# Patient Record
Sex: Male | Born: 1935
Health system: Southern US, Community
[De-identification: ages and names within clinical notes are randomized; demographics above are authoritative.]

## PROBLEM LIST (undated history)

## (undated) DIAGNOSIS — H9190 Unspecified hearing loss, unspecified ear: Secondary | ICD-10-CM

## (undated) DIAGNOSIS — J449 Chronic obstructive pulmonary disease, unspecified: Secondary | ICD-10-CM

## (undated) DIAGNOSIS — Z8546 Personal history of malignant neoplasm of prostate: Secondary | ICD-10-CM

## (undated) DIAGNOSIS — R0902 Hypoxemia: Secondary | ICD-10-CM

## (undated) DIAGNOSIS — K219 Gastro-esophageal reflux disease without esophagitis: Secondary | ICD-10-CM

## (undated) DIAGNOSIS — I639 Cerebral infarction, unspecified: Secondary | ICD-10-CM

## (undated) DIAGNOSIS — M1990 Unspecified osteoarthritis, unspecified site: Secondary | ICD-10-CM

## (undated) DIAGNOSIS — C61 Malignant neoplasm of prostate: Secondary | ICD-10-CM

## (undated) DIAGNOSIS — M359 Systemic involvement of connective tissue, unspecified: Secondary | ICD-10-CM

## (undated) DIAGNOSIS — E785 Hyperlipidemia, unspecified: Secondary | ICD-10-CM

## (undated) DIAGNOSIS — G4700 Insomnia, unspecified: Secondary | ICD-10-CM

## (undated) DIAGNOSIS — C679 Malignant neoplasm of bladder, unspecified: Secondary | ICD-10-CM

## (undated) DIAGNOSIS — C3491 Malignant neoplasm of unspecified part of right bronchus or lung: Secondary | ICD-10-CM

## (undated) HISTORY — DX: Insomnia, unspecified: G47.00

## (undated) HISTORY — DX: Gastro-esophageal reflux disease without esophagitis: K21.9

## (undated) HISTORY — DX: Unspecified osteoarthritis, unspecified site: M19.90

## (undated) HISTORY — DX: Chronic obstructive pulmonary disease, unspecified: J44.9

## (undated) HISTORY — DX: Personal history of malignant neoplasm of prostate: Z85.46

---

## 1963-02-22 HISTORY — PX: APPENDECTOMY: SHX54

## 1963-02-22 HISTORY — PX: HERNIA REPAIR: SHX51

## 2000-02-22 HISTORY — PX: INSERTION PROSTATE RADIATION SEED: SUR718

## 2004-02-22 DIAGNOSIS — C61 Malignant neoplasm of prostate: Secondary | ICD-10-CM

## 2004-02-22 HISTORY — DX: Malignant neoplasm of prostate: C61

## 2004-05-27 ENCOUNTER — Ambulatory Visit: Payer: Self-pay | Admitting: Internal Medicine

## 2004-07-01 ENCOUNTER — Ambulatory Visit: Payer: Self-pay | Admitting: General Surgery

## 2006-02-10 ENCOUNTER — Ambulatory Visit: Payer: Self-pay | Admitting: Family Medicine

## 2006-05-02 ENCOUNTER — Other Ambulatory Visit: Payer: Self-pay

## 2006-05-02 ENCOUNTER — Emergency Department: Payer: Self-pay | Admitting: Emergency Medicine

## 2007-10-02 ENCOUNTER — Other Ambulatory Visit: Payer: Self-pay

## 2007-10-02 ENCOUNTER — Ambulatory Visit: Payer: Self-pay | Admitting: Urology

## 2007-10-08 ENCOUNTER — Ambulatory Visit: Payer: Self-pay | Admitting: Urology

## 2008-02-19 ENCOUNTER — Ambulatory Visit: Payer: Self-pay | Admitting: Urology

## 2008-03-13 ENCOUNTER — Ambulatory Visit: Payer: Self-pay | Admitting: Rheumatology

## 2008-04-10 ENCOUNTER — Ambulatory Visit: Payer: Self-pay | Admitting: Specialist

## 2009-09-07 ENCOUNTER — Ambulatory Visit: Payer: Self-pay | Admitting: Specialist

## 2010-02-17 ENCOUNTER — Ambulatory Visit: Payer: Self-pay | Admitting: Specialist

## 2010-06-15 ENCOUNTER — Ambulatory Visit: Payer: Self-pay | Admitting: Rheumatology

## 2013-11-01 DIAGNOSIS — Z8546 Personal history of malignant neoplasm of prostate: Secondary | ICD-10-CM | POA: Insufficient documentation

## 2014-01-03 ENCOUNTER — Inpatient Hospital Stay: Payer: Self-pay | Admitting: Internal Medicine

## 2014-01-03 LAB — BASIC METABOLIC PANEL
ANION GAP: 3 — AB (ref 7–16)
BUN: 16 mg/dL (ref 7–18)
Calcium, Total: 8.2 mg/dL — ABNORMAL LOW (ref 8.5–10.1)
Chloride: 104 mmol/L (ref 98–107)
Co2: 29 mmol/L (ref 21–32)
Creatinine: 1.13 mg/dL (ref 0.60–1.30)
EGFR (African American): >60
GLUCOSE: 100 mg/dL — AB (ref 65–99)
OSMOLALITY: 273 (ref 275–301)
POTASSIUM: 3.6 mmol/L (ref 3.5–5.1)
SODIUM: 136 mmol/L (ref 136–145)

## 2014-01-03 LAB — CBC
HCT: 39.8 % — ABNORMAL LOW (ref 40.0–52.0)
HGB: 12.9 g/dL — ABNORMAL LOW (ref 13.0–18.0)
MCH: 30.9 pg (ref 26.0–34.0)
MCHC: 32.4 g/dL (ref 32.0–36.0)
MCV: 95 fL (ref 80–100)
PLATELETS: 219 10*3/uL (ref 150–440)
RBC: 4.17 10*6/uL — ABNORMAL LOW (ref 4.40–5.90)
RDW: 15.5 % — ABNORMAL HIGH (ref 11.5–14.5)
WBC: 4.4 10*3/uL (ref 3.8–10.6)

## 2014-01-03 LAB — TROPONIN I: Troponin-I: <0.02 ng/mL

## 2014-01-04 LAB — CBC WITH DIFFERENTIAL/PLATELET
BASOS ABS: 0 10*3/uL (ref 0.0–0.1)
Basophil %: 0 %
EOS PCT: 0 %
Eosinophil #: 0 10*3/uL (ref 0.0–0.7)
HCT: 38.7 % — ABNORMAL LOW (ref 40.0–52.0)
HGB: 12.5 g/dL — ABNORMAL LOW (ref 13.0–18.0)
LYMPHS ABS: 0.4 10*3/uL — AB (ref 1.0–3.6)
Lymphocyte %: 12.1 %
MCH: 30.9 pg (ref 26.0–34.0)
MCHC: 32.4 g/dL (ref 32.0–36.0)
MCV: 96 fL (ref 80–100)
Monocyte #: 0.1 x10 3/mm — ABNORMAL LOW (ref 0.2–1.0)
Monocyte %: 1.8 %
NEUTROS PCT: 86.1 %
Neutrophil #: 2.6 10*3/uL (ref 1.4–6.5)
PLATELETS: 210 10*3/uL (ref 150–440)
RBC: 4.05 10*6/uL — AB (ref 4.40–5.90)
RDW: 15.6 % — AB (ref 11.5–14.5)
WBC: 3 10*3/uL — ABNORMAL LOW (ref 3.8–10.6)

## 2014-01-04 LAB — BASIC METABOLIC PANEL
ANION GAP: 6 — AB (ref 7–16)
BUN: 17 mg/dL (ref 7–18)
CO2: 30 mmol/L (ref 21–32)
Calcium, Total: 8.4 mg/dL — ABNORMAL LOW (ref 8.5–10.1)
Chloride: 102 mmol/L (ref 98–107)
Creatinine: 1.14 mg/dL (ref 0.60–1.30)
EGFR (African American): >60
Glucose: 121 mg/dL — ABNORMAL HIGH (ref 65–99)
Osmolality: 278 (ref 275–301)
Potassium: 4.4 mmol/L (ref 3.5–5.1)
Sodium: 138 mmol/L (ref 136–145)

## 2014-01-05 LAB — CBC WITH DIFFERENTIAL/PLATELET
BASOS ABS: 0 10*3/uL (ref 0.0–0.1)
BASOS PCT: 0 %
EOS PCT: 0 %
Eosinophil #: 0 10*3/uL (ref 0.0–0.7)
HCT: 36.3 % — ABNORMAL LOW (ref 40.0–52.0)
HGB: 12 g/dL — AB (ref 13.0–18.0)
LYMPHS PCT: 6 %
Lymphocyte #: 0.4 10*3/uL — ABNORMAL LOW (ref 1.0–3.6)
MCH: 31.2 pg (ref 26.0–34.0)
MCHC: 33.1 g/dL (ref 32.0–36.0)
MCV: 95 fL (ref 80–100)
MONO ABS: 0.2 x10 3/mm (ref 0.2–1.0)
Monocyte %: 4.2 %
Neutrophil #: 5.3 10*3/uL (ref 1.4–6.5)
Neutrophil %: 89.8 %
Platelet: 226 10*3/uL (ref 150–440)
RBC: 3.84 10*6/uL — ABNORMAL LOW (ref 4.40–5.90)
RDW: 15.3 % — AB (ref 11.5–14.5)
WBC: 5.9 10*3/uL (ref 3.8–10.6)

## 2014-02-25 DIAGNOSIS — M0589 Other rheumatoid arthritis with rheumatoid factor of multiple sites: Secondary | ICD-10-CM | POA: Diagnosis not present

## 2014-02-25 DIAGNOSIS — Z79899 Other long term (current) drug therapy: Secondary | ICD-10-CM | POA: Diagnosis not present

## 2014-03-03 DIAGNOSIS — G4700 Insomnia, unspecified: Secondary | ICD-10-CM | POA: Diagnosis not present

## 2014-03-03 DIAGNOSIS — D473 Essential (hemorrhagic) thrombocythemia: Secondary | ICD-10-CM | POA: Diagnosis not present

## 2014-03-03 DIAGNOSIS — R911 Solitary pulmonary nodule: Secondary | ICD-10-CM | POA: Diagnosis not present

## 2014-03-03 DIAGNOSIS — J441 Chronic obstructive pulmonary disease with (acute) exacerbation: Secondary | ICD-10-CM | POA: Diagnosis not present

## 2014-03-08 DIAGNOSIS — J449 Chronic obstructive pulmonary disease, unspecified: Secondary | ICD-10-CM | POA: Diagnosis not present

## 2014-03-08 DIAGNOSIS — J439 Emphysema, unspecified: Secondary | ICD-10-CM | POA: Diagnosis not present

## 2014-03-24 ENCOUNTER — Ambulatory Visit: Payer: Self-pay | Admitting: Specialist

## 2014-03-24 DIAGNOSIS — R918 Other nonspecific abnormal finding of lung field: Secondary | ICD-10-CM | POA: Diagnosis not present

## 2014-03-24 DIAGNOSIS — I2510 Atherosclerotic heart disease of native coronary artery without angina pectoris: Secondary | ICD-10-CM | POA: Diagnosis not present

## 2014-04-08 DIAGNOSIS — J449 Chronic obstructive pulmonary disease, unspecified: Secondary | ICD-10-CM | POA: Diagnosis not present

## 2014-04-08 DIAGNOSIS — J439 Emphysema, unspecified: Secondary | ICD-10-CM | POA: Diagnosis not present

## 2014-04-29 DIAGNOSIS — M0579 Rheumatoid arthritis with rheumatoid factor of multiple sites without organ or systems involvement: Secondary | ICD-10-CM | POA: Diagnosis not present

## 2014-04-29 DIAGNOSIS — Z79899 Other long term (current) drug therapy: Secondary | ICD-10-CM | POA: Diagnosis not present

## 2014-05-06 DIAGNOSIS — R700 Elevated erythrocyte sedimentation rate: Secondary | ICD-10-CM | POA: Diagnosis not present

## 2014-05-06 DIAGNOSIS — R938 Abnormal findings on diagnostic imaging of other specified body structures: Secondary | ICD-10-CM | POA: Diagnosis not present

## 2014-05-06 DIAGNOSIS — M0579 Rheumatoid arthritis with rheumatoid factor of multiple sites without organ or systems involvement: Secondary | ICD-10-CM | POA: Diagnosis not present

## 2014-05-07 DIAGNOSIS — J449 Chronic obstructive pulmonary disease, unspecified: Secondary | ICD-10-CM | POA: Diagnosis not present

## 2014-05-07 DIAGNOSIS — J439 Emphysema, unspecified: Secondary | ICD-10-CM | POA: Diagnosis not present

## 2014-05-14 DIAGNOSIS — R0602 Shortness of breath: Secondary | ICD-10-CM | POA: Diagnosis not present

## 2014-05-14 DIAGNOSIS — R0902 Hypoxemia: Secondary | ICD-10-CM | POA: Diagnosis not present

## 2014-05-14 DIAGNOSIS — R918 Other nonspecific abnormal finding of lung field: Secondary | ICD-10-CM | POA: Diagnosis not present

## 2014-05-14 DIAGNOSIS — J449 Chronic obstructive pulmonary disease, unspecified: Secondary | ICD-10-CM | POA: Diagnosis not present

## 2014-05-20 DIAGNOSIS — D649 Anemia, unspecified: Secondary | ICD-10-CM | POA: Diagnosis not present

## 2014-05-20 DIAGNOSIS — J449 Chronic obstructive pulmonary disease, unspecified: Secondary | ICD-10-CM | POA: Diagnosis not present

## 2014-05-20 DIAGNOSIS — R911 Solitary pulmonary nodule: Secondary | ICD-10-CM | POA: Diagnosis not present

## 2014-05-20 DIAGNOSIS — K210 Gastro-esophageal reflux disease with esophagitis: Secondary | ICD-10-CM | POA: Diagnosis not present

## 2014-06-06 ENCOUNTER — Other Ambulatory Visit: Payer: Self-pay | Admitting: Specialist

## 2014-06-06 DIAGNOSIS — R911 Solitary pulmonary nodule: Secondary | ICD-10-CM

## 2014-06-07 DIAGNOSIS — J439 Emphysema, unspecified: Secondary | ICD-10-CM | POA: Diagnosis not present

## 2014-06-07 DIAGNOSIS — J449 Chronic obstructive pulmonary disease, unspecified: Secondary | ICD-10-CM | POA: Diagnosis not present

## 2014-06-14 NOTE — Discharge Summary (Signed)
PATIENT NAME:  Collin Gonzales, Collin Gonzales MR#:  [id number] DATE OF BIRTH:  March 02, 1935  ADMITTING DIAGNOSIS: Chronic obstructive pulmonary disease exacerbation.   DISCHARGE DIAGNOSES: 1.  Acute on chronic respiratory failure due to chronic obstructive pulmonary disease exacerbation, now patient will be on continuous oxygen therapy at 2 liters of oxygen through nasal cannula.  2.  Right middle and right upper lobe nodularity on CT scan of the chest. Three month followup was recommended.  3.  Chronic obstructive pulmonary disease exacerbation.  4.  Acute bronchitis.  5.  Leukopenia.  6. Ongoing tobacco abuse. 7.  History of stroke. 8.  History of prostate carcinoma. 9.  Rheumatoid arthritis.   DISCHARGE CONDITION: Stable.   DISCHARGE MEDICATIONS: The patient is to continue methotrexate 2.5 mg 9 tablets once weekly on Tuesdays, omeprazole 40 mg p.o. daily, multivitamins once daily, albuterol 2 puffs 4 times daily as needed, prednisone taper 60 mg p.o. once on 01/07/2014, then taper by 10 mg every 2 days until stopped, albuterol-ipratropium nebulizers 1 inhalation 6 times daily as needed.  Fluticasone-salmeterol 250/50 on puff twice daily, tiotropium 1 inhalation once daily, guaifenesin 600 mg p.o. twice daily, nicotine oral inhaler 1 inhalation every 1-2 hours as needed, nicotine transdermal patch 14 mg topically daily, Levaquin 750 mg p.o. daily for 4 more days. Home oxygen with portable tank at 2 liters of oxygen through nasal cannula continuously.  DIET:  Regular consistency.    ACTIVITY LIMITATIONS: As tolerated.   FOLLOWUP APPOINTMENT:  With Dr. Doreene Nest 2 days after discharge. The patient was also advised to stop smoking.  CONSULTANTS: Care management, social work.  RADIOLOGIC STUDIES: Chest x-ray PA and lateral 01/03/2014, showing COPD changes with bibasilar atelectasis versus scarring. No definite acute abnormalities were found. CT scan of the chest with IV contrast to rule out pulmonary  embolism, 01/05/2014, was negative for pulmonary embolus, severe emphysema with suspicious areas in the right upper lung. Findings could be related to scarring, but cannot exclude slowly enlarging neoplasm in these locations. Recommend close observation of these areas with 3 month followup CT scan of the chest. In addition, there are a few nodules or a bilobed nodule in the right middle lobe which is at least partially calcified and could represent calcified granuloma, mild wall thickening in the distal esophagus is nonspecific, esophagitis cannot be excluded. Stable wall thickening along the left side of the aortic arch which is unchanged.   HISTORY: The patient is a 79 year old African American male with history of smoking, who presents to the hospital with complaints of shortness of breath as well as cough. Please refer to Dr. Loistine Chance admission note on 01/03/2014. On arrival to the hospital, the patient with complaining of cough with clear phlegm and he was noted to be wheezing and had decreased exercise tolerance.  His vital signs on the day of arrival, temperature was 98.5, pulse was 71. Respirate rate was 20, blood pressure 141/82, saturation was 95% on 2 liters of oxygen through nasal cannula. Physical exam found him to be slightly tachypneic, diffuse loud wheezes as well as rhonchi were found to be throughout and poor air movement, but not overt respiratory distress. Otherwise, physical exam was unremarkable.   LABORATORY DATA:  Done on arrival to the hospital, BMP, glucose level of 100, otherwise unremarkable. CO2 level was 29. Troponin was less than 0.02. White blood cell count was normal at 4.4, hemoglobin was 12.9, platelet count was 219,000.   EKG showed normal sinus rhythm at 75 beats per minute, nonspecific  T wave abnormality, but no acute ST-T changes were noted. Chest x-ray was unremarkable except for mild bibasilar atelectasis.   HOSPITAL COURSE The patient was admitted to the hospital for  further evaluation. He was initiated on antibiotics, steroids, inhalation nebulizer therapy, as well as oxygen. With this, his condition improved; however, he still continued to have significant hypoxia with oxygen saturations dropping down to 85%-83% on room air on exertion. CT scan of the chest was performed with IV contrast to rule out pulmonary embolism which showed no pulmonary embolism; however, severe emphysema and some pulmonary nodularity. The patient was advised to continue antibiotic therapy as well as nebulizers and inhalers and he was felt to have acute on likely chronic respiratory failure and chronic hypoxia and so oxygen therapy was initiated for him at home. The patient was advised to continue 2 liters of oxygen per nasal cannula and follow up with his primary physician for further recommendations.  In regard to lung nodularity, a CT scan of the chest is recommended to be repeated in 3 months after discharge.   The patient was noted to be leukopenic while in the hospital with a white blood cell count dropping down briefly on 01/04/2014 to 3.0 and a hemoglobin level of 12.5, as well as platelet count of 210,000.  The patient's white blood cell count recovered the next day back to normal. The patient's absolute neutrophil count was normal despite him being leukopenic overall with absolute neutrophil count being 2.6. The lymphocyte count was found to be low at 0.4. It is unclear why the patient had this episode of leukopenia, but the patient may benefit from HIV testing as outpatient which was not done during this admission.  Today on 01/06/2014, the patient is felt to be stable to be discharged home. On the day of discharge, temperature is 98.6, pulse was 75, respirations were 18, blood pressure 131/80, saturation was 90% on 2 liters of oxygen through nasal cannula on exertion and 82%  with exertion on room air and 92% on room air at rest.   TIME SPENT: 40 minutes with the  patient.   ____________________________ Collin Grist, MD rv:LT D: 01/06/2014 20:18:00 ET T: 01/06/2014 20:53:04 ET JOB#: [id number]  cc: DR. Jeronimo Greaves, MD, <Dictator>  Elery Cadenhead MD ELECTRONICALLY SIGNED 01/20/2014 20:59

## 2014-06-14 NOTE — H&P (Signed)
PATIENT NAME:  Collin Gonzales, Collin Gonzales MR#:  [id number] DATE OF BIRTH:  02-25-1935  DATE OF ADMISSION:  01/03/2014  PRIMARY CARE PHYSICIAN: Dr. Manuella Ghazi with [facility].   PRIMARY PULMONOLOGIST: Dr. Raul Del.   REFERRING EMERGENCY ROOM PHYSICIAN: Dr. Thomasene Lot.   CHIEF COMPLAINT: Shortness of breath.   HISTORY OF PRESENT ILLNESS: This very pleasant 79 year old man with past medical history of COPD and rheumatoid arthritis presents with 1 week of progressive shortness of breath and malaise. He denies fevers or chills. No nausea, vomiting, or diarrhea. He has had a cough with clear sputum. He has noticed wheezing and decreased exercise tolerance. He cannot walk more than 5 feet at this point without becoming acutely short of breath. He does not use oxygen at home. He does continue to smoke cigarettes.   PAST MEDICAL HISTORY: 1.  Chronic obstructive pulmonary disease.  2.  History of CVA in 2000 with residual left foot spasm, otherwise no other defects.  3.  Prostate cancer, in remission.  4.  Rheumatoid arthritis.   SOCIAL HISTORY: The patient lives alone. He continues to smoke 1 pack of cigarettes per day. He has about a 50 pack-year smoking history. He does not drink alcohol or use any illicit substances. He does not use a cane or walker for ambulation. He performs all of his own activities of daily living.   FAMILY HISTORY: Negative for coronary artery disease or stroke. He has a sister with breast cancer.   ALLERGIES: No known allergies.   HOME MEDICATIONS: 1.  Omeprazole 40 mg daily.  2.  Multivitamin 1 tablet daily.  3.  Methotrexate 2.5 mg, 9 tablets once a week.  4.  Albuterol CFC free 90 mcg/inhalation aerosol 2 puffs inhaled 4 times a day as needed for shortness of breath.   REVIEW OF SYSTEMS: GENERAL: Negative for fevers, chills, or weight change. Positive for weakness and fatigue.  HEENT: No change in hearing or vision. No pain in eyes or ears. No pain in the throat or  difficulty swallowing.  PULMONARY: Positive for shortness of breath, wheezing, cough productive of sputum. No hemoptysis. No pleuritic type chest pain. Positive for history of COPD.   CARDIOVASCULAR: No chest pain, edema, palpitations, syncope.  GASTROINTESTINAL: No nausea, vomiting, diarrhea. No abdominal pain.  GENITOURINARY: No dysuria or frequency.  MUSCULOSKELETAL: No new pain in the neck, back, shoulders, knees, or hips. No swollen tender joints. No cramping or gout.  NEUROLOGIC: No confusion, dementia, headache, seizure. He does have a remote history of CVA.  PSYCHIATRIC: No uncontrolled depression or anxiety.   PHYSICAL EXAMINATION: VITAL SIGNS: Temperature 98.5, pulse 71, respirations 20, blood pressure 141/82, pulse oximetry 95% on 2 liters nasal cannula.  GENERAL: No acute distress, resting in the exam bed, slightly tachypneic.   HEENT: Pupils equal, round, and reactive to light. Extraocular motion intact. Conjunctivae clear. Oral mucous membranes are dry. Good dentition. Posterior oropharynx is clear with no edema or exudate. He has shotty anterior cervical lymphadenopathy bilaterally. Trachea is midline. Thyroid is nontender.  PULMONARY: Diffuse loud wheezes and rhonchi throughout. Poor air movement. No overt respiratory distress.  CARDIOVASCULAR: Heart sounds are very distant. Regular rate and rhythm. No murmurs, rubs, or gallops. No peripheral edema. Peripheral pulses are 1+.  ABDOMEN: Soft, nontender, nondistended. Bowel sounds are normal.  MUSCULOSKELETAL: No joint effusions. Range of motion is normal. Strength is 5/5 throughout.  NEUROLOGIC: Cranial nerves II through XII are intact. Strength and sensation intact, nonfocal.  PSYCHIATRIC: The patient is alert and  oriented x 4. Has good insight into his clinical condition. No signs of uncontrolled depression or anxiety.   LABORATORY DATA: Sodium 136, potassium 3.6, chloride 104, bicarbonate 29, BUN 16, creatinine 1.13, glucose 100.  Troponin less than 0.02. White blood cells 4.4, hemoglobin 12.9, platelets 219,000, MCV 95.   IMAGING: Chest x-ray shows COPD changes with bibasilar atelectasis versus scarring. No definite acute abnormality. Also bone mineralization.   ASSESSMENT AND PLAN: 1.  Chronic obstructive pulmonary disease exacerbation: We will admit and treat with standard regimen of Solu-Medrol 60 mg IV daily, albuterol and ipratropium nebulizers, doxycycline 100 mg b.i.d. He will need to be assessed for need for home oxygen. He may need maintenance inhalers as well as p.r.n. albuterol upon discharge. We will likely discharge on Advair and/or Spiriva.   2.  Acute respiratory failure with hypoxia: Requiring 2 liters nasal cannula to maintain oxygen saturations above 90. Continue supplemental oxygen. We will need to be assessed prior to discharge for home oxygen need.  3.  History of cerebrovascular accident: No signs of neurologic deficit at this point. The patient may need to be on aspirin daily for secondary risk prevention.  4.  Rheumatoid arthritis: We will hold methotrexate while inpatient.  5.  Deep vein thrombosis prophylaxis with heparin, gastrointestinal prophylaxis with pantoprazole.  6.  Disposition: I have discussed code status with this patient and he would like to be a full code.   TIME SPENT ON ADMISSION: 40 minutes.   ____________________________ Earleen Newport. Volanda Napoleon, MD cpw:at D: 01/03/2014 20:19:51 ET T: 01/03/2014 20:57:25 ET JOB#: [id number]  cc: Earleen Newport. Volanda Napoleon, MD, <Dictator> Aldean Jewett MD ELECTRONICALLY SIGNED 01/09/2014 13:19

## 2014-07-06 DIAGNOSIS — J439 Emphysema, unspecified: Secondary | ICD-10-CM | POA: Diagnosis not present

## 2014-07-06 DIAGNOSIS — J449 Chronic obstructive pulmonary disease, unspecified: Secondary | ICD-10-CM | POA: Diagnosis not present

## 2014-07-07 DIAGNOSIS — J449 Chronic obstructive pulmonary disease, unspecified: Secondary | ICD-10-CM | POA: Diagnosis not present

## 2014-07-07 DIAGNOSIS — J439 Emphysema, unspecified: Secondary | ICD-10-CM | POA: Diagnosis not present

## 2014-07-18 DIAGNOSIS — R0609 Other forms of dyspnea: Secondary | ICD-10-CM | POA: Diagnosis not present

## 2014-07-18 DIAGNOSIS — J432 Centrilobular emphysema: Secondary | ICD-10-CM | POA: Diagnosis not present

## 2014-07-18 DIAGNOSIS — R0902 Hypoxemia: Secondary | ICD-10-CM | POA: Diagnosis not present

## 2014-08-06 DIAGNOSIS — R700 Elevated erythrocyte sedimentation rate: Secondary | ICD-10-CM | POA: Diagnosis not present

## 2014-08-06 DIAGNOSIS — M0579 Rheumatoid arthritis with rheumatoid factor of multiple sites without organ or systems involvement: Secondary | ICD-10-CM | POA: Diagnosis not present

## 2014-08-07 DIAGNOSIS — J439 Emphysema, unspecified: Secondary | ICD-10-CM | POA: Diagnosis not present

## 2014-08-07 DIAGNOSIS — J449 Chronic obstructive pulmonary disease, unspecified: Secondary | ICD-10-CM | POA: Diagnosis not present

## 2014-08-20 ENCOUNTER — Ambulatory Visit (INDEPENDENT_AMBULATORY_CARE_PROVIDER_SITE_OTHER): Payer: Commercial Managed Care - HMO | Admitting: Family Medicine

## 2014-08-20 ENCOUNTER — Encounter: Payer: Self-pay | Admitting: Family Medicine

## 2014-08-20 VITALS — BP 128/68 | HR 89 | Temp 98.6°F | Ht 68.0 in | Wt 136.0 lb

## 2014-08-20 DIAGNOSIS — Z0000 Encounter for general adult medical examination without abnormal findings: Secondary | ICD-10-CM | POA: Insufficient documentation

## 2014-08-20 DIAGNOSIS — F5104 Psychophysiologic insomnia: Secondary | ICD-10-CM

## 2014-08-20 DIAGNOSIS — C3490 Malignant neoplasm of unspecified part of unspecified bronchus or lung: Secondary | ICD-10-CM | POA: Insufficient documentation

## 2014-08-20 DIAGNOSIS — G4700 Insomnia, unspecified: Secondary | ICD-10-CM | POA: Diagnosis not present

## 2014-08-20 DIAGNOSIS — J441 Chronic obstructive pulmonary disease with (acute) exacerbation: Secondary | ICD-10-CM | POA: Insufficient documentation

## 2014-08-20 DIAGNOSIS — E785 Hyperlipidemia, unspecified: Secondary | ICD-10-CM | POA: Insufficient documentation

## 2014-08-20 DIAGNOSIS — M069 Rheumatoid arthritis, unspecified: Secondary | ICD-10-CM | POA: Insufficient documentation

## 2014-08-20 DIAGNOSIS — J449 Chronic obstructive pulmonary disease, unspecified: Secondary | ICD-10-CM | POA: Insufficient documentation

## 2014-08-20 DIAGNOSIS — G479 Sleep disorder, unspecified: Secondary | ICD-10-CM | POA: Insufficient documentation

## 2014-08-20 MED ORDER — TRAZODONE HCL 50 MG PO TABS: 50.0000 mg | ORAL_TABLET | Freq: Every day | ORAL | Status: DC

## 2014-08-20 NOTE — Progress Notes (Deleted)
Name: Collin Gonzales   MRN: [id number]    DOB: 1935/06/11   Date:08/20/2014       Progress Note  Subjective  Chief Complaint  Chief Complaint  Patient presents with  . Annual Exam    checkup    HPI  Patient is here today for a Medicare Wellness Visit:  The patient has been in otherwise good general health and voices no acute concerns today.   No problem-specific assessment & plan notes found for this encounter.   Past Medical History  Diagnosis Date  . Arthritis   . GERD (gastroesophageal reflux disease)     History reviewed. No pertinent past surgical history.  History reviewed. No pertinent family history.  History   Social History  . Marital Status: Single    Spouse Name: N/A  . Number of Children: N/A  . Years of Education: N/A   Occupational History  . Not on file.   Social History Main Topics  . Smoking status: Former Research scientist (life sciences)  . Smokeless tobacco: Not on file  . Alcohol Use: No  . Drug Use: No  . Sexual Activity: Not on file   Other Topics Concern  . Not on file   Social History Narrative  . No narrative on file     Current outpatient prescriptions:  .  albuterol (PROAIR HFA) 108 (90 BASE) MCG/ACT inhaler, Inhale into the lungs., Disp: , Rfl:  .  Fluticasone-Salmeterol (ADVAIR DISKUS) 250-50 MCG/DOSE AEPB, Inhale into the lungs., Disp: , Rfl:  .  tiotropium (SPIRIVA HANDIHALER) 18 MCG inhalation capsule, Place into inhaler and inhale., Disp: , Rfl:  .  traZODone (DESYREL) 50 MG tablet, Take by mouth., Disp: , Rfl:  .  B COMPLEX-C PO, Take by mouth., Disp: , Rfl:  .  methotrexate 2.5 MG tablet, Take by mouth., Disp: , Rfl:  .  omeprazole (PRILOSEC) 40 MG capsule, Take by mouth., Disp: , Rfl:  .  sildenafil (VIAGRA) 50 MG tablet, Take by mouth., Disp: , Rfl:   Allergies  Allergen Reactions  . Hydroxychloroquine Sulfate     Other reaction(s): Dizziness    Fall Risk: Fall Risk  08/20/2014  Falls in the past year? No    Depression  screen PHQ 2/9 08/20/2014  Decreased Interest 0  Down, Depressed, Hopeless 0  PHQ - 2 Score 0   Functional Status Survey: Is the patient deaf or have difficulty hearing?: No Does the patient have difficulty seeing, even when wearing glasses/contacts?: No Does the patient have difficulty concentrating, remembering, or making decisions?: No Does the patient have difficulty walking or climbing stairs?: No Does the patient have difficulty dressing or bathing?: No Does the patient have difficulty doing errands alone such as visiting a doctor's office or shopping?: No   ROS  CONSTITUTIONAL: No significant weight changes, fever, chills, weakness or fatigue.  HEENT:  - Eyes: No visual changes.  - Ears: No auditory changes. No pain.  - Nose: No sneezing, congestion, runny nose. - Throat: No sore throat. No changes in swallowing. SKIN: No rash or itching.  CARDIOVASCULAR: No chest pain, chest pressure or chest discomfort. No palpitations or edema.  RESPIRATORY: No shortness of breath, cough or sputum.  GASTROINTESTINAL: No anorexia, nausea, vomiting. No changes in bowel habits. No abdominal pain or blood.  GENITOURINARY: No dysuria. No frequency. No discharge.  NEUROLOGICAL: No headache, dizziness, syncope, paralysis, ataxia, numbness or tingling in the extremities. No memory changes. No change in bowel or bladder control.  MUSCULOSKELETAL: No joint pain.  No muscle pain. HEMATOLOGIC: No anemia, bleeding or bruising.  LYMPHATICS: No enlarged lymph nodes.  PSYCHIATRIC: No change in mood. No change in sleep pattern.  ENDOCRINOLOGIC: No reports of sweating, cold or heat intolerance. No polyuria or polydipsia.   Objective  Filed Vitals:   08/20/14 1405  BP: 128/68  Pulse: 89  Temp: 98.6 F (37 C)  TempSrc: Oral  Height: '5\' 8"'$  (1.727 m)  Weight: 136 lb (61.689 kg)  SpO2: 90%   Body mass index is 20.68 kg/(m^2).  Physical Exam  Constitutional: Patient appears well-developed and  well-nourished. In no distress.  HEENT:  - Head: Normocephalic and atraumatic.  - Ears: Bilateral TMs gray, no erythema or effusion - Nose: Nasal mucosa moist - Mouth/Throat: Oropharynx is clear and moist. No tonsillar hypertrophy or erythema. No post nasal drainage.  - Eyes: Conjunctivae clear, EOM movements normal. PERRLA. No scleral icterus.  Neck: Normal range of motion. Neck supple. No JVD present. No thyromegaly present.  Cardiovascular: Normal rate, regular rhythm and normal heart sounds.  No murmur heard.  Pulmonary/Chest: Effort normal and breath sounds normal. No respiratory distress. Musculoskeletal: Normal range of motion bilateral UE and LE, no joint effusions. Peripheral vascular: Bilateral LE no edema. Neurological: CN II-XII grossly intact with no focal deficits. Alert and oriented to person, place, and time. Coordination, balance, strength, speech and gait are normal.  Skin: Skin is warm and dry. No rash noted. No erythema.  Psychiatric: Patient has a normal mood and affect. Behavior is normal in office today. Judgment and thought content normal in office today.   Assessment & Plan  Functional ability/safety issues: No Issues Hearing issues: Addressed  Activities of daily living: Discussed Home safety issues: No Issues  End Of Life Planning: Offered verbal information regarding advanced directives, healthcare power of attorney.  Preventative care, Health maintenance, Preventative health measures discussed.  Preventative screenings discussed today: lab work, colonoscopy, PSA.  Men age 56 to 14 years if ever smoked recommended to get a one time AAA ultrasound screening exam.  Low Dose CT Chest recommended if Age 19-80 years, 30 pack-year currently smoking OR have quit w/in 15years.   Lifestyle risk factor issued reviewed: Diet, exercise, weight management, advised patient smoking is not healthy, nutrition/diet.  Preventative health measures discussed (5-10 year  plan).  Reviewed and recommended vaccinations: - Pneumovax  - Prevnar  - Annual Influenza - Zostavax - Tdap   Depression screening: Done Fall risk screening: Done Discuss ADLs/IADLs: Done  Current medical providers: See HPI  Other health risk factors identified this visit: No other issues Cognitive impairment issues: None identified  All above discussed with patient. Appropriate education, counseling and referral will be made based upon the above.

## 2014-08-20 NOTE — Progress Notes (Signed)
Name: Collin Gonzales   MRN: [id number]    DOB: [birth year]   Date:08/20/2014       Progress Note  Subjective  Chief Complaint  Chief Complaint  Patient presents with  . Insomnia    HPI Pt. Is here for follow up of insomnia. Symptoms include difficulty falling asleep and waking up in the middle of the night. He is on Trazodone 50 mg at bedtime, which seems to be helping with symptoms of insomnia. No side effects noted. Requesting refill for trazodone   Past Medical History  Diagnosis Date  . GERD (gastroesophageal reflux disease)   . COPD (chronic obstructive pulmonary disease)   . Arthritis     rheumatoid arthritis  . Insomnia     Past Surgical History  Procedure Laterality Date  . Appendectomy  1965  . Hernia repair  1965  . Biopsy breast Right 2006    Family History  Problem Relation Age of Onset  . Cancer Mother   . Cancer Sister   . Cancer Brother     History   Social History  . Marital Status: Single    Spouse Name: N/A  . Number of Children: N/A  . Years of Education: N/A   Occupational History  . Not on file.   Social History Main Topics  . Smoking status: Former Research scientist (life sciences)  . Smokeless tobacco: Not on file  . Alcohol Use: No  . Drug Use: No  . Sexual Activity: Not on file   Other Topics Concern  . Not on file   Social History Narrative  . No narrative on file     Current outpatient prescriptions:  .  albuterol (PROAIR HFA) 108 (90 BASE) MCG/ACT inhaler, Inhale into the lungs., Disp: , Rfl:  .  Fluticasone-Salmeterol (ADVAIR DISKUS) 250-50 MCG/DOSE AEPB, Inhale into the lungs., Disp: , Rfl:  .  tiotropium (SPIRIVA HANDIHALER) 18 MCG inhalation capsule, Place into inhaler and inhale., Disp: , Rfl:  .  traZODone (DESYREL) 50 MG tablet, Take by mouth., Disp: , Rfl:  .  B COMPLEX-C PO, Take by mouth., Disp: , Rfl:  .  methotrexate 2.5 MG tablet, Take by mouth., Disp: , Rfl:  .  omeprazole (PRILOSEC) 40 MG capsule, Take by mouth., Disp: , Rfl:   .  sildenafil (VIAGRA) 50 MG tablet, Take by mouth., Disp: , Rfl:   Allergies  Allergen Reactions  . Hydroxychloroquine Sulfate     Other reaction(s): Dizziness     Review of Systems  Psychiatric/Behavioral: Negative for depression and memory loss. The patient has insomnia. The patient is not nervous/anxious.       Objective  Filed Vitals:   08/20/14 1405  BP: 128/68  Pulse: 89  Temp: 98.6 F (37 C)  TempSrc: Oral  Height: '5\' 8"'$  (1.727 m)  Weight: 136 lb (61.689 kg)  SpO2: 90%    Physical Exam  Constitutional: He is oriented to person, place, and time and well-developed, well-nourished, and in no distress.  Cardiovascular: Normal rate and regular rhythm.   Pulmonary/Chest: Effort normal. He has rales in the right lower field and the left lower field.  Neurological: He is alert and oriented to person, place, and time.  Skin: Skin is warm.  Psychiatric: Mood, memory, affect and judgment normal.  Nursing note and vitals reviewed.      Assessment & Plan 1. Chronic insomnia Stable and responsive to present therapy. - traZODone (DESYREL) 50 MG tablet; Take 1 tablet (50 mg total) by mouth at bedtime.  Dispense: 90 tablet; Refill: 1  There are no diagnoses linked to this encounter.  Dameer Speiser [facility] 08/20/2014 2:47 PM

## 2014-09-06 DIAGNOSIS — J439 Emphysema, unspecified: Secondary | ICD-10-CM | POA: Diagnosis not present

## 2014-09-06 DIAGNOSIS — J449 Chronic obstructive pulmonary disease, unspecified: Secondary | ICD-10-CM | POA: Diagnosis not present

## 2014-09-10 DIAGNOSIS — D400 Neoplasm of uncertain behavior of prostate: Secondary | ICD-10-CM | POA: Diagnosis not present

## 2014-09-10 DIAGNOSIS — N5201 Erectile dysfunction due to arterial insufficiency: Secondary | ICD-10-CM | POA: Diagnosis not present

## 2014-09-10 DIAGNOSIS — R350 Frequency of micturition: Secondary | ICD-10-CM | POA: Diagnosis not present

## 2014-10-07 DIAGNOSIS — J449 Chronic obstructive pulmonary disease, unspecified: Secondary | ICD-10-CM | POA: Diagnosis not present

## 2014-10-07 DIAGNOSIS — J439 Emphysema, unspecified: Secondary | ICD-10-CM | POA: Diagnosis not present

## 2014-10-30 DIAGNOSIS — R700 Elevated erythrocyte sedimentation rate: Secondary | ICD-10-CM | POA: Diagnosis not present

## 2014-10-30 DIAGNOSIS — M0579 Rheumatoid arthritis with rheumatoid factor of multiple sites without organ or systems involvement: Secondary | ICD-10-CM | POA: Diagnosis not present

## 2014-11-06 DIAGNOSIS — M0579 Rheumatoid arthritis with rheumatoid factor of multiple sites without organ or systems involvement: Secondary | ICD-10-CM | POA: Diagnosis not present

## 2014-11-06 DIAGNOSIS — R938 Abnormal findings on diagnostic imaging of other specified body structures: Secondary | ICD-10-CM | POA: Diagnosis not present

## 2014-11-06 DIAGNOSIS — R700 Elevated erythrocyte sedimentation rate: Secondary | ICD-10-CM | POA: Diagnosis not present

## 2014-11-06 DIAGNOSIS — C61 Malignant neoplasm of prostate: Secondary | ICD-10-CM | POA: Diagnosis not present

## 2014-11-06 DIAGNOSIS — Z79899 Other long term (current) drug therapy: Secondary | ICD-10-CM | POA: Diagnosis not present

## 2014-11-07 DIAGNOSIS — J449 Chronic obstructive pulmonary disease, unspecified: Secondary | ICD-10-CM | POA: Diagnosis not present

## 2014-11-07 DIAGNOSIS — J439 Emphysema, unspecified: Secondary | ICD-10-CM | POA: Diagnosis not present

## 2014-11-21 ENCOUNTER — Encounter: Payer: Self-pay | Admitting: Family Medicine

## 2014-11-21 ENCOUNTER — Ambulatory Visit (INDEPENDENT_AMBULATORY_CARE_PROVIDER_SITE_OTHER): Payer: Commercial Managed Care - HMO | Admitting: Family Medicine

## 2014-11-21 VITALS — BP 120/78 | HR 86 | Temp 97.9°F | Resp 19 | Ht 68.0 in | Wt 131.5 lb

## 2014-11-21 DIAGNOSIS — G4700 Insomnia, unspecified: Secondary | ICD-10-CM | POA: Diagnosis not present

## 2014-11-21 DIAGNOSIS — Z0000 Encounter for general adult medical examination without abnormal findings: Secondary | ICD-10-CM

## 2014-11-21 DIAGNOSIS — Z23 Encounter for immunization: Secondary | ICD-10-CM | POA: Diagnosis not present

## 2014-11-21 DIAGNOSIS — F5104 Psychophysiologic insomnia: Secondary | ICD-10-CM

## 2014-11-21 MED ORDER — TRAZODONE HCL 50 MG PO TABS: 50.0000 mg | ORAL_TABLET | Freq: Every day | ORAL | Status: DC

## 2014-11-21 NOTE — Progress Notes (Signed)
Name: Collin Gonzales   MRN: [id number]    DOB: 01-25-1936   Date:11/21/2014       Progress Note  Subjective  Chief Complaint  Chief Complaint  Patient presents with  . Annual Exam    CPE  . Hyperlipidemia  . Insomnia    HPI Pt. Is here for Physical exam. He voices no problems today. Chronic medical conditions stable and followed by specialists.  Past Medical History  Diagnosis Date  . GERD (gastroesophageal reflux disease)   . COPD (chronic obstructive pulmonary disease)   . Arthritis     rheumatoid arthritis  . Insomnia   . History of prostate cancer     Past Surgical History  Procedure Laterality Date  . Appendectomy  1965  . Hernia repair  1965  . Biopsy breast Right 2006    Family History  Problem Relation Age of Onset  . Cancer Mother   . Cancer Sister   . Cancer Brother   . Cancer Father     Lung Cancer    Social History   Social History  . Marital Status: Single    Spouse Name: N/A  . Number of Children: N/A  . Years of Education: N/A   Occupational History  . Not on file.   Social History Main Topics  . Smoking status: Former Smoker    Quit date: 02/21/2013  . Smokeless tobacco: Not on file  . Alcohol Use: No  . Drug Use: No  . Sexual Activity: Not on file   Other Topics Concern  . Not on file   Social History Narrative     Current outpatient prescriptions:  .  albuterol (PROAIR HFA) 108 (90 BASE) MCG/ACT inhaler, Inhale into the lungs., Disp: , Rfl:  .  B COMPLEX-C PO, Take by mouth., Disp: , Rfl:  .  Fluticasone-Salmeterol (ADVAIR DISKUS) 250-50 MCG/DOSE AEPB, Inhale into the lungs., Disp: , Rfl:  .  folic acid (FOLVITE) 1 MG tablet, Take by mouth., Disp: , Rfl:  .  methotrexate 2.5 MG tablet, Take by mouth., Disp: , Rfl:  .  omeprazole (PRILOSEC) 40 MG capsule, Take by mouth., Disp: , Rfl:  .  sildenafil (VIAGRA) 50 MG tablet, Take by mouth., Disp: , Rfl:  .  tiotropium (SPIRIVA HANDIHALER) 18 MCG inhalation capsule, Place  into inhaler and inhale., Disp: , Rfl:  .  traZODone (DESYREL) 50 MG tablet, Take 1 tablet (50 mg total) by mouth at bedtime., Disp: 90 tablet, Rfl: 1  Allergies  Allergen Reactions  . Hydroxychloroquine Sulfate Other (See Comments)    Other reaction(s): Dizziness   Review of Systems  Constitutional: Negative for fever, chills, weight loss and malaise/fatigue.  HENT: Positive for congestion and hearing loss (decreased hearing sometimes). Negative for ear pain and sore throat.   Eyes: Negative for blurred vision, double vision and pain.  Respiratory: Positive for cough, sputum production, shortness of breath and wheezing.   Cardiovascular: Negative for chest pain, palpitations and leg swelling.  Gastrointestinal: Negative for heartburn, nausea, vomiting, abdominal pain, diarrhea and blood in stool.  Genitourinary: Negative for dysuria, frequency and hematuria.  Musculoskeletal: Negative for myalgias and back pain.  Skin: Negative for itching and rash.  Neurological: Negative for dizziness and headaches.  Psychiatric/Behavioral: Negative for depression. The patient has insomnia. The patient is not nervous/anxious.    Objective  Filed Vitals:   11/21/14 0929  BP: 120/78  Pulse: 86  Temp: 97.9 F (36.6 C)  TempSrc: Oral  Resp: 19  Height: '5\' 8"'$  (1.727 m)  Weight: 131 lb 8 oz (59.648 kg)  SpO2: 93%    Physical Exam  Constitutional: He is oriented to person, place, and time and well-developed, well-nourished, and in no distress.  HENT:  Head: Normocephalic and atraumatic.  Eyes: Conjunctivae are normal. Pupils are equal, round, and reactive to light.  Neck: Normal range of motion.  Cardiovascular: Normal rate and regular rhythm.   Pulmonary/Chest: Effort normal and breath sounds normal.  Abdominal: Soft. Bowel sounds are normal.  Genitourinary:   Deferred for urology.  Musculoskeletal: Normal range of motion.  Neurological: He is alert and oriented to person, place, and  time.  Skin: Skin is warm and dry.  Psychiatric: Mood, memory, affect and judgment normal.  Nursing note and vitals reviewed.  Assessment & Plan  1. Need for immunization against influenza  - Flu vaccine HIGH DOSE PF (Fluzone High dose)  2. Annual physical exam  Pt.'s last colonoscopy was 10 years ago and he is not wish to be referred for the same at this time.obtain routine annual labs and follow-up  - CBC with Differential - Comprehensive Metabolic Panel (CMET) - Lipid Profile - TSH - Vitamin D (25 hydroxy)  3. Chronic insomnia  - traZODone (DESYREL) 50 MG tablet; Take 1 tablet (50 mg total) by mouth at bedtime.  Dispense: 90 tablet; Refill: 1     Syed Asad A. [facility] 11/21/2014 9:47 AM

## 2014-11-22 LAB — COMPREHENSIVE METABOLIC PANEL
A/G RATIO: 0.8 — AB (ref 1.1–2.5)
ALBUMIN: 3.6 g/dL (ref 3.5–4.8)
ALT: 7 IU/L (ref 0–44)
AST: 19 IU/L (ref 0–40)
Alkaline Phosphatase: 107 IU/L (ref 39–117)
BUN / CREAT RATIO: 15 (ref 10–22)
BUN: 19 mg/dL (ref 8–27)
Bilirubin Total: 0.3 mg/dL (ref 0.0–1.2)
CALCIUM: 9.4 mg/dL (ref 8.6–10.2)
CO2: 23 mmol/L (ref 18–29)
Chloride: 100 mmol/L (ref 97–108)
Creatinine, Ser: 1.25 mg/dL (ref 0.76–1.27)
GFR, EST AFRICAN AMERICAN: 64 mL/min/{1.73_m2} (ref >59)
GFR, EST NON AFRICAN AMERICAN: 56 mL/min/{1.73_m2} — AB (ref >59)
GLOBULIN, TOTAL: 4.7 g/dL — AB (ref 1.5–4.5)
Glucose: 87 mg/dL (ref 65–99)
POTASSIUM: 4.8 mmol/L (ref 3.5–5.2)
Sodium: 139 mmol/L (ref 134–144)
TOTAL PROTEIN: 8.3 g/dL (ref 6.0–8.5)

## 2014-11-22 LAB — LIPID PANEL
CHOL/HDL RATIO: 3.4 ratio (ref 0.0–5.0)
Cholesterol, Total: 155 mg/dL (ref 100–199)
HDL: 45 mg/dL (ref >39)
LDL Calculated: 98 mg/dL (ref 0–99)
Triglycerides: 62 mg/dL (ref 0–149)
VLDL Cholesterol Cal: 12 mg/dL (ref 5–40)

## 2014-11-22 LAB — CBC WITH DIFFERENTIAL/PLATELET
Basophils Absolute: 0 10*3/uL (ref 0.0–0.2)
Basos: 0 %
EOS (ABSOLUTE): 0.1 10*3/uL (ref 0.0–0.4)
Eos: 2 %
Hematocrit: 39.3 % (ref 37.5–51.0)
Hemoglobin: 12.3 g/dL — ABNORMAL LOW (ref 12.6–17.7)
IMMATURE GRANULOCYTES: 0 %
Immature Grans (Abs): 0 10*3/uL (ref 0.0–0.1)
Lymphocytes Absolute: 1.7 10*3/uL (ref 0.7–3.1)
Lymphs: 31 %
MCH: 29.6 pg (ref 26.6–33.0)
MCHC: 31.3 g/dL — ABNORMAL LOW (ref 31.5–35.7)
MCV: 95 fL (ref 79–97)
Monocytes Absolute: 0.5 10*3/uL (ref 0.1–0.9)
Monocytes: 9 %
NEUTROS PCT: 58 %
Neutrophils Absolute: 3.2 10*3/uL (ref 1.4–7.0)
PLATELETS: 387 10*3/uL — AB (ref 150–379)
RBC: 4.16 x10E6/uL (ref 4.14–5.80)
RDW: 16.1 % — AB (ref 12.3–15.4)
WBC: 5.6 10*3/uL (ref 3.4–10.8)

## 2014-11-22 LAB — VITAMIN D 25 HYDROXY (VIT D DEFICIENCY, FRACTURES): Vit D, 25-Hydroxy: 37 ng/mL (ref 30.0–100.0)

## 2014-11-22 LAB — TSH: TSH: 1.29 u[IU]/mL (ref 0.450–4.500)

## 2014-12-01 DIAGNOSIS — H2513 Age-related nuclear cataract, bilateral: Secondary | ICD-10-CM | POA: Diagnosis not present

## 2014-12-07 DIAGNOSIS — J439 Emphysema, unspecified: Secondary | ICD-10-CM | POA: Diagnosis not present

## 2014-12-07 DIAGNOSIS — J449 Chronic obstructive pulmonary disease, unspecified: Secondary | ICD-10-CM | POA: Diagnosis not present

## 2014-12-08 ENCOUNTER — Encounter: Payer: Self-pay | Admitting: Family Medicine

## 2014-12-08 ENCOUNTER — Ambulatory Visit (INDEPENDENT_AMBULATORY_CARE_PROVIDER_SITE_OTHER): Payer: Commercial Managed Care - HMO | Admitting: Family Medicine

## 2014-12-08 VITALS — BP 124/78 | HR 86 | Temp 98.4°F | Resp 19 | Ht 68.0 in | Wt 133.3 lb

## 2014-12-08 DIAGNOSIS — Z9189 Other specified personal risk factors, not elsewhere classified: Secondary | ICD-10-CM | POA: Diagnosis not present

## 2014-12-08 DIAGNOSIS — Z8673 Personal history of transient ischemic attack (TIA), and cerebral infarction without residual deficits: Secondary | ICD-10-CM

## 2014-12-08 DIAGNOSIS — R771 Abnormality of globulin: Secondary | ICD-10-CM | POA: Diagnosis not present

## 2014-12-08 MED ORDER — SIMVASTATIN 10 MG PO TABS: 10.0000 mg | ORAL_TABLET | Freq: Every day | ORAL | Status: DC

## 2014-12-08 NOTE — Progress Notes (Signed)
Name: Collin Gonzales   MRN: [id number]    DOB: 02/10/1936   Date:12/08/2014       Progress Note  Subjective  Chief Complaint  Chief Complaint  Patient presents with  . Follow-up    Discuss statin therapy  . Hyperlipidemia    HPI  Pt. Is here to discuss starting a statin for secondary prevention. Pt.'s FLP was recently obtained which showed a normal TC, TG, HDL, and LDL. His risk of CVD was estimated at 17% based on Framingham equation. Pt. Has no history of heart disease but had a stroke in 2000. Following the stroke, he was never started on statin therapy. His liver enzymes have been normal, globulin level mildly elevated. He denies any chest pain, dyspnea, abdominal pain, or myalgias.  Past Medical History  Diagnosis Date  . GERD (gastroesophageal reflux disease)   . COPD (chronic obstructive pulmonary disease) ([place])   . Arthritis     rheumatoid arthritis  . Insomnia   . History of prostate cancer     Past Surgical History  Procedure Laterality Date  . Appendectomy  1965  . Hernia repair  1965  . Biopsy breast Right 2006    Family History  Problem Relation Age of Onset  . Cancer Mother   . Cancer Sister   . Cancer Brother   . Cancer Father     Lung Cancer    Social History   Social History  . Marital Status: Single    Spouse Name: N/A  . Number of Children: N/A  . Years of Education: N/A   Occupational History  . Not on file.   Social History Main Topics  . Smoking status: Former Smoker    Quit date: 02/21/2013  . Smokeless tobacco: Not on file  . Alcohol Use: No  . Drug Use: No  . Sexual Activity: Not on file   Other Topics Concern  . Not on file   Social History Narrative    Current outpatient prescriptions:  .  albuterol (PROAIR HFA) 108 (90 BASE) MCG/ACT inhaler, Inhale into the lungs., Disp: , Rfl:  .  B COMPLEX-C PO, Take by mouth., Disp: , Rfl:  .  Fluticasone-Salmeterol (ADVAIR DISKUS) 250-50 MCG/DOSE AEPB, Inhale into the lungs.,  Disp: , Rfl:  .  folic acid (FOLVITE) 1 MG tablet, Take by mouth., Disp: , Rfl:  .  methotrexate 2.5 MG tablet, Take by mouth., Disp: , Rfl:  .  omeprazole (PRILOSEC) 40 MG capsule, Take by mouth., Disp: , Rfl:  .  sildenafil (VIAGRA) 50 MG tablet, Take by mouth., Disp: , Rfl:  .  tiotropium (SPIRIVA HANDIHALER) 18 MCG inhalation capsule, Place into inhaler and inhale., Disp: , Rfl:  .  traZODone (DESYREL) 50 MG tablet, Take 1 tablet (50 mg total) by mouth at bedtime., Disp: 90 tablet, Rfl: 1  Allergies  Allergen Reactions  . Hydroxychloroquine Sulfate Other (See Comments)    Other reaction(s): Dizziness   Review of Systems  Cardiovascular: Negative for chest pain and palpitations.  Gastrointestinal: Negative for nausea, vomiting and abdominal pain.  Musculoskeletal: Negative for myalgias, back pain and joint pain.    Objective  Filed Vitals:   12/08/14 0919  BP: 124/78  Pulse: 86  Temp: 98.4 F (36.9 C)  TempSrc: Oral  Resp: 19  Height: '5\' 8"'$  (1.727 m)  Weight: 133 lb 4.8 oz (60.464 kg)  SpO2: 91%    Physical Exam  Constitutional: He is well-developed, well-nourished, and in no distress.  Cardiovascular:  Normal rate and regular rhythm.   No murmur heard. Pulmonary/Chest: Effort normal and breath sounds normal. No respiratory distress. He has no wheezes.  Abdominal: Soft. Bowel sounds are normal. He exhibits no distension. There is no tenderness.  Nursing note and vitals reviewed.  Assessment & Plan  1. Cerebrovascular accident, old   2. Candidate for statin therapy due to risk of future cardiovascular event Started patient on simvastatin 10 mg at bedtime for secondary prevention. Lipid profile reviewed. Educated on potential side effects. Recheck lipids and liver enzymes in 3-4 months. - simvastatin (ZOCOR) 10 MG tablet; Take 1 tablet (10 mg total) by mouth at bedtime.  Dispense: 90 tablet; Refill: 3  3. Elevated serum globulin level - Comprehensive Metabolic  Panel (CMET)   Baker Moronta Asad A. [facility] 12/08/2014 9:47 AM

## 2014-12-09 LAB — COMPREHENSIVE METABOLIC PANEL
ALT: 9 IU/L (ref 0–44)
AST: 18 IU/L (ref 0–40)
Albumin/Globulin Ratio: 0.8 — ABNORMAL LOW (ref 1.1–2.5)
Albumin: 3.7 g/dL (ref 3.5–4.8)
Alkaline Phosphatase: 112 IU/L (ref 39–117)
BUN/Creatinine Ratio: 14 (ref 10–22)
BUN: 15 mg/dL (ref 8–27)
Bilirubin Total: 0.3 mg/dL (ref 0.0–1.2)
CALCIUM: 9.4 mg/dL (ref 8.6–10.2)
CO2: 25 mmol/L (ref 18–29)
CREATININE: 1.1 mg/dL (ref 0.76–1.27)
Chloride: 100 mmol/L (ref 97–106)
GFR calc Af Amer: 75 mL/min/{1.73_m2} (ref >59)
GFR, EST NON AFRICAN AMERICAN: 65 mL/min/{1.73_m2} (ref >59)
GLOBULIN, TOTAL: 4.9 g/dL — AB (ref 1.5–4.5)
Glucose: 94 mg/dL (ref 65–99)
Potassium: 4.9 mmol/L (ref 3.5–5.2)
SODIUM: 139 mmol/L (ref 136–144)
TOTAL PROTEIN: 8.6 g/dL — AB (ref 6.0–8.5)

## 2014-12-10 ENCOUNTER — Other Ambulatory Visit: Payer: Self-pay | Admitting: Family Medicine

## 2014-12-10 DIAGNOSIS — R771 Abnormality of globulin: Secondary | ICD-10-CM

## 2014-12-10 NOTE — Addendum Note (Signed)
Addended byManuella Ghazi, Averi Kilty A A on: 12/10/2014 10:04 AM   Modules accepted: Orders, SmartSet

## 2014-12-19 ENCOUNTER — Ambulatory Visit: Payer: Self-pay | Admitting: Internal Medicine

## 2014-12-23 ENCOUNTER — Ambulatory Visit: Payer: Self-pay | Admitting: Oncology

## 2014-12-30 ENCOUNTER — Inpatient Hospital Stay: Payer: Commercial Managed Care - HMO | Attending: Internal Medicine | Admitting: Oncology

## 2014-12-30 ENCOUNTER — Inpatient Hospital Stay: Payer: Commercial Managed Care - HMO

## 2014-12-30 VITALS — BP 150/83 | HR 69 | Temp 96.2°F | Resp 16 | Wt 134.0 lb

## 2014-12-30 DIAGNOSIS — Z801 Family history of malignant neoplasm of trachea, bronchus and lung: Secondary | ICD-10-CM | POA: Diagnosis not present

## 2014-12-30 DIAGNOSIS — R779 Abnormality of plasma protein, unspecified: Secondary | ICD-10-CM

## 2014-12-30 DIAGNOSIS — Z8546 Personal history of malignant neoplasm of prostate: Secondary | ICD-10-CM | POA: Diagnosis not present

## 2014-12-30 DIAGNOSIS — Z79899 Other long term (current) drug therapy: Secondary | ICD-10-CM | POA: Diagnosis not present

## 2014-12-30 DIAGNOSIS — E8809 Other disorders of plasma-protein metabolism, not elsewhere classified: Secondary | ICD-10-CM

## 2014-12-30 DIAGNOSIS — Z87891 Personal history of nicotine dependence: Secondary | ICD-10-CM | POA: Diagnosis not present

## 2014-12-30 DIAGNOSIS — K219 Gastro-esophageal reflux disease without esophagitis: Secondary | ICD-10-CM | POA: Insufficient documentation

## 2014-12-30 DIAGNOSIS — D890 Polyclonal hypergammaglobulinemia: Secondary | ICD-10-CM | POA: Diagnosis not present

## 2014-12-30 DIAGNOSIS — Z808 Family history of malignant neoplasm of other organs or systems: Secondary | ICD-10-CM | POA: Diagnosis not present

## 2014-12-30 DIAGNOSIS — J449 Chronic obstructive pulmonary disease, unspecified: Secondary | ICD-10-CM | POA: Insufficient documentation

## 2014-12-30 LAB — BASIC METABOLIC PANEL
ANION GAP: 7 (ref 5–15)
BUN: 22 mg/dL — ABNORMAL HIGH (ref 6–20)
CO2: 27 mmol/L (ref 22–32)
CREATININE: 1.08 mg/dL (ref 0.61–1.24)
Calcium: 9.5 mg/dL (ref 8.9–10.3)
Chloride: 104 mmol/L (ref 101–111)
GFR calc non Af Amer: >60 mL/min (ref >60)
Glucose, Bld: 92 mg/dL (ref 65–99)
Potassium: 4.1 mmol/L (ref 3.5–5.1)
SODIUM: 138 mmol/L (ref 135–145)

## 2014-12-30 LAB — CBC WITH DIFFERENTIAL/PLATELET
BASOS ABS: 0 10*3/uL (ref 0–0.1)
BASOS PCT: 1 %
EOS ABS: 0.1 10*3/uL (ref 0–0.7)
Eosinophils Relative: 2 %
HEMATOCRIT: 38.4 % — AB (ref 40.0–52.0)
HEMOGLOBIN: 12.4 g/dL — AB (ref 13.0–18.0)
Lymphocytes Relative: 26 %
Lymphs Abs: 1.5 10*3/uL (ref 1.0–3.6)
MCH: 29.9 pg (ref 26.0–34.0)
MCHC: 32.2 g/dL (ref 32.0–36.0)
MCV: 92.9 fL (ref 80.0–100.0)
Monocytes Absolute: 0.5 10*3/uL (ref 0.2–1.0)
Monocytes Relative: 9 %
NEUTROS ABS: 3.6 10*3/uL (ref 1.4–6.5)
NEUTROS PCT: 62 %
Platelets: 268 10*3/uL (ref 150–440)
RBC: 4.13 MIL/uL — AB (ref 4.40–5.90)
RDW: 18 % — ABNORMAL HIGH (ref 11.5–14.5)
WBC: 5.7 10*3/uL (ref 3.8–10.6)

## 2014-12-31 LAB — PROTEIN ELECTRO, RANDOM URINE
ALBUMIN ELP UR: 25.3 %
Alpha-1-Globulin, U: 1.3 %
Alpha-2-Globulin, U: 12.3 %
Beta Globulin, U: 20.1 %
GAMMA GLOBULIN, U: 41 %
TOTAL PROTEIN, URINE-UPE24: 21.4 mg/dL

## 2014-12-31 LAB — PROTEIN ELECTROPHORESIS, SERUM
A/G RATIO SPE: 0.6 — AB (ref 0.7–1.7)
Albumin ELP: 3.2 g/dL (ref 2.9–4.4)
Alpha-1-Globulin: 0.3 g/dL (ref 0.0–0.4)
Alpha-2-Globulin: 0.7 g/dL (ref 0.4–1.0)
Beta Globulin: 1.5 g/dL — ABNORMAL HIGH (ref 0.7–1.3)
GLOBULIN, TOTAL: 5.2 g/dL — AB (ref 2.2–3.9)
Gamma Globulin: 2.6 g/dL — ABNORMAL HIGH (ref 0.4–1.8)
Total Protein ELP: 8.4 g/dL (ref 6.0–8.5)

## 2014-12-31 LAB — IGG, IGA, IGM
IGA: 676 mg/dL — AB (ref 61–437)
IGM, SERUM: 386 mg/dL — AB (ref 15–143)
IgG (Immunoglobin G), Serum: 2401 mg/dL — ABNORMAL HIGH (ref 700–1600)

## 2015-01-02 ENCOUNTER — Telehealth: Payer: Self-pay | Admitting: Family Medicine

## 2015-01-02 NOTE — Telephone Encounter (Signed)
Have appointment with Dr Raul Del on this coming Tuesday Nov. 15, 2016 @ 8:15 and is needing a referral. The referral on file ran out in September.

## 2015-01-02 NOTE — Telephone Encounter (Signed)
Collin Gonzales obtained  Auth # [id number]  Start - 01/02/15 Expires - 07/01/15 ICD-10: J44.1

## 2015-01-06 DIAGNOSIS — R0602 Shortness of breath: Secondary | ICD-10-CM | POA: Diagnosis not present

## 2015-01-06 DIAGNOSIS — R918 Other nonspecific abnormal finding of lung field: Secondary | ICD-10-CM | POA: Diagnosis not present

## 2015-01-06 DIAGNOSIS — J439 Emphysema, unspecified: Secondary | ICD-10-CM | POA: Diagnosis not present

## 2015-01-06 DIAGNOSIS — R0902 Hypoxemia: Secondary | ICD-10-CM | POA: Diagnosis not present

## 2015-01-07 DIAGNOSIS — J439 Emphysema, unspecified: Secondary | ICD-10-CM | POA: Diagnosis not present

## 2015-01-07 DIAGNOSIS — J449 Chronic obstructive pulmonary disease, unspecified: Secondary | ICD-10-CM | POA: Diagnosis not present

## 2015-01-11 NOTE — Progress Notes (Signed)
[place]  [phone] [phone]  ID: [name]: 06-May-1935  MR#: [id number]  OEV#:[id number]  Patient Care Team: Roselee Nova, MD as PCP - General (Family Medicine)  CHIEF COMPLAINT:  Chief Complaint  Patient presents with  . New Evaluation    abnormal labs    INTERVAL HISTORY: Patient is a 79 year old male who was found to have an elevated globulin level on routine blood work. He currently feels well and is asymptomatic. He has no neurologic complaints. He denies any recent fevers or illnesses. He has a good appetite and denies weight loss. He denies any pain. He has no chest pain or shortness of breath. He denies any nausea, vomiting, constipation, or diarrhea. He has no urinary complaints. Patient feels at his baseline and offers no specific complaints today.  REVIEW OF SYSTEMS:   Review of Systems  Constitutional: Negative.  Negative for fever and malaise/fatigue.  Respiratory: Negative.   Cardiovascular: Negative.   Gastrointestinal: Negative.   Musculoskeletal: Negative.   Neurological: Negative.  Negative for weakness.    As per HPI. Otherwise, a complete review of systems is negatve.  PAST MEDICAL HISTORY: Past Medical History  Diagnosis Date  . GERD (gastroesophageal reflux disease)   . COPD (chronic obstructive pulmonary disease) ([place])   . Arthritis     rheumatoid arthritis  . Insomnia   . History of prostate cancer     PAST SURGICAL HISTORY: Past Surgical History  Procedure Laterality Date  . Appendectomy  1965  . Hernia repair  1965  . Biopsy breast Right 2006    FAMILY HISTORY Family History  Problem Relation Age of Onset  . Cancer Mother   . Cancer Sister   . Cancer Brother   . Cancer Father     Lung Cancer       ADVANCED DIRECTIVES:    HEALTH MAINTENANCE: Social History  Substance Use Topics  . Smoking status: Former Smoker    Quit date: 02/21/2013  . Smokeless tobacco: Not on  file  . Alcohol Use: No     Colonoscopy:  PAP:  Bone density:  Lipid panel:  Allergies  Allergen Reactions  . Hydroxychloroquine Sulfate Other (See Comments)    Other reaction(s): Dizziness    Current Outpatient Prescriptions  Medication Sig Dispense Refill  . albuterol (PROAIR HFA) 108 (90 BASE) MCG/ACT inhaler Inhale into the lungs.    . B COMPLEX-C PO Take by mouth.    . Fluticasone-Salmeterol (ADVAIR DISKUS) 250-50 MCG/DOSE AEPB Inhale into the lungs.    . folic acid (FOLVITE) 1 MG tablet Take by mouth.    . methotrexate 2.5 MG tablet Take by mouth.    Marland Kitchen omeprazole (PRILOSEC) 40 MG capsule Take by mouth.    . sildenafil (VIAGRA) 50 MG tablet Take by mouth.    . simvastatin (ZOCOR) 10 MG tablet Take 1 tablet (10 mg total) by mouth at bedtime. 90 tablet 3  . tiotropium (SPIRIVA HANDIHALER) 18 MCG inhalation capsule Place into inhaler and inhale.    . traZODone (DESYREL) 50 MG tablet Take 1 tablet (50 mg total) by mouth at bedtime. 90 tablet 1   No current facility-administered medications for this visit.    OBJECTIVE: Filed Vitals:   12/30/14 1154  BP: 150/83  Pulse: 69  Temp: 96.2 F (35.7 C)  Resp: 16     Body mass index is 20.39 kg/(m^2).    ECOG FS:0 - Asymptomatic  General: Well-developed, well-nourished, no acute distress.  Eyes: Pink conjunctiva, anicteric sclera. HEENT: Normocephalic, moist mucous membranes, clear oropharnyx. Lungs: Clear to auscultation bilaterally. Heart: Regular rate and rhythm. No rubs, murmurs, or gallops. Abdomen: Soft, nontender, nondistended. No organomegaly noted, normoactive bowel sounds. Musculoskeletal: No edema, cyanosis, or clubbing. Neuro: Alert, answering all questions appropriately. Cranial nerves grossly intact. Skin: No rashes or petechiae noted. Psych: Normal affect. Lymphatics: No cervical, calvicular, axillary or inguinal LAD.   LAB RESULTS:  Lab Results  Component Value Date   NA 138 12/30/2014   K 4.1  12/30/2014   CL 104 12/30/2014   CO2 27 12/30/2014   GLUCOSE 92 12/30/2014   BUN 22* 12/30/2014   CREATININE 1.08 12/30/2014   CALCIUM 9.5 12/30/2014   PROT 8.6* 12/08/2014   ALBUMIN 3.7 12/08/2014   AST 18 12/08/2014   ALT 9 12/08/2014   ALKPHOS 112 12/08/2014   BILITOT 0.3 12/08/2014   GFRNONAA >60 12/30/2014   GFRAA >60 12/30/2014    Lab Results  Component Value Date   WBC 5.7 12/30/2014   NEUTROABS 3.6 12/30/2014   HGB 12.4* 12/30/2014   HCT 38.4* 12/30/2014   MCV 92.9 12/30/2014   PLT 268 12/30/2014     STUDIES: No results found.  ASSESSMENT: Polyclonal gammopathy.  PLAN:    1. Polyclonal gammopathy. Patient's IgG, IgA, and IgM levels are all elevated. He does not have an M spike on his SPEP. The remainder of his laboratory work is either negative or within normal limits. Polyclonal gammopathy is typically a benign disorder and does not require further intervention. There is no risk for progression to multiple myeloma. Patient will return to clinic in 4 months with repeat laboratory work and further evaluation. If his laboratory work remains negative at that point, he can be discharged from clinic.  Patient expressed understanding and was in agreement with this plan. He also understands that He can call clinic at any time with any questions, concerns, or complaints.    Lloyd Huger, MD   01/11/2015 9:19 AM

## 2015-01-14 ENCOUNTER — Ambulatory Visit
Admission: RE | Admit: 2015-01-14 | Discharge: 2015-01-14 | Disposition: A | Discharge status: Home / Self Care | Payer: Commercial Managed Care - HMO | Source: Ambulatory Visit | Attending: Specialist | Admitting: Specialist

## 2015-01-14 DIAGNOSIS — Z09 Encounter for follow-up examination after completed treatment for conditions other than malignant neoplasm: Secondary | ICD-10-CM | POA: Diagnosis not present

## 2015-01-14 DIAGNOSIS — J439 Emphysema, unspecified: Secondary | ICD-10-CM | POA: Insufficient documentation

## 2015-01-14 DIAGNOSIS — R911 Solitary pulmonary nodule: Secondary | ICD-10-CM | POA: Diagnosis not present

## 2015-01-14 DIAGNOSIS — C61 Malignant neoplasm of prostate: Secondary | ICD-10-CM | POA: Diagnosis not present

## 2015-01-27 ENCOUNTER — Other Ambulatory Visit: Payer: Self-pay | Admitting: Specialist

## 2015-01-27 DIAGNOSIS — R911 Solitary pulmonary nodule: Secondary | ICD-10-CM

## 2015-02-03 ENCOUNTER — Encounter: Admission: RE | Admit: 2015-02-03 | Payer: Commercial Managed Care - HMO | Source: Ambulatory Visit

## 2015-02-05 ENCOUNTER — Telehealth: Payer: Self-pay

## 2015-02-05 ENCOUNTER — Inpatient Hospital Stay: Payer: Commercial Managed Care - HMO | Admitting: Cardiothoracic Surgery

## 2015-02-05 NOTE — Telephone Encounter (Signed)
Brandy from [facility] called and asked I placed a referral for pt to Dr. Nestor Lewandowsky for ICD-10: R91.1 and R71.1.  [employer] referral obtained [place] # [id number] Start - 02/05/2015 Expires - 08/04/2015

## 2015-02-06 DIAGNOSIS — J439 Emphysema, unspecified: Secondary | ICD-10-CM | POA: Diagnosis not present

## 2015-02-06 DIAGNOSIS — J449 Chronic obstructive pulmonary disease, unspecified: Secondary | ICD-10-CM | POA: Diagnosis not present

## 2015-02-12 ENCOUNTER — Encounter
Admission: RE | Admit: 2015-02-12 | Discharge: 2015-02-12 | Disposition: A | Discharge status: Home / Self Care | Payer: Commercial Managed Care - HMO | Source: Ambulatory Visit | Attending: Specialist | Admitting: Specialist

## 2015-02-12 DIAGNOSIS — R911 Solitary pulmonary nodule: Secondary | ICD-10-CM | POA: Diagnosis not present

## 2015-02-12 LAB — GLUCOSE, CAPILLARY: GLUCOSE-CAPILLARY: 75 mg/dL (ref 65–99)

## 2015-02-12 MED ORDER — FLUDEOXYGLUCOSE F - 18 (FDG) INJECTION
13.2600 | Freq: Once | INTRAVENOUS | Status: AC | PRN
Start: 2015-02-12 — End: 2015-02-12
  Administered 2015-02-12: 13.26 via INTRAVENOUS

## 2015-02-19 ENCOUNTER — Inpatient Hospital Stay: Payer: Commercial Managed Care - HMO | Admitting: Cardiothoracic Surgery

## 2015-02-19 ENCOUNTER — Inpatient Hospital Stay: Payer: Commercial Managed Care - HMO | Attending: Oncology | Admitting: Oncology

## 2015-02-19 ENCOUNTER — Ambulatory Visit: Payer: Commercial Managed Care - HMO | Admitting: Oncology

## 2015-02-19 ENCOUNTER — Encounter: Payer: Self-pay | Admitting: *Deleted

## 2015-02-19 VITALS — BP 147/87 | HR 80 | Temp 97.7°F | Resp 18 | Wt 136.7 lb

## 2015-02-19 DIAGNOSIS — M069 Rheumatoid arthritis, unspecified: Secondary | ICD-10-CM | POA: Diagnosis not present

## 2015-02-19 DIAGNOSIS — I2510 Atherosclerotic heart disease of native coronary artery without angina pectoris: Secondary | ICD-10-CM

## 2015-02-19 DIAGNOSIS — R918 Other nonspecific abnormal finding of lung field: Secondary | ICD-10-CM

## 2015-02-19 DIAGNOSIS — Z79899 Other long term (current) drug therapy: Secondary | ICD-10-CM | POA: Insufficient documentation

## 2015-02-19 DIAGNOSIS — Z801 Family history of malignant neoplasm of trachea, bronchus and lung: Secondary | ICD-10-CM | POA: Insufficient documentation

## 2015-02-19 DIAGNOSIS — Z8546 Personal history of malignant neoplasm of prostate: Secondary | ICD-10-CM | POA: Diagnosis not present

## 2015-02-19 DIAGNOSIS — Z87891 Personal history of nicotine dependence: Secondary | ICD-10-CM

## 2015-02-19 DIAGNOSIS — Z9981 Dependence on supplemental oxygen: Secondary | ICD-10-CM | POA: Insufficient documentation

## 2015-02-19 DIAGNOSIS — J449 Chronic obstructive pulmonary disease, unspecified: Secondary | ICD-10-CM | POA: Diagnosis not present

## 2015-02-19 DIAGNOSIS — R590 Localized enlarged lymph nodes: Secondary | ICD-10-CM

## 2015-02-19 DIAGNOSIS — D890 Polyclonal hypergammaglobulinemia: Secondary | ICD-10-CM | POA: Insufficient documentation

## 2015-02-19 DIAGNOSIS — K219 Gastro-esophageal reflux disease without esophagitis: Secondary | ICD-10-CM | POA: Insufficient documentation

## 2015-02-19 NOTE — Progress Notes (Signed)
Patient is followed by Dr. Raul Del for COPD.  He had a CT scan then a PET scan ordered by Dr. Raul Del and he would like patient to f/u here with results.

## 2015-02-20 ENCOUNTER — Telehealth: Payer: Self-pay | Admitting: *Deleted

## 2015-02-20 NOTE — Progress Notes (Signed)
Met with patient at medical oncology appointment. Introduced Programmer, multimedia and reviewed plan of care. Will follow and assist with coordination of ebus.

## 2015-02-20 NOTE — Telephone Encounter (Signed)
Attempted to notify patient of preop and ebus appointments. Left message and will contact patient on Monday with appointments.

## 2015-02-22 DIAGNOSIS — C3491 Malignant neoplasm of unspecified part of right bronchus or lung: Secondary | ICD-10-CM

## 2015-02-22 HISTORY — DX: Malignant neoplasm of unspecified part of right bronchus or lung: C34.91

## 2015-02-23 ENCOUNTER — Telehealth: Payer: Self-pay | Admitting: *Deleted

## 2015-02-23 NOTE — Progress Notes (Signed)
[place]  [phone] [phone]  ID: [name]: 11/27/1935  MR#: [id number]  EUM#:[id number]  Patient Care Team: Roselee Nova, MD as PCP - General (Family Medicine)  CHIEF COMPLAINT:  Chief Complaint  Patient presents with  . Lung Mass    INTERVAL HISTORY: Patient was initially evaluated in clinic on December 30, 2014 after a referral for MGUS. Recently patient had CT scan and PET scan that revealed a lesion concerning for malignancy. He currently feels well and is at his baseline. He has chronic shortness of breath and wears oxygen 24 hours per day. He has no neurologic complaints. He denies any recent fevers or illnesses. He has a good appetite and denies weight loss. He denies any pain. He has no chest pain. He denies any nausea, vomiting, constipation, or diarrhea. He has no urinary complaints. Patient offers no specific complaints today.  REVIEW OF SYSTEMS:   Review of Systems  Constitutional: Negative.  Negative for fever and malaise/fatigue.  Respiratory: Positive for shortness of breath. Negative for cough and hemoptysis.   Cardiovascular: Negative.  Negative for chest pain.  Gastrointestinal: Negative.   Musculoskeletal: Negative.   Neurological: Negative.  Negative for weakness.    As per HPI. Otherwise, a complete review of systems is negatve.  PAST MEDICAL HISTORY: Past Medical History  Diagnosis Date  . GERD (gastroesophageal reflux disease)   . COPD (chronic obstructive pulmonary disease) ([place])   . Arthritis     rheumatoid arthritis  . Insomnia   . History of prostate cancer     PAST SURGICAL HISTORY: Past Surgical History  Procedure Laterality Date  . Appendectomy  1965  . Hernia repair  1965  . Biopsy breast Right 2006    FAMILY HISTORY Family History  Problem Relation Age of Onset  . Cancer Mother   . Cancer Sister   . Cancer Brother   . Cancer Father     Lung Cancer       ADVANCED  DIRECTIVES:    HEALTH MAINTENANCE: Social History  Substance Use Topics  . Smoking status: Former Smoker    Quit date: 02/21/2013  . Smokeless tobacco: Not on file  . Alcohol Use: No     Colonoscopy:  PAP:  Bone density:  Lipid panel:  Allergies  Allergen Reactions  . Hydroxychloroquine Sulfate Other (See Comments)    Other reaction(s): Dizziness    Current Outpatient Prescriptions  Medication Sig Dispense Refill  . albuterol (PROAIR HFA) 108 (90 BASE) MCG/ACT inhaler Inhale into the lungs.    . B COMPLEX-C PO Take by mouth.    . Fluticasone-Salmeterol (ADVAIR DISKUS) 250-50 MCG/DOSE AEPB Inhale into the lungs.    . methotrexate 2.5 MG tablet Take by mouth.    Marland Kitchen omeprazole (PRILOSEC) 40 MG capsule Take by mouth.    . simvastatin (ZOCOR) 10 MG tablet Take 1 tablet (10 mg total) by mouth at bedtime. 90 tablet 3  . tiotropium (SPIRIVA HANDIHALER) 18 MCG inhalation capsule Place into inhaler and inhale.    . traZODone (DESYREL) 50 MG tablet Take 1 tablet (50 mg total) by mouth at bedtime. 90 tablet 1  . folic acid (FOLVITE) 1 MG tablet Take by mouth. Reported on 02/19/2015    . sildenafil (VIAGRA) 50 MG tablet Take by mouth. Reported on 02/19/2015     No current facility-administered medications for this visit.    OBJECTIVE: Filed Vitals:   02/19/15 1116  BP: 147/87  Pulse: 80  Temp: 97.7 F (36.5 C)  Resp: 18     Body mass index is 20.79 kg/(m^2).    ECOG FS:1 - Symptomatic but completely ambulatory  General: Well-developed, well-nourished, no acute distress. Eyes: Pink conjunctiva, anicteric sclera. Lungs: Clear to auscultation bilaterally. Heart: Regular rate and rhythm. No rubs, murmurs, or gallops. Abdomen: Soft, nontender, nondistended. No organomegaly noted, normoactive bowel sounds. Musculoskeletal: No edema, cyanosis, or clubbing. Neuro: Alert, answering all questions appropriately. Cranial nerves grossly intact. Skin: No rashes or petechiae noted. Psych:  Normal affect.   LAB RESULTS:  Lab Results  Component Value Date   NA 138 12/30/2014   K 4.1 12/30/2014   CL 104 12/30/2014   CO2 27 12/30/2014   GLUCOSE 92 12/30/2014   BUN 22* 12/30/2014   CREATININE 1.08 12/30/2014   CALCIUM 9.5 12/30/2014   PROT 8.6* 12/08/2014   ALBUMIN 3.7 12/08/2014   AST 18 12/08/2014   ALT 9 12/08/2014   ALKPHOS 112 12/08/2014   BILITOT 0.3 12/08/2014   GFRNONAA >60 12/30/2014   GFRAA >60 12/30/2014    Lab Results  Component Value Date   WBC 5.7 12/30/2014   NEUTROABS 3.6 12/30/2014   HGB 12.4* 12/30/2014   HCT 38.4* 12/30/2014   MCV 92.9 12/30/2014   PLT 268 12/30/2014     STUDIES: Nm Pet Image Initial (pi) Skull Base To Thigh  02/12/2015  CLINICAL DATA:  Initial treatment strategy for enlarging right upper lobe pulmonary nodule. EXAM: NUCLEAR MEDICINE PET SKULL BASE TO THIGH TECHNIQUE: 13.3 mCi F-18 FDG was injected intravenously. Full-ring PET imaging was performed from the skull base to thigh after the radiotracer. CT data was obtained and used for attenuation correction and anatomic localization. FASTING BLOOD GLUCOSE:  Value: 75 mg/dl COMPARISON:  01/14/2015 chest CT. FINDINGS: NECK No hypermetabolic lymph nodes in the neck. There is nonspecific mild asymmetric hypermetabolism in the right glottis (max SUV 4.3) without discrete mass on the CT images. Mucosal thickening is present throughout the inferior right maxillary sinus and posterior left sphenoid sinus. There is partial opacification of the bilateral ethmoidal air cells. CHEST There is mild hypermetabolic right paratracheal lymphadenopathy, for example a mildly enlarged 1.0 cm hypermetabolic right paratracheal node (series 3/image 93) with max SUV 4.7. There is a mildly enlarged hypermetabolic 1.2 cm subcarinal node (series 3/image 105) with max SUV 5.0. There is hypermetabolic bilateral hilar lymphadenopathy. For example a mildly enlarged 1.1 cm left hilar hypermetabolic node (series  3/image 114) demonstrates max SUV 5.2. A hypermetabolic mildly enlarged right hilar node measuring approximately 1.3 cm (series 3/image 108) demonstrates max SUV 5.3. There is a spiculated hypermetabolic 1.2 x 0.8 cm pulmonary nodule in the posterior right upper lobe (series 3/image 72) with max SUV 4.4. The previously described 1.2 x 0.6 cm anterior right upper lobe pulmonary nodule (series 3/image 80) is stable in size and demonstrates no significant metabolism. The previously described 1.2 x 0.4 cm right middle lobe pulmonary nodule (series 3/image 125) is stable in size and demonstrates no significant metabolism. There is a 0.6 cm posterior left upper lobe solid pulmonary nodule (series 3/image 96), stable in size and without significant metabolism. No acute consolidative airspace disease or new significant pulmonary nodules. No hypermetabolic axillary nodes. Left main, left anterior descending, left circumflex and right coronary atherosclerosis. Moderate centrilobular and paraseptal emphysema with diffuse bronchial wall thickening. ABDOMEN/PELVIS No abnormal hypermetabolic activity within the liver, pancreas, adrenal glands, or spleen. No hypermetabolic lymph nodes in the abdomen or pelvis. Granulomatous calcification in  the posterior right liver lobe. Brachytherapy seeds are seen throughout the normal size prostate, with no appreciable prostatic hypermetabolism. Atherosclerotic nonaneurysmal abdominal aorta. There are bilateral hydroceles in the scrotum. SKELETON No focal hypermetabolic activity to suggest skeletal metastasis. Mild hypermetabolism between the L4 and L5 spinous processes is favored to be degenerative in etiology, with no discrete osseous lesion on the CT images in this location. IMPRESSION: 1. Hypermetabolic spiculated 1.2 cm posterior right upper lobe pulmonary nodule, in keeping with primary bronchogenic carcinoma. 2. Hypermetabolic ipsilateral hilar, ipsilateral mediastinal and contralateral  hilar lymphadenopathy, representing metastatic disease until otherwise proven. 3. No hypermetabolic metastatic disease in the neck, abdomen or pelvis. 4. Nonspecific mild asymmetric hypermetabolism in the right glottis without discrete mass on the CT images, which may be physiologic, however a right glottic neoplasm cannot be entirely excluded. Consider correlation with laryngoscopy. 5. Additional findings including paranasal sinusitis, coronary atherosclerosis, moderate emphysema and bilateral hydroceles in the scrotum. Electronically Signed   By: Ilona Sorrel M.D.   On: 02/12/2015 12:50    ASSESSMENT: Lung mass, highly suspicious for malignancy. MGUS.   PLAN:    1. Lung mass: CT and PET scan results reviewed independently with concerning 1.2 cm lesion in patient's right upper lobe. Patient also has ipsilateral and contralateral PET positive lymphadenopathy, concerning for N3 disease. Patient will require biopsy to confirm the diagnosis and a referral has been made to pulmonology for consideration of a ENB. Patient will return to clinic one week after his biopsy to discuss the results and treatment planning. 2.  Polyclonal gammopathy. Patient's IgG, IgA, and IgM levels are all elevated. He does not have an M spike on his SPEP. The remainder of his laboratory work is either negative or within normal limits. Polyclonal gammopathy is typically a benign disorder and does not require further intervention.  Approximately 30 minutes was spent in discussion of which greater than 50% was consultation.  Patient expressed understanding and was in agreement with this plan. He also understands that He can call clinic at any time with any questions, concerns, or complaints.    Lloyd Huger, MD   02/23/2015 11:44 AM

## 2015-02-23 NOTE — Telephone Encounter (Signed)
Notified patient of pre-op appointment scheduled for 02/24/15 9:45am in [place], 2nd floor, to bring medications. Also notified of procedure scheduled for 02/26/15 [facility] arrival. Reminded to be NPO after midnight, continue to avoid anticoagulant medications, and have a driver and support person to be with patient at home after the procedure. Patient verbalizes understanding and reads back appointments.

## 2015-02-24 ENCOUNTER — Encounter
Admission: RE | Admit: 2015-02-24 | Discharge: 2015-02-24 | Disposition: A | Discharge status: Home / Self Care | Payer: Commercial Managed Care - HMO | Source: Ambulatory Visit | Attending: Internal Medicine | Admitting: Internal Medicine

## 2015-02-24 DIAGNOSIS — J449 Chronic obstructive pulmonary disease, unspecified: Secondary | ICD-10-CM | POA: Diagnosis not present

## 2015-02-24 DIAGNOSIS — D890 Polyclonal hypergammaglobulinemia: Secondary | ICD-10-CM | POA: Diagnosis not present

## 2015-02-24 DIAGNOSIS — R918 Other nonspecific abnormal finding of lung field: Secondary | ICD-10-CM | POA: Diagnosis present

## 2015-02-24 DIAGNOSIS — K219 Gastro-esophageal reflux disease without esophagitis: Secondary | ICD-10-CM | POA: Diagnosis not present

## 2015-02-24 DIAGNOSIS — M1990 Unspecified osteoarthritis, unspecified site: Secondary | ICD-10-CM | POA: Diagnosis not present

## 2015-02-24 DIAGNOSIS — I2510 Atherosclerotic heart disease of native coronary artery without angina pectoris: Secondary | ICD-10-CM | POA: Diagnosis not present

## 2015-02-24 DIAGNOSIS — R911 Solitary pulmonary nodule: Secondary | ICD-10-CM | POA: Diagnosis not present

## 2015-02-24 DIAGNOSIS — D472 Monoclonal gammopathy: Secondary | ICD-10-CM | POA: Diagnosis not present

## 2015-02-24 DIAGNOSIS — Z9981 Dependence on supplemental oxygen: Secondary | ICD-10-CM | POA: Diagnosis not present

## 2015-02-24 DIAGNOSIS — Z87891 Personal history of nicotine dependence: Secondary | ICD-10-CM | POA: Diagnosis not present

## 2015-02-24 DIAGNOSIS — Z809 Family history of malignant neoplasm, unspecified: Secondary | ICD-10-CM | POA: Diagnosis not present

## 2015-02-24 DIAGNOSIS — G4700 Insomnia, unspecified: Secondary | ICD-10-CM | POA: Diagnosis not present

## 2015-02-24 DIAGNOSIS — Z801 Family history of malignant neoplasm of trachea, bronchus and lung: Secondary | ICD-10-CM | POA: Diagnosis not present

## 2015-02-24 DIAGNOSIS — Z7951 Long term (current) use of inhaled steroids: Secondary | ICD-10-CM | POA: Diagnosis not present

## 2015-02-24 DIAGNOSIS — Z8546 Personal history of malignant neoplasm of prostate: Secondary | ICD-10-CM | POA: Diagnosis not present

## 2015-02-24 DIAGNOSIS — N433 Hydrocele, unspecified: Secondary | ICD-10-CM | POA: Diagnosis not present

## 2015-02-24 DIAGNOSIS — M069 Rheumatoid arthritis, unspecified: Secondary | ICD-10-CM | POA: Diagnosis not present

## 2015-02-24 DIAGNOSIS — R0602 Shortness of breath: Secondary | ICD-10-CM | POA: Diagnosis not present

## 2015-02-24 DIAGNOSIS — Z79899 Other long term (current) drug therapy: Secondary | ICD-10-CM | POA: Diagnosis not present

## 2015-02-24 NOTE — Patient Instructions (Signed)
  Your procedure is scheduled on:02/26/15 Report to Day Surgery. To find out your arrival time please call [phone] between 1PM - 3PM on 02/25/15.  Remember: Instructions that are not followed completely may result in serious medical risk, up to and including death, or upon the discretion of your surgeon and anesthesiologist your surgery may need to be rescheduled.    _x___ 1. Do not eat food or drink liquids after midnight. No gum chewing or hard candies.     __x__ 2. No Alcohol for 24 hours before or after surgery.   ____ 3. Bring all medications with you on the day of surgery if instructed.    __x__ 4. Notify your doctor if there is any change in your medical condition     (cold, fever, infections).     Do not wear jewelry, make-up, hairpins, clips or nail polish.  Do not wear lotions, powders, or perfumes. You may wear deodorant.  Do not shave 48 hours prior to surgery. Men may shave face and neck.  Do not bring valuables to the hospital.    [facility] is not responsible for any belongings or valuables.               Contacts, dentures or bridgework may not be worn into surgery.  Leave your suitcase in the car. After surgery it may be brought to your room.  For patients admitted to the hospital, discharge time is determined by your                treatment team.   Patients discharged the day of surgery will not be allowed to drive home.   Please read over the following fact sheets that you were given:   Surgical Site Infection Prevention   ____ Take these medicines the morning of surgery with A SIP OF WATER:    1. prilosec  2.   3.   4.  5.  6.  ____ Fleet Enema (as directed)   ____ Use CHG Soap as directed  __x__ Use inhalers on the day of surgery  ____ Stop metformin 2 days prior to surgery    ____ Take 1/2 of usual insulin dose the night before surgery and none on the morning of surgery.   ____ Stop Coumadin/Plavix/aspirin on   ____ Stop  Anti-inflammatories on today   ____ Stop supplements until after surgery.    ____ Bring C-Pap to the hospital.

## 2015-02-24 NOTE — Pre-Procedure Instructions (Signed)
Ekg 2009 noted history of septal infarct

## 2015-02-26 ENCOUNTER — Ambulatory Visit
Admission: RE | Admit: 2015-02-26 | Discharge: 2015-02-26 | Disposition: A | Discharge status: Home / Self Care | Payer: Commercial Managed Care - HMO | Source: Ambulatory Visit | Attending: Internal Medicine | Admitting: Internal Medicine

## 2015-02-26 ENCOUNTER — Encounter
Admission: RE | Disposition: A | Discharge status: Home / Self Care | Payer: Self-pay | Source: Ambulatory Visit | Attending: Internal Medicine

## 2015-02-26 ENCOUNTER — Ambulatory Visit: Payer: Commercial Managed Care - HMO | Admitting: Anesthesiology

## 2015-02-26 ENCOUNTER — Encounter: Payer: Self-pay | Admitting: *Deleted

## 2015-02-26 DIAGNOSIS — R911 Solitary pulmonary nodule: Secondary | ICD-10-CM | POA: Insufficient documentation

## 2015-02-26 DIAGNOSIS — Z87891 Personal history of nicotine dependence: Secondary | ICD-10-CM | POA: Insufficient documentation

## 2015-02-26 DIAGNOSIS — J449 Chronic obstructive pulmonary disease, unspecified: Secondary | ICD-10-CM | POA: Diagnosis not present

## 2015-02-26 DIAGNOSIS — Z801 Family history of malignant neoplasm of trachea, bronchus and lung: Secondary | ICD-10-CM | POA: Insufficient documentation

## 2015-02-26 DIAGNOSIS — M069 Rheumatoid arthritis, unspecified: Secondary | ICD-10-CM | POA: Insufficient documentation

## 2015-02-26 DIAGNOSIS — D472 Monoclonal gammopathy: Secondary | ICD-10-CM | POA: Insufficient documentation

## 2015-02-26 DIAGNOSIS — M1990 Unspecified osteoarthritis, unspecified site: Secondary | ICD-10-CM | POA: Insufficient documentation

## 2015-02-26 DIAGNOSIS — I2510 Atherosclerotic heart disease of native coronary artery without angina pectoris: Secondary | ICD-10-CM | POA: Insufficient documentation

## 2015-02-26 DIAGNOSIS — N433 Hydrocele, unspecified: Secondary | ICD-10-CM | POA: Insufficient documentation

## 2015-02-26 DIAGNOSIS — Z7951 Long term (current) use of inhaled steroids: Secondary | ICD-10-CM | POA: Insufficient documentation

## 2015-02-26 DIAGNOSIS — D890 Polyclonal hypergammaglobulinemia: Secondary | ICD-10-CM | POA: Insufficient documentation

## 2015-02-26 DIAGNOSIS — R918 Other nonspecific abnormal finding of lung field: Secondary | ICD-10-CM | POA: Diagnosis not present

## 2015-02-26 DIAGNOSIS — Z8546 Personal history of malignant neoplasm of prostate: Secondary | ICD-10-CM | POA: Insufficient documentation

## 2015-02-26 DIAGNOSIS — R0602 Shortness of breath: Secondary | ICD-10-CM | POA: Diagnosis not present

## 2015-02-26 DIAGNOSIS — R591 Generalized enlarged lymph nodes: Secondary | ICD-10-CM | POA: Diagnosis not present

## 2015-02-26 DIAGNOSIS — Z809 Family history of malignant neoplasm, unspecified: Secondary | ICD-10-CM | POA: Insufficient documentation

## 2015-02-26 DIAGNOSIS — K219 Gastro-esophageal reflux disease without esophagitis: Secondary | ICD-10-CM | POA: Insufficient documentation

## 2015-02-26 DIAGNOSIS — Z9981 Dependence on supplemental oxygen: Secondary | ICD-10-CM | POA: Insufficient documentation

## 2015-02-26 DIAGNOSIS — G4700 Insomnia, unspecified: Secondary | ICD-10-CM | POA: Diagnosis not present

## 2015-02-26 DIAGNOSIS — R599 Enlarged lymph nodes, unspecified: Secondary | ICD-10-CM | POA: Insufficient documentation

## 2015-02-26 DIAGNOSIS — Z79899 Other long term (current) drug therapy: Secondary | ICD-10-CM | POA: Insufficient documentation

## 2015-02-26 HISTORY — PX: ENDOBRONCHIAL ULTRASOUND: SHX5096

## 2015-02-26 SURGERY — ENDOBRONCHIAL ULTRASOUND (EBUS)
Anesthesia: General

## 2015-02-26 MED ORDER — ONDANSETRON HCL 4 MG/2ML IJ SOLN: 4.0000 mg | Freq: Once | INTRAMUSCULAR | Status: DC | PRN

## 2015-02-26 MED ORDER — IPRATROPIUM-ALBUTEROL 0.5-2.5 (3) MG/3ML IN SOLN
RESPIRATORY_TRACT | Status: AC
  Filled 2015-02-26: qty 3

## 2015-02-26 MED ORDER — FENTANYL CITRATE (PF) 100 MCG/2ML IJ SOLN: 25.0000 ug | INTRAMUSCULAR | Status: DC | PRN

## 2015-02-26 MED ORDER — PROPOFOL 10 MG/ML IV BOLUS
INTRAVENOUS | Status: DC | PRN
  Administered 2015-02-26: 30 mg via INTRAVENOUS
  Administered 2015-02-26: 120 mg via INTRAVENOUS

## 2015-02-26 MED ORDER — SUCCINYLCHOLINE CHLORIDE 20 MG/ML IJ SOLN
INTRAMUSCULAR | Status: DC | PRN
  Administered 2015-02-26: 70 mg via INTRAVENOUS

## 2015-02-26 MED ORDER — LACTATED RINGERS IV SOLN
INTRAVENOUS | Status: DC
  Administered 2015-02-26: 11:00:00 via INTRAVENOUS

## 2015-02-26 MED ORDER — MIDAZOLAM HCL 5 MG/5ML IJ SOLN
INTRAMUSCULAR | Status: DC | PRN
  Administered 2015-02-26: 1 mg via INTRAVENOUS

## 2015-02-26 MED ORDER — PHENYLEPHRINE HCL 10 MG/ML IJ SOLN
INTRAMUSCULAR | Status: DC | PRN
  Administered 2015-02-26 (×2): 100 ug via INTRAVENOUS

## 2015-02-26 MED ORDER — IPRATROPIUM-ALBUTEROL 0.5-2.5 (3) MG/3ML IN SOLN
3.0000 mL | RESPIRATORY_TRACT | Status: DC
  Administered 2015-02-26: 3 mL via RESPIRATORY_TRACT

## 2015-02-26 MED ORDER — LIDOCAINE HCL 2 % EX GEL: 1.0000 "application " | Freq: Once | CUTANEOUS | Status: DC

## 2015-02-26 MED ORDER — PROPOFOL 500 MG/50ML IV EMUL
INTRAVENOUS | Status: DC | PRN
  Administered 2015-02-26: 50 ug/kg/min via INTRAVENOUS

## 2015-02-26 MED ORDER — LIDOCAINE HCL (CARDIAC) 20 MG/ML IV SOLN
INTRAVENOUS | Status: DC | PRN
  Administered 2015-02-26: 60 mg via INTRAVENOUS

## 2015-02-26 MED ORDER — FENTANYL CITRATE (PF) 100 MCG/2ML IJ SOLN
INTRAMUSCULAR | Status: DC | PRN
  Administered 2015-02-26 (×2): 50 ug via INTRAVENOUS

## 2015-02-26 MED ORDER — ROCURONIUM BROMIDE 100 MG/10ML IV SOLN
INTRAVENOUS | Status: DC | PRN
  Administered 2015-02-26: 5 mg via INTRAVENOUS

## 2015-02-26 MED ORDER — ONDANSETRON HCL 4 MG/2ML IJ SOLN
INTRAMUSCULAR | Status: DC | PRN
  Administered 2015-02-26: 4 mg via INTRAVENOUS

## 2015-02-26 NOTE — Transfer of Care (Signed)
Immediate Anesthesia Transfer of Care Note  Patient: Collin Gonzales  Procedure(s) Performed: Procedure(s): ENDOBRONCHIAL ULTRASOUND (N/A)  Patient Location: PACU  Anesthesia Type:General  Level of Consciousness: awake and patient cooperative  Airway & Oxygen Therapy: Patient Spontanous Breathing and Patient connected to face mask oxygen  Post-op Assessment: Report given to RN  Post vital signs: Reviewed and stable  Last Vitals:  Filed Vitals:   02/26/15 1046 02/26/15 1310  BP: 118/81 122/75  Pulse: 75 91  Temp: 35.6 C 97.63F  Resp: 20 22    Complications: No apparent anesthesia complications

## 2015-02-26 NOTE — Anesthesia Preprocedure Evaluation (Signed)
Anesthesia Evaluation  Patient identified by MRN, date of birth, ID band Patient awake    Reviewed: Allergy & Precautions, H&P , NPO status , Patient's Chart, lab work & pertinent test results, reviewed documented beta blocker date and time   History of Anesthesia Complications Negative for: history of anesthetic complications  Airway Mallampati: I  TM Distance: >3 FB Neck ROM: full    Dental no notable dental hx. (+) Edentulous Upper, Edentulous Lower   Pulmonary shortness of breath and with exertion, neg sleep apnea, COPD,  COPD inhaler and oxygen dependent, Recent URI , Residual Cough, former smoker,    Pulmonary exam normal breath sounds clear to auscultation       Cardiovascular Exercise Tolerance: Good negative cardio ROS Normal cardiovascular exam Rhythm:regular Rate:Normal     Neuro/Psych neg Seizures CVA, Residual Symptoms negative psych ROS   GI/Hepatic Neg liver ROS, GERD  Medicated and Controlled,  Endo/Other  negative endocrine ROS  Renal/GU negative Renal ROS  negative genitourinary   Musculoskeletal   Abdominal   Peds  Hematology negative hematology ROS (+)   Anesthesia Other Findings Past Medical History:   GERD (gastroesophageal reflux disease)                       COPD (chronic obstructive pulmonary disease) (*              Arthritis                                                      Comment:rheumatoid arthritis   Insomnia                                                     History of prostate cancer                                   Reproductive/Obstetrics negative OB ROS                             Anesthesia Physical Anesthesia Plan  ASA: III  Anesthesia Plan: General   Post-op Pain Management:    Induction:   Airway Management Planned:   Additional Equipment:   Intra-op Plan:   Post-operative Plan:   Informed Consent: I have reviewed the patients  History and Physical, chart, labs and discussed the procedure including the risks, benefits and alternatives for the proposed anesthesia with the patient or authorized representative who has indicated his/her understanding and acceptance.   Dental Advisory Given  Plan Discussed with: Anesthesiologist, CRNA and Surgeon  Anesthesia Plan Comments:         Anesthesia Quick Evaluation

## 2015-02-26 NOTE — Anesthesia Procedure Notes (Signed)
Procedure Name: Intubation Date/Time: 02/26/2015 12:16 PM Performed by: Dionne Bucy Pre-anesthesia Checklist: Patient identified, Patient being monitored, Timeout performed, Emergency Drugs available and Suction available Patient Re-evaluated:Patient Re-evaluated prior to inductionOxygen Delivery Method: Circle system utilized Preoxygenation: Pre-oxygenation with 100% oxygen Intubation Type: IV induction Ventilation: Mask ventilation without difficulty Laryngoscope Size: Miller and 2 Grade View: Grade I Tube type: Oral Tube size: 8.5 mm Number of attempts: 1 Airway Equipment and Method: Stylet Placement Confirmation: ETT inserted through vocal cords under direct vision,  positive ETCO2 and breath sounds checked- equal and bilateral Secured at: 22 cm Tube secured with: Tape Dental Injury: Teeth and Oropharynx as per pre-operative assessment

## 2015-02-26 NOTE — Interval H&P Note (Signed)
History and Physical Interval Note:  02/26/2015 11:54 AM  Collin Gonzales  has presented today for surgery, with the diagnosis of PET POSITIVE RIGHT UPPER LOBE NODULE WITH ADENOPATHY  The various methods of treatment have been discussed with the patient and family. After consideration of risks, benefits and other options for treatment, the patient has consented to  Procedure(s): ENDOBRONCHIAL ULTRASOUND (N/A) as a surgical intervention .  The patient's history has been reviewed, patient examined, no change in status, stable for surgery.  I have reviewed the patient's chart and labs.  Questions were answered to the patient's satisfaction.     Flora Lipps

## 2015-02-26 NOTE — H&P (View-Only) (Signed)
[place]  [phone] [phone]  ID: [name]: 04/28/1935  MR#: [id number]  AOZ#:[id number]  Patient Care Team: Roselee Nova, MD as PCP - General (Family Medicine)  CHIEF COMPLAINT:  Chief Complaint  Patient presents with  . Lung Mass    INTERVAL HISTORY: Patient was initially evaluated in clinic on December 30, 2014 after a referral for MGUS. Recently patient had CT scan and PET scan that revealed a lesion concerning for malignancy. He currently feels well and is at his baseline. He has chronic shortness of breath and wears oxygen 24 hours per day. He has no neurologic complaints. He denies any recent fevers or illnesses. He has a good appetite and denies weight loss. He denies any pain. He has no chest pain. He denies any nausea, vomiting, constipation, or diarrhea. He has no urinary complaints. Patient offers no specific complaints today.  REVIEW OF SYSTEMS:   Review of Systems  Constitutional: Negative.  Negative for fever and malaise/fatigue.  Respiratory: Positive for shortness of breath. Negative for cough and hemoptysis.   Cardiovascular: Negative.  Negative for chest pain.  Gastrointestinal: Negative.   Musculoskeletal: Negative.   Neurological: Negative.  Negative for weakness.    As per HPI. Otherwise, a complete review of systems is negatve.  PAST MEDICAL HISTORY: Past Medical History  Diagnosis Date  . GERD (gastroesophageal reflux disease)   . COPD (chronic obstructive pulmonary disease) ([place])   . Arthritis     rheumatoid arthritis  . Insomnia   . History of prostate cancer     PAST SURGICAL HISTORY: Past Surgical History  Procedure Laterality Date  . Appendectomy  1965  . Hernia repair  1965  . Biopsy breast Right 2006    FAMILY HISTORY Family History  Problem Relation Age of Onset  . Cancer Mother   . Cancer Sister   . Cancer Brother   . Cancer Father     Lung Cancer       ADVANCED  DIRECTIVES:    HEALTH MAINTENANCE: Social History  Substance Use Topics  . Smoking status: Former Smoker    Quit date: 02/21/2013  . Smokeless tobacco: Not on file  . Alcohol Use: No     Colonoscopy:  PAP:  Bone density:  Lipid panel:  Allergies  Allergen Reactions  . Hydroxychloroquine Sulfate Other (See Comments)    Other reaction(s): Dizziness    Current Outpatient Prescriptions  Medication Sig Dispense Refill  . albuterol (PROAIR HFA) 108 (90 BASE) MCG/ACT inhaler Inhale into the lungs.    . B COMPLEX-C PO Take by mouth.    . Fluticasone-Salmeterol (ADVAIR DISKUS) 250-50 MCG/DOSE AEPB Inhale into the lungs.    . methotrexate 2.5 MG tablet Take by mouth.    Marland Kitchen omeprazole (PRILOSEC) 40 MG capsule Take by mouth.    . simvastatin (ZOCOR) 10 MG tablet Take 1 tablet (10 mg total) by mouth at bedtime. 90 tablet 3  . tiotropium (SPIRIVA HANDIHALER) 18 MCG inhalation capsule Place into inhaler and inhale.    . traZODone (DESYREL) 50 MG tablet Take 1 tablet (50 mg total) by mouth at bedtime. 90 tablet 1  . folic acid (FOLVITE) 1 MG tablet Take by mouth. Reported on 02/19/2015    . sildenafil (VIAGRA) 50 MG tablet Take by mouth. Reported on 02/19/2015     No current facility-administered medications for this visit.    OBJECTIVE: Filed Vitals:   02/19/15 1116  BP: 147/87  Pulse: 80  Temp: 97.7 F (36.5 C)  Resp: 18     Body mass index is 20.79 kg/(m^2).    ECOG FS:1 - Symptomatic but completely ambulatory  General: Well-developed, well-nourished, no acute distress. Eyes: Pink conjunctiva, anicteric sclera. Lungs: Clear to auscultation bilaterally. Heart: Regular rate and rhythm. No rubs, murmurs, or gallops. Abdomen: Soft, nontender, nondistended. No organomegaly noted, normoactive bowel sounds. Musculoskeletal: No edema, cyanosis, or clubbing. Neuro: Alert, answering all questions appropriately. Cranial nerves grossly intact. Skin: No rashes or petechiae noted. Psych:  Normal affect.   LAB RESULTS:  Lab Results  Component Value Date   NA 138 12/30/2014   K 4.1 12/30/2014   CL 104 12/30/2014   CO2 27 12/30/2014   GLUCOSE 92 12/30/2014   BUN 22* 12/30/2014   CREATININE 1.08 12/30/2014   CALCIUM 9.5 12/30/2014   PROT 8.6* 12/08/2014   ALBUMIN 3.7 12/08/2014   AST 18 12/08/2014   ALT 9 12/08/2014   ALKPHOS 112 12/08/2014   BILITOT 0.3 12/08/2014   GFRNONAA >60 12/30/2014   GFRAA >60 12/30/2014    Lab Results  Component Value Date   WBC 5.7 12/30/2014   NEUTROABS 3.6 12/30/2014   HGB 12.4* 12/30/2014   HCT 38.4* 12/30/2014   MCV 92.9 12/30/2014   PLT 268 12/30/2014     STUDIES: Nm Pet Image Initial (pi) Skull Base To Thigh  02/12/2015  CLINICAL DATA:  Initial treatment strategy for enlarging right upper lobe pulmonary nodule. EXAM: NUCLEAR MEDICINE PET SKULL BASE TO THIGH TECHNIQUE: 13.3 mCi F-18 FDG was injected intravenously. Full-ring PET imaging was performed from the skull base to thigh after the radiotracer. CT data was obtained and used for attenuation correction and anatomic localization. FASTING BLOOD GLUCOSE:  Value: 75 mg/dl COMPARISON:  01/14/2015 chest CT. FINDINGS: NECK No hypermetabolic lymph nodes in the neck. There is nonspecific mild asymmetric hypermetabolism in the right glottis (max SUV 4.3) without discrete mass on the CT images. Mucosal thickening is present throughout the inferior right maxillary sinus and posterior left sphenoid sinus. There is partial opacification of the bilateral ethmoidal air cells. CHEST There is mild hypermetabolic right paratracheal lymphadenopathy, for example a mildly enlarged 1.0 cm hypermetabolic right paratracheal node (series 3/image 93) with max SUV 4.7. There is a mildly enlarged hypermetabolic 1.2 cm subcarinal node (series 3/image 105) with max SUV 5.0. There is hypermetabolic bilateral hilar lymphadenopathy. For example a mildly enlarged 1.1 cm left hilar hypermetabolic node (series  3/image 114) demonstrates max SUV 5.2. A hypermetabolic mildly enlarged right hilar node measuring approximately 1.3 cm (series 3/image 108) demonstrates max SUV 5.3. There is a spiculated hypermetabolic 1.2 x 0.8 cm pulmonary nodule in the posterior right upper lobe (series 3/image 72) with max SUV 4.4. The previously described 1.2 x 0.6 cm anterior right upper lobe pulmonary nodule (series 3/image 80) is stable in size and demonstrates no significant metabolism. The previously described 1.2 x 0.4 cm right middle lobe pulmonary nodule (series 3/image 125) is stable in size and demonstrates no significant metabolism. There is a 0.6 cm posterior left upper lobe solid pulmonary nodule (series 3/image 96), stable in size and without significant metabolism. No acute consolidative airspace disease or new significant pulmonary nodules. No hypermetabolic axillary nodes. Left main, left anterior descending, left circumflex and right coronary atherosclerosis. Moderate centrilobular and paraseptal emphysema with diffuse bronchial wall thickening. ABDOMEN/PELVIS No abnormal hypermetabolic activity within the liver, pancreas, adrenal glands, or spleen. No hypermetabolic lymph nodes in the abdomen or pelvis. Granulomatous calcification in  the posterior right liver lobe. Brachytherapy seeds are seen throughout the normal size prostate, with no appreciable prostatic hypermetabolism. Atherosclerotic nonaneurysmal abdominal aorta. There are bilateral hydroceles in the scrotum. SKELETON No focal hypermetabolic activity to suggest skeletal metastasis. Mild hypermetabolism between the L4 and L5 spinous processes is favored to be degenerative in etiology, with no discrete osseous lesion on the CT images in this location. IMPRESSION: 1. Hypermetabolic spiculated 1.2 cm posterior right upper lobe pulmonary nodule, in keeping with primary bronchogenic carcinoma. 2. Hypermetabolic ipsilateral hilar, ipsilateral mediastinal and contralateral  hilar lymphadenopathy, representing metastatic disease until otherwise proven. 3. No hypermetabolic metastatic disease in the neck, abdomen or pelvis. 4. Nonspecific mild asymmetric hypermetabolism in the right glottis without discrete mass on the CT images, which may be physiologic, however a right glottic neoplasm cannot be entirely excluded. Consider correlation with laryngoscopy. 5. Additional findings including paranasal sinusitis, coronary atherosclerosis, moderate emphysema and bilateral hydroceles in the scrotum. Electronically Signed   By: Ilona Sorrel M.D.   On: 02/12/2015 12:50    ASSESSMENT: Lung mass, highly suspicious for malignancy. MGUS.   PLAN:    1. Lung mass: CT and PET scan results reviewed independently with concerning 1.2 cm lesion in patient's right upper lobe. Patient also has ipsilateral and contralateral PET positive lymphadenopathy, concerning for N3 disease. Patient will require biopsy to confirm the diagnosis and a referral has been made to pulmonology for consideration of a ENB. Patient will return to clinic one week after his biopsy to discuss the results and treatment planning. 2.  Polyclonal gammopathy. Patient's IgG, IgA, and IgM levels are all elevated. He does not have an M spike on his SPEP. The remainder of his laboratory work is either negative or within normal limits. Polyclonal gammopathy is typically a benign disorder and does not require further intervention.  Approximately 30 minutes was spent in discussion of which greater than 50% was consultation.  Patient expressed understanding and was in agreement with this plan. He also understands that He can call clinic at any time with any questions, concerns, or complaints.    Lloyd Huger, MD   02/23/2015 11:44 AM

## 2015-02-26 NOTE — Discharge Instructions (Signed)
Flexible Bronchoscopy, Care After Refer to this sheet in the next few weeks. These instructions provide you with information on caring for yourself after your procedure. Your health care provider may also give you more specific instructions. Your treatment has been planned according to current medical practices, but problems sometimes occur. Call your health care provider if you have any problems or questions after your procedure.  WHAT TO EXPECT AFTER THE PROCEDURE It is normal to have the following symptoms for 24-48 hours after the procedure:   Increased cough.  Low-grade fever.  Sore throat or hoarse voice.  Small streaks of blood in your thick spit (sputum) if tissue samples were taken (biopsy). HOME CARE INSTRUCTIONS   Do not eat or drink anything for 2 hours after your procedure. Your nose and throat were numbed by medicine. If you try to eat or drink before the medicine wears off, food or drink could go into your lungs or you could burn yourself. After the numbness is gone and your cough and gag reflexes have returned, you may eat soft food and drink liquids slowly.   The day after the procedure, you can go back to your normal diet.   You may resume normal activities.   Keep all follow-up visits as directed by your health care provider. It is important to keep all your appointments, especially if tissue samples were taken for testing (biopsy). SEEK IMMEDIATE MEDICAL CARE IF:   You have increasing shortness of breath.   You become light-headed or faint.   You have chest pain.   You have any new concerning symptoms.  You cough up more than a small amount of blood.  The amount of blood you cough up increases. MAKE SURE YOU:  Understand these instructions.  Will watch your condition.  Will get help right away if you are not doing well or get worse.   This information is not intended to replace advice given to you by your health care provider. Make sure you discuss  any questions you have with your health care provider.   Document Released: 08/27/2004 Document Revised: 02/28/2014 Document Reviewed: 10/12/2012 Elsevier Interactive Patient Education 2016 [place]   1) The drugs that you were given will stay in your system until tomorrow so for the next 24 hours you should not:  A) Drive an automobile B) Make any legal decisions C) Drink any alcoholic beverage   2) You may resume regular meals tomorrow.  Today it is better to start with liquids and gradually work up to solid foods.  You may eat anything you prefer, but it is better to start with liquids, then soup and crackers, and gradually work up to solid foods.   3) Please notify your doctor immediately if you have any unusual bleeding, trouble breathing, redness and pain at the surgery site, drainage, fever, or pain not relieved by medication. 4)   5) Your post-operative visit with Dr.                                     is: Date:                        Time:    Please call to schedule your post-operative visit.  6) Additional Instructions: 7)

## 2015-02-26 NOTE — Op Note (Signed)
PROCEDURE: ENDOBRONCHIAL ULTRASOUND   PROCEDURE DATE: 02/26/2015  TIME:  NAME:  ARMARI FUSSELL  DOB:[birth year]  MRN: [id number] LOC:  ARPO/None    HOSP DAY: '@LENGTHOFSTAYDAYS'$ @ CODE STATUS:        Indications/Preliminary Diagnosis:   Consent: (Place X beside choice/s below)  The benefits, risks and possible complications of the procedure were        explained to:  __x_ patient  __x_ patient's family  ___ other:___________  who verbalized understanding and gave:  ___ verbal  ___ written  _x__ verbal and written  ___ telephone  ___ other:________ consent.      Unable to obtain consent; procedure performed on emergent basis.     Other:       PRESEDATION ASSESSMENT: History and Physical has been performed. Patient meds and allergies have been reviewed. Presedation airway examination has been performed and documented. Baseline vital signs, sedation score, oxygenation status, and cardiac rhythm were reviewed. Patient was deemed to be in satisfactory condition to undergo the procedure.    PREMEDICATIONS: SEE ANESTHESIOLOGY RECORDS   Airway Prep (Place X beside choice below)   1% Transtracheal Lidocaine Anesthetization 7 cc   Patient prepped per Bronchoscopy Lab Policy       Insertion Route (Place X beside choice below)   Nasal   Oral  x Endotracheal Tube   Tracheostomy   INTRAPROCEDURE MEDICATIONS: SEE ANESTHESIOLOGY RECORDS   PROCEDURE DETAILS: Timeout performed and correct patient, name, & ID confirmed. Following prep per Pulmonary policy, appropriate sedation was administered.  Airway exam proceeded with findings, technical procedures, and specimen collection as noted below. At the end of exam the scope was withdrawn without incident. Impression and Plan as noted below.   I WAS ABLE TO VISUALIZE LARGE SUBCARINAL LN approx 2x3 CM and also  LEFT HILAR LN approx 2x3 CM I have obtained samples TRANSBRONCHIAL FNA  from both areas with 21 gauge wang needle    TECHNICAL  PROCEDURES: (Place X beside choice below)   Procedures  Description    None     Electrocautery     Cryotherapy     Balloon Dilatation     Bronchography     Stent Placement     Therapeutic Aspiration     Laser/Argon Plasma    Brachytherapy Catheter Placement    Foreign Body Removal     SPECIMENS (Sites): (Place X beside choice below)  Specimens Description   No Specimens Obtained     [place]    Lavage    Biopsies   x Fine Needle Aspirates 3 x subcarina, 3 x left hilar   Brushings    Sputum    ESTIMATED BLOOD LOSS: none COMPLICATIONS/RESOLUTION: none       IMPRESSION:POST-PROCEDURE DX: LAD-await path reports    RECOMMENDATION/PLAN: follow up pathology reports     Delara Shepheard Patricia Pesa, M.D.  [facility]  Medical Director [place] Director [place] Department

## 2015-02-27 LAB — CYTOLOGY - NON PAP

## 2015-02-27 NOTE — Anesthesia Postprocedure Evaluation (Signed)
Anesthesia Post Note  Patient: Collin Gonzales  Procedure(s) Performed: Procedure(s) (LRB): ENDOBRONCHIAL ULTRASOUND (N/A)  Patient location during evaluation: PACU Anesthesia Type: General Level of consciousness: awake and alert Pain management: pain level controlled Vital Signs Assessment: post-procedure vital signs reviewed and stable Respiratory status: spontaneous breathing, nonlabored ventilation, respiratory function stable and patient connected to nasal cannula oxygen Cardiovascular status: blood pressure returned to baseline and stable Postop Assessment: no signs of nausea or vomiting Anesthetic complications: no    Last Vitals:  Filed Vitals:   02/26/15 1406 02/26/15 1416  BP: 120/67   Pulse: 63 67  Temp: 36.2 C   Resp: 20 20    Last Pain:  Filed Vitals:   02/27/15 0839  PainSc: 0-No pain                 Martha Clan

## 2015-03-04 ENCOUNTER — Other Ambulatory Visit: Payer: Self-pay | Admitting: Oncology

## 2015-03-04 DIAGNOSIS — R918 Other nonspecific abnormal finding of lung field: Secondary | ICD-10-CM

## 2015-03-06 ENCOUNTER — Other Ambulatory Visit: Payer: Self-pay | Admitting: Oncology

## 2015-03-06 ENCOUNTER — Telehealth: Payer: Self-pay | Admitting: *Deleted

## 2015-03-06 DIAGNOSIS — R918 Other nonspecific abnormal finding of lung field: Secondary | ICD-10-CM

## 2015-03-06 NOTE — Telephone Encounter (Signed)
After discussion regarding risks of repeat biopsy attempt with pulmonary, interventional radiology, medical oncology, and thoracic surgery, recommendation has been made for 3 month follow up CT scan with consideration of repeat EBUS biopsy at that time. Notified patient of this and patient is in agreement with this plan. He is expecting appointments for CT scan in 3 months and follow up afterward. He knows to call for any change in health or if he becomes symptomatic.

## 2015-03-09 DIAGNOSIS — J449 Chronic obstructive pulmonary disease, unspecified: Secondary | ICD-10-CM | POA: Diagnosis not present

## 2015-03-10 ENCOUNTER — Ambulatory Visit (INDEPENDENT_AMBULATORY_CARE_PROVIDER_SITE_OTHER): Payer: Commercial Managed Care - HMO | Admitting: Family Medicine

## 2015-03-10 ENCOUNTER — Encounter: Payer: Self-pay | Admitting: Family Medicine

## 2015-03-10 VITALS — BP 138/62 | HR 75 | Temp 98.0°F | Resp 18 | Ht 68.0 in | Wt 134.2 lb

## 2015-03-10 DIAGNOSIS — Z9189 Other specified personal risk factors, not elsewhere classified: Secondary | ICD-10-CM | POA: Diagnosis not present

## 2015-03-10 NOTE — Progress Notes (Signed)
Name: Collin Gonzales   MRN: [id number]    DOB: 31-Mar-1935   Date:03/10/2015       Progress Note  Subjective  Chief Complaint  Chief Complaint  Patient presents with  . pt here for 3 month follow up for fasting lab work    HPI  Pt. Is here for evaluation of Lipid Profile. Lipids obtained in  October 2016 were as follows TC 155, TG 62, HDL 45, LDL 98 10-year risk of CVD was estimated to be 17 %. He was started on low dose Simvastatin ('10mg'$ ) for secondary prevention but could not take the Simvastatin due to abnormal dreams. This went away after he stopped taking Simvastatin. Here to repeat Lipids today  Past Medical History  Diagnosis Date  . GERD (gastroesophageal reflux disease)   . COPD (chronic obstructive pulmonary disease) ([place])   . Arthritis     rheumatoid arthritis  . Insomnia   . History of prostate cancer     Past Surgical History  Procedure Laterality Date  . Appendectomy  1965  . Hernia repair  1965  . Endobronchial ultrasound N/A 02/26/2015    Procedure: ENDOBRONCHIAL ULTRASOUND;  Surgeon: Flora Lipps, MD;  Location: [facility] ORS;  Service: Cardiopulmonary;  Laterality: N/A;    Family History  Problem Relation Age of Onset  . Cancer Mother   . Cancer Sister   . Cancer Brother   . Cancer Father     Lung Cancer    Social History   Social History  . Marital Status: Single    Spouse Name: N/A  . Number of Children: N/A  . Years of Education: N/A   Occupational History  . Not on file.   Social History Main Topics  . Smoking status: Former Smoker    Quit date: 02/21/2013  . Smokeless tobacco: Not on file  . Alcohol Use: No  . Drug Use: No  . Sexual Activity: Not on file   Other Topics Concern  . Not on file   Social History Narrative     Current outpatient prescriptions:  .  albuterol (PROAIR HFA) 108 (90 BASE) MCG/ACT inhaler, Inhale 2 puffs into the lungs every 4 (four) hours as needed for wheezing. , Disp: , Rfl:  .  B COMPLEX-C PO, Take by  mouth., Disp: , Rfl:  .  Fluticasone-Salmeterol (ADVAIR DISKUS) 250-50 MCG/DOSE AEPB, Inhale 1 puff into the lungs every morning. , Disp: , Rfl:  .  folic acid (FOLVITE) 1 MG tablet, Take by mouth. Reported on 02/19/2015, Disp: , Rfl:  .  methotrexate 2.5 MG tablet, Take 21.5 mg by mouth once a week. , Disp: , Rfl:  .  omeprazole (PRILOSEC) 40 MG capsule, Take by mouth., Disp: , Rfl:  .  sildenafil (VIAGRA) 50 MG tablet, Take by mouth. Reported on 02/19/2015, Disp: , Rfl:  .  tiotropium (SPIRIVA HANDIHALER) 18 MCG inhalation capsule, Place into inhaler and inhale 2 (two) times daily. , Disp: , Rfl:  .  traZODone (DESYREL) 50 MG tablet, Take 1 tablet (50 mg total) by mouth at bedtime., Disp: 90 tablet, Rfl: 1  Allergies  Allergen Reactions  . Plaquenil [Hydroxychloroquine]   . Simvastatin Other (See Comments)    Bad dreams     Review of Systems  Constitutional: Negative for fever and chills.  Respiratory: Negative for cough and shortness of breath.   Cardiovascular: Negative for chest pain and palpitations.  Gastrointestinal: Negative for abdominal pain.  Musculoskeletal: Negative for myalgias and back pain.  Objective  Filed Vitals:   03/10/15 0944  BP: 138/62  Pulse: 75  Temp: 98 F (36.7 C)  Resp: 18  Height: '5\' 8"'$  (1.727 m)  Weight: 134 lb 4 oz (60.895 kg)  SpO2: 92%    Physical Exam  Constitutional: He is oriented to person, place, and time and well-developed, well-nourished, and in no distress.  Cardiovascular: Normal rate and regular rhythm.   No murmur heard. Pulmonary/Chest: Effort normal. No respiratory distress. He has decreased breath sounds. He has no wheezes. He has no rhonchi.  Abdominal: Soft. Bowel sounds are normal. He exhibits no distension. There is no tenderness.  Neurological: He is alert and oriented to person, place, and time.  Nursing note and vitals reviewed.    Assessment & Plan  1. Candidate for statin therapy due to risk of future  cardiovascular event  - Lipid Profile   Damariz Paganelli [facility] 03/10/2015 10:00 AM

## 2015-03-11 LAB — LIPID PANEL
CHOL/HDL RATIO: 2.9 ratio (ref 0.0–5.0)
CHOLESTEROL TOTAL: 154 mg/dL (ref 100–199)
HDL: 53 mg/dL (ref >39)
LDL Calculated: 90 mg/dL (ref 0–99)
TRIGLYCERIDES: 57 mg/dL (ref 0–149)
VLDL Cholesterol Cal: 11 mg/dL (ref 5–40)

## 2015-03-13 ENCOUNTER — Other Ambulatory Visit: Payer: Self-pay

## 2015-03-13 MED ORDER — ROSUVASTATIN CALCIUM 5 MG PO TABS: 5.0000 mg | ORAL_TABLET | Freq: Every day | ORAL | Status: DC

## 2015-03-13 NOTE — Telephone Encounter (Signed)
A prescription for Crestor 5 mg has been sent to [facility] per Dr. Manuella Ghazi, patient has been notified

## 2015-04-09 DIAGNOSIS — J449 Chronic obstructive pulmonary disease, unspecified: Secondary | ICD-10-CM | POA: Diagnosis not present

## 2015-04-22 ENCOUNTER — Other Ambulatory Visit: Payer: Commercial Managed Care - HMO

## 2015-04-29 ENCOUNTER — Telehealth: Payer: Self-pay

## 2015-04-29 ENCOUNTER — Ambulatory Visit: Payer: Commercial Managed Care - HMO | Admitting: Oncology

## 2015-04-29 ENCOUNTER — Other Ambulatory Visit: Payer: Commercial Managed Care - HMO

## 2015-04-29 NOTE — Telephone Encounter (Signed)
Dr. Cynda Familia office called asking me to obtain a [facility] Referral for this patient. He was supposed to have labs done today. Auth Obtained Auth # [id number] Start - 04/29/15 Expires- 10/26/2015 ICD-10: P29.51

## 2015-05-06 DIAGNOSIS — C61 Malignant neoplasm of prostate: Secondary | ICD-10-CM | POA: Diagnosis not present

## 2015-05-06 DIAGNOSIS — R938 Abnormal findings on diagnostic imaging of other specified body structures: Secondary | ICD-10-CM | POA: Diagnosis not present

## 2015-05-06 DIAGNOSIS — R700 Elevated erythrocyte sedimentation rate: Secondary | ICD-10-CM | POA: Diagnosis not present

## 2015-05-06 DIAGNOSIS — Z79899 Other long term (current) drug therapy: Secondary | ICD-10-CM | POA: Diagnosis not present

## 2015-05-06 DIAGNOSIS — M0579 Rheumatoid arthritis with rheumatoid factor of multiple sites without organ or systems involvement: Secondary | ICD-10-CM | POA: Diagnosis not present

## 2015-05-07 DIAGNOSIS — J449 Chronic obstructive pulmonary disease, unspecified: Secondary | ICD-10-CM | POA: Diagnosis not present

## 2015-05-12 ENCOUNTER — Ambulatory Visit (INDEPENDENT_AMBULATORY_CARE_PROVIDER_SITE_OTHER): Payer: Commercial Managed Care - HMO | Admitting: Family Medicine

## 2015-05-12 ENCOUNTER — Encounter: Payer: Self-pay | Admitting: Family Medicine

## 2015-05-12 VITALS — BP 140/68 | HR 84 | Temp 98.7°F | Resp 17 | Ht 68.0 in | Wt 140.0 lb

## 2015-05-12 DIAGNOSIS — J209 Acute bronchitis, unspecified: Secondary | ICD-10-CM

## 2015-05-12 MED ORDER — AZITHROMYCIN 250 MG PO TABS: ORAL_TABLET | ORAL | Status: DC

## 2015-05-12 NOTE — Progress Notes (Signed)
Name: Collin Gonzales   MRN: [id number]    DOB: 1935/06/06   Date:05/12/2015       Progress Note  Subjective  Chief Complaint  Chief Complaint  Patient presents with  . Cough    congestion x4 days    Cough This is a new problem. The cough is productive of sputum and productive of purulent sputum. Associated symptoms include a sore throat. Pertinent negatives include no chest pain, chills, fever, headaches or shortness of breath. His past medical history is significant for COPD.   Onset: 5 days ago, initially clear mucus, changed to yellowish, shortness of breath at baseline.   Past Medical History  Diagnosis Date  . GERD (gastroesophageal reflux disease)   . COPD (chronic obstructive pulmonary disease) ([place])   . Arthritis     rheumatoid arthritis  . Insomnia   . History of prostate cancer     Past Surgical History  Procedure Laterality Date  . Appendectomy  1965  . Hernia repair  1965  . Endobronchial ultrasound N/A 02/26/2015    Procedure: ENDOBRONCHIAL ULTRASOUND;  Surgeon: Flora Lipps, MD;  Location: [facility] ORS;  Service: Cardiopulmonary;  Laterality: N/A;    Family History  Problem Relation Age of Onset  . Cancer Mother   . Cancer Sister   . Cancer Brother   . Cancer Father     Lung Cancer    Social History   Social History  . Marital Status: Single    Spouse Name: N/A  . Number of Children: N/A  . Years of Education: N/A   Occupational History  . Not on file.   Social History Main Topics  . Smoking status: Former Smoker    Quit date: 02/21/2013  . Smokeless tobacco: Not on file  . Alcohol Use: No  . Drug Use: No  . Sexual Activity: Not on file   Other Topics Concern  . Not on file   Social History Narrative     Current outpatient prescriptions:  .  albuterol (PROAIR HFA) 108 (90 BASE) MCG/ACT inhaler, Inhale 2 puffs into the lungs every 4 (four) hours as needed for wheezing. , Disp: , Rfl:  .  B COMPLEX-C PO, Take by mouth., Disp: , Rfl:  .   Fluticasone-Salmeterol (ADVAIR DISKUS) 250-50 MCG/DOSE AEPB, Inhale 1 puff into the lungs every morning. , Disp: , Rfl:  .  folic acid (FOLVITE) 1 MG tablet, Take by mouth. Reported on 02/19/2015, Disp: , Rfl:  .  methotrexate 2.5 MG tablet, Take 21.5 mg by mouth once a week. , Disp: , Rfl:  .  omeprazole (PRILOSEC) 40 MG capsule, Take by mouth., Disp: , Rfl:  .  rosuvastatin (CRESTOR) 5 MG tablet, Take 1 tablet (5 mg total) by mouth at bedtime., Disp: 90 tablet, Rfl: 0 .  sildenafil (VIAGRA) 50 MG tablet, Take by mouth. Reported on 02/19/2015, Disp: , Rfl:  .  tiotropium (SPIRIVA HANDIHALER) 18 MCG inhalation capsule, Place into inhaler and inhale 2 (two) times daily. , Disp: , Rfl:  .  traZODone (DESYREL) 50 MG tablet, Take 1 tablet (50 mg total) by mouth at bedtime., Disp: 90 tablet, Rfl: 1  Allergies  Allergen Reactions  . Plaquenil [Hydroxychloroquine]   . Simvastatin Other (See Comments)    Bad dreams     Review of Systems  Constitutional: Negative for fever and chills.  HENT: Positive for sore throat.   Respiratory: Positive for cough and sputum production. Negative for shortness of breath.   Cardiovascular: Negative  for chest pain.  Neurological: Negative for headaches.    Objective  Filed Vitals:   05/12/15 1413  BP: 140/68  Pulse: 84  Temp: 98.7 F (37.1 C)  TempSrc: Oral  Resp: 17  Height: '5\' 8"'$  (1.727 m)  Weight: 140 lb (63.504 kg)  SpO2: 94%    Physical Exam  Constitutional: He is well-developed, well-nourished, and in no distress.  HENT:  Right Ear: Tympanic membrane and ear canal normal.  Left Ear: Tympanic membrane and ear canal normal.  Mouth/Throat: Posterior oropharyngeal erythema present.  Mucus drainage visible at oropharynx.   Cardiovascular: Normal rate and regular rhythm.   Pulmonary/Chest: He has decreased breath sounds. He has no wheezes. He has no rhonchi.  Breath sounds decreased, especially expiratory cycle, no crackles, no wheezes.    Nursing note and vitals reviewed.     Assessment & Plan  1. Acute bronchitis, unspecified organism Likely has acute bronchitis, with some postnasal drainage. We will start on Z-Pak to treat. Follow-up if no clinical improvement. - azithromycin (ZITHROMAX Z-PAK) 250 MG tablet; 2 tabs po x day 1, then 1 tab po q day x 4 days  Dispense: 6 each; Refill: 0   Hugo Lybrand [facility] 05/12/2015 2:56 PM

## 2015-05-21 ENCOUNTER — Ambulatory Visit: Payer: Commercial Managed Care - HMO | Admitting: Family Medicine

## 2015-06-05 ENCOUNTER — Ambulatory Visit
Admission: RE | Admit: 2015-06-05 | Discharge: 2015-06-05 | Disposition: A | Discharge status: Home / Self Care | Payer: Commercial Managed Care - HMO | Source: Ambulatory Visit | Attending: Oncology | Admitting: Oncology

## 2015-06-05 ENCOUNTER — Inpatient Hospital Stay: Payer: Commercial Managed Care - HMO | Attending: Oncology

## 2015-06-05 DIAGNOSIS — R0609 Other forms of dyspnea: Secondary | ICD-10-CM | POA: Insufficient documentation

## 2015-06-05 DIAGNOSIS — R918 Other nonspecific abnormal finding of lung field: Secondary | ICD-10-CM | POA: Diagnosis not present

## 2015-06-05 DIAGNOSIS — J439 Emphysema, unspecified: Secondary | ICD-10-CM | POA: Insufficient documentation

## 2015-06-05 DIAGNOSIS — E8809 Other disorders of plasma-protein metabolism, not elsewhere classified: Secondary | ICD-10-CM

## 2015-06-05 DIAGNOSIS — K219 Gastro-esophageal reflux disease without esophagitis: Secondary | ICD-10-CM | POA: Insufficient documentation

## 2015-06-05 DIAGNOSIS — M069 Rheumatoid arthritis, unspecified: Secondary | ICD-10-CM | POA: Insufficient documentation

## 2015-06-05 DIAGNOSIS — R911 Solitary pulmonary nodule: Secondary | ICD-10-CM | POA: Diagnosis not present

## 2015-06-05 DIAGNOSIS — Z87891 Personal history of nicotine dependence: Secondary | ICD-10-CM | POA: Insufficient documentation

## 2015-06-05 DIAGNOSIS — J449 Chronic obstructive pulmonary disease, unspecified: Secondary | ICD-10-CM | POA: Insufficient documentation

## 2015-06-05 DIAGNOSIS — Z8546 Personal history of malignant neoplasm of prostate: Secondary | ICD-10-CM | POA: Insufficient documentation

## 2015-06-05 DIAGNOSIS — I2510 Atherosclerotic heart disease of native coronary artery without angina pectoris: Secondary | ICD-10-CM | POA: Diagnosis not present

## 2015-06-05 DIAGNOSIS — I7090 Unspecified atherosclerosis: Secondary | ICD-10-CM | POA: Insufficient documentation

## 2015-06-05 DIAGNOSIS — D890 Polyclonal hypergammaglobulinemia: Secondary | ICD-10-CM | POA: Insufficient documentation

## 2015-06-05 DIAGNOSIS — Z79899 Other long term (current) drug therapy: Secondary | ICD-10-CM | POA: Diagnosis not present

## 2015-06-05 DIAGNOSIS — R779 Abnormality of plasma protein, unspecified: Secondary | ICD-10-CM

## 2015-06-05 DIAGNOSIS — Z9981 Dependence on supplemental oxygen: Secondary | ICD-10-CM | POA: Diagnosis not present

## 2015-06-05 DIAGNOSIS — R590 Localized enlarged lymph nodes: Secondary | ICD-10-CM | POA: Insufficient documentation

## 2015-06-05 LAB — CBC
HCT: 37.5 % — ABNORMAL LOW (ref 40.0–52.0)
Hemoglobin: 12.5 g/dL — ABNORMAL LOW (ref 13.0–18.0)
MCH: 30.8 pg (ref 26.0–34.0)
MCHC: 33.4 g/dL (ref 32.0–36.0)
MCV: 92.3 fL (ref 80.0–100.0)
PLATELETS: 275 10*3/uL (ref 150–440)
RBC: 4.06 MIL/uL — ABNORMAL LOW (ref 4.40–5.90)
RDW: 16.4 % — AB (ref 11.5–14.5)
WBC: 5.2 10*3/uL (ref 3.8–10.6)

## 2015-06-05 LAB — BASIC METABOLIC PANEL
Anion gap: 3 — ABNORMAL LOW (ref 5–15)
BUN: 24 mg/dL — AB (ref 6–20)
CALCIUM: 8.9 mg/dL (ref 8.9–10.3)
CO2: 27 mmol/L (ref 22–32)
Chloride: 107 mmol/L (ref 101–111)
Creatinine, Ser: 1.28 mg/dL — ABNORMAL HIGH (ref 0.61–1.24)
GFR calc Af Amer: >60 mL/min (ref >60)
GFR, EST NON AFRICAN AMERICAN: 52 mL/min — AB (ref >60)
GLUCOSE: 83 mg/dL (ref 65–99)
Potassium: 4.3 mmol/L (ref 3.5–5.1)
SODIUM: 137 mmol/L (ref 135–145)

## 2015-06-05 MED ORDER — IOPAMIDOL (ISOVUE-300) INJECTION 61%
75.0000 mL | Freq: Once | INTRAVENOUS | Status: AC | PRN
  Administered 2015-06-05: 75 mL via INTRAVENOUS

## 2015-06-07 DIAGNOSIS — J449 Chronic obstructive pulmonary disease, unspecified: Secondary | ICD-10-CM | POA: Diagnosis not present

## 2015-06-08 ENCOUNTER — Inpatient Hospital Stay: Payer: Commercial Managed Care - HMO | Admitting: Oncology

## 2015-06-08 LAB — IMMUNOFIXATION ELECTROPHORESIS
IGG (IMMUNOGLOBIN G), SERUM: 2537 mg/dL — AB (ref 700–1600)
IgA: 693 mg/dL — ABNORMAL HIGH (ref 61–437)
IgM, Serum: 373 mg/dL — ABNORMAL HIGH (ref 15–143)
Total Protein ELP: 7.8 g/dL (ref 6.0–8.5)

## 2015-06-08 LAB — KAPPA/LAMBDA LIGHT CHAINS
KAPPA FREE LGHT CHN: 114.53 mg/L — AB (ref 3.30–19.40)
Kappa, lambda light chain ratio: 1.95 — ABNORMAL HIGH (ref 0.26–1.65)
LAMDA FREE LIGHT CHAINS: 58.83 mg/L — AB (ref 5.71–26.30)

## 2015-06-09 LAB — PROTEIN ELECTROPHORESIS, SERUM
A/G Ratio: 0.8 (ref 0.7–1.7)
ALPHA-1-GLOBULIN: 0.2 g/dL (ref 0.0–0.4)
ALPHA-2-GLOBULIN: 0.6 g/dL (ref 0.4–1.0)
Albumin ELP: 3.4 g/dL (ref 2.9–4.4)
BETA GLOBULIN: 1.4 g/dL — AB (ref 0.7–1.3)
GAMMA GLOBULIN: 2.2 g/dL — AB (ref 0.4–1.8)
Globulin, Total: 4.3 g/dL — ABNORMAL HIGH (ref 2.2–3.9)
Total Protein ELP: 7.7 g/dL (ref 6.0–8.5)

## 2015-06-10 ENCOUNTER — Inpatient Hospital Stay (HOSPITAL_BASED_OUTPATIENT_CLINIC_OR_DEPARTMENT_OTHER): Payer: Commercial Managed Care - HMO | Admitting: Oncology

## 2015-06-10 VITALS — BP 128/76 | HR 73 | Temp 95.9°F | Resp 18 | Wt 135.6 lb

## 2015-06-10 DIAGNOSIS — R590 Localized enlarged lymph nodes: Secondary | ICD-10-CM

## 2015-06-10 DIAGNOSIS — Z79899 Other long term (current) drug therapy: Secondary | ICD-10-CM

## 2015-06-10 DIAGNOSIS — J449 Chronic obstructive pulmonary disease, unspecified: Secondary | ICD-10-CM

## 2015-06-10 DIAGNOSIS — Z9981 Dependence on supplemental oxygen: Secondary | ICD-10-CM

## 2015-06-10 DIAGNOSIS — I2510 Atherosclerotic heart disease of native coronary artery without angina pectoris: Secondary | ICD-10-CM | POA: Diagnosis not present

## 2015-06-10 DIAGNOSIS — R918 Other nonspecific abnormal finding of lung field: Secondary | ICD-10-CM

## 2015-06-10 DIAGNOSIS — D890 Polyclonal hypergammaglobulinemia: Secondary | ICD-10-CM

## 2015-06-10 DIAGNOSIS — Z8546 Personal history of malignant neoplasm of prostate: Secondary | ICD-10-CM

## 2015-06-10 DIAGNOSIS — K219 Gastro-esophageal reflux disease without esophagitis: Secondary | ICD-10-CM | POA: Diagnosis not present

## 2015-06-10 DIAGNOSIS — Z87891 Personal history of nicotine dependence: Secondary | ICD-10-CM

## 2015-06-10 DIAGNOSIS — R0609 Other forms of dyspnea: Secondary | ICD-10-CM

## 2015-06-10 DIAGNOSIS — M069 Rheumatoid arthritis, unspecified: Secondary | ICD-10-CM

## 2015-06-10 NOTE — Progress Notes (Signed)
[place]  [phone] Fax:(336) [phone]  ID: [name]: 23-Jan-1936  MR#: [id number]  EXH#:[id number]  Patient Care Team: Roselee Nova, MD as PCP - General (Family Medicine)  CHIEF COMPLAINT:  Chief Complaint  Patient presents with  . Results    INTERVAL HISTORY: Patient Returns to clinic today for further evaluation and discussion of his imaging results. He continues to have chronic shortness of breath and dyspnea exertion, but otherwise feels well. He has no neurologic complaints. He denies any recent fevers or illnesses. He has a good appetite and denies weight loss. He denies any pain. He has no chest pain. He denies any nausea, vomiting, constipation, or diarrhea. He has no urinary complaints. Patient offers no further specific complaints today.  REVIEW OF SYSTEMS:   Review of Systems  Constitutional: Negative.  Negative for fever and malaise/fatigue.  Respiratory: Positive for shortness of breath. Negative for cough and hemoptysis.   Cardiovascular: Negative.  Negative for chest pain.  Gastrointestinal: Negative.   Musculoskeletal: Negative.   Neurological: Negative.  Negative for weakness.    As per HPI. Otherwise, a complete review of systems is negatve.  PAST MEDICAL HISTORY: Past Medical History  Diagnosis Date  . GERD (gastroesophageal reflux disease)   . COPD (chronic obstructive pulmonary disease) ([place])   . Arthritis     rheumatoid arthritis  . Insomnia   . History of prostate cancer     PAST SURGICAL HISTORY: Past Surgical History  Procedure Laterality Date  . Appendectomy  1965  . Hernia repair  1965  . Endobronchial ultrasound N/A 02/26/2015    Procedure: ENDOBRONCHIAL ULTRASOUND;  Surgeon: Flora Lipps, MD;  Location: [facility] ORS;  Service: Cardiopulmonary;  Laterality: N/A;    FAMILY HISTORY Family History  Problem Relation Age of Onset  . Cancer Mother   . Cancer Sister   . Cancer Brother   . Cancer  Father     Lung Cancer       ADVANCED DIRECTIVES:    HEALTH MAINTENANCE: Social History  Substance Use Topics  . Smoking status: Former Smoker    Quit date: 02/21/2013  . Smokeless tobacco: Not on file  . Alcohol Use: No     Colonoscopy:  PAP:  Bone density:  Lipid panel:  Allergies  Allergen Reactions  . Plaquenil [Hydroxychloroquine]   . Simvastatin Other (See Comments)    Bad dreams    Current Outpatient Prescriptions  Medication Sig Dispense Refill  . albuterol (PROAIR HFA) 108 (90 BASE) MCG/ACT inhaler Inhale 2 puffs into the lungs every 4 (four) hours as needed for wheezing.     Marland Kitchen azithromycin (ZITHROMAX Z-PAK) 250 MG tablet 2 tabs po x day 1, then 1 tab po q day x 4 days 6 each 0  . B COMPLEX-C PO Take by mouth.    . Fluticasone-Salmeterol (ADVAIR DISKUS) 250-50 MCG/DOSE AEPB Inhale 1 puff into the lungs every morning.     . folic acid (FOLVITE) 1 MG tablet Take by mouth. Reported on 02/19/2015    . methotrexate 2.5 MG tablet Take 21.5 mg by mouth once a week.     Marland Kitchen omeprazole (PRILOSEC) 40 MG capsule Take by mouth.    . rosuvastatin (CRESTOR) 5 MG tablet Take 1 tablet (5 mg total) by mouth at bedtime. 90 tablet 0  . sildenafil (VIAGRA) 50 MG tablet Take by mouth. Reported on 02/19/2015    . tiotropium (SPIRIVA HANDIHALER) 18 MCG inhalation capsule Place into inhaler and  inhale 2 (two) times daily.     . traZODone (DESYREL) 50 MG tablet Take 1 tablet (50 mg total) by mouth at bedtime. 90 tablet 1   No current facility-administered medications for this visit.    OBJECTIVE: Filed Vitals:   06/10/15 0909  BP: 128/76  Pulse: 73  Temp: 95.9 F (35.5 C)  Resp: 18     Body mass index is 20.62 kg/(m^2).    ECOG FS:1 - Symptomatic but completely ambulatory  General: Well-developed, well-nourished, no acute distress. Eyes: Pink conjunctiva, anicteric sclera. Lungs: Clear to auscultation bilaterally. Heart: Regular rate and rhythm. No rubs, murmurs, or  gallops. Abdomen: Soft, nontender, nondistended. No organomegaly noted, normoactive bowel sounds. Musculoskeletal: No edema, cyanosis, or clubbing. Neuro: Alert, answering all questions appropriately. Cranial nerves grossly intact. Skin: No rashes or petechiae noted. Psych: Normal affect.   LAB RESULTS:  Lab Results  Component Value Date   NA 137 06/05/2015   K 4.3 06/05/2015   CL 107 06/05/2015   CO2 27 06/05/2015   GLUCOSE 83 06/05/2015   BUN 24* 06/05/2015   CREATININE 1.28* 06/05/2015   CALCIUM 8.9 06/05/2015   PROT 8.6* 12/08/2014   ALBUMIN 3.7 12/08/2014   AST 18 12/08/2014   ALT 9 12/08/2014   ALKPHOS 112 12/08/2014   BILITOT 0.3 12/08/2014   GFRNONAA 52* 06/05/2015   GFRAA >60 06/05/2015    Lab Results  Component Value Date   WBC 5.2 06/05/2015   NEUTROABS 3.6 12/30/2014   HGB 12.5* 06/05/2015   HCT 37.5* 06/05/2015   MCV 92.3 06/05/2015   PLT 275 06/05/2015     STUDIES: Ct Chest W Contrast  06/05/2015  CLINICAL DATA:  Former smoker with history of COPD and intermittent shortness of breath. Follow up right upper lobe pulmonary nodule. History of prostate cancer. EXAM: CT CHEST WITH CONTRAST TECHNIQUE: Multidetector CT imaging of the chest was performed during intravenous contrast administration. CONTRAST:  49m ISOVUE-300 IOPAMIDOL (ISOVUE-300) INJECTION 61% COMPARISON:  PET CT 02/12/2015. Chest CTs 01/14/2015 and 03/24/2014. FINDINGS: Mediastinum/Nodes: Small mediastinal and hilar lymph nodes are unchanged in size. Some of these were hypermetabolic on prior PET-CT. There is a small calcified right hilar node. No new or enlarging lymph nodes are identified. The thyroid gland, trachea and esophagus demonstrate no significant findings. The heart size is normal. There is no pericardial effusion. Coronary artery atherosclerosis and irregular mural thrombus within the thoracic aorta are grossly stable. Lungs/Pleura: There is no pleural effusion. There has been further  enlargement of the spiculated nodule posteriorly in the right upper lobe, measuring 16 x 8 mm on image 34. This appears mass-like and enlarging on the reformatted images as well, and was hypermetabolic on PET-CT. The other irregular linear right upper lobe density anteriorly is unchanged (image 57). Likewise, the small nodular density in the right middle lobe is stable and appears partially calcified (image 112). No new or other enlarging pulmonary nodules are identified. Severe emphysematous changes are present bilaterally. Upper abdomen: The visualized upper abdomen appears stable without suspicious findings. Musculoskeletal/Chest wall: There is no chest wall mass or suspicious osseous finding. IMPRESSION: 1. Further enlargement of spiculated nodule posteriorly in the right upper lobe, hypermetabolic on PET-CT and remaining concerning for bronchogenic carcinoma. 2. No other significant changes or suspicious pulmonary nodules. Areas of probable nodular scarring anteriorly in the right upper lobe and in the right middle lobe are stable. 3. Severe emphysema. 4. Moderate atherosclerosis. Electronically Signed   By: WCaryl ComesD.  On: 06/05/2015 10:11    ASSESSMENT: Lung mass, highly suspicious for malignancy. MGUS.   PLAN:    1. Lung mass: CT results reviewed independently and reported as above with right upper lobe lesion increasing from 1.2 cm to 1.6 cm. This is still highly concerning for malignancy. Previously, patient had ipsilateral and contralateral PET positive lymphadenopathy, concerning for N3 disease. Lymph node biopsy in January was negative for malignancy. Will further discuss patient's case at conference on Thursday, June 11, 2015 to determine whether to proceed with another biopsy or continue to monitor with CT scan in 3 months. We will call patient with follow-up plans. 2.  Polyclonal gammopathy. Patient's IgG, IgA, and IgM levels are all elevated. He does not have an M spike on his  SPEP. The remainder of his laboratory work is either negative or within normal limits. Polyclonal gammopathy is typically a benign disorder and does not require further intervention.  Patient expressed understanding and was in agreement with this plan. He also understands that He can call clinic at any time with any questions, concerns, or complaints.    Lloyd Huger, MD   06/10/2015 9:50 AM

## 2015-06-10 NOTE — Progress Notes (Signed)
Here to discuss test results and does have occasional dyspnea on exertion.

## 2015-06-11 DIAGNOSIS — R911 Solitary pulmonary nodule: Secondary | ICD-10-CM | POA: Diagnosis not present

## 2015-06-11 DIAGNOSIS — R0609 Other forms of dyspnea: Secondary | ICD-10-CM | POA: Diagnosis not present

## 2015-06-11 DIAGNOSIS — J439 Emphysema, unspecified: Secondary | ICD-10-CM | POA: Diagnosis not present

## 2015-06-12 ENCOUNTER — Telehealth: Payer: Self-pay | Admitting: *Deleted

## 2015-06-12 ENCOUNTER — Other Ambulatory Visit: Payer: Self-pay | Admitting: *Deleted

## 2015-06-12 DIAGNOSIS — R918 Other nonspecific abnormal finding of lung field: Secondary | ICD-10-CM

## 2015-06-12 NOTE — Telephone Encounter (Signed)
Spoke with patient to give recommendation from conference to get PFT's and see Dr. Genevive Bi next week. Message sent to schedulers and patient knows to expect a call with those appointments.

## 2015-06-16 ENCOUNTER — Ambulatory Visit: Payer: Commercial Managed Care - HMO | Attending: Oncology

## 2015-06-16 DIAGNOSIS — R918 Other nonspecific abnormal finding of lung field: Secondary | ICD-10-CM | POA: Insufficient documentation

## 2015-06-16 MED ORDER — ALBUTEROL SULFATE (2.5 MG/3ML) 0.083% IN NEBU
2.5000 mg | INHALATION_SOLUTION | Freq: Once | RESPIRATORY_TRACT | Status: AC
  Administered 2015-06-16: 2.5 mg via RESPIRATORY_TRACT
  Filled 2015-06-16: qty 3

## 2015-06-17 ENCOUNTER — Ambulatory Visit: Payer: Commercial Managed Care - HMO | Admitting: Oncology

## 2015-06-18 ENCOUNTER — Inpatient Hospital Stay (HOSPITAL_BASED_OUTPATIENT_CLINIC_OR_DEPARTMENT_OTHER): Payer: Commercial Managed Care - HMO | Admitting: Cardiothoracic Surgery

## 2015-06-18 ENCOUNTER — Other Ambulatory Visit: Payer: Self-pay | Admitting: Family Medicine

## 2015-06-18 VITALS — BP 145/85 | HR 79 | Temp 97.0°F | Ht 68.0 in | Wt 136.1 lb

## 2015-06-18 DIAGNOSIS — D890 Polyclonal hypergammaglobulinemia: Secondary | ICD-10-CM | POA: Diagnosis not present

## 2015-06-18 DIAGNOSIS — R590 Localized enlarged lymph nodes: Secondary | ICD-10-CM | POA: Diagnosis not present

## 2015-06-18 DIAGNOSIS — R918 Other nonspecific abnormal finding of lung field: Secondary | ICD-10-CM | POA: Diagnosis not present

## 2015-06-18 DIAGNOSIS — M069 Rheumatoid arthritis, unspecified: Secondary | ICD-10-CM | POA: Diagnosis not present

## 2015-06-18 DIAGNOSIS — K219 Gastro-esophageal reflux disease without esophagitis: Secondary | ICD-10-CM | POA: Diagnosis not present

## 2015-06-18 DIAGNOSIS — I2510 Atherosclerotic heart disease of native coronary artery without angina pectoris: Secondary | ICD-10-CM | POA: Diagnosis not present

## 2015-06-18 DIAGNOSIS — Z9981 Dependence on supplemental oxygen: Secondary | ICD-10-CM | POA: Diagnosis not present

## 2015-06-18 DIAGNOSIS — R0609 Other forms of dyspnea: Secondary | ICD-10-CM | POA: Diagnosis not present

## 2015-06-18 DIAGNOSIS — J449 Chronic obstructive pulmonary disease, unspecified: Secondary | ICD-10-CM | POA: Diagnosis not present

## 2015-06-18 NOTE — Progress Notes (Signed)
Patient ID: Collin Gonzales, male   DOB: 1935-07-25, 80 y.o.   MRN: [id number]  Chief Complaint  Patient presents with  . Lung Mass    Referred By Dr. [name] Reason for Referral right upper lobe mass  HPI Location, Quality, Duration, Severity, Timing, Context, Modifying Factors, Associated Signs and Symptoms.  Collin Gonzales is a 80 y.o. male.  He's been followed for a right upper lobe mass. Back in late 2015 he had a CT scan which revealed a 7-8 mm right upper lobe nodule. This has been progressing in size and currently measures about 12 mm. He had a PET scan performed back in December which revealed uptake in the nodule but also extensive mediastinal uptake. He underwent a bronchoscopy and biopsy by Dr. Louie Bun which did not reveal any evidence of metastatic disease in the mediastinum. The patient has been on oxygen therapy which he states he uses throughout the day. He does not use it at night as it comes off in bed. Throughout the day uses oxygen whenever he is walking in the house or whenever he goes outside. He does think it makes his breathing somewhat easier. Today his oxygen saturations were 93-94% on room air. He did walk into the clinic today but was short of breath and did request a handicap sticker. He did not walk up the steps as again he felt that that would be too much exercise for him. He has not had any hemoptysis or weight loss. He does have shortness of breath and wheezing. His most recent CT scan did show continued enlargement of the right upper lobe mass and I was asked to see the patient for consideration of surgical resection.   Past Medical History  Diagnosis Date  . GERD (gastroesophageal reflux disease)   . COPD (chronic obstructive pulmonary disease) ([place])   . Arthritis     rheumatoid arthritis  . Insomnia   . History of prostate cancer     Past Surgical History  Procedure Laterality Date  . Appendectomy  1965  . Hernia repair  1965  .  Endobronchial ultrasound N/A 02/26/2015    Procedure: ENDOBRONCHIAL ULTRASOUND;  Surgeon: Flora Lipps, MD;  Location: [facility] ORS;  Service: Cardiopulmonary;  Laterality: N/A;    Family History  Problem Relation Age of Onset  . Cancer Mother   . Cancer Sister   . Cancer Brother   . Cancer Father     Lung Cancer    Social History Social History  Substance Use Topics  . Smoking status: Former Smoker    Quit date: 02/21/2013  . Smokeless tobacco: Not on file  . Alcohol Use: No    Allergies  Allergen Reactions  . Plaquenil [Hydroxychloroquine]   . Simvastatin Other (See Comments)    Bad dreams    Current Outpatient Prescriptions  Medication Sig Dispense Refill  . albuterol (PROAIR HFA) 108 (90 BASE) MCG/ACT inhaler Inhale 2 puffs into the lungs every 4 (four) hours as needed for wheezing.     . B COMPLEX-C PO Take by mouth.    . Fluticasone-Salmeterol (ADVAIR DISKUS) 250-50 MCG/DOSE AEPB Inhale 1 puff into the lungs every morning.     . folic acid (FOLVITE) 1 MG tablet Take by mouth. Reported on 02/19/2015    . methotrexate 2.5 MG tablet Take 21.5 mg by mouth once a week.     Marland Kitchen omeprazole (PRILOSEC) 40 MG capsule Take by mouth.    . rosuvastatin (CRESTOR) 5 MG tablet Take 1  tablet (5 mg total) by mouth at bedtime. 90 tablet 0  . sildenafil (VIAGRA) 50 MG tablet Take by mouth. Reported on 02/19/2015    . tiotropium (SPIRIVA HANDIHALER) 18 MCG inhalation capsule Place into inhaler and inhale 2 (two) times daily.     . traZODone (DESYREL) 50 MG tablet Take 1 tablet (50 mg total) by mouth at bedtime. 90 tablet 1   No current facility-administered medications for this visit.      Review of Systems A complete review of systems was asked and was negative except for the following positive findingsLoss of appetite, shortness of breath, wheezing, frequent urination, excessive thirst.  Blood pressure 145/85, pulse 79, temperature 97 F (36.1 C), temperature source Tympanic, height 5'  8" (1.727 m), weight 136 lb 2.1 oz (61.75 kg).  Physical Exam CONSTITUTIONAL:  Pleasant, well-developed, well-nourished, and in no acute distress. EYES: Pupils equal and reactive to light, Sclera non-icteric EARS, NOSE, MOUTH AND THROAT:  The oropharynx was clear.  Dentition is good repair.  Oral mucosa pink and moist. LYMPH NODES:  Lymph nodes in the neck and axillae were normal RESPIRATORY:  Lungs were clear.  Normal respiratory effort without pathologic use of accessory muscles of respiration CARDIOVASCULAR: Heart was regular without murmurs.  There were no carotid bruits. GI: The abdomen was soft, nontender, and nondistended. There were no palpable masses. There was no hepatosplenomegaly. There were normal bowel sounds in all quadrants. GU:  Rectal deferred.   MUSCULOSKELETAL:  Normal muscle strength and tone.  No clubbing or cyanosis.   SKIN:  There were no pathologic skin lesions.  There were no nodules on palpation. NEUROLOGIC:  Sensation is normal.  Cranial nerves are grossly intact. PSYCH:  Oriented to person, place and time.  Mood and affect are normal.  Data Reviewed CT and PET  I have personally reviewed the patient's imaging, laboratory findings and medical records.    Assessment    I have independently reviewed the CT scan PET scan. There is an enlarging peripheral right upper lobe mass most consistent with a malignancy. His FEV1 and DLCO are both in the 50% range.    Plan    I believe he most likely has an upper lobe malignancy. I discussed with him the options. I would like to obtain a PET scan to rule out any other disease and compare to his most recent PET scan performed last year. If there are no other new findings and the patient will need to decide if he wishes to pursue surgical resection. I discussed with him the indications and risks of surgery including risks of bleeding, infection, air leak, ventilator dependence and death. We discussed the role of thoracoscopy  and/or thoracotomy. He will have a PET scan made and present back next week for further discussion.       Nestor Lewandowsky, MD 06/18/2015, 3:50 PM

## 2015-06-23 ENCOUNTER — Encounter
Admission: RE | Admit: 2015-06-23 | Discharge: 2015-06-23 | Disposition: A | Discharge status: Home / Self Care | Payer: Commercial Managed Care - HMO | Source: Ambulatory Visit | Attending: Cardiothoracic Surgery | Admitting: Cardiothoracic Surgery

## 2015-06-23 DIAGNOSIS — R911 Solitary pulmonary nodule: Secondary | ICD-10-CM | POA: Diagnosis not present

## 2015-06-23 DIAGNOSIS — R918 Other nonspecific abnormal finding of lung field: Secondary | ICD-10-CM | POA: Insufficient documentation

## 2015-06-23 LAB — GLUCOSE, CAPILLARY: GLUCOSE-CAPILLARY: 77 mg/dL (ref 65–99)

## 2015-06-23 MED ORDER — FLUDEOXYGLUCOSE F - 18 (FDG) INJECTION
12.0100 | Freq: Once | INTRAVENOUS | Status: AC | PRN
Start: 2015-06-23 — End: 2015-06-23
  Administered 2015-06-23: 12.01 via INTRAVENOUS

## 2015-06-25 ENCOUNTER — Inpatient Hospital Stay: Payer: Commercial Managed Care - HMO | Attending: Cardiothoracic Surgery | Admitting: Cardiothoracic Surgery

## 2015-06-25 ENCOUNTER — Ambulatory Visit: Payer: Commercial Managed Care - HMO | Attending: Cardiothoracic Surgery

## 2015-06-25 VITALS — BP 142/88 | HR 89 | Temp 97.0°F | Ht 68.0 in | Wt 133.6 lb

## 2015-06-25 DIAGNOSIS — R918 Other nonspecific abnormal finding of lung field: Secondary | ICD-10-CM

## 2015-06-25 LAB — BLOOD GAS, ARTERIAL
ACID-BASE EXCESS: 0.5 mmol/L (ref 0.0–3.0)
BICARBONATE: 25.4 meq/L (ref 21.0–28.0)
FIO2: 28
O2 Saturation: 91 %
PATIENT TEMPERATURE: 37
PH ART: 7.4 (ref 7.350–7.450)
PO2 ART: 61 mmHg — AB (ref 83.0–108.0)
pCO2 arterial: 41 mmHg (ref 32.0–48.0)

## 2015-06-25 NOTE — Progress Notes (Signed)
Patient here today for results no concerns or complaints today.

## 2015-06-25 NOTE — Progress Notes (Signed)
Collin Gonzales Inpatient Post-Op Note  Patient ID: Collin Gonzales, male   DOB: December 13, 1935, 80 y.o.   MRN: [id number]  HISTORY: This patient returns today in follow-up. He continues to remain on his oxygen and states that he does use it with very rare exceptions. He had some pulmonary function studies done as well as a PET scan done. I have independently reviewed those and discuss those results with he and his daughter today.   Filed Vitals:   06/25/15 1140  BP: 142/88  Pulse: 89  Temp: 97 F (36.1 C)     EXAM: Resp: Lungs are clear bilaterally.  No respiratory distress, normal effort. Heart:  Regular without murmurs Abd:  Abdomen is soft, non distended and non tender. No masses are palpable.  There is no rebound and no guarding.  Neurological: Alert and oriented to person, place, and time. Coordination normal.  Skin: Skin is warm and dry. No rash noted. No diaphoretic. No erythema. No pallor.  Psychiatric: Normal mood and affect. Normal behavior. Judgment and thought content normal.    ASSESSMENT: I have independently reviewed the PET scan. There is intense uptake in the right upper lobe nodule most consistent with a malignancy. The previously noted mediastinal nodes are smaller and less PET avid. I believe that he most likely has a right upper lobe cancer without evidence of distant disease.   PLAN:   I had a long discussion with him today regarding the options. His FEV1 and DLCO are both 50% of predicted. I reviewed with him the options of thoracoscopy or thoracotomy with wedge resection. I explained to him the indications and risks of surgery. Given his age and comorbid conditions he has declined any surgical intervention. I also discussed with him the role of needle biopsy followed by radiation therapy and he would like to pursue that. We'll go ahead and set him up for a consultation with Dr. Debara Pickett also obtain an arterial blood gas today for completion of his pulmonary  function tests.    Collin Lewandowsky, MD

## 2015-06-26 ENCOUNTER — Telehealth: Payer: Self-pay | Admitting: *Deleted

## 2015-06-26 NOTE — Telephone Encounter (Signed)
Met with patient at thoracic surgery appt yesterday, discussed potential biopsy with both pulmonary and interventional radiology. To see radiation oncologist to discuss possibility of doing radiation without prior biopsy due to risk of biopsy.

## 2015-06-26 NOTE — Telephone Encounter (Signed)
Ok thanks.  Can you make sure he has f/u with me after all that is complete?

## 2015-07-03 ENCOUNTER — Ambulatory Visit
Admission: RE | Admit: 2015-07-03 | Discharge: 2015-07-03 | Disposition: A | Discharge status: Home / Self Care | Payer: Commercial Managed Care - HMO | Source: Ambulatory Visit | Attending: Radiation Oncology | Admitting: Radiation Oncology

## 2015-07-03 ENCOUNTER — Encounter: Payer: Self-pay | Admitting: Radiation Oncology

## 2015-07-03 VITALS — BP 130/82 | HR 81 | Temp 95.2°F | Resp 20 | Wt 135.4 lb

## 2015-07-03 DIAGNOSIS — C3411 Malignant neoplasm of upper lobe, right bronchus or lung: Secondary | ICD-10-CM | POA: Diagnosis not present

## 2015-07-03 DIAGNOSIS — R918 Other nonspecific abnormal finding of lung field: Secondary | ICD-10-CM

## 2015-07-03 DIAGNOSIS — R0602 Shortness of breath: Secondary | ICD-10-CM | POA: Insufficient documentation

## 2015-07-03 DIAGNOSIS — Z8546 Personal history of malignant neoplasm of prostate: Secondary | ICD-10-CM | POA: Insufficient documentation

## 2015-07-03 DIAGNOSIS — G4700 Insomnia, unspecified: Secondary | ICD-10-CM | POA: Insufficient documentation

## 2015-07-03 DIAGNOSIS — Z87891 Personal history of nicotine dependence: Secondary | ICD-10-CM | POA: Insufficient documentation

## 2015-07-03 DIAGNOSIS — J449 Chronic obstructive pulmonary disease, unspecified: Secondary | ICD-10-CM | POA: Insufficient documentation

## 2015-07-03 DIAGNOSIS — Z510 Encounter for antineoplastic radiation therapy: Secondary | ICD-10-CM | POA: Insufficient documentation

## 2015-07-03 DIAGNOSIS — K219 Gastro-esophageal reflux disease without esophagitis: Secondary | ICD-10-CM | POA: Insufficient documentation

## 2015-07-03 DIAGNOSIS — M129 Arthropathy, unspecified: Secondary | ICD-10-CM | POA: Insufficient documentation

## 2015-07-03 DIAGNOSIS — Z809 Family history of malignant neoplasm, unspecified: Secondary | ICD-10-CM | POA: Insufficient documentation

## 2015-07-03 NOTE — Consult Note (Signed)
Except an outstanding is perfect of Radiation Oncology NEW PATIENT EVALUATION  Name: Collin Gonzales  MRN: [id number]  Date:   07/03/2015     DOB: [birth year]   This 80 y.o. male patient presents to the clinic for initial evaluation of stage I probable non-small cell lung cancer of right upper lobe.  REFERRING PHYSICIAN: Roselee Nova, MD  CHIEF COMPLAINT:  Chief Complaint  Patient presents with  . Lung Cancer    Pt is here for initial consultation of a lung mass    DIAGNOSIS: The encounter diagnosis was Lung mass.   PREVIOUS INVESTIGATIONS:  PET CT and serial CT scans are reviewed Clinical notes reviewed  HPI: Patient is a 80 year old male originally presented back in 2015 with a 71m mass in the right upper lobe has been followed with progressing increased in size. He had a PET CT scan back in December 2016 showing hypermetabolic 1.2 cm spiculated nodule in the right upper lobe. Also is notable hypermetabolic ipsilateral mediastinal contralateral nodes. Underwent bronchoscopy with biopsy those knows which were negative. Most recently had a PET CT scan performed again showing interval enlargement of the spiculated right upper lobe with decrease in the hypermetabolic activity of the mediastinal nodes. Patient is oxygen dependent. His FEV1 and DLCO are both 50% of predicted. He's been declined for surgical resection. We have presented at our T his case at our weekly tumor conference and unanimous opinion was this is a non-small cell lung cancer progressing and that even needle biopsy would put the patient at significant risk of pneumothorax and significant medical consequences. I been asked to evaluate the patient for possible SB RT. He specifically denies cough hemoptysis or chest tightness.  PLANNED TREATMENT REGIMEN: SB RT to right upper lobe  PAST MEDICAL HISTORY:  has a past medical history of GERD (gastroesophageal reflux disease); COPD (chronic obstructive pulmonary disease)  ([place]; Arthritis; Insomnia; and History of prostate cancer.    PAST SURGICAL HISTORY:  Past Surgical History  Procedure Laterality Date  . Appendectomy  1965  . Hernia repair  1965  . Endobronchial ultrasound N/A 02/26/2015    Procedure: ENDOBRONCHIAL ULTRASOUND;  Surgeon: KFlora Lipps MD;  Location: [facility] ORS;  Service: Cardiopulmonary;  Laterality: N/A;    FAMILY HISTORY: family history includes Cancer in his brother, father, mother, and sister.  SOCIAL HISTORY:  reports that he quit smoking about 2 years ago. He does not have any smokeless tobacco history on file. He reports that he does not drink alcohol or use illicit drugs.  ALLERGIES: Plaquenil and Simvastatin  MEDICATIONS:  Current Outpatient Prescriptions  Medication Sig Dispense Refill  . albuterol (PROAIR HFA) 108 (90 BASE) MCG/ACT inhaler Inhale 2 puffs into the lungs every 4 (four) hours as needed for wheezing.     . B COMPLEX-C PO Take by mouth.    . Fluticasone-Salmeterol (ADVAIR DISKUS) 250-50 MCG/DOSE AEPB Inhale 1 puff into the lungs every morning.     . folic acid (FOLVITE) 1 MG tablet Take by mouth. Reported on 02/19/2015    . methotrexate 2.5 MG tablet Take 21.5 mg by mouth once a week.     .Marland Kitchenomeprazole (PRILOSEC) 40 MG capsule Take by mouth.    . rosuvastatin (CRESTOR) 5 MG tablet TAKE 1 TABLET (5 MG TOTAL) BY MOUTH AT BEDTIME. 90 tablet 0  . sildenafil (VIAGRA) 50 MG tablet Take by mouth. Reported on 02/19/2015    . tiotropium (SPIRIVA HANDIHALER) 18 MCG inhalation capsule Place into inhaler and  inhale 2 (two) times daily.     . traZODone (DESYREL) 50 MG tablet Take 1 tablet (50 mg total) by mouth at bedtime. 90 tablet 1   No current facility-administered medications for this encounter.    ECOG PERFORMANCE STATUS:  0 - Asymptomatic  REVIEW OF SYSTEMS: Except for the significant shortness of breath and dyspnea on exertion and oxygen dependency Patient denies any weight loss, fatigue, weakness, fever, chills or  night sweats. Patient denies any loss of vision, blurred vision. Patient denies any ringing  of the ears or hearing loss. No irregular heartbeat. Patient denies heart murmur or history of fainting. Patient denies any chest pain or pain radiating to her upper extremities. Patient denies any shortness of breath, difficulty breathing at night, cough or hemoptysis. Patient denies any swelling in the lower legs. Patient denies any nausea vomiting, vomiting of blood, or coffee ground material in the vomitus. Patient denies any stomach pain. Patient states has had normal bowel movements no significant constipation or diarrhea. Patient denies any dysuria, hematuria or significant nocturia. Patient denies any problems walking, swelling in the joints or loss of balance. Patient denies any skin changes, loss of hair or loss of weight. Patient denies any excessive worrying or anxiety or significant depression. Patient denies any problems with insomnia. Patient denies excessive thirst, polyuria, polydipsia. Patient denies any swollen glands, patient denies easy bruising or easy bleeding. Patient denies any recent infections, allergies or URI. Patient "s visual fields have not changed significantly in recent time.   PHYSICAL EXAM: BP 130/82 mmHg  Pulse 81  Temp(Src) 95.2 F (35.1 C)  Resp 20  Wt 135 lb 5.8 oz (61.4 kg) Frail well-developed male in NAD wheelchair-bound on nasal oxygen. No cervical or supraclavicular adenopathy is appreciated. Well-developed well-nourished patient in NAD. HEENT reveals PERLA, EOMI, discs not visualized.  Oral cavity is clear. No oral mucosal lesions are identified. Neck is clear without evidence of cervical or supraclavicular adenopathy. Lungs are clear to A&P. Cardiac examination is essentially unremarkable with regular rate and rhythm without murmur rub or thrill. Abdomen is benign with no organomegaly or masses noted. Motor sensory and DTR levels are equal and symmetric in the upper  and lower extremities. Cranial nerves II through XII are grossly intact. Proprioception is intact. No peripheral adenopathy or edema is identified. No motor or sensory levels are noted. Crude visual fields are within normal range.      RADIOLOGY RESULTS: PET CT and serial CT scans are reviewed compatible with the above-stated findings   IMPRESSION: Progressive right upper lobe non-small cell lung cancer by PET and CT criteria  PLAN: At this time I would recommend SB RT treatment 5000 cGy in 5 fractions to his right upper lobe. Risks and benefits of treatment including possible development of cough fatigue slight skin reaction possible slight radiation esophagitis all were discussed in detail with the patient. He seems to comprehend my treatment plan well. I've personally set up and ordered CT simulation for next week. Again based on his pulmonary function tests the overall evidence of increasing size and hypermetabolic activity on PET CT scan the anonymous opinion of her tumor board to proceed with treatment at this time I based my recommendations for SB RT.  I would like to take this opportunity for allowing me to participate in the care of your patient.Armstead Peaks., MD

## 2015-07-03 NOTE — Progress Notes (Signed)
Verbal and written education given to patient.  Pt verbalized understanding.  All questions answered to his satisfaction.  Approximately 10 minutes spent on education.

## 2015-07-07 DIAGNOSIS — J449 Chronic obstructive pulmonary disease, unspecified: Secondary | ICD-10-CM | POA: Diagnosis not present

## 2015-07-08 ENCOUNTER — Encounter: Payer: Self-pay | Admitting: Family Medicine

## 2015-07-08 ENCOUNTER — Ambulatory Visit (INDEPENDENT_AMBULATORY_CARE_PROVIDER_SITE_OTHER): Payer: Commercial Managed Care - HMO | Admitting: Family Medicine

## 2015-07-08 VITALS — BP 120/70 | HR 85 | Temp 98.0°F | Resp 16 | Ht 68.0 in | Wt 132.1 lb

## 2015-07-08 DIAGNOSIS — E785 Hyperlipidemia, unspecified: Secondary | ICD-10-CM

## 2015-07-08 NOTE — Progress Notes (Signed)
Name: Collin Gonzales   MRN: [id number]    DOB: 1936-02-13   Date:07/08/2015       Progress Note  Subjective  Chief Complaint  Chief Complaint  Patient presents with  . 4 month follow up  . Hyperlipidemia    Hyperlipidemia This is a chronic problem. The problem is controlled. Recent lipid tests were reviewed and are normal. Pertinent negatives include no chest pain, myalgias or shortness of breath. Current antihyperlipidemic treatment includes statins.    Past Medical History  Diagnosis Date  . GERD (gastroesophageal reflux disease)   . COPD (chronic obstructive pulmonary disease) ([place])   . Arthritis     rheumatoid arthritis  . Insomnia   . History of prostate cancer     Past Surgical History  Procedure Laterality Date  . Appendectomy  1965  . Hernia repair  1965  . Endobronchial ultrasound N/A 02/26/2015    Procedure: ENDOBRONCHIAL ULTRASOUND;  Surgeon: Flora Lipps, MD;  Location: [facility] ORS;  Service: Cardiopulmonary;  Laterality: N/A;    Family History  Problem Relation Age of Onset  . Cancer Mother   . Cancer Sister   . Cancer Brother   . Cancer Father     Lung Cancer    Social History   Social History  . Marital Status: Single    Spouse Name: N/A  . Number of Children: N/A  . Years of Education: N/A   Occupational History  . Not on file.   Social History Main Topics  . Smoking status: Former Smoker    Quit date: 02/21/2013  . Smokeless tobacco: Not on file  . Alcohol Use: No  . Drug Use: No  . Sexual Activity: Not on file   Other Topics Concern  . Not on file   Social History Narrative     Current outpatient prescriptions:  .  albuterol (PROAIR HFA) 108 (90 BASE) MCG/ACT inhaler, Inhale 2 puffs into the lungs every 4 (four) hours as needed for wheezing. , Disp: , Rfl:  .  B COMPLEX-C PO, Take by mouth., Disp: , Rfl:  .  Fluticasone-Salmeterol (ADVAIR DISKUS) 250-50 MCG/DOSE AEPB, Inhale 1 puff into the lungs every morning. , Disp: , Rfl:  .   folic acid (FOLVITE) 1 MG tablet, Take by mouth. Reported on 02/19/2015, Disp: , Rfl:  .  methotrexate 2.5 MG tablet, Take 21.5 mg by mouth once a week. , Disp: , Rfl:  .  omeprazole (PRILOSEC) 40 MG capsule, Take by mouth., Disp: , Rfl:  .  rosuvastatin (CRESTOR) 5 MG tablet, TAKE 1 TABLET (5 MG TOTAL) BY MOUTH AT BEDTIME., Disp: 90 tablet, Rfl: 0 .  sildenafil (VIAGRA) 50 MG tablet, Take by mouth. Reported on 02/19/2015, Disp: , Rfl:  .  tiotropium (SPIRIVA HANDIHALER) 18 MCG inhalation capsule, Place into inhaler and inhale 2 (two) times daily. , Disp: , Rfl:  .  traZODone (DESYREL) 50 MG tablet, Take 1 tablet (50 mg total) by mouth at bedtime., Disp: 90 tablet, Rfl: 1  Allergies  Allergen Reactions  . Plaquenil [Hydroxychloroquine]   . Simvastatin Other (See Comments)    Bad dreams     Review of Systems  Respiratory: Negative for shortness of breath.   Cardiovascular: Negative for chest pain.  Musculoskeletal: Negative for myalgias.     Objective  Filed Vitals:   07/08/15 0820  BP: 120/70  Pulse: 85  Temp: 98 F (36.7 C)  TempSrc: Oral  Resp: 16  Height: '5\' 8"'$  (1.727 m)  Weight:  132 lb 1.6 oz (59.92 kg)  SpO2: 94%    Physical Exam  Constitutional: He is oriented to person, place, and time and well-developed, well-nourished, and in no distress.  Cardiovascular: Normal rate and regular rhythm.   Pulmonary/Chest: Effort normal and breath sounds normal.  Neurological: He is alert and oriented to person, place, and time.  Nursing note and vitals reviewed.      Assessment & Plan  1. HLD (hyperlipidemia) FLP at goal, repeat today. - Lipid Profile   Mattelyn Imhoff Asad A. [facility] 07/08/2015 8:34 AM

## 2015-07-09 ENCOUNTER — Ambulatory Visit
Admission: RE | Admit: 2015-07-09 | Discharge: 2015-07-09 | Disposition: A | Discharge status: Home / Self Care | Payer: Commercial Managed Care - HMO | Source: Ambulatory Visit | Attending: Radiation Oncology | Admitting: Radiation Oncology

## 2015-07-09 DIAGNOSIS — Z510 Encounter for antineoplastic radiation therapy: Secondary | ICD-10-CM | POA: Diagnosis not present

## 2015-07-09 DIAGNOSIS — Z809 Family history of malignant neoplasm, unspecified: Secondary | ICD-10-CM | POA: Diagnosis not present

## 2015-07-09 DIAGNOSIS — Z87891 Personal history of nicotine dependence: Secondary | ICD-10-CM | POA: Diagnosis not present

## 2015-07-09 DIAGNOSIS — G4700 Insomnia, unspecified: Secondary | ICD-10-CM | POA: Diagnosis not present

## 2015-07-09 DIAGNOSIS — J449 Chronic obstructive pulmonary disease, unspecified: Secondary | ICD-10-CM | POA: Diagnosis not present

## 2015-07-09 DIAGNOSIS — C3411 Malignant neoplasm of upper lobe, right bronchus or lung: Secondary | ICD-10-CM | POA: Diagnosis not present

## 2015-07-09 DIAGNOSIS — K219 Gastro-esophageal reflux disease without esophagitis: Secondary | ICD-10-CM | POA: Diagnosis not present

## 2015-07-09 DIAGNOSIS — R0602 Shortness of breath: Secondary | ICD-10-CM | POA: Diagnosis not present

## 2015-07-09 DIAGNOSIS — M129 Arthropathy, unspecified: Secondary | ICD-10-CM | POA: Diagnosis not present

## 2015-07-09 DIAGNOSIS — Z8546 Personal history of malignant neoplasm of prostate: Secondary | ICD-10-CM | POA: Diagnosis not present

## 2015-07-09 LAB — LIPID PANEL
Chol/HDL Ratio: 3.5 ratio units (ref 0.0–5.0)
Cholesterol, Total: 152 mg/dL (ref 100–199)
HDL: 43 mg/dL (ref >39)
LDL Calculated: 95 mg/dL (ref 0–99)
TRIGLYCERIDES: 70 mg/dL (ref 0–149)
VLDL CHOLESTEROL CAL: 14 mg/dL (ref 5–40)

## 2015-07-14 DIAGNOSIS — R0602 Shortness of breath: Secondary | ICD-10-CM | POA: Diagnosis not present

## 2015-07-14 DIAGNOSIS — Z510 Encounter for antineoplastic radiation therapy: Secondary | ICD-10-CM | POA: Diagnosis not present

## 2015-07-14 DIAGNOSIS — M129 Arthropathy, unspecified: Secondary | ICD-10-CM | POA: Diagnosis not present

## 2015-07-14 DIAGNOSIS — K219 Gastro-esophageal reflux disease without esophagitis: Secondary | ICD-10-CM | POA: Diagnosis not present

## 2015-07-14 DIAGNOSIS — Z87891 Personal history of nicotine dependence: Secondary | ICD-10-CM | POA: Diagnosis not present

## 2015-07-14 DIAGNOSIS — R918 Other nonspecific abnormal finding of lung field: Secondary | ICD-10-CM | POA: Diagnosis not present

## 2015-07-14 DIAGNOSIS — Z8546 Personal history of malignant neoplasm of prostate: Secondary | ICD-10-CM | POA: Diagnosis not present

## 2015-07-14 DIAGNOSIS — G4700 Insomnia, unspecified: Secondary | ICD-10-CM | POA: Diagnosis not present

## 2015-07-14 DIAGNOSIS — J449 Chronic obstructive pulmonary disease, unspecified: Secondary | ICD-10-CM | POA: Diagnosis not present

## 2015-07-14 DIAGNOSIS — C3411 Malignant neoplasm of upper lobe, right bronchus or lung: Secondary | ICD-10-CM | POA: Diagnosis not present

## 2015-07-15 DIAGNOSIS — Z87891 Personal history of nicotine dependence: Secondary | ICD-10-CM | POA: Diagnosis not present

## 2015-07-15 DIAGNOSIS — C3411 Malignant neoplasm of upper lobe, right bronchus or lung: Secondary | ICD-10-CM | POA: Diagnosis not present

## 2015-07-15 DIAGNOSIS — Z8546 Personal history of malignant neoplasm of prostate: Secondary | ICD-10-CM | POA: Diagnosis not present

## 2015-07-15 DIAGNOSIS — K219 Gastro-esophageal reflux disease without esophagitis: Secondary | ICD-10-CM | POA: Diagnosis not present

## 2015-07-15 DIAGNOSIS — R0602 Shortness of breath: Secondary | ICD-10-CM | POA: Diagnosis not present

## 2015-07-15 DIAGNOSIS — J449 Chronic obstructive pulmonary disease, unspecified: Secondary | ICD-10-CM | POA: Diagnosis not present

## 2015-07-15 DIAGNOSIS — R918 Other nonspecific abnormal finding of lung field: Secondary | ICD-10-CM | POA: Diagnosis not present

## 2015-07-15 DIAGNOSIS — M129 Arthropathy, unspecified: Secondary | ICD-10-CM | POA: Diagnosis not present

## 2015-07-15 DIAGNOSIS — G4700 Insomnia, unspecified: Secondary | ICD-10-CM | POA: Diagnosis not present

## 2015-07-15 DIAGNOSIS — Z510 Encounter for antineoplastic radiation therapy: Secondary | ICD-10-CM | POA: Diagnosis not present

## 2015-07-16 DIAGNOSIS — Z8546 Personal history of malignant neoplasm of prostate: Secondary | ICD-10-CM | POA: Diagnosis not present

## 2015-07-16 DIAGNOSIS — K219 Gastro-esophageal reflux disease without esophagitis: Secondary | ICD-10-CM | POA: Diagnosis not present

## 2015-07-16 DIAGNOSIS — J449 Chronic obstructive pulmonary disease, unspecified: Secondary | ICD-10-CM | POA: Diagnosis not present

## 2015-07-16 DIAGNOSIS — Z87891 Personal history of nicotine dependence: Secondary | ICD-10-CM | POA: Diagnosis not present

## 2015-07-16 DIAGNOSIS — C3411 Malignant neoplasm of upper lobe, right bronchus or lung: Secondary | ICD-10-CM | POA: Diagnosis not present

## 2015-07-16 DIAGNOSIS — R0602 Shortness of breath: Secondary | ICD-10-CM | POA: Diagnosis not present

## 2015-07-16 DIAGNOSIS — M129 Arthropathy, unspecified: Secondary | ICD-10-CM | POA: Diagnosis not present

## 2015-07-16 DIAGNOSIS — Z510 Encounter for antineoplastic radiation therapy: Secondary | ICD-10-CM | POA: Diagnosis not present

## 2015-07-16 DIAGNOSIS — G4700 Insomnia, unspecified: Secondary | ICD-10-CM | POA: Diagnosis not present

## 2015-07-16 DIAGNOSIS — R918 Other nonspecific abnormal finding of lung field: Secondary | ICD-10-CM | POA: Diagnosis not present

## 2015-07-21 ENCOUNTER — Ambulatory Visit
Admission: RE | Admit: 2015-07-21 | Discharge: 2015-07-21 | Disposition: A | Discharge status: Home / Self Care | Payer: Commercial Managed Care - HMO | Source: Ambulatory Visit | Attending: Radiation Oncology | Admitting: Radiation Oncology

## 2015-07-21 ENCOUNTER — Ambulatory Visit
Admission: RE | Admit: 2015-07-21 | Payer: Commercial Managed Care - HMO | Source: Ambulatory Visit | Admitting: Radiation Oncology

## 2015-07-22 ENCOUNTER — Ambulatory Visit
Admission: RE | Admit: 2015-07-22 | Discharge: 2015-07-22 | Disposition: A | Discharge status: Home / Self Care | Payer: Commercial Managed Care - HMO | Source: Ambulatory Visit | Attending: Radiation Oncology | Admitting: Radiation Oncology

## 2015-07-22 DIAGNOSIS — G4700 Insomnia, unspecified: Secondary | ICD-10-CM | POA: Diagnosis not present

## 2015-07-22 DIAGNOSIS — Z8546 Personal history of malignant neoplasm of prostate: Secondary | ICD-10-CM | POA: Diagnosis not present

## 2015-07-22 DIAGNOSIS — K219 Gastro-esophageal reflux disease without esophagitis: Secondary | ICD-10-CM | POA: Diagnosis not present

## 2015-07-22 DIAGNOSIS — M129 Arthropathy, unspecified: Secondary | ICD-10-CM | POA: Diagnosis not present

## 2015-07-22 DIAGNOSIS — Z510 Encounter for antineoplastic radiation therapy: Secondary | ICD-10-CM | POA: Diagnosis not present

## 2015-07-22 DIAGNOSIS — C3411 Malignant neoplasm of upper lobe, right bronchus or lung: Secondary | ICD-10-CM | POA: Diagnosis not present

## 2015-07-22 DIAGNOSIS — R0602 Shortness of breath: Secondary | ICD-10-CM | POA: Diagnosis not present

## 2015-07-22 DIAGNOSIS — J449 Chronic obstructive pulmonary disease, unspecified: Secondary | ICD-10-CM | POA: Diagnosis not present

## 2015-07-22 DIAGNOSIS — Z87891 Personal history of nicotine dependence: Secondary | ICD-10-CM | POA: Diagnosis not present

## 2015-07-24 DIAGNOSIS — R0602 Shortness of breath: Secondary | ICD-10-CM | POA: Diagnosis not present

## 2015-07-24 DIAGNOSIS — Z510 Encounter for antineoplastic radiation therapy: Secondary | ICD-10-CM | POA: Diagnosis not present

## 2015-07-24 DIAGNOSIS — J449 Chronic obstructive pulmonary disease, unspecified: Secondary | ICD-10-CM | POA: Diagnosis not present

## 2015-07-24 DIAGNOSIS — Z8546 Personal history of malignant neoplasm of prostate: Secondary | ICD-10-CM | POA: Diagnosis not present

## 2015-07-24 DIAGNOSIS — Z87891 Personal history of nicotine dependence: Secondary | ICD-10-CM | POA: Diagnosis not present

## 2015-07-24 DIAGNOSIS — K219 Gastro-esophageal reflux disease without esophagitis: Secondary | ICD-10-CM | POA: Diagnosis not present

## 2015-07-24 DIAGNOSIS — M129 Arthropathy, unspecified: Secondary | ICD-10-CM | POA: Diagnosis not present

## 2015-07-24 DIAGNOSIS — C3411 Malignant neoplasm of upper lobe, right bronchus or lung: Secondary | ICD-10-CM | POA: Diagnosis not present

## 2015-07-24 DIAGNOSIS — G4700 Insomnia, unspecified: Secondary | ICD-10-CM | POA: Diagnosis not present

## 2015-07-28 ENCOUNTER — Ambulatory Visit
Admission: RE | Admit: 2015-07-28 | Discharge: 2015-07-28 | Disposition: A | Discharge status: Home / Self Care | Payer: Commercial Managed Care - HMO | Source: Ambulatory Visit | Attending: Radiation Oncology | Admitting: Radiation Oncology

## 2015-07-28 DIAGNOSIS — Z510 Encounter for antineoplastic radiation therapy: Secondary | ICD-10-CM | POA: Diagnosis not present

## 2015-07-28 DIAGNOSIS — M129 Arthropathy, unspecified: Secondary | ICD-10-CM | POA: Diagnosis not present

## 2015-07-28 DIAGNOSIS — R0602 Shortness of breath: Secondary | ICD-10-CM | POA: Diagnosis not present

## 2015-07-28 DIAGNOSIS — J449 Chronic obstructive pulmonary disease, unspecified: Secondary | ICD-10-CM | POA: Diagnosis not present

## 2015-07-28 DIAGNOSIS — G4700 Insomnia, unspecified: Secondary | ICD-10-CM | POA: Diagnosis not present

## 2015-07-28 DIAGNOSIS — C3411 Malignant neoplasm of upper lobe, right bronchus or lung: Secondary | ICD-10-CM | POA: Diagnosis not present

## 2015-07-28 DIAGNOSIS — Z8546 Personal history of malignant neoplasm of prostate: Secondary | ICD-10-CM | POA: Diagnosis not present

## 2015-07-28 DIAGNOSIS — K219 Gastro-esophageal reflux disease without esophagitis: Secondary | ICD-10-CM | POA: Diagnosis not present

## 2015-07-28 DIAGNOSIS — Z87891 Personal history of nicotine dependence: Secondary | ICD-10-CM | POA: Diagnosis not present

## 2015-07-30 ENCOUNTER — Ambulatory Visit
Admission: RE | Admit: 2015-07-30 | Discharge: 2015-07-30 | Disposition: A | Discharge status: Home / Self Care | Payer: Commercial Managed Care - HMO | Source: Ambulatory Visit | Attending: Radiation Oncology | Admitting: Radiation Oncology

## 2015-07-30 DIAGNOSIS — Z8546 Personal history of malignant neoplasm of prostate: Secondary | ICD-10-CM | POA: Diagnosis not present

## 2015-07-30 DIAGNOSIS — J449 Chronic obstructive pulmonary disease, unspecified: Secondary | ICD-10-CM | POA: Diagnosis not present

## 2015-07-30 DIAGNOSIS — Z510 Encounter for antineoplastic radiation therapy: Secondary | ICD-10-CM | POA: Diagnosis not present

## 2015-07-30 DIAGNOSIS — M129 Arthropathy, unspecified: Secondary | ICD-10-CM | POA: Diagnosis not present

## 2015-07-30 DIAGNOSIS — G4700 Insomnia, unspecified: Secondary | ICD-10-CM | POA: Diagnosis not present

## 2015-07-30 DIAGNOSIS — R0602 Shortness of breath: Secondary | ICD-10-CM | POA: Diagnosis not present

## 2015-07-30 DIAGNOSIS — K219 Gastro-esophageal reflux disease without esophagitis: Secondary | ICD-10-CM | POA: Diagnosis not present

## 2015-07-30 DIAGNOSIS — Z87891 Personal history of nicotine dependence: Secondary | ICD-10-CM | POA: Diagnosis not present

## 2015-07-30 DIAGNOSIS — C3411 Malignant neoplasm of upper lobe, right bronchus or lung: Secondary | ICD-10-CM | POA: Diagnosis not present

## 2015-07-31 ENCOUNTER — Other Ambulatory Visit: Payer: Self-pay | Admitting: Family Medicine

## 2015-08-04 ENCOUNTER — Other Ambulatory Visit: Payer: Self-pay | Admitting: Family Medicine

## 2015-08-04 ENCOUNTER — Ambulatory Visit
Admission: RE | Admit: 2015-08-04 | Discharge: 2015-08-04 | Disposition: A | Discharge status: Home / Self Care | Payer: Commercial Managed Care - HMO | Source: Ambulatory Visit | Attending: Radiation Oncology | Admitting: Radiation Oncology

## 2015-08-04 DIAGNOSIS — K219 Gastro-esophageal reflux disease without esophagitis: Secondary | ICD-10-CM | POA: Diagnosis not present

## 2015-08-04 DIAGNOSIS — R0602 Shortness of breath: Secondary | ICD-10-CM | POA: Diagnosis not present

## 2015-08-04 DIAGNOSIS — C3411 Malignant neoplasm of upper lobe, right bronchus or lung: Secondary | ICD-10-CM | POA: Diagnosis not present

## 2015-08-04 DIAGNOSIS — Z8546 Personal history of malignant neoplasm of prostate: Secondary | ICD-10-CM | POA: Diagnosis not present

## 2015-08-04 DIAGNOSIS — M129 Arthropathy, unspecified: Secondary | ICD-10-CM | POA: Diagnosis not present

## 2015-08-04 DIAGNOSIS — J449 Chronic obstructive pulmonary disease, unspecified: Secondary | ICD-10-CM | POA: Diagnosis not present

## 2015-08-04 DIAGNOSIS — Z87891 Personal history of nicotine dependence: Secondary | ICD-10-CM | POA: Diagnosis not present

## 2015-08-04 DIAGNOSIS — G4700 Insomnia, unspecified: Secondary | ICD-10-CM | POA: Diagnosis not present

## 2015-08-04 DIAGNOSIS — Z510 Encounter for antineoplastic radiation therapy: Secondary | ICD-10-CM | POA: Diagnosis not present

## 2015-08-04 DIAGNOSIS — F5104 Psychophysiologic insomnia: Secondary | ICD-10-CM

## 2015-08-04 NOTE — Telephone Encounter (Signed)
PT SAID THAT HE IS STILL WAITING FOR HIS REFILLS ON HIS TRAZDONE. SAYS THAT THE PHARM HAS SENT OVER SOMETHING PER THE PATIENT SAYING THAT A PRIOR AUTH MAY BE NEEDED. HE IS OUT

## 2015-08-04 NOTE — Telephone Encounter (Signed)
Routed to Dr. Manuella Ghazi for medication refill approval

## 2015-08-04 NOTE — Telephone Encounter (Signed)
Patient was last evaluated for insomnia in September 2016 per our office notes. He will need an new appointment for medication refills. If pharmacy is requesting prior authorization, please complete the prior authorization paperwork and forward it to me to review and sign.

## 2015-08-04 NOTE — Telephone Encounter (Signed)
PT SAID THAT HE WAS HERE IN MAY AND THOUGHT THAT THE DR REFILLED THE MEDICATION THEN BUT PHARM SAYS THEY DID NOT RECEIVE ANYTHING. SAYS THAT HE NEEDS THIS FOR A 90 DAY SUPPLY.

## 2015-08-05 ENCOUNTER — Ambulatory Visit (INDEPENDENT_AMBULATORY_CARE_PROVIDER_SITE_OTHER): Payer: Commercial Managed Care - HMO | Admitting: Family Medicine

## 2015-08-05 ENCOUNTER — Encounter: Payer: Self-pay | Admitting: Family Medicine

## 2015-08-05 VITALS — BP 117/81 | HR 80 | Temp 97.7°F | Resp 16 | Ht 68.0 in | Wt 134.7 lb

## 2015-08-05 DIAGNOSIS — F5104 Psychophysiologic insomnia: Secondary | ICD-10-CM

## 2015-08-05 DIAGNOSIS — K219 Gastro-esophageal reflux disease without esophagitis: Secondary | ICD-10-CM | POA: Diagnosis not present

## 2015-08-05 DIAGNOSIS — G4700 Insomnia, unspecified: Secondary | ICD-10-CM | POA: Diagnosis not present

## 2015-08-05 MED ORDER — OMEPRAZOLE 40 MG PO CPDR: 40.0000 mg | DELAYED_RELEASE_CAPSULE | Freq: Every day | ORAL | Status: DC

## 2015-08-05 MED ORDER — TRAZODONE HCL 50 MG PO TABS: 50.0000 mg | ORAL_TABLET | Freq: Every day | ORAL | Status: DC

## 2015-08-05 NOTE — Progress Notes (Signed)
Name: Collin Gonzales   MRN: [id number]    DOB: [birth year]   Date:08/05/2015       Progress Note  Subjective  Chief Complaint  Chief Complaint  Patient presents with  . Medication Refill    trazodone 50 mg / omeprazole     Insomnia Primary symptoms: no sleep disturbance, no difficulty falling asleep.  The problem occurs nightly. Typical bedtime:  11-12 P.M..  How long after going to bed to you fall asleep: 15-30 minutes.    Gastroesophageal Reflux He reports no abdominal pain, no belching, no dysphagia or no heartburn. This is a chronic problem. The problem has been unchanged. He has tried a PPI for the symptoms. Past procedures do not include an EGD.     Past Medical History  Diagnosis Date  . GERD (gastroesophageal reflux disease)   . COPD (chronic obstructive pulmonary disease) ([place])   . Arthritis     rheumatoid arthritis  . Insomnia   . History of prostate cancer     Past Surgical History  Procedure Laterality Date  . Appendectomy  1965  . Hernia repair  1965  . Endobronchial ultrasound N/A 02/26/2015    Procedure: ENDOBRONCHIAL ULTRASOUND;  Surgeon: Flora Lipps, MD;  Location: [facility] ORS;  Service: Cardiopulmonary;  Laterality: N/A;    Family History  Problem Relation Age of Onset  . Cancer Mother   . Cancer Sister   . Cancer Brother   . Cancer Father     Lung Cancer    Social History   Social History  . Marital Status: Single    Spouse Name: N/A  . Number of Children: N/A  . Years of Education: N/A   Occupational History  . Not on file.   Social History Main Topics  . Smoking status: Former Smoker    Quit date: 02/21/2013  . Smokeless tobacco: Not on file  . Alcohol Use: No  . Drug Use: No  . Sexual Activity: Not on file   Other Topics Concern  . Not on file   Social History Narrative     Current outpatient prescriptions:  .  albuterol (PROAIR HFA) 108 (90 BASE) MCG/ACT inhaler, Inhale 2 puffs into the lungs every 4 (four) hours as needed  for wheezing. , Disp: , Rfl:  .  albuterol (PROAIR HFA) 108 (90 Base) MCG/ACT inhaler, Inhale into the lungs., Disp: , Rfl:  .  B COMPLEX-C PO, Take by mouth., Disp: , Rfl:  .  Fluticasone-Salmeterol (ADVAIR DISKUS) 250-50 MCG/DOSE AEPB, Inhale 1 puff into the lungs every morning. , Disp: , Rfl:  .  folic acid (FOLVITE) 1 MG tablet, Take by mouth. Reported on 02/19/2015, Disp: , Rfl:  .  methotrexate 2.5 MG tablet, Take 21.5 mg by mouth once a week. , Disp: , Rfl:  .  omeprazole (PRILOSEC) 40 MG capsule, Take by mouth., Disp: , Rfl:  .  rosuvastatin (CRESTOR) 5 MG tablet, TAKE 1 TABLET (5 MG TOTAL) BY MOUTH AT BEDTIME., Disp: 90 tablet, Rfl: 0 .  sildenafil (VIAGRA) 50 MG tablet, Take by mouth. Reported on 02/19/2015, Disp: , Rfl:  .  tiotropium (SPIRIVA HANDIHALER) 18 MCG inhalation capsule, Place into inhaler and inhale 2 (two) times daily. , Disp: , Rfl:  .  traZODone (DESYREL) 50 MG tablet, Take 1 tablet (50 mg total) by mouth at bedtime., Disp: 90 tablet, Rfl: 1  Allergies  Allergen Reactions  . Plaquenil [Hydroxychloroquine]   . Simvastatin Other (See Comments)    Bad dreams  Review of Systems  Gastrointestinal: Negative for heartburn, dysphagia and abdominal pain.  Psychiatric/Behavioral: Negative for sleep disturbance. The patient has insomnia.      Objective  Filed Vitals:   08/05/15 1347  BP: 117/81  Pulse: 80  Temp: 97.7 F (36.5 C)  TempSrc: Oral  Resp: 16  Height: '5\' 8"'$  (1.727 m)  Weight: 134 lb 11.2 oz (61.1 kg)  SpO2: 91%    Physical Exam  Constitutional: He is oriented to person, place, and time and well-developed, well-nourished, and in no distress.  HENT:  Head: Normocephalic and atraumatic.  Cardiovascular: Normal rate, regular rhythm and normal heart sounds.   No murmur heard. Pulmonary/Chest: Effort normal and breath sounds normal. He has no wheezes.  Abdominal: Soft. Bowel sounds are normal. There is no tenderness.  Neurological: He is alert  and oriented to person, place, and time.  Psychiatric: Mood, memory, affect and judgment normal.  Nursing note and vitals reviewed.      Assessment & Plan  1. Chronic insomnia Stable on trazodone taken at bedtime. - traZODone (DESYREL) 50 MG tablet; Take 1 tablet (50 mg total) by mouth at bedtime.  Dispense: 90 tablet; Refill: 1  2. Gastroesophageal reflux disease, esophagitis presence not specified  - omeprazole (PRILOSEC) 40 MG capsule; Take 1 capsule (40 mg total) by mouth daily.  Dispense: 90 capsule; Refill: 1   Collin Gonzales Asad A. [facility] 08/05/2015 2:54 PM

## 2015-08-05 NOTE — Telephone Encounter (Addendum)
Spoke with patient and he has an appointment schedule for 08/05/2015 @ 2:40pm for medication refill

## 2015-08-06 DIAGNOSIS — M0579 Rheumatoid arthritis with rheumatoid factor of multiple sites without organ or systems involvement: Secondary | ICD-10-CM | POA: Diagnosis not present

## 2015-08-06 DIAGNOSIS — Z79899 Other long term (current) drug therapy: Secondary | ICD-10-CM | POA: Diagnosis not present

## 2015-08-07 DIAGNOSIS — J449 Chronic obstructive pulmonary disease, unspecified: Secondary | ICD-10-CM | POA: Diagnosis not present

## 2015-08-18 ENCOUNTER — Emergency Department: Payer: Commercial Managed Care - HMO

## 2015-08-18 ENCOUNTER — Inpatient Hospital Stay
Admission: EM | Admit: 2015-08-18 | Discharge: 2015-08-20 | DRG: 392 | Disposition: A | Discharge status: Home / Self Care | Payer: Commercial Managed Care - HMO | Attending: Internal Medicine | Admitting: Internal Medicine

## 2015-08-18 DIAGNOSIS — K529 Noninfective gastroenteritis and colitis, unspecified: Secondary | ICD-10-CM | POA: Diagnosis not present

## 2015-08-18 DIAGNOSIS — Z85118 Personal history of other malignant neoplasm of bronchus and lung: Secondary | ICD-10-CM | POA: Diagnosis not present

## 2015-08-18 DIAGNOSIS — Z8546 Personal history of malignant neoplasm of prostate: Secondary | ICD-10-CM

## 2015-08-18 DIAGNOSIS — K210 Gastro-esophageal reflux disease with esophagitis: Secondary | ICD-10-CM | POA: Diagnosis not present

## 2015-08-18 DIAGNOSIS — G4700 Insomnia, unspecified: Secondary | ICD-10-CM | POA: Diagnosis present

## 2015-08-18 DIAGNOSIS — Z9049 Acquired absence of other specified parts of digestive tract: Secondary | ICD-10-CM

## 2015-08-18 DIAGNOSIS — Z79899 Other long term (current) drug therapy: Secondary | ICD-10-CM

## 2015-08-18 DIAGNOSIS — R1032 Left lower quadrant pain: Secondary | ICD-10-CM | POA: Diagnosis not present

## 2015-08-18 DIAGNOSIS — J449 Chronic obstructive pulmonary disease, unspecified: Secondary | ICD-10-CM | POA: Diagnosis not present

## 2015-08-18 DIAGNOSIS — D72829 Elevated white blood cell count, unspecified: Secondary | ICD-10-CM | POA: Diagnosis not present

## 2015-08-18 DIAGNOSIS — Z87891 Personal history of nicotine dependence: Secondary | ICD-10-CM | POA: Diagnosis not present

## 2015-08-18 DIAGNOSIS — C3490 Malignant neoplasm of unspecified part of unspecified bronchus or lung: Secondary | ICD-10-CM | POA: Diagnosis present

## 2015-08-18 DIAGNOSIS — Z801 Family history of malignant neoplasm of trachea, bronchus and lung: Secondary | ICD-10-CM

## 2015-08-18 DIAGNOSIS — Z888 Allergy status to other drugs, medicaments and biological substances status: Secondary | ICD-10-CM | POA: Diagnosis not present

## 2015-08-18 DIAGNOSIS — Z9889 Other specified postprocedural states: Secondary | ICD-10-CM | POA: Diagnosis not present

## 2015-08-18 DIAGNOSIS — Z66 Do not resuscitate: Secondary | ICD-10-CM | POA: Diagnosis present

## 2015-08-18 DIAGNOSIS — M069 Rheumatoid arthritis, unspecified: Secondary | ICD-10-CM | POA: Diagnosis present

## 2015-08-18 DIAGNOSIS — Z7982 Long term (current) use of aspirin: Secondary | ICD-10-CM | POA: Diagnosis not present

## 2015-08-18 DIAGNOSIS — K219 Gastro-esophageal reflux disease without esophagitis: Secondary | ICD-10-CM | POA: Diagnosis not present

## 2015-08-18 HISTORY — DX: Systemic involvement of connective tissue, unspecified: M35.9

## 2015-08-18 LAB — TYPE AND SCREEN
ABO/RH(D): O POS
ANTIBODY SCREEN: NEGATIVE

## 2015-08-18 LAB — PROTIME-INR
INR: 1.27
Prothrombin Time: 16 seconds — ABNORMAL HIGH (ref 11.4–15.0)

## 2015-08-18 LAB — LIPASE, BLOOD: Lipase: 20 U/L (ref 11–51)

## 2015-08-18 LAB — COMPREHENSIVE METABOLIC PANEL
ALK PHOS: 97 U/L (ref 38–126)
ALT: 11 U/L — AB (ref 17–63)
ANION GAP: 7 (ref 5–15)
AST: 22 U/L (ref 15–41)
Albumin: 3.4 g/dL — ABNORMAL LOW (ref 3.5–5.0)
BILIRUBIN TOTAL: 0.6 mg/dL (ref 0.3–1.2)
BUN: 24 mg/dL — ABNORMAL HIGH (ref 6–20)
CALCIUM: 9.4 mg/dL (ref 8.9–10.3)
CO2: 26 mmol/L (ref 22–32)
Chloride: 104 mmol/L (ref 101–111)
Creatinine, Ser: 1.24 mg/dL (ref 0.61–1.24)
GFR calc non Af Amer: 54 mL/min — ABNORMAL LOW (ref >60)
Glucose, Bld: 107 mg/dL — ABNORMAL HIGH (ref 65–99)
Potassium: 4.6 mmol/L (ref 3.5–5.1)
Sodium: 137 mmol/L (ref 135–145)
TOTAL PROTEIN: 8.8 g/dL — AB (ref 6.5–8.1)

## 2015-08-18 LAB — CBC
HEMATOCRIT: 39.2 % — AB (ref 40.0–52.0)
HEMOGLOBIN: 12.9 g/dL — AB (ref 13.0–18.0)
MCH: 30.6 pg (ref 26.0–34.0)
MCHC: 32.9 g/dL (ref 32.0–36.0)
MCV: 93 fL (ref 80.0–100.0)
Platelets: 216 10*3/uL (ref 150–440)
RBC: 4.21 MIL/uL — AB (ref 4.40–5.90)
RDW: 16.6 % — ABNORMAL HIGH (ref 11.5–14.5)
WBC: 16 10*3/uL — ABNORMAL HIGH (ref 3.8–10.6)

## 2015-08-18 LAB — APTT: aPTT: 33 seconds (ref 24–36)

## 2015-08-18 MED ORDER — TIOTROPIUM BROMIDE MONOHYDRATE 18 MCG IN CAPS
18.0000 ug | ORAL_CAPSULE | Freq: Every day | RESPIRATORY_TRACT | Status: DC
  Administered 2015-08-18 – 2015-08-20 (×3): 18 ug via RESPIRATORY_TRACT
  Filled 2015-08-18: qty 5

## 2015-08-18 MED ORDER — ONDANSETRON HCL 4 MG/2ML IJ SOLN
4.0000 mg | Freq: Once | INTRAMUSCULAR | Status: AC
  Administered 2015-08-18: 4 mg via INTRAVENOUS
  Filled 2015-08-18: qty 2

## 2015-08-18 MED ORDER — B COMPLEX-C PO TABS
1.0000 | ORAL_TABLET | Freq: Every day | ORAL | Status: DC
  Administered 2015-08-18 – 2015-08-19 (×2): 1 via ORAL
  Filled 2015-08-18 (×3): qty 1

## 2015-08-18 MED ORDER — SODIUM CHLORIDE 0.9 % IV SOLN
1000.0000 mL | Freq: Once | INTRAVENOUS | Status: AC
  Administered 2015-08-18: 1000 mL via INTRAVENOUS

## 2015-08-18 MED ORDER — ACETAMINOPHEN 325 MG PO TABS: 650.0000 mg | ORAL_TABLET | Freq: Four times a day (QID) | ORAL | Status: DC | PRN

## 2015-08-18 MED ORDER — TRAZODONE HCL 50 MG PO TABS
50.0000 mg | ORAL_TABLET | Freq: Every day | ORAL | Status: DC
  Administered 2015-08-18 – 2015-08-19 (×2): 50 mg via ORAL
  Filled 2015-08-18 (×2): qty 1

## 2015-08-18 MED ORDER — PHENOL 1.4 % MT LIQD
1.0000 | OROMUCOSAL | Status: DC | PRN
  Filled 2015-08-18: qty 177

## 2015-08-18 MED ORDER — ALBUTEROL SULFATE (2.5 MG/3ML) 0.083% IN NEBU: 2.5000 mg | INHALATION_SOLUTION | RESPIRATORY_TRACT | Status: DC | PRN

## 2015-08-18 MED ORDER — PANTOPRAZOLE SODIUM 40 MG PO TBEC
40.0000 mg | DELAYED_RELEASE_TABLET | Freq: Every day | ORAL | Status: DC
  Administered 2015-08-18 – 2015-08-20 (×3): 40 mg via ORAL
  Filled 2015-08-18 (×3): qty 1

## 2015-08-18 MED ORDER — CIPROFLOXACIN IN D5W 400 MG/200ML IV SOLN
400.0000 mg | Freq: Once | INTRAVENOUS | Status: AC
  Administered 2015-08-18: 400 mg via INTRAVENOUS
  Filled 2015-08-18: qty 200

## 2015-08-18 MED ORDER — ACETAMINOPHEN 650 MG RE SUPP: 650.0000 mg | Freq: Four times a day (QID) | RECTAL | Status: DC | PRN

## 2015-08-18 MED ORDER — CIPROFLOXACIN IN D5W 400 MG/200ML IV SOLN
400.0000 mg | Freq: Two times a day (BID) | INTRAVENOUS | Status: DC
  Administered 2015-08-18 – 2015-08-19 (×3): 400 mg via INTRAVENOUS
  Filled 2015-08-18 (×5): qty 200

## 2015-08-18 MED ORDER — MORPHINE SULFATE (PF) 4 MG/ML IV SOLN
4.0000 mg | Freq: Once | INTRAVENOUS | Status: AC
  Administered 2015-08-18: 4 mg via INTRAVENOUS
  Filled 2015-08-18: qty 1

## 2015-08-18 MED ORDER — DIATRIZOATE MEGLUMINE & SODIUM 66-10 % PO SOLN
15.0000 mL | Freq: Once | ORAL | Status: AC
  Administered 2015-08-18: 15 mL via ORAL

## 2015-08-18 MED ORDER — OXYCODONE HCL 5 MG PO TABS
5.0000 mg | ORAL_TABLET | ORAL | Status: DC | PRN
  Administered 2015-08-18 – 2015-08-20 (×3): 5 mg via ORAL
  Filled 2015-08-18 (×2): qty 1

## 2015-08-18 MED ORDER — MOMETASONE FURO-FORMOTEROL FUM 200-5 MCG/ACT IN AERO
2.0000 | INHALATION_SPRAY | Freq: Two times a day (BID) | RESPIRATORY_TRACT | Status: DC
  Administered 2015-08-18 – 2015-08-20 (×5): 2 via RESPIRATORY_TRACT
  Filled 2015-08-18: qty 8.8

## 2015-08-18 MED ORDER — METRONIDAZOLE IN NACL 5-0.79 MG/ML-% IV SOLN
500.0000 mg | Freq: Three times a day (TID) | INTRAVENOUS | Status: DC
  Administered 2015-08-18 – 2015-08-20 (×5): 500 mg via INTRAVENOUS
  Filled 2015-08-18 (×7): qty 100

## 2015-08-18 MED ORDER — IOPAMIDOL (ISOVUE-300) INJECTION 61%
100.0000 mL | Freq: Once | INTRAVENOUS | Status: AC | PRN
  Administered 2015-08-18: 100 mL via INTRAVENOUS

## 2015-08-18 MED ORDER — OXYCODONE HCL 5 MG PO TABS
ORAL_TABLET | ORAL | Status: AC
  Administered 2015-08-18: 5 mg via ORAL
  Filled 2015-08-18: qty 1

## 2015-08-18 MED ORDER — MORPHINE SULFATE (PF) 2 MG/ML IV SOLN: 1.0000 mg | INTRAVENOUS | Status: DC | PRN

## 2015-08-18 MED ORDER — METRONIDAZOLE IN NACL 5-0.79 MG/ML-% IV SOLN
500.0000 mg | Freq: Once | INTRAVENOUS | Status: AC
  Administered 2015-08-18: 500 mg via INTRAVENOUS
  Filled 2015-08-18: qty 100

## 2015-08-18 NOTE — ED Provider Notes (Signed)
[facility] Emergency Department Provider Note  ____________________________________________    I have reviewed the triage vital signs and the nursing notes.   HISTORY  Chief Complaint Abdominal Pain    HPI Collin Gonzales is a 80 y.o. male who presents with lower abdominal pain x 1 day. He reports the pain started yesterday, is moderate to severe and worse L>R. No fevers or nausea or vomiting. He notes a bloody bowel movement yesterday evening and today. No history of similar pain or gi bleeding. No recent nsaid use. Received radiation for lung nodule recently     Past Medical History  Diagnosis Date  . GERD (gastroesophageal reflux disease)   . COPD (chronic obstructive pulmonary disease) ([place])   . Arthritis     rheumatoid arthritis  . Insomnia   . History of prostate cancer   . Collagen vascular disease ([place])   . Cancer ([place])   . Lung cancer [facility])     Patient Active Problem List   Diagnosis Date Noted  . Colitis 08/18/2015  . GERD (gastroesophageal reflux disease) 08/05/2015  . Adenopathy   . Candidate for statin therapy due to risk of future cardiovascular event 12/08/2014  . Elevated serum globulin level 12/08/2014  . Annual physical exam 11/21/2014  . CAFL (chronic airflow limitation) ([place]) 08/20/2014  . HLD (hyperlipidemia) 08/20/2014  . Acid reflux 08/20/2014  . Cerebrovascular accident, old 08/20/2014  . Personal history of malignant neoplasm of prostate 08/20/2014  . Chronic insomnia 08/20/2014  . Lung mass 08/20/2014  . Arthritis or polyarthritis, rheumatoid ([place]) 08/20/2014    Past Surgical History  Procedure Laterality Date  . Appendectomy  1965  . Hernia repair  1965  . Endobronchial ultrasound N/A 02/26/2015    Procedure: ENDOBRONCHIAL ULTRASOUND;  Surgeon: Flora Lipps, MD;  Location: [facility] ORS;  Service: Cardiopulmonary;  Laterality: N/A;    Current Outpatient Rx  Name  Route  Sig  Dispense  Refill  . albuterol  (PROAIR HFA) 108 (90 BASE) MCG/ACT inhaler   Inhalation   Inhale 2 puffs into the lungs every 4 (four) hours as needed for wheezing.          Marland Kitchen aspirin EC 81 MG tablet   Oral   Take 81 mg by mouth daily.         . B COMPLEX-C PO   Oral   Take 1 tablet by mouth daily.          . Fluticasone-Salmeterol (ADVAIR DISKUS) 250-50 MCG/DOSE AEPB   Inhalation   Inhale 1 puff into the lungs every morning.          . methotrexate 2.5 MG tablet   Oral   Take 21.5 mg by mouth once a week.          Marland Kitchen omeprazole (PRILOSEC) 40 MG capsule   Oral   Take 1 capsule (40 mg total) by mouth daily.   90 capsule   1   . tiotropium (SPIRIVA HANDIHALER) 18 MCG inhalation capsule   Inhalation   Place into inhaler and inhale 2 (two) times daily.          . traZODone (DESYREL) 50 MG tablet   Oral   Take 1 tablet (50 mg total) by mouth at bedtime.   90 tablet   1     Allergies Plaquenil and Simvastatin  Family History  Problem Relation Age of Onset  . Cancer Mother   . Cancer Sister   . Cancer Brother   .  Cancer Father     Lung Cancer    Social History Social History  Substance Use Topics  . Smoking status: Former Smoker    Quit date: 02/21/2013  . Smokeless tobacco: None  . Alcohol Use: No    Review of Systems  Constitutional: Negative for fever. Eyes: Negative for redness ENT: Negative for sore throat Cardiovascular: Negative for chest pain Respiratory: Negative for shortness of breath. Gastrointestinal:As above Genitourinary: Negative for dysuria. Musculoskeletal: Negative for back pain. Skin: Negative for rash. Neurological: Negative for focal weakness Psychiatric: no anxiety    ____________________________________________   PHYSICAL EXAM:  VITAL SIGNS: ED Triage Vitals  Enc Vitals Group     BP 08/18/15 0915 125/68 mmHg     Pulse Rate 08/18/15 0915 76     Resp 08/18/15 0915 18     Temp 08/18/15 0915 97.7 F (36.5 C)     Temp Source 08/18/15 0915  Oral     SpO2 08/18/15 0915 93 %     Weight 08/18/15 0915 135 lb (61.236 kg)     Height 08/18/15 0915 '5\' 8"'$  (1.727 m)     Head Cir --      Peak Flow --      Pain Score --      Pain Loc --      Pain Edu? --      Excl. in [place]? --     Constitutional: Alert and oriented. Well appearing and in no distress.  Eyes: Conjunctivae are normal. No erythema or injection ENT   Head: Normocephalic and atraumatic.   Mouth/Throat: Mucous membranes are moist. Cardiovascular: Normal rate, regular rhythm. Normal and symmetric distal pulses are present in the upper extremities. No murmurs or rubs  Respiratory: Normal respiratory effort without tachypnea nor retractions. Breath sounds are clear and equal bilaterally.  Gastrointestinal: Tender to palpation left lower quadrant. No distention. There is no CVA tenderness. Genitourinary: deferred Musculoskeletal: Nontender with normal range of motion in all extremities. No lower extremity tenderness nor edema. Neurologic:  Normal speech and language. No gross focal neurologic deficits are appreciated. Skin:  Skin is warm, dry and intact. No rash noted. Psychiatric: Mood and affect are normal. Patient exhibits appropriate insight and judgment.  ____________________________________________    LABS (pertinent positives/negatives)  Labs Reviewed  PROTIME-INR - Abnormal; Notable for the following:    Prothrombin Time 16.0 (*)    All other components within normal limits  CBC - Abnormal; Notable for the following:    WBC 16.0 (*)    RBC 4.21 (*)    Hemoglobin 12.9 (*)    HCT 39.2 (*)    RDW 16.6 (*)    All other components within normal limits  COMPREHENSIVE METABOLIC PANEL - Abnormal; Notable for the following:    Glucose, Bld 107 (*)    BUN 24 (*)    Total Protein 8.8 (*)    Albumin 3.4 (*)    ALT 11 (*)    GFR calc non Af Amer 54 (*)    All other components within normal limits  C DIFFICILE QUICK SCREEN W PCR REFLEX  GASTROINTESTINAL  PANEL BY PCR, STOOL (REPLACES STOOL CULTURE)  APTT  LIPASE, BLOOD  TYPE AND SCREEN    ____________________________________________   EKG  None  ____________________________________________    RADIOLOGY  CT abd/pelvis c/w colitis  ____________________________________________   PROCEDURES  Procedure(s) performed: none  Critical Care performed: none  ____________________________________________   INITIAL IMPRESSION / ASSESSMENT AND PLAN / ED COURSE  Pertinent  labs & imaging results that were available during my care of the patient were reviewed by me and considered in my medical decision making (see chart for details).  Patient presents with left lower quadrant tenderness to palpation rectal bleeding, we will give IV fluids, morphine, Zofran and CT head and pelvis and reevaluate.  CT abd/pelvis demonstrates colitis. We will admit to the hospitalist service for further management  ____________________________________________   FINAL CLINICAL IMPRESSION(S) / ED DIAGNOSES  Final diagnoses:  Colitis          Lavonia Drafts, MD 08/18/15 1317

## 2015-08-18 NOTE — ED Notes (Signed)
Pt c/o lower abd pain since yesterday.. Denies vomiting or diarrhea.Collin Gonzales

## 2015-08-18 NOTE — Progress Notes (Signed)
Pt came to floor at 1430. VSS. Pt was Oriented to room and safety plan. Pt was placed on enteric precautions and education was done.

## 2015-08-18 NOTE — ED Notes (Signed)
Pt back from CT

## 2015-08-18 NOTE — H&P (Signed)
[place] [facility] [place] NAME: Collin Gonzales    MR#:  [id number]  DATE OF BIRTH:  Feb 09, 1936  DATE OF ADMISSION:  08/18/2015  PRIMARY CARE PHYSICIAN: Collin Rake, MD   REQUESTING/REFERRING PHYSICIAN: Cephas Gonzales  CHIEF COMPLAINT:   Chief Complaint  Patient presents with  . Abdominal Pain    HISTORY OF PRESENT ILLNESS:  Collin Gonzales  is a 80 y.o. male presents with lower abdominal pain and some diarrhea with some blood. He quantifies the blood as about a teaspoonful and he saw that a few times. He's been having a few bowel movements a day but yesterday had a large episode of bowel movement. He describes the pain as in the lower abdomen constant 4 out of 10 intensity. Nothing made it better or worse yet. He states he had a colonoscopy 11 years ago that was negative. He did have antibiotics about 2 months ago before his radiation treatment for his lung cancer.  PAST MEDICAL HISTORY:   Past Medical History  Diagnosis Date  . GERD (gastroesophageal reflux disease)   . COPD (chronic obstructive pulmonary disease) ([place])   . Arthritis     rheumatoid arthritis  . Insomnia   . History of prostate cancer   . Collagen vascular disease ([place])   . Cancer ([place])   . Lung cancer ([place])     PAST SURGICAL HISTORY:   Past Surgical History  Procedure Laterality Date  . Appendectomy  1965  . Hernia repair  1965  . Endobronchial ultrasound N/A 02/26/2015    Procedure: ENDOBRONCHIAL ULTRASOUND;  Surgeon: Collin Lipps, MD;  Location: [facility] ORS;  Service: Cardiopulmonary;  Laterality: N/A;    SOCIAL HISTORY:   Social History  Substance Use Topics  . Smoking status: Former Smoker    Quit date: 02/21/2013  . Smokeless tobacco: Not on file  . Alcohol Use: No    FAMILY HISTORY:   Family History  Problem Relation Age of Onset  . Cancer Mother   . Cancer Sister   . Cancer Brother   . Cancer Father     Lung Cancer    DRUG  ALLERGIES:   Allergies  Allergen Reactions  . Plaquenil [Hydroxychloroquine]   . Simvastatin Other (See Comments)    Bad dreams    REVIEW OF SYSTEMS:  CONSTITUTIONAL: No fever, fatigue or weakness.  EYES: No blurred or double vision. Wears glasses EARS, NOSE, AND THROAT: No tinnitus or ear pain. No sore throat RESPIRATORY: No cough. Positive for shortness of breath. No wheezing or hemoptysis.  CARDIOVASCULAR: No chest pain, orthopnea, edema.  GASTROINTESTINAL: No nausea, vomiting. Positive for diarrhea and lower abdominal pain. Positive for blood in bowel movements teaspoonful. GENITOURINARY: No dysuria, hematuria.  ENDOCRINE: No polyuria, nocturia,  HEMATOLOGY: No anemia, easy bruising or bleeding SKIN: No rash or lesion. MUSCULOSKELETAL: No joint pain or arthritis.   NEUROLOGIC: No tingling, numbness, weakness.  PSYCHIATRY: No anxiety or depression.   MEDICATIONS AT HOME:   Prior to Admission medications   Medication Sig Start Date End Date Taking? Authorizing Provider  albuterol (PROAIR HFA) 108 (90 BASE) MCG/ACT inhaler Inhale 2 puffs into the lungs every 4 (four) hours as needed for wheezing.  05/20/14  Yes Historical Provider, MD  aspirin EC 81 MG tablet Take 81 mg by mouth daily.   Yes Historical Provider, MD  B COMPLEX-C PO Take 1 tablet by mouth daily.    Yes Historical Provider, MD  Fluticasone-Salmeterol (ADVAIR DISKUS) 250-50 MCG/DOSE  AEPB Inhale 1 puff into the lungs every morning.  05/20/14  Yes Historical Provider, MD  methotrexate 2.5 MG tablet Take 21.5 mg by mouth once a week.    Yes Historical Provider, MD  omeprazole (PRILOSEC) 40 MG capsule Take 1 capsule (40 mg total) by mouth daily. 08/05/15  Yes Collin Nova, MD  tiotropium (SPIRIVA HANDIHALER) 18 MCG inhalation capsule Place into inhaler and inhale 2 (two) times daily.  05/20/14  Yes Historical Provider, MD  traZODone (DESYREL) 50 MG tablet Take 1 tablet (50 mg total) by mouth at bedtime. 08/05/15  Yes Collin Nova, MD      VITAL SIGNS:  Blood pressure 143/78, pulse 69, temperature 97.7 F (36.5 C), temperature source Oral, resp. rate 35, height '5\' 8"'$  (1.727 m), weight 61.236 kg (135 lb), SpO2 94 %.  PHYSICAL EXAMINATION:  GENERAL:  80 y.o.-year-old patient lying in the bed with no acute distress.  EYES: Pupils equal, round, reactive to light and accommodation. No scleral icterus. Extraocular muscles intact.  HEENT: Head atraumatic, normocephalic. Oropharynx and nasopharynx clear.  NECK:  Supple, no jugular venous distention. No thyroid enlargement, no tenderness.  LUNGS: Normal breath sounds bilaterally, no wheezing, rales,rhonchi or crepitation. No use of accessory muscles of respiration.  CARDIOVASCULAR: S1, S2 normal. No murmurs, rubs, or gallops.  ABDOMEN: Soft, tenderness in lower abdomen left greater than right, nondistended. Bowel sounds present. No organomegaly or mass.  EXTREMITIES: No pedal edema, cyanosis, or clubbing.  NEUROLOGIC: Cranial nerves II through XII are intact. Muscle strength 5/5 in all extremities. Sensation intact. Gait not checked.  PSYCHIATRIC: The patient is alert and oriented x 3.  SKIN: No rash, lesion, or ulcer.   LABORATORY PANEL:   CBC  Recent Labs Lab 08/18/15 0955  WBC 16.0*  HGB 12.9*  HCT 39.2*  PLT 216   ------------------------------------------------------------------------------------------------------------------  Chemistries   Recent Labs Lab 08/18/15 0955  NA 137  K 4.6  CL 104  CO2 26  GLUCOSE 107*  BUN 24*  CREATININE 1.24  CALCIUM 9.4  AST 22  ALT 11*  ALKPHOS 97  BILITOT 0.6   ------------------------------------------------------------------------------------------------------------------    RADIOLOGY:  Ct Abdomen Pelvis W Contrast  08/18/2015  CLINICAL DATA:  Lower abdominal pain since yesterday. Left lower quadrant pain. EXAM: CT ABDOMEN AND PELVIS WITH CONTRAST TECHNIQUE: Multidetector CT imaging of the  abdomen and pelvis was performed using the standard protocol following bolus administration of intravenous contrast. CONTRAST:  135m ISOVUE-300 IOPAMIDOL (ISOVUE-300) INJECTION 61% COMPARISON:  The PET CT 06/23/2015 FINDINGS: Lower chest: Lung bases are clear. No effusions. Heart is normal size. Hepatobiliary: No focal hepatic abnormality. Gallbladder unremarkable. Pancreas: Mild prominence of the pancreatic duct. No focal pancreatic abnormality. Spleen: No focal abnormality.  Normal size. Adrenals/Urinary Tract: No adrenal abnormality. No focal renal abnormality. No stones or hydronephrosis. Urinary bladder is unremarkable. Stomach/Bowel: There is colonic wall thickening involving the distal transverse colon, splenic flexure, descending colon and sigmoid colon compatible with colitis. No evidence of bowel obstruction. Stomach and small bowel grossly unremarkable. Vascular/Lymphatic: Scattered areas of atherosclerotic calcifications in the aorta and iliac vessels. No aneurysm. No adenopathy. Reproductive: Radiation seeds noted in the region of the prostate. Other: Small amount of free fluid in the pelvis and adjacent to the liver. No free air. Musculoskeletal: Degenerative changes in the lower lumbar spine. No acute bony abnormality. IMPRESSION: Colonic wall thickening from the distal transverse colon into the sigmoid colon compatible with colitis. Associated small amount of free fluid  in the pelvis and adjacent to the liver. Aortic atherosclerosis. Radiation seeds in the prostate. Electronically Signed   By: Rolm Baptise M.D.   On: 08/18/2015 11:08    EKG:   Not ordered by ER physician  IMPRESSION AND PLAN:   1. Colitis seen on CT scan, leukocytosis. Empiric Cipro and Flagyl ordered. I ordered stool studies and C. difficile testing. Continue to follow while here. As needed nausea and pain medication. The small amount of blood with the bowel movements is likely related to the colitis rather than a GI  bleed. Hold aspirin for now 2. GERD on PPI. If C. difficile turns out to be positive consider switching the PPI over-the-counter H2 blocker. 3. COPD. Respiratory status stable. Continue inhalers. 4. History of lung cancer 5. History of prostate cancer 6. Rheumatoid arthritis hold methotrexate while on antibiotics.  All the records are reviewed and case discussed with ED provider. Management plans discussed with the patient, family and they are in agreement.  CODE STATUS: DO NOT RESUSCITATE  TOTAL TIME TAKING CARE OF THIS PATIENT: 50 minutes.    Loletha Grayer M.D on 08/18/2015 at 1:16 PM  Between 7am to 6pm - Pager - [phone]  After 6pm call admission pager [phone]  [facility]  [phone]  CC: Primary care physician; Collin Rake, MD

## 2015-08-19 LAB — CBC
HCT: 32.2 % — ABNORMAL LOW (ref 40.0–52.0)
Hemoglobin: 10.9 g/dL — ABNORMAL LOW (ref 13.0–18.0)
MCH: 31.6 pg (ref 26.0–34.0)
MCHC: 33.9 g/dL (ref 32.0–36.0)
MCV: 93.2 fL (ref 80.0–100.0)
PLATELETS: 170 10*3/uL (ref 150–440)
RBC: 3.45 MIL/uL — ABNORMAL LOW (ref 4.40–5.90)
RDW: 16.5 % — ABNORMAL HIGH (ref 11.5–14.5)
WBC: 10.7 10*3/uL — ABNORMAL HIGH (ref 3.8–10.6)

## 2015-08-19 LAB — BASIC METABOLIC PANEL
Anion gap: 5 (ref 5–15)
BUN: 18 mg/dL (ref 6–20)
CO2: 23 mmol/L (ref 22–32)
Calcium: 8.1 mg/dL — ABNORMAL LOW (ref 8.9–10.3)
Chloride: 107 mmol/L (ref 101–111)
Creatinine, Ser: 1.22 mg/dL (ref 0.61–1.24)
GFR calc Af Amer: >60 mL/min (ref >60)
GFR calc non Af Amer: 55 mL/min — ABNORMAL LOW (ref >60)
Glucose, Bld: 94 mg/dL (ref 65–99)
Potassium: 3.7 mmol/L (ref 3.5–5.1)
Sodium: 135 mmol/L (ref 135–145)

## 2015-08-19 LAB — C DIFFICILE QUICK SCREEN W PCR REFLEX
C DIFFICILE (CDIFF) TOXIN: NEGATIVE
C DIFFICLE (CDIFF) ANTIGEN: NEGATIVE
C Diff interpretation: NEGATIVE

## 2015-08-19 MED ORDER — SODIUM CHLORIDE 0.9 % IV SOLN
INTRAVENOUS | Status: DC
  Administered 2015-08-19: 17:00:00 via INTRAVENOUS

## 2015-08-19 NOTE — Progress Notes (Signed)
Patient ID: GLYNN YEPES, male   DOB: November 18, 1935, 80 y.o.   MRN: [id number] [place] [facility] [place] NAME: Collin Gonzales    MR#:  [id number]  DATE OF BIRTH:  05-26-1935  Came in with increasing bloody stools 2 yesterday and 1 today. Found to have acute colitis. Patient denies any complaints today. He had 1 bloody melenic bowel movement today.  REVIEW OF S YSTEMS:   Review of Systems  Constitutional: Negative for fever, chills and weight loss.  HENT: Negative for ear discharge, ear pain and nosebleeds.   Eyes: Negative for blurred vision, pain and discharge.  Respiratory: Negative for sputum production, shortness of breath, wheezing and stridor.   Cardiovascular: Negative for chest pain, palpitations, orthopnea and PND.  Gastrointestinal: Positive for blood in stool. Negative for nausea, vomiting and diarrhea.  Genitourinary: Negative for urgency and frequency.  Musculoskeletal: Negative for back pain and joint pain.  Neurological: Positive for weakness. Negative for sensory change, speech change and focal weakness.  Psychiatric/Behavioral: Negative for depression and hallucinations. The patient is not nervous/anxious.   All other systems reviewed and are negative.  Tolerating Diet:FLD Tolerating PT: not needed  DRUG ALLERGIES:   Allergies  Allergen Reactions  . Plaquenil [Hydroxychloroquine]   . Simvastatin Other (See Comments)    Bad dreams    VITALS:  Blood pressure 93/51, pulse 92, temperature 98.7 F (37.1 C), temperature source Oral, resp. rate 16, height '5\' 8"'$  (1.727 m), weight 60.782 kg (134 lb), SpO2 96 %.  PHYSICAL EXAMINATION:   Physical Exam  GENERAL:  80 y.o.-year-old patient lying in the bed with no acute distress.  EYES: Pupils equal, round, reactive to light and accommodation. No scleral icterus. Extraocular muscles intact.  HEENT: Head atraumatic, normocephalic. Oropharynx and nasopharynx clear.  NECK:   Supple, no jugular venous distention. No thyroid enlargement, no tenderness.  LUNGS: Normal breath sounds bilaterally, no wheezing, rales, rhonchi. No use of accessory muscles of respiration.  CARDIOVASCULAR: S1, S2 normal. No murmurs, rubs, or gallops.  ABDOMEN: Soft, nontender, nondistended. Bowel sounds present. No organomegaly or mass.  EXTREMITIES: No cyanosis, clubbing or edema b/l.    NEUROLOGIC: Cranial nerves II through XII are intact. No focal Motor or sensory deficits b/l.   PSYCHIATRIC:  patient is alert and oriented x 3.  SKIN: No obvious rash, lesion, or ulcer.   LABORATORY PANEL:  CBC  Recent Labs Lab 08/19/15 0508  WBC 10.7*  HGB 10.9*  HCT 32.2*  PLT 170    Chemistries   Recent Labs Lab 08/18/15 0955 08/19/15 0508  NA 137 135  K 4.6 3.7  CL 104 107  CO2 26 23  GLUCOSE 107* 94  BUN 24* 18  CREATININE 1.24 1.22  CALCIUM 9.4 8.1*  AST 22  --   ALT 11*  --   ALKPHOS 97  --   BILITOT 0.6  --    Cardiac Enzymes No results for input(s): TROPONINI in the last 168 hours. RADIOLOGY:  Ct Abdomen Pelvis W Contrast  08/18/2015  CLINICAL DATA:  Lower abdominal pain since yesterday. Left lower quadrant pain. EXAM: CT ABDOMEN AND PELVIS WITH CONTRAST TECHNIQUE: Multidetector CT imaging of the abdomen and pelvis was performed using the standard protocol following bolus administration of intravenous contrast. CONTRAST:  161m ISOVUE-300 IOPAMIDOL (ISOVUE-300) INJECTION 61% COMPARISON:  The PET CT 06/23/2015 FINDINGS: Lower chest: Lung bases are clear. No effusions. Heart is normal size. Hepatobiliary: No focal hepatic abnormality. Gallbladder unremarkable.  Pancreas: Mild prominence of the pancreatic duct. No focal pancreatic abnormality. Spleen: No focal abnormality.  Normal size. Adrenals/Urinary Tract: No adrenal abnormality. No focal renal abnormality. No stones or hydronephrosis. Urinary bladder is unremarkable. Stomach/Bowel: There is colonic wall thickening involving  the distal transverse colon, splenic flexure, descending colon and sigmoid colon compatible with colitis. No evidence of bowel obstruction. Stomach and small bowel grossly unremarkable. Vascular/Lymphatic: Scattered areas of atherosclerotic calcifications in the aorta and iliac vessels. No aneurysm. No adenopathy. Reproductive: Radiation seeds noted in the region of the prostate. Other: Small amount of free fluid in the pelvis and adjacent to the liver. No free air. Musculoskeletal: Degenerative changes in the lower lumbar spine. No acute bony abnormality. IMPRESSION: Colonic wall thickening from the distal transverse colon into the sigmoid colon compatible with colitis. Associated small amount of free fluid in the pelvis and adjacent to the liver. Aortic atherosclerosis. Radiation seeds in the prostate. Electronically Signed   By: Rolm Baptise M.D.   On: 08/18/2015 11:08   ASSESSMENT AND PLAN:  Collin Gonzales is a 80 y.o. male presents with lower abdominal pain and some diarrhea with some blood. He quantifies the blood as about a teaspoonful and he saw that a few times. He's been having a few bowel movements a day but yesterday had a large episode of bowel movement. He describes the pain as in the lower abdomen constant 4 out of 10 intensity  1. Acute severe  Colitis seen on CT scan, leukocytosis. -IV Cipro and Flagyl ordered.  - C. Difficile negative -  As needed nausea and pain medication. The small amount of blood with the bowel movements is likely related to the colitis rather than a GI bleed. Hold aspirin for now -GI consult if needed  2. GERD on PPI. If C. difficile turns out to be positive consider switching the PPI over-the-counter H2 blocker.  3. COPD. Respiratory status stable. Continue inhalers.  4. History of lung cancer  5. History of prostate cancer  6. Rheumatoid arthritis hold methotrexate while on antibiotics.   Case discussed with Care Management/Social Worker. Management  plans discussed with the patient, family and they are in agreement.  CODE STATUS:full  DVT Prophylaxis: SCD  TOTAL TIME TAKING CARE OF THIS PATIENT: 30 minutes.  >50% time spent on counselling and coordination of care  POSSIBLE D/C IN 1-2 DAYS, DEPENDING ON CLINICAL CONDITION.  Note: This dictation was prepared with Dragon dictation along with smaller phrase technology. Any transcriptional errors that result from this process are unintentional.  Collin Gonzales M.D on 08/19/2015 at 11:54 AM  Between 7am to 6pm - Pager - [phone]  After 6pm go to [email or web address] - password [email or web address]  [facility]  Office  [phone]  CC: Primary care physician; Keith Rake, MD

## 2015-08-19 NOTE — Progress Notes (Signed)
Transferred pt care to Marciano Sequin, RN.

## 2015-08-20 LAB — CBC
HEMATOCRIT: 32.8 % — AB (ref 40.0–52.0)
Hemoglobin: 10.9 g/dL — ABNORMAL LOW (ref 13.0–18.0)
MCH: 31.4 pg (ref 26.0–34.0)
MCHC: 33.2 g/dL (ref 32.0–36.0)
MCV: 94.4 fL (ref 80.0–100.0)
PLATELETS: 164 10*3/uL (ref 150–440)
RBC: 3.47 MIL/uL — ABNORMAL LOW (ref 4.40–5.90)
RDW: 16.5 % — AB (ref 11.5–14.5)
WBC: 8.4 10*3/uL (ref 3.8–10.6)

## 2015-08-20 MED ORDER — METRONIDAZOLE 500 MG PO TABS: 500.0000 mg | ORAL_TABLET | Freq: Three times a day (TID) | ORAL | Status: DC

## 2015-08-20 MED ORDER — CIPROFLOXACIN HCL 500 MG PO TABS
500.0000 mg | ORAL_TABLET | Freq: Two times a day (BID) | ORAL | Status: DC
  Administered 2015-08-20: 500 mg via ORAL
  Filled 2015-08-20: qty 1

## 2015-08-20 MED ORDER — OXYCODONE HCL 5 MG PO TABS: 5.0000 mg | ORAL_TABLET | ORAL | Status: DC | PRN

## 2015-08-20 MED ORDER — CIPROFLOXACIN HCL 500 MG PO TABS: 500.0000 mg | ORAL_TABLET | Freq: Two times a day (BID) | ORAL | Status: DC

## 2015-08-20 MED ORDER — METRONIDAZOLE 500 MG PO TABS
500.0000 mg | ORAL_TABLET | Freq: Three times a day (TID) | ORAL | Status: DC
  Administered 2015-08-20: 500 mg via ORAL
  Filled 2015-08-20: qty 1

## 2015-08-20 NOTE — Progress Notes (Signed)
Pt to be discharged per Md order. IV removed. Instructions reviewed with pt and family. All questions answered. Script given to pt. Pt will be taken out in wheelchair

## 2015-08-20 NOTE — Care Management Important Message (Signed)
Important Message  Patient Details  Name: Collin Gonzales MRN: [id number] Date of Birth: 1935-10-21   Medicare Important Message Given:  N/A - LOS <3 / Initial given by admissions    Beverly Sessions, RN 08/20/2015, 10:05 AM

## 2015-08-20 NOTE — Discharge Summary (Signed)
[facility] [place] NAME: Collin Gonzales    MR#:  [id number]  DATE OF BIRTH:  Aug 19, 1935  DATE OF ADMISSION:  08/18/2015 ADMITTING PHYSICIAN: Loletha Grayer, MD  DATE OF DISCHARGE: 08/20/15  PRIMARY CARE PHYSICIAN: Keith Rake, MD    ADMISSION DIAGNOSIS:  Colitis [K52.9]  DISCHARGE DIAGNOSIS:  Acute Colitis  SECONDARY DIAGNOSIS:   Past Medical History  Diagnosis Date  . GERD (gastroesophageal reflux disease)   . COPD (chronic obstructive pulmonary disease) ([place])   . Arthritis     rheumatoid arthritis  . Insomnia   . History of prostate cancer   . Collagen vascular disease ([place])   . Cancer ([place])   . Lung cancer [facility])     HOSPITAL COURSE:  Collin Gonzales is a 80 y.o. male presents with lower abdominal pain and some diarrhea with some blood. He quantifies the blood as about a teaspoonful and he saw that a few times. He's been having a few bowel movements a day but yesterday had a large episode of bowel movement. He describes the pain as in the lower abdomen constant 4 out of 10 intensity  1. Acute  Colitis seen on CT scan, leukocytosis. -IV Cipro and Flagyl.change to po abxs  - C. Difficile negative - As needed nausea and pain medication. The small amount of blood with the bowel movements is likely related to the colitis rather than a GI bleed. Hold aspirin for now -no more bloody stools -hgb stable at 10.9 -leucocytosis resolved  2. GERD on PPI.   3. COPD. Respiratory status stable. Continue inhalers.  4. History of lung cancer  5. History of prostate cancer  6. Rheumatoid arthritis hold methotrexate while on antibiotics.  Overall feels better. D/c home Left msg for sister Unable to reach dter [name] CONSULTS OBTAINED:     DRUG ALLERGIES:   Allergies  Allergen Reactions  . Plaquenil [Hydroxychloroquine]   . Simvastatin Other (See Comments)    Bad dreams    DISCHARGE MEDICATIONS:    Current Discharge Medication List    START taking these medications   Details  ciprofloxacin (CIPRO) 500 MG tablet Take 1 tablet (500 mg total) by mouth 2 (two) times daily. Qty: 16 tablet, Refills: 0    metroNIDAZOLE (FLAGYL) 500 MG tablet Take 1 tablet (500 mg total) by mouth 3 (three) times daily. Qty: 24 tablet, Refills: 0    oxyCODONE (OXY IR/ROXICODONE) 5 MG immediate release tablet Take 1 tablet (5 mg total) by mouth every 4 (four) hours as needed for moderate pain. Qty: 30 tablet, Refills: 0      CONTINUE these medications which have NOT CHANGED   Details  albuterol (PROAIR HFA) 108 (90 BASE) MCG/ACT inhaler Inhale 2 puffs into the lungs every 4 (four) hours as needed for wheezing.     aspirin EC 81 MG tablet Take 81 mg by mouth daily.    B COMPLEX-C PO Take 1 tablet by mouth daily.     Fluticasone-Salmeterol (ADVAIR DISKUS) 250-50 MCG/DOSE AEPB Inhale 1 puff into the lungs every morning.     methotrexate 2.5 MG tablet Take 21.5 mg by mouth once a week.     omeprazole (PRILOSEC) 40 MG capsule Take 1 capsule (40 mg total) by mouth daily. Qty: 90 capsule, Refills: 1   Associated Diagnoses: Gastroesophageal reflux disease, esophagitis presence not specified    tiotropium (SPIRIVA HANDIHALER) 18 MCG inhalation capsule Place into inhaler and inhale 2 (two) times daily.  traZODone (DESYREL) 50 MG tablet Take 1 tablet (50 mg total) by mouth at bedtime. Qty: 90 tablet, Refills: 1   Associated Diagnoses: Chronic insomnia        If you experience worsening of your admission symptoms, develop shortness of breath, life threatening emergency, suicidal or homicidal thoughts you must seek medical attention immediately by calling 911 or calling your MD immediately  if symptoms less severe.  You Must read complete instructions/literature along with all the possible adverse reactions/side effects for all the Medicines you take and that have been prescribed to you. Take any new  Medicines after you have completely understood and accept all the possible adverse reactions/side effects.   Please note  You were cared for by a hospitalist during your hospital stay. If you have any questions about your discharge medications or the care you received while you were in the hospital after you are discharged, you can call the unit and asked to speak with the hospitalist on call if the hospitalist that took care of you is not available. Once you are discharged, your primary care physician will handle any further medical issues. Please note that NO REFILLS for any discharge medications will be authorized once you are discharged, as it is imperative that you return to your primary care physician (or establish a relationship with a primary care physician if you do not have one) for your aftercare needs so that they can reassess your need for medications and monitor your lab values. Today   SUBJECTIVE   Feels better. No more bloody stools. No abd pain  VITAL SIGNS:  Blood pressure 124/64, pulse 82, temperature 98.3 F (36.8 C), temperature source Oral, resp. rate 18, height '5\' 8"'$  (1.727 m), weight 60.782 kg (134 lb), SpO2 92 %.  I/O:   Intake/Output Summary (Last 24 hours) at 08/20/15 0907 Last data filed at 08/20/15 0700  Gross per 24 hour  Intake 2101.25 ml  Output   1125 ml  Net 976.25 ml    PHYSICAL EXAMINATION:  GENERAL:  80 y.o.-year-old patient lying in the bed with no acute distress.  EYES: Pupils equal, round, reactive to light and accommodation. No scleral icterus. Extraocular muscles intact.  HEENT: Head atraumatic, normocephalic. Oropharynx and nasopharynx clear.  NECK:  Supple, no jugular venous distention. No thyroid enlargement, no tenderness.  LUNGS: Normal breath sounds bilaterally, no wheezing, rales,rhonchi or crepitation. No use of accessory muscles of respiration.  CARDIOVASCULAR: S1, S2 normal. No murmurs, rubs, or gallops.  ABDOMEN: Soft, non-tender,  non-distended. Bowel sounds present. No organomegaly or mass.  EXTREMITIES: No pedal edema, cyanosis, or clubbing.  NEUROLOGIC: Cranial nerves II through XII are intact. Muscle strength 5/5 in all extremities. Sensation intact. Gait not checked.  PSYCHIATRIC: The patient is alert and oriented x 3.  SKIN: No obvious rash, lesion, or ulcer.   DATA REVIEW:   CBC   Recent Labs Lab 08/20/15 0554  WBC 8.4  HGB 10.9*  HCT 32.8*  PLT 164    Chemistries   Recent Labs Lab 08/18/15 0955 08/19/15 0508  NA 137 135  K 4.6 3.7  CL 104 107  CO2 26 23  GLUCOSE 107* 94  BUN 24* 18  CREATININE 1.24 1.22  CALCIUM 9.4 8.1*  AST 22  --   ALT 11*  --   ALKPHOS 97  --   BILITOT 0.6  --     Microbiology Results   Recent Results (from the past 240 hour(s))  C difficile quick scan w  PCR reflex     Status: None   Collection Time: 08/19/15  6:15 AM  Result Value Ref Range Status   C Diff antigen NEGATIVE NEGATIVE Final   C Diff toxin NEGATIVE NEGATIVE Final   C Diff interpretation Negative for C. difficile  Final    RADIOLOGY:  Ct Abdomen Pelvis W Contrast  08/18/2015  CLINICAL DATA:  Lower abdominal pain since yesterday. Left lower quadrant pain. EXAM: CT ABDOMEN AND PELVIS WITH CONTRAST TECHNIQUE: Multidetector CT imaging of the abdomen and pelvis was performed using the standard protocol following bolus administration of intravenous contrast. CONTRAST:  141m ISOVUE-300 IOPAMIDOL (ISOVUE-300) INJECTION 61% COMPARISON:  The PET CT 06/23/2015 FINDINGS: Lower chest: Lung bases are clear. No effusions. Heart is normal size. Hepatobiliary: No focal hepatic abnormality. Gallbladder unremarkable. Pancreas: Mild prominence of the pancreatic duct. No focal pancreatic abnormality. Spleen: No focal abnormality.  Normal size. Adrenals/Urinary Tract: No adrenal abnormality. No focal renal abnormality. No stones or hydronephrosis. Urinary bladder is unremarkable. Stomach/Bowel: There is colonic wall  thickening involving the distal transverse colon, splenic flexure, descending colon and sigmoid colon compatible with colitis. No evidence of bowel obstruction. Stomach and small bowel grossly unremarkable. Vascular/Lymphatic: Scattered areas of atherosclerotic calcifications in the aorta and iliac vessels. No aneurysm. No adenopathy. Reproductive: Radiation seeds noted in the region of the prostate. Other: Small amount of free fluid in the pelvis and adjacent to the liver. No free air. Musculoskeletal: Degenerative changes in the lower lumbar spine. No acute bony abnormality. IMPRESSION: Colonic wall thickening from the distal transverse colon into the sigmoid colon compatible with colitis. Associated small amount of free fluid in the pelvis and adjacent to the liver. Aortic atherosclerosis. Radiation seeds in the prostate. Electronically Signed   By: KRolm BaptiseM.D.   On: 08/18/2015 11:08     Management plans discussed with the patient, family and they are in agreement.  CODE STATUS:     Code Status Orders        Start     Ordered   08/18/15 1312  Do not attempt resuscitation (DNR)   Continuous    Question Answer Comment  In the event of cardiac or respiratory ARREST Do not call a "code blue"   In the event of cardiac or respiratory ARREST Do not perform Intubation, CPR, defibrillation or ACLS   In the event of cardiac or respiratory ARREST Use medication by any route, position, wound care, and other measures to relive pain and suffering. May use oxygen, suction and manual treatment of airway obstruction as needed for comfort.   Comments nurse may pronounce      08/18/15 1311    Code Status History    Date Active Date Inactive Code Status Order ID Comments User Context   This patient has a current code status but no historical code status.      TOTAL TIME TAKING CARE OF THIS PATIENT: 40  minutes.    Daquane Aguilar M.D on 08/20/2015 at 9:07 AM  Between 7am to 6pm - Pager -  [phone] After 6pm go to [email or web address] - password [email or web address] [facility]  Office  [phone] CC: Primary care physician; SKeith Rake MD

## 2015-08-20 NOTE — Discharge Instructions (Signed)
Liquid diet for 2 days and then slowly introduce soft diet Take yoghurt and drink gatorade

## 2015-08-20 NOTE — Care Management Important Message (Signed)
Important Message  Patient Details  Name: JAMARQUES PINEDO MRN: [id number] Date of Birth: 1935/06/15   Medicare Important Message Given:  Yes    Juliann Pulse A Sheneika Walstad 08/20/2015, 10:27 AM

## 2015-08-21 LAB — MISC LABCORP TEST (SEND OUT): Labcorp test code: 183480

## 2015-09-01 ENCOUNTER — Ambulatory Visit (INDEPENDENT_AMBULATORY_CARE_PROVIDER_SITE_OTHER): Payer: Commercial Managed Care - HMO | Admitting: Family Medicine

## 2015-09-01 ENCOUNTER — Encounter: Payer: Self-pay | Admitting: Family Medicine

## 2015-09-01 VITALS — BP 122/70 | HR 87 | Temp 97.9°F | Resp 18 | Ht 68.0 in | Wt 131.2 lb

## 2015-09-01 DIAGNOSIS — A09 Infectious gastroenteritis and colitis, unspecified: Secondary | ICD-10-CM

## 2015-09-01 DIAGNOSIS — R630 Anorexia: Secondary | ICD-10-CM

## 2015-09-01 DIAGNOSIS — K529 Noninfective gastroenteritis and colitis, unspecified: Secondary | ICD-10-CM

## 2015-09-01 DIAGNOSIS — R197 Diarrhea, unspecified: Secondary | ICD-10-CM

## 2015-09-01 MED ORDER — MIRTAZAPINE 15 MG PO TABS: 15.0000 mg | ORAL_TABLET | Freq: Every day | ORAL | Status: DC

## 2015-09-01 NOTE — Progress Notes (Signed)
Name: Collin Gonzales   MRN: [id number]    DOB: June 08, 1935   Date:09/01/2015       Progress Note  Subjective  Chief Complaint  Chief Complaint  Patient presents with  . Hospitalization Follow-up    HPI  Hospital Follow Up:  Pt. Is being seen for hospital follow up. Admitted on June 28, with 1 episode of bloody diarrhea, CT Scan revealed colitis in the distal transverse to sigmoid colon. He was on antibiotics (Cipro and Flagyl), CBC showed slightly decreased Hematocrit. C. Diff is negative. Pt. Is back to baseline, no more bloody stools or diarrhea, dyspnea at baseline (has COPD). Last colonoscopy over 10 years ago.  Patient also requesting something to help increase his appetite, feels lik his appetite is not what it used to be, as history of chronic illnesses including COPD, rheumatoid arthritis. BMI is 19.95, no recent weight loss.  Past Medical History  Diagnosis Date  . GERD (gastroesophageal reflux disease)   . COPD (chronic obstructive pulmonary disease) ([place])   . Arthritis     rheumatoid arthritis  . Insomnia   . History of prostate cancer   . Collagen vascular disease ([place])   . Cancer ([place])   . Lung cancer [facility])     Past Surgical History  Procedure Laterality Date  . Appendectomy  1965  . Hernia repair  1965  . Endobronchial ultrasound N/A 02/26/2015    Procedure: ENDOBRONCHIAL ULTRASOUND;  Surgeon: Flora Lipps, MD;  Location: [facility] ORS;  Service: Cardiopulmonary;  Laterality: N/A;    Family History  Problem Relation Age of Onset  . Cancer Mother   . Cancer Sister   . Cancer Brother   . Cancer Father     Lung Cancer    Social History   Social History  . Marital Status: Single    Spouse Name: N/A  . Number of Children: N/A  . Years of Education: N/A   Occupational History  . Not on file.   Social History Main Topics  . Smoking status: Former Smoker    Quit date: 02/21/2013  . Smokeless tobacco: Not on file  . Alcohol Use: No  . Drug Use: No  .  Sexual Activity: Not on file   Other Topics Concern  . Not on file   Social History Narrative     Current outpatient prescriptions:  .  albuterol (PROAIR HFA) 108 (90 BASE) MCG/ACT inhaler, Inhale 2 puffs into the lungs every 4 (four) hours as needed for wheezing. , Disp: , Rfl:  .  aspirin EC 81 MG tablet, Take 81 mg by mouth daily., Disp: , Rfl:  .  B COMPLEX-C PO, Take 1 tablet by mouth daily. , Disp: , Rfl:  .  Fluticasone-Salmeterol (ADVAIR DISKUS) 250-50 MCG/DOSE AEPB, Inhale 1 puff into the lungs every morning. , Disp: , Rfl:  .  methotrexate 2.5 MG tablet, Take 21.5 mg by mouth once a week. , Disp: , Rfl:  .  omeprazole (PRILOSEC) 40 MG capsule, Take 1 capsule (40 mg total) by mouth daily., Disp: 90 capsule, Rfl: 1 .  oxyCODONE (OXY IR/ROXICODONE) 5 MG immediate release tablet, Take 1 tablet (5 mg total) by mouth every 4 (four) hours as needed for moderate pain., Disp: 30 tablet, Rfl: 0 .  tiotropium (SPIRIVA HANDIHALER) 18 MCG inhalation capsule, Place into inhaler and inhale 2 (two) times daily. , Disp: , Rfl:  .  traZODone (DESYREL) 50 MG tablet, Take 1 tablet (50 mg total) by mouth at bedtime.,  Disp: 90 tablet, Rfl: 1  Allergies  Allergen Reactions  . Plaquenil [Hydroxychloroquine]   . Simvastatin Other (See Comments)    Bad dreams     Review of Systems  Constitutional: Negative for fever, chills and malaise/fatigue.  Respiratory: Negative for shortness of breath.   Cardiovascular: Negative for chest pain.  Gastrointestinal: Negative for abdominal pain, diarrhea, constipation and blood in stool.  Genitourinary: Negative for dysuria and hematuria.    Objective  Filed Vitals:   09/01/15 0835  BP: 122/70  Pulse: 87  Temp: 97.9 F (36.6 C)  TempSrc: Oral  Resp: 18  Height: '5\' 8"'$  (1.727 m)  Weight: 131 lb 3.2 oz (59.512 kg)  SpO2: 98%    Physical Exam  Constitutional: He is well-developed, well-nourished, and in no distress.  Cardiovascular: Normal rate,  regular rhythm and normal heart sounds.   No murmur heard. Pulmonary/Chest: Effort normal and breath sounds normal. He has no wheezes.  Abdominal: Soft. Bowel sounds are normal. There is no tenderness.  Genitourinary: Rectum normal. Guaiac negative stool.  Psychiatric: Mood, memory, affect and judgment normal.  Nursing note and vitals reviewed.    Assessment & Plan  1. Acute colitis Now resolved, completed antibiotic therapy. Referral to gastroenterology for possible colonoscopy - Ambulatory referral to Gastroenterology  2. Bloody diarrhea Negative for blood in office. - POC Hemoccult Bld/Stl (1-Cd Office Dx)  3. Decrease in appetite We'll start on Remeron as an appetite stimulant, educated on potential drug interaction with trazodone which may increase the risk of serotonin syndrome. Follow-up in 3 months - mirtazapine (REMERON) 15 MG tablet; Take 1 tablet (15 mg total) by mouth at bedtime.  Dispense: 90 tablet; Refill: 0   Linh Hedberg [facility] 09/01/2015 9:02 AM

## 2015-09-02 LAB — POC HEMOCCULT BLD/STL (OFFICE/1-CARD/DIAGNOSTIC)
FECAL OCCULT BLD: NEGATIVE
OCCULT BLOOD DATE: NEGATIVE

## 2015-09-04 ENCOUNTER — Telehealth: Payer: Self-pay | Admitting: Family Medicine

## 2015-09-04 DIAGNOSIS — Z8546 Personal history of malignant neoplasm of prostate: Secondary | ICD-10-CM

## 2015-09-04 NOTE — Telephone Encounter (Signed)
Pt has an appt @ [place] on 09/07/2015 for prostate issues. He is going to be seeing Dr Mare Ferrari

## 2015-09-06 DIAGNOSIS — J449 Chronic obstructive pulmonary disease, unspecified: Secondary | ICD-10-CM | POA: Diagnosis not present

## 2015-09-07 DIAGNOSIS — R350 Frequency of micturition: Secondary | ICD-10-CM | POA: Diagnosis not present

## 2015-09-07 DIAGNOSIS — D400 Neoplasm of uncertain behavior of prostate: Secondary | ICD-10-CM | POA: Diagnosis not present

## 2015-09-07 DIAGNOSIS — N5201 Erectile dysfunction due to arterial insufficiency: Secondary | ICD-10-CM | POA: Diagnosis not present

## 2015-09-07 DIAGNOSIS — C61 Malignant neoplasm of prostate: Secondary | ICD-10-CM | POA: Diagnosis not present

## 2015-09-07 NOTE — Telephone Encounter (Signed)
Thanks

## 2015-09-07 NOTE — Telephone Encounter (Signed)
Pt will need a referral

## 2015-09-07 NOTE — Telephone Encounter (Signed)
Patient seen by urology (Dr. Eliberto Ivory) this morning and referral was not needed.

## 2015-09-17 ENCOUNTER — Ambulatory Visit
Admission: RE | Admit: 2015-09-17 | Discharge: 2015-09-17 | Disposition: A | Discharge status: Home / Self Care | Payer: Commercial Managed Care - HMO | Source: Ambulatory Visit | Attending: Radiation Oncology | Admitting: Radiation Oncology

## 2015-09-17 ENCOUNTER — Other Ambulatory Visit: Payer: Self-pay | Admitting: *Deleted

## 2015-09-17 ENCOUNTER — Encounter: Payer: Self-pay | Admitting: Radiation Oncology

## 2015-09-17 VITALS — BP 94/52 | HR 91 | Temp 95.9°F | Wt 126.7 lb

## 2015-09-17 DIAGNOSIS — R918 Other nonspecific abnormal finding of lung field: Secondary | ICD-10-CM

## 2015-09-17 NOTE — Progress Notes (Signed)
Radiation Oncology Follow up Note  Name: Collin Gonzales   Date:   09/17/2015 MRN:  [id number] DOB: Dec 25, 1935    This 80 y.o. male presents to the clinic today for one-month follow-up for SB RT to right upper lobe for non-small cell lung cancer.  REFERRING PROVIDER: Roselee Nova, MD  HPI: Patient is a 80 year old male now one month out having completed radiation therapy to his right upper lobe using SB RT.Marland Kitchen He is seen today one month out and is doing well. He has slight nonproductive cough no dysphagia. He states he has seen no decline in his pulmonary capacity still using nasal oxygen. He's also being followed for his prostate cancer by urology.  COMPLICATIONS OF TREATMENT: none  FOLLOW UP COMPLIANCE: keeps appointments   PHYSICAL EXAM:  BP (!) 94/52 (BP Location: Left Arm, Patient Position: Sitting, Cuff Size: Normal)   Pulse 91   Temp (!) 95.9 F (35.5 C)   Wt 126 lb 10.5 oz (57.5 kg)   BMI 19.26 kg/m  Well-developed male in nasal oxygen in NAD. Well-developed well-nourished patient in NAD. HEENT reveals PERLA, EOMI, discs not visualized.  Oral cavity is clear. No oral mucosal lesions are identified. Neck is clear without evidence of cervical or supraclavicular adenopathy. Lungs are clear to A&P. Cardiac examination is essentially unremarkable with regular rate and rhythm without murmur rub or thrill. Abdomen is benign with no organomegaly or masses noted. Motor sensory and DTR levels are equal and symmetric in the upper and lower extremities. Cranial nerves II through XII are grossly intact. Proprioception is intact. No peripheral adenopathy or edema is identified. No motor or sensory levels are noted. Crude visual fields are within normal range.  RADIOLOGY RESULTS: CT scan in 3 months with contrast ordered  PLAN: Present time he is recovering well with no significant side effects or complaints from his SB RT treatment. I've asked to see him back in 3 months for follow-up  and ordered a CT scan with contrast prior to his visit. Patient continues to do well. I've asked to see him back again at 3 month interval. Patient knows to call sooner with any concerns.  I would like to take this opportunity to thank you for allowing me to participate in the care of your patient.Armstead Peaks., MD

## 2015-10-06 DIAGNOSIS — N5201 Erectile dysfunction due to arterial insufficiency: Secondary | ICD-10-CM | POA: Diagnosis not present

## 2015-10-06 DIAGNOSIS — N359 Urethral stricture, unspecified: Secondary | ICD-10-CM | POA: Diagnosis not present

## 2015-10-06 DIAGNOSIS — D400 Neoplasm of uncertain behavior of prostate: Secondary | ICD-10-CM | POA: Diagnosis not present

## 2015-10-07 DIAGNOSIS — R338 Other retention of urine: Secondary | ICD-10-CM | POA: Diagnosis not present

## 2015-10-07 DIAGNOSIS — D400 Neoplasm of uncertain behavior of prostate: Secondary | ICD-10-CM | POA: Diagnosis not present

## 2015-10-07 DIAGNOSIS — N359 Urethral stricture, unspecified: Secondary | ICD-10-CM | POA: Diagnosis not present

## 2015-10-07 DIAGNOSIS — J449 Chronic obstructive pulmonary disease, unspecified: Secondary | ICD-10-CM | POA: Diagnosis not present

## 2015-10-07 DIAGNOSIS — C61 Malignant neoplasm of prostate: Secondary | ICD-10-CM | POA: Diagnosis not present

## 2015-10-14 DIAGNOSIS — R338 Other retention of urine: Secondary | ICD-10-CM | POA: Diagnosis not present

## 2015-10-28 ENCOUNTER — Telehealth: Payer: Self-pay | Admitting: Family Medicine

## 2015-10-28 NOTE — Telephone Encounter (Signed)
[employer] Authorizaton has been completed with an approval # of [id number] starting 10/29/15 for 6 visits.

## 2015-11-02 DIAGNOSIS — H2513 Age-related nuclear cataract, bilateral: Secondary | ICD-10-CM | POA: Diagnosis not present

## 2015-11-02 DIAGNOSIS — H25013 Cortical age-related cataract, bilateral: Secondary | ICD-10-CM | POA: Diagnosis not present

## 2015-11-05 DIAGNOSIS — M0579 Rheumatoid arthritis with rheumatoid factor of multiple sites without organ or systems involvement: Secondary | ICD-10-CM | POA: Diagnosis not present

## 2015-11-05 DIAGNOSIS — R938 Abnormal findings on diagnostic imaging of other specified body structures: Secondary | ICD-10-CM | POA: Diagnosis not present

## 2015-11-05 DIAGNOSIS — R700 Elevated erythrocyte sedimentation rate: Secondary | ICD-10-CM | POA: Diagnosis not present

## 2015-11-07 DIAGNOSIS — J449 Chronic obstructive pulmonary disease, unspecified: Secondary | ICD-10-CM | POA: Diagnosis not present

## 2015-11-16 ENCOUNTER — Telehealth: Payer: Self-pay | Admitting: Family Medicine

## 2015-11-16 DIAGNOSIS — R351 Nocturia: Secondary | ICD-10-CM | POA: Diagnosis not present

## 2015-11-16 DIAGNOSIS — R3916 Straining to void: Secondary | ICD-10-CM | POA: Diagnosis not present

## 2015-11-16 DIAGNOSIS — R3914 Feeling of incomplete bladder emptying: Secondary | ICD-10-CM | POA: Diagnosis not present

## 2015-11-16 DIAGNOSIS — N359 Urethral stricture, unspecified: Secondary | ICD-10-CM | POA: Diagnosis not present

## 2015-11-16 DIAGNOSIS — M3390 Dermatopolymyositis, unspecified, organ involvement unspecified: Secondary | ICD-10-CM | POA: Diagnosis not present

## 2015-11-16 DIAGNOSIS — R300 Dysuria: Secondary | ICD-10-CM | POA: Diagnosis not present

## 2015-11-16 DIAGNOSIS — D400 Neoplasm of uncertain behavior of prostate: Secondary | ICD-10-CM | POA: Diagnosis not present

## 2015-11-16 DIAGNOSIS — R3915 Urgency of urination: Secondary | ICD-10-CM | POA: Diagnosis not present

## 2015-11-16 DIAGNOSIS — C61 Malignant neoplasm of prostate: Secondary | ICD-10-CM | POA: Diagnosis not present

## 2015-11-16 NOTE — Telephone Encounter (Signed)
Patient has [employer]. Had appointment with Mare Ferrari [facility]). Was seen there today and when he was leaving was told that he need an referral. Have been seen there multiple times. Was there for his prostate.

## 2015-11-24 DIAGNOSIS — H905 Unspecified sensorineural hearing loss: Secondary | ICD-10-CM | POA: Diagnosis not present

## 2015-12-02 ENCOUNTER — Ambulatory Visit (INDEPENDENT_AMBULATORY_CARE_PROVIDER_SITE_OTHER): Payer: Commercial Managed Care - HMO | Admitting: Family Medicine

## 2015-12-02 ENCOUNTER — Encounter: Payer: Self-pay | Admitting: Family Medicine

## 2015-12-02 VITALS — BP 100/77 | HR 78 | Temp 97.7°F | Resp 16 | Ht 68.0 in | Wt 125.2 lb

## 2015-12-02 DIAGNOSIS — J449 Chronic obstructive pulmonary disease, unspecified: Secondary | ICD-10-CM

## 2015-12-02 DIAGNOSIS — R630 Anorexia: Secondary | ICD-10-CM | POA: Diagnosis not present

## 2015-12-02 MED ORDER — MIRTAZAPINE 15 MG PO TABS: 15.0000 mg | ORAL_TABLET | Freq: Every day | ORAL | 0 refills | Status: DC

## 2015-12-02 NOTE — Progress Notes (Signed)
Name: Collin Gonzales   MRN: [id number]    DOB: Jul 27, 1935   Date:12/02/2015       Progress Note  Subjective  Chief Complaint  Chief Complaint  Patient presents with  . Follow-up    3 mo    HPI  Pt. Presents for 3 month check up. He feels well, appetite has been improving since he was started on Remeron 3 months ago. Feels like he is eating more than before, has not lost any weight. Would like to continue on Remeron  Past Medical History:  Diagnosis Date  . Arthritis    rheumatoid arthritis  . Cancer ([place])   . Collagen vascular disease ([place])   . COPD (chronic obstructive pulmonary disease) ([place])   . GERD (gastroesophageal reflux disease)   . History of prostate cancer   . Insomnia   . Lung cancer [facility])     Past Surgical History:  Procedure Laterality Date  . APPENDECTOMY  1965  . ENDOBRONCHIAL ULTRASOUND N/A 02/26/2015   Procedure: ENDOBRONCHIAL ULTRASOUND;  Surgeon: Collin Lipps, MD;  Location: [facility] ORS;  Service: Cardiopulmonary;  Laterality: N/A;  . HERNIA REPAIR  1965    Family History  Problem Relation Age of Onset  . Cancer Mother   . Cancer Father     Lung Cancer  . Cancer Sister   . Cancer Brother     Social History   Social History  . Marital status: Single    Spouse name: N/A  . Number of children: N/A  . Years of education: N/A   Occupational History  . Not on file.   Social History Main Topics  . Smoking status: Former Smoker    Quit date: 02/21/2013  . Smokeless tobacco: Never Used  . Alcohol use No  . Drug use: No  . Sexual activity: Not on file   Other Topics Concern  . Not on file   Social History Narrative  . No narrative on file     Current Outpatient Prescriptions:  .  albuterol (PROAIR HFA) 108 (90 BASE) MCG/ACT inhaler, Inhale 2 puffs into the lungs every 4 (four) hours as needed for wheezing. , Disp: , Rfl:  .  aspirin EC 81 MG tablet, Take 81 mg by mouth daily., Disp: , Rfl:  .  B COMPLEX-C PO, Take 1 tablet by mouth  daily. , Disp: , Rfl:  .  Fluticasone-Salmeterol (ADVAIR DISKUS) 250-50 MCG/DOSE AEPB, Inhale 1 puff into the lungs every morning. , Disp: , Rfl:  .  methotrexate 2.5 MG tablet, Take 21.5 mg by mouth once a week. , Disp: , Rfl:  .  mirtazapine (REMERON) 15 MG tablet, Take 1 tablet (15 mg total) by mouth at bedtime., Disp: 90 tablet, Rfl: 0 .  omeprazole (PRILOSEC) 40 MG capsule, Take 1 capsule (40 mg total) by mouth daily., Disp: 90 capsule, Rfl: 1 .  oxyCODONE (OXY IR/ROXICODONE) 5 MG immediate release tablet, Take 1 tablet (5 mg total) by mouth every 4 (four) hours as needed for moderate pain., Disp: 30 tablet, Rfl: 0 .  simvastatin (ZOCOR) 10 MG tablet, Take by mouth., Disp: , Rfl:  .  tamsulosin (FLOMAX) 0.4 MG CAPS capsule, , Disp: , Rfl:  .  tiotropium (SPIRIVA HANDIHALER) 18 MCG inhalation capsule, Place into inhaler and inhale 2 (two) times daily. , Disp: , Rfl:  .  traZODone (DESYREL) 50 MG tablet, Take 1 tablet (50 mg total) by mouth at bedtime., Disp: 90 tablet, Rfl: 1  Allergies  Allergen Reactions  .  Plaquenil [Hydroxychloroquine]   . Simvastatin Other (See Comments)    Bad dreams     Review of Systems  Constitutional: Negative for chills, fever, malaise/fatigue and weight loss.  Respiratory: Positive for cough, sputum production and shortness of breath.   Cardiovascular: Negative for chest pain.      Objective  Vitals:   12/02/15 0848  BP: 100/77  Pulse: 78  Resp: 16  Temp: 97.7 F (36.5 C)  TempSrc: Oral  SpO2: 93%  Weight: 125 lb 3.2 oz (56.8 kg)  Height: '5\' 8"'$  (1.727 m)    Physical Exam  Constitutional: He is oriented to person, place, and time and well-developed, well-nourished, and in no distress.  HENT:  Head: Normocephalic and atraumatic.  Cardiovascular: Normal rate, regular rhythm, S1 normal, S2 normal and normal heart sounds.   No murmur heard. Pulmonary/Chest: Effort normal and breath sounds normal. No respiratory distress. He has no wheezes.  He has no rhonchi.  Abdominal: Soft. Bowel sounds are normal. There is no tenderness.  Musculoskeletal:       Right ankle: He exhibits no swelling.       Left ankle: He exhibits no swelling.  Neurological: He is alert and oriented to person, place, and time.  Psychiatric: Mood, memory, affect and judgment normal.  Nursing note and vitals reviewed.     Assessment & Plan  1. Decrease in appetite Improving on Remeron 15 mg daily, refills provided - mirtazapine (REMERON) 15 MG tablet; Take 1 tablet (15 mg total) by mouth at bedtime.  Dispense: 90 tablet; Refill: 0  2. Chronic obstructive pulmonary disease, unspecified COPD type ([place]) Stable, follows with pulmonologist, using portable oxygen.   Collin Gonzales [facility] 12/02/2015 8:51 AM

## 2015-12-04 ENCOUNTER — Telehealth: Payer: Self-pay | Admitting: Family Medicine

## 2015-12-04 NOTE — Telephone Encounter (Signed)
Pt has an an appt on Monday 12/07/2015 to see Dr Michaelene Song @ 8:05 AM @ [facility] and pt needs a HTN referral placed. Please advise.

## 2015-12-07 DIAGNOSIS — J449 Chronic obstructive pulmonary disease, unspecified: Secondary | ICD-10-CM | POA: Diagnosis not present

## 2015-12-07 DIAGNOSIS — H25011 Cortical age-related cataract, right eye: Secondary | ICD-10-CM | POA: Diagnosis not present

## 2015-12-31 ENCOUNTER — Ambulatory Visit: Admission: RE | Admit: 2015-12-31 | Payer: Commercial Managed Care - HMO | Source: Ambulatory Visit

## 2016-01-01 DIAGNOSIS — H25011 Cortical age-related cataract, right eye: Secondary | ICD-10-CM | POA: Diagnosis not present

## 2016-01-04 ENCOUNTER — Encounter: Payer: Self-pay | Admitting: *Deleted

## 2016-01-04 ENCOUNTER — Ambulatory Visit: Payer: Commercial Managed Care - HMO | Admitting: Family Medicine

## 2016-01-04 ENCOUNTER — Encounter: Payer: Self-pay | Admitting: Family Medicine

## 2016-01-04 ENCOUNTER — Ambulatory Visit
Admission: RE | Admit: 2016-01-04 | Discharge: 2016-01-04 | Disposition: A | Discharge status: Home / Self Care | Payer: Commercial Managed Care - HMO | Source: Ambulatory Visit | Attending: Radiation Oncology | Admitting: Radiation Oncology

## 2016-01-04 DIAGNOSIS — I2510 Atherosclerotic heart disease of native coronary artery without angina pectoris: Secondary | ICD-10-CM | POA: Insufficient documentation

## 2016-01-04 DIAGNOSIS — C3411 Malignant neoplasm of upper lobe, right bronchus or lung: Secondary | ICD-10-CM | POA: Diagnosis not present

## 2016-01-04 DIAGNOSIS — R918 Other nonspecific abnormal finding of lung field: Secondary | ICD-10-CM | POA: Diagnosis not present

## 2016-01-04 DIAGNOSIS — I700 Atherosclerosis of aorta: Secondary | ICD-10-CM | POA: Diagnosis not present

## 2016-01-04 DIAGNOSIS — J439 Emphysema, unspecified: Secondary | ICD-10-CM | POA: Diagnosis not present

## 2016-01-04 MED ORDER — IOPAMIDOL (ISOVUE-300) INJECTION 61%
75.0000 mL | Freq: Once | INTRAVENOUS | Status: AC | PRN
  Administered 2016-01-04: 75 mL via INTRAVENOUS

## 2016-01-04 NOTE — Progress Notes (Signed)
This encounter was created in error - please disregard.Name: Collin Gonzales   MRN: [id number]    DOB: Apr 02, 1935   Date:01/04/2016       Progress Note  Subjective  Chief Complaint  Chief Complaint  Patient presents with  . Follow-up    1 month    HPI    Past Medical History:  Diagnosis Date  . Arthritis    rheumatoid arthritis  . Cancer ([place])   . Collagen vascular disease ([place])   . COPD (chronic obstructive pulmonary disease) ([place])   . GERD (gastroesophageal reflux disease)   . History of prostate cancer   . HOH (hard of hearing)   . Insomnia   . Lung cancer ([place])   . Oxygen deficit    2L HS AND PRN  . Stroke ([place])    2000  . Wheezing     Past Surgical History:  Procedure Laterality Date  . APPENDECTOMY  1965  . ENDOBRONCHIAL ULTRASOUND N/A 02/26/2015   Procedure: ENDOBRONCHIAL ULTRASOUND;  Surgeon: Flora Lipps, MD;  Location: [facility] ORS;  Service: Cardiopulmonary;  Laterality: N/A;  . HERNIA REPAIR  1965    Family History  Problem Relation Age of Onset  . Cancer Mother   . Cancer Father     Lung Cancer  . Cancer Sister   . Cancer Brother     Social History   Social History  . Marital status: Single    Spouse name: N/A  . Number of children: N/A  . Years of education: N/A   Occupational History  . Not on file.   Social History Main Topics  . Smoking status: Former Smoker    Quit date: 02/21/2013  . Smokeless tobacco: Never Used  . Alcohol use No  . Drug use: No  . Sexual activity: Not on file   Other Topics Concern  . Not on file   Social History Narrative  . No narrative on file     Current Outpatient Prescriptions:  .  albuterol (PROAIR HFA) 108 (90 BASE) MCG/ACT inhaler, Inhale 2 puffs into the lungs every 4 (four) hours as needed for wheezing. , Disp: , Rfl:  .  aspirin EC 81 MG tablet, Take 81 mg by mouth daily., Disp: , Rfl:  .  B Complex-C (B-COMPLEX WITH VITAMIN C) tablet, Take 1 tablet by mouth daily., Disp: , Rfl:  .   Fluticasone-Salmeterol (ADVAIR DISKUS) 250-50 MCG/DOSE AEPB, Inhale 1 puff into the lungs every morning. , Disp: , Rfl:  .  methotrexate 2.5 MG tablet, Take 20 mg by mouth every Tuesday. With Supper, Disp: , Rfl:  .  mirtazapine (REMERON) 15 MG tablet, Take 1 tablet (15 mg total) by mouth at bedtime., Disp: 90 tablet, Rfl: 0 .  omeprazole (PRILOSEC) 40 MG capsule, Take 1 capsule (40 mg total) by mouth daily., Disp: 90 capsule, Rfl: 1 .  oxyCODONE (OXY IR/ROXICODONE) 5 MG immediate release tablet, Take 1 tablet (5 mg total) by mouth every 4 (four) hours as needed for moderate pain., Disp: 30 tablet, Rfl: 0 .  tiotropium (SPIRIVA HANDIHALER) 18 MCG inhalation capsule, Place into inhaler and inhale 2 (two) times daily. , Disp: , Rfl:  .  traZODone (DESYREL) 50 MG tablet, Take 1 tablet (50 mg total) by mouth at bedtime., Disp: 90 tablet, Rfl: 1  Allergies  Allergen Reactions  . Plaquenil [Hydroxychloroquine]   . Simvastatin Other (See Comments)    Bad dreams     ROS    Objective  Vitals:  01/04/16 0954  BP: 138/70  Pulse: 71  Resp: 18  Temp: 97.5 F (36.4 C)  TempSrc: Oral  SpO2: 95%  Weight: 125 lb (56.7 kg)  Height: '5\' 8"'$  (1.727 m)    Physical Exam     No results found for this or any previous visit (from the past 2160 hour(s)).   Assessment & Plan  There are no diagnoses linked to this encounter.  [name] Asad A. [facility] 01/04/2016 10:33 AM

## 2016-01-05 ENCOUNTER — Ambulatory Visit
Admission: RE | Admit: 2016-01-05 | Discharge: 2016-01-05 | Disposition: A | Discharge status: Home / Self Care | Payer: Commercial Managed Care - HMO | Source: Ambulatory Visit | Attending: Ophthalmology | Admitting: Ophthalmology

## 2016-01-05 ENCOUNTER — Encounter
Admission: RE | Disposition: A | Discharge status: Home / Self Care | Payer: Self-pay | Source: Ambulatory Visit | Attending: Ophthalmology

## 2016-01-05 ENCOUNTER — Ambulatory Visit: Payer: Commercial Managed Care - HMO | Admitting: Anesthesiology

## 2016-01-05 DIAGNOSIS — Z87891 Personal history of nicotine dependence: Secondary | ICD-10-CM | POA: Insufficient documentation

## 2016-01-05 DIAGNOSIS — K219 Gastro-esophageal reflux disease without esophagitis: Secondary | ICD-10-CM | POA: Insufficient documentation

## 2016-01-05 DIAGNOSIS — H2511 Age-related nuclear cataract, right eye: Secondary | ICD-10-CM | POA: Insufficient documentation

## 2016-01-05 DIAGNOSIS — I10 Essential (primary) hypertension: Secondary | ICD-10-CM | POA: Diagnosis not present

## 2016-01-05 DIAGNOSIS — H25011 Cortical age-related cataract, right eye: Secondary | ICD-10-CM | POA: Diagnosis not present

## 2016-01-05 DIAGNOSIS — J45909 Unspecified asthma, uncomplicated: Secondary | ICD-10-CM | POA: Diagnosis not present

## 2016-01-05 DIAGNOSIS — J449 Chronic obstructive pulmonary disease, unspecified: Secondary | ICD-10-CM | POA: Diagnosis not present

## 2016-01-05 HISTORY — DX: Cerebral infarction, unspecified: I63.9

## 2016-01-05 HISTORY — PX: CATARACT EXTRACTION W/PHACO: SHX586

## 2016-01-05 HISTORY — DX: Unspecified hearing loss, unspecified ear: H91.90

## 2016-01-05 HISTORY — DX: Hypoxemia: R09.02

## 2016-01-05 SURGERY — PHACOEMULSIFICATION, CATARACT, WITH IOL INSERTION
Anesthesia: Monitor Anesthesia Care | Site: Eye | Laterality: Right | Wound class: Clean

## 2016-01-05 MED ORDER — FENTANYL CITRATE (PF) 100 MCG/2ML IJ SOLN
INTRAMUSCULAR | Status: DC | PRN
  Administered 2016-01-05: 50 ug via INTRAVENOUS

## 2016-01-05 MED ORDER — POVIDONE-IODINE 5 % OP SOLN
OPHTHALMIC | Status: DC | PRN
  Administered 2016-01-05: 1 via OPHTHALMIC

## 2016-01-05 MED ORDER — POVIDONE-IODINE 5 % OP SOLN
OPHTHALMIC | Status: AC
  Filled 2016-01-05: qty 30

## 2016-01-05 MED ORDER — MOXIFLOXACIN HCL 0.5 % OP SOLN
OPHTHALMIC | Status: DC | PRN
  Administered 2016-01-05: 1 [drp] via OPHTHALMIC

## 2016-01-05 MED ORDER — MOXIFLOXACIN HCL 0.5 % OP SOLN
1.0000 [drp] | OPHTHALMIC | Status: DC
Start: 2016-01-05 — End: 2016-01-05
  Administered 2016-01-05 (×3): 1 [drp] via OPHTHALMIC

## 2016-01-05 MED ORDER — EPINEPHRINE PF 1 MG/ML IJ SOLN
INTRAOCULAR | Status: DC | PRN
  Administered 2016-01-05: 1 mL via OPHTHALMIC

## 2016-01-05 MED ORDER — ARMC OPHTHALMIC DILATING DROPS
1.0000 "application " | OPHTHALMIC | Status: AC
  Administered 2016-01-05 (×3): 1 via OPHTHALMIC

## 2016-01-05 MED ORDER — MOXIFLOXACIN HCL 0.5 % OP SOLN
OPHTHALMIC | Status: AC
  Administered 2016-01-05: 1 [drp] via OPHTHALMIC
  Filled 2016-01-05: qty 3

## 2016-01-05 MED ORDER — NA CHONDROIT SULF-NA HYALURON 40-17 MG/ML IO SOLN
INTRAOCULAR | Status: AC
  Filled 2016-01-05: qty 1

## 2016-01-05 MED ORDER — EPINEPHRINE PF 1 MG/ML IJ SOLN
INTRAMUSCULAR | Status: AC
  Filled 2016-01-05: qty 2

## 2016-01-05 MED ORDER — NA CHONDROIT SULF-NA HYALURON 40-17 MG/ML IO SOLN
INTRAOCULAR | Status: AC
Start: 2016-01-05 — End: 2016-01-05
  Filled 2016-01-05: qty 1

## 2016-01-05 MED ORDER — NA CHONDROIT SULF-NA HYALURON 40-17 MG/ML IO SOLN
INTRAOCULAR | Status: DC | PRN
  Administered 2016-01-05: 1 mL via INTRAOCULAR

## 2016-01-05 MED ORDER — ARMC OPHTHALMIC DILATING DROPS
OPHTHALMIC | Status: AC
  Administered 2016-01-05: 1 via OPHTHALMIC
  Filled 2016-01-05: qty 0.4

## 2016-01-05 MED ORDER — CARBACHOL 0.01 % IO SOLN
INTRAOCULAR | Status: DC | PRN
  Administered 2016-01-05: 0.5 mL via INTRAOCULAR

## 2016-01-05 MED ORDER — MIDAZOLAM HCL 2 MG/2ML IJ SOLN
INTRAMUSCULAR | Status: DC | PRN
  Administered 2016-01-05: 1 mg via INTRAVENOUS

## 2016-01-05 MED ORDER — LIDOCAINE HCL (PF) 4 % IJ SOLN
INTRAMUSCULAR | Status: DC | PRN
  Administered 2016-01-05: 4 mL via OPHTHALMIC

## 2016-01-05 MED ORDER — LIDOCAINE HCL (PF) 4 % IJ SOLN
INTRAMUSCULAR | Status: AC
  Filled 2016-01-05: qty 5

## 2016-01-05 MED ORDER — SODIUM CHLORIDE 0.9 % IV SOLN
INTRAVENOUS | Status: DC
  Administered 2016-01-05: 07:00:00 via INTRAVENOUS

## 2016-01-05 SURGICAL SUPPLY — 21 items
CANNULA ANT/CHMB 27GA (MISCELLANEOUS) ×3 IMPLANT
CUP MEDICINE 2OZ PLAST GRAD ST (MISCELLANEOUS) ×3 IMPLANT
GLOVE BIO SURGEON STRL SZ8 (GLOVE) ×3 IMPLANT
GLOVE BIOGEL M 6.5 STRL (GLOVE) ×3 IMPLANT
GLOVE SURG LX 8.0 MICRO (GLOVE) ×2
GLOVE SURG LX STRL 8.0 MICRO (GLOVE) ×1 IMPLANT
GOWN STRL REUS W/ TWL LRG LVL3 (GOWN DISPOSABLE) ×2 IMPLANT
GOWN STRL REUS W/TWL LRG LVL3 (GOWN DISPOSABLE) ×4
LENS IOL TECNIS ITEC 20.0 (Intraocular Lens) ×3 IMPLANT
PACK CATARACT (MISCELLANEOUS) ×3 IMPLANT
PACK CATARACT BRASINGTON LX (MISCELLANEOUS) ×3 IMPLANT
PACK EYE AFTER SURG (MISCELLANEOUS) ×3 IMPLANT
SOL BSS BAG (MISCELLANEOUS) ×3
SOL PREP PVP 2OZ (MISCELLANEOUS) ×3
SOLUTION BSS BAG (MISCELLANEOUS) ×1 IMPLANT
SOLUTION PREP PVP 2OZ (MISCELLANEOUS) ×1 IMPLANT
SYR 3ML LL SCALE MARK (SYRINGE) ×3 IMPLANT
SYR 5ML LL (SYRINGE) ×3 IMPLANT
SYR TB 1ML 27GX1/2 LL (SYRINGE) ×3 IMPLANT
WATER STERILE IRR 250ML POUR (IV SOLUTION) ×3 IMPLANT
WIPE NON LINTING 3.25X3.25 (MISCELLANEOUS) ×3 IMPLANT

## 2016-01-05 NOTE — H&P (Signed)
All labs reviewed. Abnormal studies sent to patients PCP when indicated.  Previous H&P reviewed, patient examined, there are NO CHANGES.  [name]11/14/20177:52 AM

## 2016-01-05 NOTE — Discharge Instructions (Signed)
Eye Surgery Discharge Instructions  Expect mild scratchy sensation or mild soreness. DO NOT RUB YOUR EYE!  The day of surgery:  Minimal physical activity, but bed rest is not required  No reading, computer work, or close hand work  No bending, lifting, or straining.  May watch TV  For 24 hours:  No driving, legal decisions, or alcoholic beverages  Safety precautions  Eat anything you prefer: It is better to start with liquids, then soup then solid foods.  _____ Eye patch should be worn until postoperative exam tomorrow.  ____ Solar shield eyeglasses should be worn for comfort in the sunlight/patch while sleeping  Resume all regular medications including aspirin or Coumadin if these were discontinued prior to surgery. You may shower, bathe, shave, or wash your hair. Tylenol may be taken for mild discomfort.  Call your doctor if you experience significant pain, nausea, or vomiting, fever > 101 or other signs of infection. [phone] or [phone] Specific instructions:  Follow-up Information    PORFILIO,WILLIAM LOUIS, MD Follow up on 01/06/2016.   Specialty:  Ophthalmology Why:  1:55 Contact information: [address] [place] [address] [phone]

## 2016-01-05 NOTE — Transfer of Care (Signed)
Immediate Anesthesia Transfer of Care Note  Patient: Collin Gonzales  Procedure(s) Performed: Procedure(s) with comments: CATARACT EXTRACTION PHACO AND INTRAOCULAR LENS PLACEMENT (IOC) (Right) - [place] 1.14 AP% 18.1 CDE 13.44 FLUID PACK LOT # [id number]  Patient Location: PACU and Short Stay  Anesthesia Type:MAC  Level of Consciousness: patient cooperative and lethargic  Airway & Oxygen Therapy: Patient Spontanous Breathing  Post-op Assessment: Report given to RN and Post -op Vital signs reviewed and stable  Post vital signs: Reviewed and stable  Last Vitals:  Vitals:   01/05/16 0645 01/05/16 0829  BP: (!) 169/97 (!) 156/72  Pulse: 77 65  Resp: 16 18  Temp: 36.4 C 36.4 C    Last Pain:  Vitals:   01/05/16 0829  TempSrc: Tympanic         Complications: No apparent anesthesia complications

## 2016-01-05 NOTE — Op Note (Signed)
PREOPERATIVE DIAGNOSIS:  Nuclear sclerotic cataract of the right eye.   POSTOPERATIVE DIAGNOSIS:  NUCLEAR SCLEROTIC CATARACT RIGHT EYE   OPERATIVE PROCEDURE: Procedure(s): CATARACT EXTRACTION PHACO AND INTRAOCULAR LENS PLACEMENT (IOC)   SURGEON:  [name], MD.   ANESTHESIA:  Anesthesiologist: Molli Barrows, MD CRNA: Jonna Clark, CRNA  1.      Managed anesthesia care. 2.      0.30m of Shugarcaine was instilled in the eye following the paracentesis.   COMPLICATIONS:  None.   TECHNIQUE:   Stop and chop   DESCRIPTION OF PROCEDURE:  The patient was examined and consented in the preoperative holding area where the aforementioned topical anesthesia was applied to the right eye and then brought back to the Operating Room where the right eye was prepped and draped in the usual sterile ophthalmic fashion and a lid speculum was placed. A paracentesis was created with the side port blade and the anterior chamber was filled with viscoelastic. A near clear corneal incision was performed with the steel keratome. A continuous curvilinear capsulorrhexis was performed with a cystotome followed by the capsulorrhexis forceps. Hydrodissection and hydrodelineation were carried out with BSS on a blunt cannula. The lens was removed in a stop and chop  technique and the remaining cortical material was removed with the irrigation-aspiration handpiece. The capsular bag was inflated with viscoelastic and the Technis [id number]  lens was placed in the capsular bag without complication. The remaining viscoelastic was removed from the eye with the irrigation-aspiration handpiece. The wounds were hydrated. The anterior chamber was flushed with Miostat and the eye was inflated to physiologic pressure. 0.168mof Vigamox was placed in the anterior chamber. The wounds were found to be water tight. The eye was dressed with Vigamox. The patient was given protective glasses to wear throughout the day and a shield with which to  sleep tonight. The patient was also given drops with which to begin a drop regimen today and will follow-up with me in one day.  Implant Name Type Inv. Item Serial No. Manufacturer Lot No. LRB No. Used  LENS IOL DIOP 20.0 - [id number] Intraocular Lens LENS IOL DIOP 20.0 [id number]   Right 1   Procedure(s) with comments: CATARACT EXTRACTION PHACO AND INTRAOCULAR LENS PLACEMENT (IOC) (Right) - [place].14 AP% 18.1 CDE 13.44 FLUID PACK LOT # [id number]  Electronically signed: POViborg1/14/2017 8:27 AM

## 2016-01-05 NOTE — Anesthesia Postprocedure Evaluation (Signed)
Anesthesia Post Note  Patient: ARAVIND CHRISMER  Procedure(s) Performed: Procedure(s) (LRB): CATARACT EXTRACTION PHACO AND INTRAOCULAR LENS PLACEMENT (IOC) (Right)  Patient location during evaluation: Short Stay Anesthesia Type: MAC Level of consciousness: awake and oriented Pain management: pain level controlled Vital Signs Assessment: post-procedure vital signs reviewed and stable Respiratory status: spontaneous breathing Cardiovascular status: stable Postop Assessment: no headache Anesthetic complications: no    Last Vitals:  Vitals:   01/05/16 0645 01/05/16 0829  BP: (!) 169/97 (!) 156/72  Pulse: 77 64  Resp: 16 16  Temp: 36.4 C 36.6 C    Last Pain:  Vitals:   01/05/16 0829  TempSrc: Burnett Corrente

## 2016-01-05 NOTE — Anesthesia Preprocedure Evaluation (Signed)
Anesthesia Evaluation  Patient identified by MRN, date of birth, ID band Patient awake    Reviewed: Allergy & Precautions, H&P , NPO status , Patient's Chart, lab work & pertinent test results, reviewed documented beta blocker date and time   Airway Mallampati: II  TM Distance: >3 FB Neck ROM: full    Dental no notable dental hx. (+) Teeth Intact, Poor Dentition   Pulmonary neg pulmonary ROS, COPD, former smoker,    Pulmonary exam normal breath sounds clear to auscultation       Cardiovascular Exercise Tolerance: Good hypertension, negative cardio ROS   Rhythm:regular Rate:Normal     Neuro/Psych CVA negative neurological ROS  negative psych ROS   GI/Hepatic negative GI ROS, Neg liver ROS, GERD  ,  Endo/Other  negative endocrine ROSdiabetes  Renal/GU      Musculoskeletal   Abdominal   Peds  Hematology negative hematology ROS (+)   Anesthesia Other Findings   Reproductive/Obstetrics negative OB ROS                             Anesthesia Physical Anesthesia Plan  ASA: IV  Anesthesia Plan: MAC   Post-op Pain Management:    Induction:   Airway Management Planned:   Additional Equipment:   Intra-op Plan:   Post-operative Plan:   Informed Consent: I have reviewed the patients History and Physical, chart, labs and discussed the procedure including the risks, benefits and alternatives for the proposed anesthesia with the patient or authorized representative who has indicated his/her understanding and acceptance.     Plan Discussed with: CRNA  Anesthesia Plan Comments:         Anesthesia Quick Evaluation

## 2016-01-06 LAB — POCT I-STAT CREATININE: Creatinine, Ser: 1 mg/dL (ref 0.61–1.24)

## 2016-01-07 ENCOUNTER — Encounter: Payer: Self-pay | Admitting: Radiation Oncology

## 2016-01-07 ENCOUNTER — Ambulatory Visit
Admission: RE | Admit: 2016-01-07 | Discharge: 2016-01-07 | Disposition: A | Discharge status: Home / Self Care | Payer: Commercial Managed Care - HMO | Source: Ambulatory Visit | Attending: Radiation Oncology | Admitting: Radiation Oncology

## 2016-01-07 ENCOUNTER — Other Ambulatory Visit: Payer: Self-pay | Admitting: *Deleted

## 2016-01-07 VITALS — BP 122/73 | HR 83 | Temp 96.8°F | Wt 127.2 lb

## 2016-01-07 DIAGNOSIS — Z87891 Personal history of nicotine dependence: Secondary | ICD-10-CM | POA: Insufficient documentation

## 2016-01-07 DIAGNOSIS — Z923 Personal history of irradiation: Secondary | ICD-10-CM | POA: Diagnosis not present

## 2016-01-07 DIAGNOSIS — C3411 Malignant neoplasm of upper lobe, right bronchus or lung: Secondary | ICD-10-CM | POA: Diagnosis not present

## 2016-01-07 DIAGNOSIS — J449 Chronic obstructive pulmonary disease, unspecified: Secondary | ICD-10-CM | POA: Diagnosis not present

## 2016-01-07 DIAGNOSIS — R918 Other nonspecific abnormal finding of lung field: Secondary | ICD-10-CM

## 2016-01-07 NOTE — Progress Notes (Signed)
Radiation Oncology Follow up Note  Name: Collin Gonzales   Date:   01/07/2016 MRN:  [id number] DOB: [birth year]    This 80 y.o. male presents to the clinic today for four-month follow-up status post SB RT to right upper lobe for non-small cell lung cancer.  REFERRING PROVIDER: Roselee Nova, MD  HPI: Patient is a 80 year old male now out 4 months having completed SB RT to his right upper lobe for non-small cell lung cancer. Seen today in routine follow-up he is doing well. He specifically denies any dysphagia cough hemoptysis or chest tightness. He recently has had cataract surgery is recuperating well from that.Marland Kitchen He recently had a CT scan showing slight decrease in the speculated nodule in the apical segment of the right upper lobe consistent with treatment response.  COMPLICATIONS OF TREATMENT: none  FOLLOW UP COMPLIANCE: keeps appointments   PHYSICAL EXAM:  BP 122/73   Pulse 83   Temp (!) 96.8 F (36 C)   Wt 127 lb 3.3 oz (57.7 kg)   BMI 19.34 kg/m  Well-developed well-nourished patient in NAD. HEENT reveals PERLA, EOMI, discs not visualized.  Oral cavity is clear. No oral mucosal lesions are identified. Neck is clear without evidence of cervical or supraclavicular adenopathy. Lungs are clear to A&P. Cardiac examination is essentially unremarkable with regular rate and rhythm without murmur rub or thrill. Abdomen is benign with no organomegaly or masses noted. Motor sensory and DTR levels are equal and symmetric in the upper and lower extremities. Cranial nerves II through XII are grossly intact. Proprioception is intact. No peripheral adenopathy or edema is identified. No motor or sensory levels are noted. Crude visual fields are within normal range.  RADIOLOGY RESULTS: Most recent CT scan from this week reviewed and compatible above-stated findings showing excellent response to radiation.  PLAN: At this time I've asked to see him back in 6 months for follow-up will obtain a CT  scan at that time prior to visit. Otherwise he continues to do quite well. I'm please was overall progress. Patient knows to call sooner with any concerns.  I would like to take this opportunity to thank you for allowing me to participate in the care of your patient.Armstead Peaks., MD

## 2016-01-08 ENCOUNTER — Encounter: Payer: Commercial Managed Care - HMO | Admitting: Family Medicine

## 2016-01-11 ENCOUNTER — Ambulatory Visit (INDEPENDENT_AMBULATORY_CARE_PROVIDER_SITE_OTHER): Payer: Medicare HMO | Admitting: Family Medicine

## 2016-01-11 VITALS — BP 122/68 | HR 84 | Temp 97.2°F | Ht 67.0 in | Wt 127.1 lb

## 2016-01-11 DIAGNOSIS — Z0000 Encounter for general adult medical examination without abnormal findings: Secondary | ICD-10-CM | POA: Diagnosis not present

## 2016-01-11 DIAGNOSIS — Z23 Encounter for immunization: Secondary | ICD-10-CM | POA: Diagnosis not present

## 2016-01-11 NOTE — Progress Notes (Signed)
Subjective:   Collin Gonzales is a 80 y.o. male who presents for Medicare Annual/Subsequent preventive examination.  Review of Systems:  N/A  Cardiac Risk Factors include: advanced age (>1mn, >>16women);dyslipidemia;male gender     Objective:    Vitals: BP 122/68 (BP Location: Right Arm)   Pulse 84   Temp 97.2 F (36.2 C) (Oral)   Ht '5\' 7"'$  (1.702 m)   Wt 127 lb 2 oz (57.7 kg)   BMI 19.91 kg/m   Body mass index is 19.91 kg/m.  Tobacco History  Smoking Status  . Former Smoker  . Quit date: 02/21/2013  Smokeless Tobacco  . Never Used     Counseling given: Not Answered   Past Medical History:  Diagnosis Date  . Arthritis    rheumatoid arthritis  . Cancer ([place]   . Collagen vascular disease ([place]   . COPD (chronic obstructive pulmonary disease) ([place]   . GERD (gastroesophageal reflux disease)   . History of prostate cancer   . HOH (hard of hearing)   . Insomnia   . Lung cancer ([place]   . Oxygen deficit    2L HS AND PRN  . Stroke ([place]    2000  . Wheezing    Past Surgical History:  Procedure Laterality Date  . APPENDECTOMY  1965  . CATARACT EXTRACTION W/PHACO Right 01/05/2016   Procedure: CATARACT EXTRACTION PHACO AND INTRAOCULAR LENS PLACEMENT (IOC);  Surgeon: WBirder Robson MD;  Location: [facility] ORS;  Service: Ophthalmology;  Laterality: Right;  [place]1.[place] AP% 18.1 CDE 13.44 FLUID PACK LOT # [id number]  . ENDOBRONCHIAL ULTRASOUND N/A 02/26/2015   Procedure: ENDOBRONCHIAL ULTRASOUND;  Surgeon: KFlora Lipps MD;  Location: [facility] ORS;  Service: Cardiopulmonary;  Laterality: N/A;  . HERNIA REPAIR  1965   Family History  Problem Relation Age of Onset  . Cancer Mother   . Cancer Father     Lung Cancer  . Cancer Sister   . Cancer Brother    History  Sexual Activity  . Sexual activity: Not on file    Outpatient Encounter Prescriptions as of 01/11/2016  Medication Sig  . albuterol (PROAIR HFA) 108 (90 BASE) MCG/ACT inhaler Inhale 2 puffs into the lungs  every 4 (four) hours as needed for wheezing.   .Marland Kitchenaspirin EC 81 MG tablet Take 81 mg by mouth daily.  . B Complex-C (B-COMPLEX WITH VITAMIN C) tablet Take 1 tablet by mouth daily.  . Fluticasone-Salmeterol (ADVAIR DISKUS) 250-50 MCG/DOSE AEPB Inhale 1 puff into the lungs every morning.   . methotrexate 2.5 MG tablet Take 20 mg by mouth every Tuesday. With Supper  . mirtazapine (REMERON) 15 MG tablet Take 1 tablet (15 mg total) by mouth at bedtime. (Patient not taking: Reported on 01/05/2016)  . omeprazole (PRILOSEC) 40 MG capsule Take 1 capsule (40 mg total) by mouth daily.  .Marland KitchenoxyCODONE (OXY IR/ROXICODONE) 5 MG immediate release tablet Take 1 tablet (5 mg total) by mouth every 4 (four) hours as needed for moderate pain.  .Marland Kitchentiotropium (SPIRIVA HANDIHALER) 18 MCG inhalation capsule Place into inhaler and inhale 2 (two) times daily.   . traZODone (DESYREL) 50 MG tablet Take 1 tablet (50 mg total) by mouth at bedtime.   No facility-administered encounter medications on file as of 01/11/2016.     Activities of Daily Living In your present state of health, do you have any difficulty performing the following activities: 01/11/2016 12/02/2015  Hearing? YTempie Donning Vision? N Y  Difficulty concentrating or  making decisions? N N  Walking or climbing stairs? Y Y  Dressing or bathing? N N  Doing errands, shopping? N N  Preparing Food and eating ? N -  Using the Toilet? N -  In the past six months, have you accidently leaked urine? Y -  Do you have problems with loss of bowel control? N -  Managing your Medications? N -  Managing your Finances? N -  Housekeeping or managing your Housekeeping? N -  Some recent data might be hidden    Patient Care Team: Roselee Nova, MD as PCP - General (Family Medicine) [name], MD as Referring Physician (Ophthalmology) Julieanne Manson Leeanne Mannan., MD as Consulting Physician (Rheumatology) [name], MD as Referring Physician (Specialist)   Assessment:      Exercise Activities and Dietary recommendations Current Exercise Habits: The patient does not participate in regular exercise at present, Exercise limited by: respiratory conditions(s)  Goals    . Increase water intake          Starting 01/11/16, I will continue to drink 6 glasses of water a day.      Fall Risk Fall Risk  01/11/2016 12/02/2015 09/17/2015 09/01/2015 08/05/2015  Falls in the past year? Yes No No No No  Number falls in past yr: 1 - - - -  Injury with Fall? No - No No -   Depression Screen PHQ 2/9 Scores 01/11/2016 12/02/2015 09/17/2015 09/01/2015  PHQ - 2 Score 0 0 0 0    Cognitive Function     6CIT Screen 01/11/2016  What Year? 0 points  What month? 0 points  What time? 0 points  Count back from 20 0 points  Months in reverse 0 points  Repeat phrase 4 points  Total Score 4    Immunization History  Administered Date(s) Administered  . Influenza, High Dose Seasonal PF 11/21/2014, 01/11/2016  . Pneumococcal Polysaccharide-23 12/06/2012   Screening Tests Health Maintenance  Topic Date Due  . PNA vac Low Risk Adult (1 of 2 - PCV13) 01/10/2017 (Originally 12/28/2002)  . ZOSTAVAX  01/10/2026 (Originally 12/27/1997)  . TETANUS/TDAP  01/10/2026 (Originally 12/27/1956)  . INFLUENZA VACCINE  Completed      Plan:  I have personally reviewed and addressed the Medicare Annual Wellness questionnaire and have noted the following in the patient's chart:  A. Medical and social history B. Use of alcohol, tobacco or illicit drugs  C. Current medications and supplements D. Functional ability and status E.  Nutritional status F.  Physical activity G. Advance directives H. List of other physicians I.  Hospitalizations, surgeries, and ER visits in previous 12 months J.  [place] such as hearing and vision if needed, cognitive and depression L. Referrals and appointments - none  In addition, I have reviewed and discussed with patient certain preventive  protocols, quality metrics, and best practice recommendations. A written personalized care plan for preventive services as well as general preventive health recommendations were provided to patient.  See attached scanned questionnaire for additional information.   Signed,  Fabio Neighbors, LPN Nurse Health Advisor I, as supervising physician, have reviewed the nurse health advisor's Medicare Wellness Visit note for this patient and concur with the findings and recommendations listed above.  Signed Syed Asad A. Manuella Ghazi MD Attending Physician.

## 2016-01-11 NOTE — Patient Instructions (Signed)
Collin Gonzales , Thank you for taking time to come for your Medicare Wellness Visit. I appreciate your ongoing commitment to your health goals. Please review the following plan we discussed and let me know if I can assist you in the future.   These are the goals we discussed: Goals    . Increase water intake          Starting 01/11/16, I will continue to drink 6 glasses of water a day.       This is a list of the screening recommended for you and due dates:  Health Maintenance  Topic Date Due  . Pneumonia vaccines (1 of 2 - PCV13) 01/10/2017*  . Shingles Vaccine  01/10/2026*  . Tetanus Vaccine  01/10/2026*  . Flu Shot  Completed  *Topic was postponed. The date shown is not the original due date.   Preventive Care for Adults  A healthy lifestyle and preventive care can promote health and wellness. Preventive health guidelines for adults include the following key practices.  . A routine yearly physical is a good way to check with your health care provider about your health and preventive screening. It is a chance to share any concerns and updates on your health and to receive a thorough exam.  . Visit your dentist for a routine exam and preventive care every 6 months. Brush your teeth twice a day and floss once a day. Good oral hygiene prevents tooth decay and gum disease.  . The frequency of eye exams is based on your age, health, family medical history, use  of contact lenses, and other factors. Follow your health care provider's ecommendations for frequency of eye exams.  . Eat a healthy diet. Foods like vegetables, fruits, whole grains, low-fat dairy products, and lean protein foods contain the nutrients you need without too many calories. Decrease your intake of foods high in solid fats, added sugars, and salt. Eat the right amount of calories for you. Get information about a proper diet from your health care provider, if necessary.  . Regular physical exercise is one of the most  important things you can do for your health. Most adults should get at least 150 minutes of moderate-intensity exercise (any activity that increases your heart rate and causes you to sweat) each week. In addition, most adults need muscle-strengthening exercises on 2 or more days a week.  Silver Sneakers may be a benefit available to you. To determine eligibility, you may visit the website: www.silversneakers.com or contact program at [phone]) [phone] Mon-Fri between 8AM-8PM.   . Maintain a healthy weight. The body mass index (BMI) is a screening tool to identify possible weight problems. It provides an estimate of body fat based on height and weight. Your health care provider can find your BMI and can help you achieve or maintain a healthy weight.   For adults 20 years and older: ? A BMI below 18.5 is considered underweight. ? A BMI of 18.5 to 24.9 is normal. ? A BMI of 25 to 29.9 is considered overweight. ? A BMI of 30 and above is considered obese.   . Maintain normal blood lipids and cholesterol levels by exercising and minimizing your intake of saturated fat. Eat a balanced diet with plenty of fruit and vegetables. Blood tests for lipids and cholesterol should begin at age 89 and be repeated every 5 years. If your lipid or cholesterol levels are high, you are over 50, or you are at high risk for heart disease, you may  need your cholesterol levels checked more frequently. Ongoing high lipid and cholesterol levels should be treated with medicines if diet and exercise are not working.  . If you smoke, find out from your health care provider how to quit. If you do not use tobacco, please do not start.  . If you choose to drink alcohol, please do not consume more than 2 drinks per day. One drink is considered to be 12 ounces (355 mL) of beer, 5 ounces (148 mL) of wine, or 1.5 ounces (44 mL) of liquor.  . If you are 65-43 years old, ask your health care provider if you should take aspirin to prevent  strokes.  . Use sunscreen. Apply sunscreen liberally and repeatedly throughout the day. You should seek shade when your shadow is shorter than you. Protect yourself by wearing long sleeves, pants, a wide-brimmed hat, and sunglasses year round, whenever you are outdoors.  . Once a month, do a whole body skin exam, using a mirror to look at the skin on your back. Tell your health care provider of new moles, moles that have irregular borders, moles that are larger than a pencil eraser, or moles that have changed in shape or color.

## 2016-01-30 ENCOUNTER — Other Ambulatory Visit: Payer: Self-pay | Admitting: Family Medicine

## 2016-01-30 DIAGNOSIS — F5104 Psychophysiologic insomnia: Secondary | ICD-10-CM

## 2016-02-02 NOTE — Telephone Encounter (Signed)
Pt states his is completely out of trazadone. Please send to pharmacy.

## 2016-02-02 NOTE — Telephone Encounter (Signed)
Pt called again about refill.

## 2016-02-06 DIAGNOSIS — J449 Chronic obstructive pulmonary disease, unspecified: Secondary | ICD-10-CM | POA: Diagnosis not present

## 2016-02-11 DIAGNOSIS — Z961 Presence of intraocular lens: Secondary | ICD-10-CM | POA: Diagnosis not present

## 2016-02-29 ENCOUNTER — Ambulatory Visit (INDEPENDENT_AMBULATORY_CARE_PROVIDER_SITE_OTHER): Payer: Commercial Managed Care - HMO | Admitting: Family Medicine

## 2016-02-29 ENCOUNTER — Encounter: Payer: Self-pay | Admitting: Family Medicine

## 2016-02-29 DIAGNOSIS — F5104 Psychophysiologic insomnia: Secondary | ICD-10-CM | POA: Diagnosis not present

## 2016-02-29 DIAGNOSIS — R05 Cough: Secondary | ICD-10-CM | POA: Diagnosis not present

## 2016-02-29 DIAGNOSIS — R058 Other specified cough: Secondary | ICD-10-CM

## 2016-02-29 MED ORDER — GUAIFENESIN-CODEINE 100-10 MG/5ML PO SYRP: 10.0000 mL | ORAL_SOLUTION | Freq: Four times a day (QID) | ORAL | 0 refills | Status: DC | PRN

## 2016-02-29 MED ORDER — TRAZODONE HCL 50 MG PO TABS: 50.0000 mg | ORAL_TABLET | Freq: Every day | ORAL | 1 refills | Status: DC

## 2016-02-29 MED ORDER — PREDNISONE 10 MG (21) PO TBPK: 10.0000 mg | ORAL_TABLET | Freq: Every day | ORAL | 0 refills | Status: DC

## 2016-02-29 NOTE — Progress Notes (Signed)
Name: Collin Gonzales   MRN: [id number]    DOB: November 26, 1935   Date:02/29/2016       Progress Note  Subjective  Chief Complaint  Chief Complaint  Patient presents with  . URI    onset 4 days no fever or aches, just cough, congestion,sob and chest tighness    Cough  This is a new problem. The current episode started in the past 7 days. The problem has been unchanged. The cough is productive of sputum. Associated symptoms include chills and a fever. Pertinent negatives include no nasal congestion, postnasal drip, shortness of breath or wheezing. Associated symptoms comments: Had fevers and chills 3 days ago, better since yesterday. He has tried a beta-agonist inhaler (Alka-Seltzer plus cold and cough) for the symptoms. His past medical history is significant for COPD.    Past Medical History:  Diagnosis Date  . Arthritis    rheumatoid arthritis  . Cancer ([place])   . Collagen vascular disease ([place])   . COPD (chronic obstructive pulmonary disease) ([place])   . GERD (gastroesophageal reflux disease)   . History of prostate cancer   . HOH (hard of hearing)   . Insomnia   . Lung cancer ([place])   . Oxygen deficit    2L HS AND PRN  . Stroke ([place])    2000  . Wheezing     Past Surgical History:  Procedure Laterality Date  . APPENDECTOMY  1965  . CATARACT EXTRACTION W/PHACO Right 01/05/2016   Procedure: CATARACT EXTRACTION PHACO AND INTRAOCULAR LENS PLACEMENT (IOC);  Surgeon: [name], MD;  Location: [facility] ORS;  Service: Ophthalmology;  Laterality: Right;  [place] 1.14 AP% 18.1 CDE 13.44 FLUID PACK LOT # [id number] H  . ENDOBRONCHIAL ULTRASOUND N/A 02/26/2015   Procedure: ENDOBRONCHIAL ULTRASOUND;  Surgeon: Flora Lipps, MD;  Location: [facility] ORS;  Service: Cardiopulmonary;  Laterality: N/A;  . HERNIA REPAIR  1965    Family History  Problem Relation Age of Onset  . Cancer Mother   . Cancer Father     Lung Cancer  . Cancer Sister   . Cancer Brother     Social History   Social History   . Marital status: Single    Spouse name: N/A  . Number of children: N/A  . Years of education: N/A   Occupational History  . Not on file.   Social History Main Topics  . Smoking status: Former Smoker    Quit date: 02/21/2013  . Smokeless tobacco: Never Used  . Alcohol use No  . Drug use: No  . Sexual activity: Not on file   Other Topics Concern  . Not on file   Social History Narrative  . No narrative on file     Current Outpatient Prescriptions:  .  albuterol (PROAIR HFA) 108 (90 BASE) MCG/ACT inhaler, Inhale 2 puffs into the lungs every 4 (four) hours as needed for wheezing. , Disp: , Rfl:  .  aspirin EC 81 MG tablet, Take 81 mg by mouth daily., Disp: , Rfl:  .  B Complex-C (B-COMPLEX WITH VITAMIN C) tablet, Take 1 tablet by mouth daily., Disp: , Rfl:  .  Fluticasone-Salmeterol (ADVAIR DISKUS) 250-50 MCG/DOSE AEPB, Inhale 1 puff into the lungs every morning. , Disp: , Rfl:  .  methotrexate 2.5 MG tablet, Take 20 mg by mouth every Tuesday. With Supper, Disp: , Rfl:  .  mirtazapine (REMERON) 15 MG tablet, Take 1 tablet (15 mg total) by mouth at bedtime. (Patient not taking: Reported on 01/05/2016),  Disp: 90 tablet, Rfl: 0 .  omeprazole (PRILOSEC) 40 MG capsule, Take 1 capsule (40 mg total) by mouth daily., Disp: 90 capsule, Rfl: 1 .  tiotropium (SPIRIVA HANDIHALER) 18 MCG inhalation capsule, Place into inhaler and inhale 2 (two) times daily. , Disp: , Rfl:  .  traZODone (DESYREL) 50 MG tablet, TAKE 1 TABLET (50 MG TOTAL) BY MOUTH AT BEDTIME., Disp: 90 tablet, Rfl: 1  Allergies  Allergen Reactions  . Plaquenil [Hydroxychloroquine]   . Simvastatin Other (See Comments)    Bad dreams     Review of Systems  Constitutional: Positive for chills and fever.  HENT: Negative for postnasal drip.   Respiratory: Positive for cough. Negative for shortness of breath and wheezing.     Objective  Vitals:   02/29/16 1518  BP: 104/60  Pulse: 86  Resp: 18  Temp: 98.3 F (36.8 C)   TempSrc: Oral  SpO2: 99%  Weight: 129 lb 4.8 oz (58.7 kg)    Physical Exam  Constitutional: He is well-developed, well-nourished, and in no distress.  HENT:  Right Ear: Tympanic membrane and ear canal normal.  Left Ear: Ear canal normal.  Nose: Right sinus exhibits no maxillary sinus tenderness and no frontal sinus tenderness. Left sinus exhibits no maxillary sinus tenderness and no frontal sinus tenderness.  Mouth/Throat: Oropharynx is clear and moist. No oropharyngeal exudate, posterior oropharyngeal edema or posterior oropharyngeal erythema.  Cardiovascular: Normal rate, regular rhythm, S1 normal, S2 normal and normal heart sounds.   No murmur heard. Pulmonary/Chest: Effort normal. No accessory muscle usage. No respiratory distress. He has decreased breath sounds. He has no wheezes. He has no rhonchi. He has no rales.  Nursing note and vitals reviewed.     Assessment & Plan  1. Productive cough Likely bronchitis versus COPD exacerbation. Obtain chest x-ray, consider starting on antimicrobial therapy. - predniSONE (STERAPRED UNI-PAK 21 TAB) 10 MG (21) TBPK tablet; Take 1 tablet (10 mg total) by mouth daily. 60 50 40 '30 20 10 '$ then STOP  Dispense: 21 tablet; Refill: 0 - guaiFENesin-codeine (ROBITUSSIN AC) 100-10 MG/5ML syrup; Take 10 mLs by mouth 4 (four) times daily as needed for cough.  Dispense: 250 mL; Refill: 0 - DG Chest 2 View; Future  2. Chronic insomnia  - traZODone (DESYREL) 50 MG tablet; Take 1 tablet (50 mg total) by mouth at bedtime.  Dispense: 90 tablet; Refill: 1   Breahna Boylen [facility] 02/29/2016 3:27 PM

## 2016-03-08 DIAGNOSIS — J449 Chronic obstructive pulmonary disease, unspecified: Secondary | ICD-10-CM | POA: Diagnosis not present

## 2016-04-06 ENCOUNTER — Encounter: Payer: Self-pay | Admitting: Family Medicine

## 2016-04-06 ENCOUNTER — Ambulatory Visit (INDEPENDENT_AMBULATORY_CARE_PROVIDER_SITE_OTHER): Payer: Medicare HMO | Admitting: Family Medicine

## 2016-04-06 VITALS — BP 112/75 | HR 100 | Temp 97.9°F | Resp 18 | Ht 67.0 in | Wt 116.4 lb

## 2016-04-06 DIAGNOSIS — R058 Other specified cough: Secondary | ICD-10-CM

## 2016-04-06 DIAGNOSIS — R05 Cough: Secondary | ICD-10-CM | POA: Diagnosis not present

## 2016-04-06 NOTE — Progress Notes (Signed)
Name: ANTWON ROCHIN   MRN: [id number]    DOB: 06/19/1935   Date:04/06/2016       Progress Note  Subjective  Chief Complaint  Chief Complaint  Patient presents with  . Nasal Congestion    x3 days    HPI  Pt. Presents for 3 days hx of chest congestion, gradual onset with symptoms starting 5 days ago, he has COPD but has not noticed an increase in cough above baseline, no fever chills, or sore throat. However, he has noticed producing more mucus than usual. He has not taken any medication for treatment of congestion.   Past Medical History:  Diagnosis Date  . Arthritis    rheumatoid arthritis  . Cancer ([place])   . Collagen vascular disease ([place])   . COPD (chronic obstructive pulmonary disease) ([place])   . GERD (gastroesophageal reflux disease)   . History of prostate cancer   . HOH (hard of hearing)   . Insomnia   . Lung cancer ([place])   . Oxygen deficit    2L HS AND PRN  . Stroke ([place])    2000  . Wheezing     Past Surgical History:  Procedure Laterality Date  . APPENDECTOMY  1965  . CATARACT EXTRACTION W/PHACO Right 01/05/2016   Procedure: CATARACT EXTRACTION PHACO AND INTRAOCULAR LENS PLACEMENT (IOC);  Surgeon: [name], MD;  Location: [facility];  Service: Ophthalmology;  Laterality: Right;  [place] 1.14 AP% 18.1 CDE 13.44 FLUID PACK LOT # [id number] H  . ENDOBRONCHIAL ULTRASOUND N/A 02/26/2015   Procedure: ENDOBRONCHIAL ULTRASOUND;  Surgeon: Flora Lipps, MD;  Location: [facility];  Service: Cardiopulmonary;  Laterality: N/A;  . HERNIA REPAIR  1965    Family History  Problem Relation Age of Onset  . Cancer Mother   . Cancer Father     Lung Cancer  . Cancer Sister   . Cancer Brother     Social History   Social History  . Marital status: Single    Spouse name: N/A  . Number of children: N/A  . Years of education: N/A   Occupational History  . Not on file.   Social History Main Topics  . Smoking status: Former Smoker    Quit date: 02/21/2013  . Smokeless  tobacco: Never Used  . Alcohol use No  . Drug use: No  . Sexual activity: Not on file   Other Topics Concern  . Not on file   Social History Narrative  . No narrative on file     Current Outpatient Prescriptions:  .  albuterol (PROAIR HFA) 108 (90 BASE) MCG/ACT inhaler, Inhale 2 puffs into the lungs every 4 (four) hours as needed for wheezing. , Disp: , Rfl:  .  aspirin EC 81 MG tablet, Take 81 mg by mouth daily., Disp: , Rfl:  .  B Complex-C (B-COMPLEX WITH VITAMIN C) tablet, Take 1 tablet by mouth daily., Disp: , Rfl:  .  Fluticasone-Salmeterol (ADVAIR DISKUS) 250-50 MCG/DOSE AEPB, Inhale 1 puff into the lungs every morning. , Disp: , Rfl:  .  guaiFENesin-codeine (ROBITUSSIN AC) 100-10 MG/5ML syrup, Take 10 mLs by mouth 4 (four) times daily as needed for cough., Disp: 250 mL, Rfl: 0 .  methotrexate 2.5 MG tablet, Take 20 mg by mouth every Tuesday. With Supper, Disp: , Rfl:  .  mirtazapine (REMERON) 15 MG tablet, Take 1 tablet (15 mg total) by mouth at bedtime., Disp: 90 tablet, Rfl: 0 .  omeprazole (PRILOSEC) 40 MG capsule, Take 1 capsule (40  mg total) by mouth daily., Disp: 90 capsule, Rfl: 1 .  predniSONE (STERAPRED UNI-PAK 21 TAB) 10 MG (21) TBPK tablet, Take 1 tablet (10 mg total) by mouth daily. 60 50 40 '30 20 10 '$ then STOP, Disp: 21 tablet, Rfl: 0 .  tiotropium (SPIRIVA HANDIHALER) 18 MCG inhalation capsule, Place into inhaler and inhale 2 (two) times daily. , Disp: , Rfl:  .  traZODone (DESYREL) 50 MG tablet, Take 1 tablet (50 mg total) by mouth at bedtime., Disp: 90 tablet, Rfl: 1  Allergies  Allergen Reactions  . Plaquenil [Hydroxychloroquine]   . Simvastatin Other (See Comments)    Bad dreams     Review of Systems  Constitutional: Negative for chills, fever and malaise/fatigue.  Respiratory: Positive for cough, sputum production and wheezing.   Cardiovascular: Negative for chest pain.    Objective  Vitals:   04/06/16 1458  BP: 112/75  Pulse: 100  Resp: 18   Temp: 97.9 F (36.6 C)  TempSrc: Oral  SpO2: 93%  Weight: 116 lb 6.4 oz (52.8 kg)  Height: '5\' 7"'$  (1.702 m)    Physical Exam  Constitutional: He is well-developed, well-nourished, and in no distress.  HENT:  Head: Normocephalic and atraumatic.  Nose: Right sinus exhibits no maxillary sinus tenderness and no frontal sinus tenderness. Left sinus exhibits no maxillary sinus tenderness and no frontal sinus tenderness.  Mouth/Throat: Posterior oropharyngeal erythema present.  Cardiovascular: Normal rate, regular rhythm and normal heart sounds.   Pulmonary/Chest: Effort normal. No respiratory distress. He has wheezes in the right lower field and the left lower field. He has rales.  Nursing note and vitals reviewed.     Assessment & Plan  1. Productive cough Unlikely to be COPD exacerbation based on symptoms and presentation. No dyspnea on exertion at rest, compliant with COPD medications. Obtain chest x-ray to rule out pneumonia. - DG Chest 2 View; Future   Emmajane Altamura [facility] 04/06/2016 3:03 PM

## 2016-04-07 DIAGNOSIS — N359 Urethral stricture, unspecified: Secondary | ICD-10-CM | POA: Diagnosis not present

## 2016-04-07 DIAGNOSIS — R3914 Feeling of incomplete bladder emptying: Secondary | ICD-10-CM | POA: Diagnosis not present

## 2016-04-07 DIAGNOSIS — R3916 Straining to void: Secondary | ICD-10-CM | POA: Diagnosis not present

## 2016-04-07 DIAGNOSIS — D400 Neoplasm of uncertain behavior of prostate: Secondary | ICD-10-CM | POA: Diagnosis not present

## 2016-04-07 DIAGNOSIS — R350 Frequency of micturition: Secondary | ICD-10-CM | POA: Diagnosis not present

## 2016-04-07 DIAGNOSIS — C61 Malignant neoplasm of prostate: Secondary | ICD-10-CM | POA: Diagnosis not present

## 2016-04-07 DIAGNOSIS — R351 Nocturia: Secondary | ICD-10-CM | POA: Diagnosis not present

## 2016-04-07 DIAGNOSIS — R3915 Urgency of urination: Secondary | ICD-10-CM | POA: Diagnosis not present

## 2016-04-08 ENCOUNTER — Ambulatory Visit
Admission: RE | Admit: 2016-04-08 | Discharge: 2016-04-08 | Disposition: A | Discharge status: Home / Self Care | Payer: Medicare HMO | Source: Ambulatory Visit | Attending: Family Medicine | Admitting: Family Medicine

## 2016-04-08 DIAGNOSIS — J449 Chronic obstructive pulmonary disease, unspecified: Secondary | ICD-10-CM | POA: Diagnosis not present

## 2016-04-08 DIAGNOSIS — R0602 Shortness of breath: Secondary | ICD-10-CM | POA: Diagnosis not present

## 2016-04-08 DIAGNOSIS — R05 Cough: Secondary | ICD-10-CM | POA: Insufficient documentation

## 2016-04-08 DIAGNOSIS — J984 Other disorders of lung: Secondary | ICD-10-CM | POA: Insufficient documentation

## 2016-04-08 DIAGNOSIS — Z8709 Personal history of other diseases of the respiratory system: Secondary | ICD-10-CM | POA: Diagnosis present

## 2016-04-08 DIAGNOSIS — R062 Wheezing: Secondary | ICD-10-CM | POA: Diagnosis not present

## 2016-04-08 DIAGNOSIS — R058 Other specified cough: Secondary | ICD-10-CM

## 2016-04-11 ENCOUNTER — Other Ambulatory Visit: Payer: Self-pay | Admitting: Emergency Medicine

## 2016-04-11 ENCOUNTER — Telehealth: Payer: Self-pay | Admitting: Family Medicine

## 2016-04-11 NOTE — Telephone Encounter (Signed)
Checking status on test results

## 2016-04-12 NOTE — Telephone Encounter (Signed)
Patient has been notified of lab results  

## 2016-04-13 ENCOUNTER — Telehealth: Payer: Self-pay

## 2016-04-13 NOTE — Telephone Encounter (Signed)
I call Dr. Olena Leatherwood office to see if we should proceed with the CT chest w/ contrast or is it ok to wait until June, but there was no answer. A message was left for them to give [place] a call when they got the chance.

## 2016-04-15 ENCOUNTER — Telehealth: Payer: Self-pay

## 2016-04-15 ENCOUNTER — Encounter: Payer: Self-pay | Admitting: *Deleted

## 2016-04-15 DIAGNOSIS — R05 Cough: Secondary | ICD-10-CM

## 2016-04-15 DIAGNOSIS — R058 Other specified cough: Secondary | ICD-10-CM

## 2016-04-15 MED ORDER — AZITHROMYCIN 250 MG PO TABS: ORAL_TABLET | ORAL | 0 refills | Status: DC

## 2016-04-15 NOTE — Telephone Encounter (Signed)
-----   Message from Roselee Nova, MD sent at 04/14/2016  5:56 PM EST ----- Faythe Ghee, please call and inform patient of their recommendations. Thank you  ----- Message ----- From: Dennard Schaumann, CMA Sent: 04/13/2016   2:12 PM To: Roselee Nova, MD  I got a call back from Dr. Olena Leatherwood office and he said it is ok to wait until June to have the CT scan.  Previous message: I call Dr. Olena Leatherwood office to see if we should proceed with the CT chest w/ contrast or is it ok to wait until June, but there was no answer. A message was left for them to give [place] a call when they got the chance.  Electronically signed by Dennard Schaumann, CMA at 04/13/2016 2:06 PM

## 2016-04-15 NOTE — Telephone Encounter (Signed)
Patient still has significant coughing with mucus production, no nausea noted on the chest x-ray but apical scarring with recommendation for CT scan of chest. Patient follows with a pulmonologist and is aware of the plan to order CT scan of chest in June 2018. We'll call in azithromycin 250 mg to his pharmacy.

## 2016-04-15 NOTE — Telephone Encounter (Signed)
Patient was informed.  Patient stated that he has had a terrible cough and wanted to know if something could be called in for him.  Patient uses [facility] in [place].

## 2016-04-15 NOTE — Addendum Note (Signed)
Addended byManuella Ghazi, Eilish Mcdaniel A A on: 04/15/2016 04:02 PM   Modules accepted: Orders

## 2016-05-04 DIAGNOSIS — M0579 Rheumatoid arthritis with rheumatoid factor of multiple sites without organ or systems involvement: Secondary | ICD-10-CM | POA: Diagnosis not present

## 2016-05-04 DIAGNOSIS — R700 Elevated erythrocyte sedimentation rate: Secondary | ICD-10-CM | POA: Diagnosis not present

## 2016-05-04 DIAGNOSIS — R938 Abnormal findings on diagnostic imaging of other specified body structures: Secondary | ICD-10-CM | POA: Diagnosis not present

## 2016-05-06 DIAGNOSIS — J449 Chronic obstructive pulmonary disease, unspecified: Secondary | ICD-10-CM | POA: Diagnosis not present

## 2016-05-30 ENCOUNTER — Encounter: Payer: Self-pay | Admitting: Family Medicine

## 2016-05-30 ENCOUNTER — Ambulatory Visit (INDEPENDENT_AMBULATORY_CARE_PROVIDER_SITE_OTHER): Payer: Medicare HMO | Admitting: Family Medicine

## 2016-05-30 VITALS — BP 120/64 | HR 90 | Temp 97.8°F | Resp 18 | Ht 67.0 in | Wt 123.3 lb

## 2016-05-30 DIAGNOSIS — E785 Hyperlipidemia, unspecified: Secondary | ICD-10-CM | POA: Diagnosis not present

## 2016-05-30 LAB — LIPID PANEL
CHOLESTEROL: 167 mg/dL (ref <200)
HDL: 55 mg/dL (ref >40)
LDL Cholesterol: 99 mg/dL (ref <100)
TRIGLYCERIDES: 65 mg/dL (ref <150)
Total CHOL/HDL Ratio: 3 Ratio (ref <5.0)
VLDL: 13 mg/dL (ref <30)

## 2016-05-30 NOTE — Progress Notes (Signed)
Name: Collin Gonzales   MRN: [id number]    DOB: [birth year]   Date:05/30/2016       Progress Note  Subjective  Chief Complaint  Chief Complaint  Patient presents with  . Follow-up    3 month recheck    Hyperlipidemia  This is a chronic problem. The problem is controlled. Recent lipid tests were reviewed and are normal. He is currently on no antihyperlipidemic treatment (was prescribed Rosuvastatin but had side effects including abnormal dreams and was discontinued.).     Past Medical History:  Diagnosis Date  . Arthritis    rheumatoid arthritis  . Cancer ([place])   . Collagen vascular disease ([place])   . COPD (chronic obstructive pulmonary disease) ([place])   . GERD (gastroesophageal reflux disease)   . History of prostate cancer   . HOH (hard of hearing)   . Insomnia   . Lung cancer ([place])   . Oxygen deficit    2L HS AND PRN  . Stroke ([place])    2000  . Wheezing     Past Surgical History:  Procedure Laterality Date  . APPENDECTOMY  1965  . CATARACT EXTRACTION W/PHACO Right 01/05/2016   Procedure: CATARACT EXTRACTION PHACO AND INTRAOCULAR LENS PLACEMENT (IOC);  Surgeon: [name], MD;  Location: [facility];  Service: Ophthalmology;  Laterality: Right;  [place] 1.14 AP% 18.1 CDE 13.44 FLUID PACK LOT # [id number] H  . ENDOBRONCHIAL ULTRASOUND N/A 02/26/2015   Procedure: ENDOBRONCHIAL ULTRASOUND;  Surgeon: Flora Lipps, MD;  Location: [facility] OR[facility];  Service: Cardiopulmonary;  Laterality: N/A;  . HERNIA REPAIR  1965    Family History  Problem Relation Age of Onset  . Cancer Mother   . Cancer Father     Lung Cancer  . Cancer Sister   . Cancer Brother     Social History   Social History  . Marital status: Single    Spouse name: N/A  . Number of children: N/A  . Years of education: N/A   Occupational History  . Not on file.   Social History Main Topics  . Smoking status: Former Smoker    Quit date: 02/21/2013  . Smokeless tobacco: Never Used  . Alcohol use No  . Drug use:  No  . Sexual activity: Not on file   Other Topics Concern  . Not on file   Social History Narrative  . No narrative on file     Current Outpatient Prescriptions:  .  albuterol (PROAIR HFA) 108 (90 BASE) MCG/ACT inhaler, Inhale 2 puffs into the lungs every 4 (four) hours as needed for wheezing. , Disp: , Rfl:  .  aspirin EC 81 MG tablet, Take 81 mg by mouth daily., Disp: , Rfl:  .  B Complex-C (B-COMPLEX WITH VITAMIN C) tablet, Take 1 tablet by mouth daily., Disp: , Rfl:  .  Fluticasone-Salmeterol (ADVAIR DISKUS) 250-50 MCG/DOSE AEPB, Inhale 1 puff into the lungs every morning. , Disp: , Rfl:  .  methotrexate 2.5 MG tablet, Take 20 mg by mouth every Tuesday. With Supper, Disp: , Rfl:  .  mirtazapine (REMERON) 15 MG tablet, Take 1 tablet (15 mg total) by mouth at bedtime., Disp: 90 tablet, Rfl: 0 .  omeprazole (PRILOSEC) 40 MG capsule, Take 1 capsule (40 mg total) by mouth daily., Disp: 90 capsule, Rfl: 1 .  tiotropium (SPIRIVA HANDIHALER) 18 MCG inhalation capsule, Place into inhaler and inhale 2 (two) times daily. , Disp: , Rfl:  .  traZODone (DESYREL) 50 MG tablet, Take 1  tablet (50 mg total) by mouth at bedtime., Disp: 90 tablet, Rfl: 1  Allergies  Allergen Reactions  . Plaquenil [Hydroxychloroquine]   . Simvastatin Other (See Comments)    Bad dreams     ROS  Please see history of present illness for complete discussion of ROS  Objective  Vitals:   05/30/16 1021  BP: 120/64  Pulse: 90  Resp: 18  Temp: 97.8 F (36.6 C)  TempSrc: Oral  SpO2: 93%  Weight: 123 lb 4.8 oz (55.9 kg)  Height: '5\' 7"'$  (1.702 m)    Physical Exam  Constitutional: He is oriented to person, place, and time and well-developed, well-nourished, and in no distress.  HENT:  Head: Normocephalic and atraumatic.  Cardiovascular: Normal rate, regular rhythm, S1 normal, S2 normal and normal heart sounds.   No murmur heard. Pulmonary/Chest: Effort normal and breath sounds normal. No respiratory  distress. He has no wheezes. He has no rhonchi.  Abdominal: Soft. Bowel sounds are normal. There is no tenderness.  Musculoskeletal:       Right ankle: He exhibits no swelling.       Left ankle: He exhibits no swelling.  Neurological: He is alert and oriented to person, place, and time.  Psychiatric: Mood, memory, affect and judgment normal.  Nursing note and vitals reviewed.      Assessment & Plan  1. Dyslipidemia Obtain FLP, consider restarting on a different statin - Lipid panel   Zerek Litsey [facility] 05/30/2016 10:43 AM

## 2016-06-06 ENCOUNTER — Encounter: Payer: Self-pay | Admitting: Family Medicine

## 2016-06-06 ENCOUNTER — Ambulatory Visit (INDEPENDENT_AMBULATORY_CARE_PROVIDER_SITE_OTHER): Payer: Medicare HMO | Admitting: Family Medicine

## 2016-06-06 VITALS — BP 120/70 | HR 83 | Temp 98.1°F | Resp 16 | Ht 67.0 in | Wt 124.9 lb

## 2016-06-06 DIAGNOSIS — J449 Chronic obstructive pulmonary disease, unspecified: Secondary | ICD-10-CM | POA: Diagnosis not present

## 2016-06-06 DIAGNOSIS — Z9189 Other specified personal risk factors, not elsewhere classified: Secondary | ICD-10-CM

## 2016-06-06 DIAGNOSIS — I6930 Unspecified sequelae of cerebral infarction: Secondary | ICD-10-CM | POA: Diagnosis not present

## 2016-06-06 MED ORDER — ATORVASTATIN CALCIUM 10 MG PO TABS: 10.0000 mg | ORAL_TABLET | Freq: Every day | ORAL | 0 refills | Status: DC

## 2016-06-06 NOTE — Progress Notes (Signed)
Name: Collin Gonzales   MRN: [id number]    DOB: 08/17/1935   Date:06/06/2016       Progress Note  Subjective  Chief Complaint  Chief Complaint  Patient presents with  . Follow-up    statin therapy    HPI  Pt. Presents for consideration of lipid lowering therapy, his Framingham score for CV disease is 15%, has history of previous stroke, he has taken statins (rosuvastatin and simvastatin) in the past but could not tolerate due to side effects.    Past Medical History:  Diagnosis Date  . Arthritis    rheumatoid arthritis  . Cancer ([place])   . Collagen vascular disease ([place])   . COPD (chronic obstructive pulmonary disease) ([place])   . GERD (gastroesophageal reflux disease)   . History of prostate cancer   . HOH (hard of hearing)   . Insomnia   . Lung cancer ([place])   . Oxygen deficit    2L HS AND PRN  . Stroke ([place])    2000  . Wheezing     Past Surgical History:  Procedure Laterality Date  . APPENDECTOMY  1965  . CATARACT EXTRACTION W/PHACO Right 01/05/2016   Procedure: CATARACT EXTRACTION PHACO AND INTRAOCULAR LENS PLACEMENT (IOC);  Surgeon: [name], MD;  Location: [facility];  Service: Ophthalmology;  Laterality: Right;  [place] 1.14 AP% 18.1 CDE 13.44 FLUID PACK LOT # [id number] H  . ENDOBRONCHIAL ULTRASOUND N/A 02/26/2015   Procedure: ENDOBRONCHIAL ULTRASOUND;  Surgeon: Flora Lipps, MD;  Location: [facility];  Service: Cardiopulmonary;  Laterality: N/A;  . HERNIA REPAIR  1965    Family History  Problem Relation Age of Onset  . Cancer Mother   . Cancer Father     Lung Cancer  . Cancer Sister   . Cancer Brother     Social History   Social History  . Marital status: Single    Spouse name: N/A  . Number of children: N/A  . Years of education: N/A   Occupational History  . Not on file.   Social History Main Topics  . Smoking status: Former Smoker    Quit date: 02/21/2013  . Smokeless tobacco: Never Used  . Alcohol use No  . Drug use: No  . Sexual activity:  Not on file   Other Topics Concern  . Not on file   Social History Narrative  . No narrative on file     Current Outpatient Prescriptions:  .  albuterol (PROAIR HFA) 108 (90 BASE) MCG/ACT inhaler, Inhale 2 puffs into the lungs every 4 (four) hours as needed for wheezing. , Disp: , Rfl:  .  aspirin EC 81 MG tablet, Take 81 mg by mouth daily., Disp: , Rfl:  .  B Complex-C (B-COMPLEX WITH VITAMIN C) tablet, Take 1 tablet by mouth daily., Disp: , Rfl:  .  Fluticasone-Salmeterol (ADVAIR DISKUS) 250-50 MCG/DOSE AEPB, Inhale 1 puff into the lungs every morning. , Disp: , Rfl:  .  methotrexate 2.5 MG tablet, Take 20 mg by mouth every Tuesday. With Supper, Disp: , Rfl:  .  mirtazapine (REMERON) 15 MG tablet, Take 1 tablet (15 mg total) by mouth at bedtime., Disp: 90 tablet, Rfl: 0 .  omeprazole (PRILOSEC) 40 MG capsule, Take 1 capsule (40 mg total) by mouth daily., Disp: 90 capsule, Rfl: 1 .  tiotropium (SPIRIVA HANDIHALER) 18 MCG inhalation capsule, Place into inhaler and inhale 2 (two) times daily. , Disp: , Rfl:  .  traZODone (DESYREL) 50 MG tablet, Take 1  tablet (50 mg total) by mouth at bedtime., Disp: 90 tablet, Rfl: 1  Allergies  Allergen Reactions  . Plaquenil [Hydroxychloroquine]   . Simvastatin Other (See Comments)    Bad dreams     ROS  Please see history of present illness for complete discussion of ROS  Objective  Vitals:   06/06/16 1016  BP: 120/70  Pulse: 83  Resp: 16  Temp: 98.1 F (36.7 C)  TempSrc: Oral  SpO2: 93%  Weight: 124 lb 14.4 oz (56.7 kg)  Height: '5\' 7"'$  (1.702 m)    Physical Exam  Constitutional: He is oriented to person, place, and time and well-developed, well-nourished, and in no distress.  Cardiovascular: Normal rate, regular rhythm and normal heart sounds.   No murmur heard. Pulmonary/Chest:  mildly decreased breath sounds globally, no wheezing, rhonchi  Neurological: He is alert and oriented to person, place, and time.  Psychiatric: Mood,  memory, affect and judgment normal.  Nursing note and vitals reviewed.    Recent Results (from the past 2160 hour(s))  Lipid panel     Status: None   Collection Time: 05/30/16 11:28 AM  Result Value Ref Range   Cholesterol 167 <200 mg/dL   Triglycerides 65 <150 mg/dL   HDL 55 >40 mg/dL   Total CHOL/HDL Ratio 3.0 <5.0 Ratio   VLDL 13 <30 mg/dL   LDL Cholesterol 99 <100 mg/dL     Assessment & Plan  1. History of stroke with residual effects Takes baby aspirin 81 mg daily  2. Candidate for statin therapy due to risk of future cardiovascular event Start on atorvastatin 10 mg at bedtime, recheck FLP in 3-4 months - atorvastatin (LIPITOR) 10 MG tablet; Take 1 tablet (10 mg total) by mouth daily at 6 PM.  Dispense: 90 tablet; Refill: 0   Meera Vasco [facility] 06/06/2016 10:50 AM

## 2016-07-06 DIAGNOSIS — J449 Chronic obstructive pulmonary disease, unspecified: Secondary | ICD-10-CM | POA: Diagnosis not present

## 2016-07-25 ENCOUNTER — Ambulatory Visit: Admission: RE | Admit: 2016-07-25 | Payer: Medicare HMO | Source: Ambulatory Visit

## 2016-07-25 ENCOUNTER — Ambulatory Visit
Admission: RE | Admit: 2016-07-25 | Discharge: 2016-07-25 | Disposition: A | Discharge status: Home / Self Care | Payer: Medicare HMO | Source: Ambulatory Visit | Attending: Radiation Oncology | Admitting: Radiation Oncology

## 2016-07-25 DIAGNOSIS — I2510 Atherosclerotic heart disease of native coronary artery without angina pectoris: Secondary | ICD-10-CM | POA: Insufficient documentation

## 2016-07-25 DIAGNOSIS — J181 Lobar pneumonia, unspecified organism: Secondary | ICD-10-CM | POA: Insufficient documentation

## 2016-07-25 DIAGNOSIS — I700 Atherosclerosis of aorta: Secondary | ICD-10-CM | POA: Insufficient documentation

## 2016-07-25 DIAGNOSIS — R918 Other nonspecific abnormal finding of lung field: Secondary | ICD-10-CM | POA: Diagnosis not present

## 2016-07-25 DIAGNOSIS — J439 Emphysema, unspecified: Secondary | ICD-10-CM | POA: Diagnosis not present

## 2016-07-25 DIAGNOSIS — C3411 Malignant neoplasm of upper lobe, right bronchus or lung: Secondary | ICD-10-CM | POA: Insufficient documentation

## 2016-07-25 LAB — POCT I-STAT CREATININE: Creatinine, Ser: 1.1 mg/dL (ref 0.61–1.24)

## 2016-07-25 MED ORDER — IOPAMIDOL (ISOVUE-300) INJECTION 61%
75.0000 mL | Freq: Once | INTRAVENOUS | Status: AC | PRN
  Administered 2016-07-25: 75 mL via INTRAVENOUS

## 2016-07-27 ENCOUNTER — Encounter: Payer: Self-pay | Admitting: Radiation Oncology

## 2016-07-27 ENCOUNTER — Other Ambulatory Visit: Payer: Self-pay | Admitting: *Deleted

## 2016-07-27 ENCOUNTER — Ambulatory Visit
Admission: RE | Admit: 2016-07-27 | Discharge: 2016-07-27 | Disposition: A | Discharge status: Home / Self Care | Payer: Medicare HMO | Source: Ambulatory Visit | Attending: Radiation Oncology | Admitting: Radiation Oncology

## 2016-07-27 VITALS — BP 126/74 | HR 89 | Temp 97.3°F | Resp 20 | Wt 122.9 lb

## 2016-07-27 DIAGNOSIS — R918 Other nonspecific abnormal finding of lung field: Secondary | ICD-10-CM | POA: Diagnosis not present

## 2016-07-27 DIAGNOSIS — Z87891 Personal history of nicotine dependence: Secondary | ICD-10-CM | POA: Insufficient documentation

## 2016-07-27 DIAGNOSIS — C3411 Malignant neoplasm of upper lobe, right bronchus or lung: Secondary | ICD-10-CM | POA: Insufficient documentation

## 2016-07-27 DIAGNOSIS — Z923 Personal history of irradiation: Secondary | ICD-10-CM | POA: Diagnosis not present

## 2016-07-27 NOTE — Progress Notes (Signed)
Radiation Oncology Follow up Note  Name: Collin Gonzales   Date:   07/27/2016 MRN:  [id number] DOB: 09-24-1935    This 81 y.o. male presents to the clinic today for 10 month follow-up status post SB RT to right upper lobe for non-small cell lung cancer.  REFERRING PROVIDER: Roselee Nova, MD  HPI: Patient is a 81 year old male now out 10 months having completed SB RT to his right upper lobe for non-small cell lung cancer. Seen today in routine follow-up he is doing well. Specifically denies cough hemoptysis or chest tightness. He's noticed no change in his pulmonary status. He recently had a. CT scan showing mild post radiation change the site of posterior apical right upper lobe lesion with no residual discrete measurable nodule found. He did have some debris filling his of right middle lobe bronchus. Suggestion for follow-up surveillance was made.  COMPLICATIONS OF TREATMENT: none  FOLLOW UP COMPLIANCE: keeps appointments   PHYSICAL EXAM:  BP 126/74   Pulse 89   Temp 97.3 F (36.3 C)   Resp 20   Wt 122 lb 14.5 oz (55.7 kg)   BMI 19.25 kg/m  Well-developed well-nourished patient in NAD. HEENT reveals PERLA, EOMI, discs not visualized.  Oral cavity is clear. No oral mucosal lesions are identified. Neck is clear without evidence of cervical or supraclavicular adenopathy. Lungs are clear to A&P. Cardiac examination is essentially unremarkable with regular rate and rhythm without murmur rub or thrill. Abdomen is benign with no organomegaly or masses noted. Motor sensory and DTR levels are equal and symmetric in the upper and lower extremities. Cranial nerves II through XII are grossly intact. Proprioception is intact. No peripheral adenopathy or edema is identified. No motor or sensory levels are noted. Crude visual fields are within normal range.  RADIOLOGY RESULTS: CT scans are reviewed and compatible with the above-stated findings  PLAN: Present time patient is doing well with  excellent response to SB RT treatment. I've asked to see him back in 1 year for follow-up. Will obtain a CT scan of his chest prior to that visit. Patient knows to call sooner with any concerns.  I would like to take this opportunity to thank you for allowing me to participate in the care of your patient.Armstead Peaks., MD

## 2016-07-29 ENCOUNTER — Ambulatory Visit (INDEPENDENT_AMBULATORY_CARE_PROVIDER_SITE_OTHER): Payer: Medicare HMO | Admitting: Family Medicine

## 2016-07-29 ENCOUNTER — Encounter: Payer: Self-pay | Admitting: Family Medicine

## 2016-07-29 ENCOUNTER — Telehealth: Payer: Self-pay | Admitting: Family Medicine

## 2016-07-29 ENCOUNTER — Other Ambulatory Visit
Admission: RE | Admit: 2016-07-29 | Discharge: 2016-07-29 | Disposition: A | Discharge status: Home / Self Care | Payer: Medicare HMO | Source: Ambulatory Visit | Attending: Family Medicine | Admitting: Family Medicine

## 2016-07-29 VITALS — BP 118/74 | HR 86 | Temp 98.3°F | Resp 18 | Ht 67.0 in | Wt 121.8 lb

## 2016-07-29 DIAGNOSIS — R197 Diarrhea, unspecified: Secondary | ICD-10-CM

## 2016-07-29 DIAGNOSIS — K921 Melena: Secondary | ICD-10-CM | POA: Insufficient documentation

## 2016-07-29 DIAGNOSIS — F5104 Psychophysiologic insomnia: Secondary | ICD-10-CM

## 2016-07-29 LAB — CBC WITH DIFFERENTIAL/PLATELET
BASOS ABS: 0 10*3/uL (ref 0–0.1)
Basophils Relative: 0 %
Eosinophils Absolute: 0.1 10*3/uL (ref 0–0.7)
Eosinophils Relative: 4 %
HEMATOCRIT: 33.9 % — AB (ref 40.0–52.0)
Hemoglobin: 11.2 g/dL — ABNORMAL LOW (ref 13.0–18.0)
Lymphocytes Relative: 20 %
Lymphs Abs: 0.7 10*3/uL — ABNORMAL LOW (ref 1.0–3.6)
MCH: 32.2 pg (ref 26.0–34.0)
MCHC: 32.9 g/dL (ref 32.0–36.0)
MCV: 97.6 fL (ref 80.0–100.0)
MONO ABS: 0.1 10*3/uL — AB (ref 0.2–1.0)
Monocytes Relative: 3 %
NEUTROS ABS: 2.4 10*3/uL (ref 1.4–6.5)
Neutrophils Relative %: 73 %
Platelets: 167 10*3/uL (ref 150–440)
RBC: 3.47 MIL/uL — ABNORMAL LOW (ref 4.40–5.90)
RDW: 16.7 % — AB (ref 11.5–14.5)
WBC: 3.4 10*3/uL — ABNORMAL LOW (ref 3.8–10.6)

## 2016-07-29 LAB — COMPREHENSIVE METABOLIC PANEL
ALT: 13 U/L — ABNORMAL LOW (ref 17–63)
AST: 22 U/L (ref 15–41)
Albumin: 3.2 g/dL — ABNORMAL LOW (ref 3.5–5.0)
Alkaline Phosphatase: 76 U/L (ref 38–126)
Anion gap: 8 (ref 5–15)
BILIRUBIN TOTAL: 0.5 mg/dL (ref 0.3–1.2)
BUN: 20 mg/dL (ref 6–20)
CALCIUM: 9.1 mg/dL (ref 8.9–10.3)
CO2: 30 mmol/L (ref 22–32)
Chloride: 101 mmol/L (ref 101–111)
Creatinine, Ser: 1.06 mg/dL (ref 0.61–1.24)
GFR calc Af Amer: >60 mL/min (ref >60)
Glucose, Bld: 90 mg/dL (ref 65–99)
POTASSIUM: 3.7 mmol/L (ref 3.5–5.1)
Sodium: 139 mmol/L (ref 135–145)
TOTAL PROTEIN: 8.6 g/dL — AB (ref 6.5–8.1)

## 2016-07-29 LAB — POC HEMOCCULT BLD/STL (OFFICE/1-CARD/DIAGNOSTIC): Fecal Occult Blood, POC: POSITIVE — AB

## 2016-07-29 MED ORDER — TRAZODONE HCL 50 MG PO TABS: 50.0000 mg | ORAL_TABLET | Freq: Every day | ORAL | 0 refills | Status: DC

## 2016-07-29 NOTE — Telephone Encounter (Signed)
Made appt for Monday June 11th

## 2016-07-29 NOTE — Telephone Encounter (Signed)
Please call patient to move his Dr. Manuella Ghazi follow-up (s/c for 08/30/2016) up for sometime this week (08/01/2016 - 08/08/2016) to address ongoing anemia. Thank you!

## 2016-07-29 NOTE — Patient Instructions (Addendum)
GO TO [place] TO HAVE YOUR LABS DRAWN TODAY. Please monitor your symptoms, if you continue to have diarrhea, see more blood in your stool, or are getting lightheaded, please go to the ER. Diarrhea, Adult Diarrhea is when you have loose and water poop (stool) often. Diarrhea can make you feel weak and cause you to get dehydrated. Dehydration can make you tired and thirsty, make you have a dry mouth, and make it so you pee (urinate) less often. Diarrhea often lasts 2-3 days. However, it can last longer if it is a sign of something more serious. It is important to treat your diarrhea as told by your doctor. Follow these instructions at home: Eating and drinking  Follow these recommendations as told by your doctor:  Take an oral rehydration solution (ORS). This is a drink that is sold at pharmacies and stores.  Drink clear fluids, such as: ? Water. ? Ice chips. ? Diluted fruit juice. ? Low-calorie sports drinks.  Eat bland, easy-to-digest foods in small amounts as you are able. These foods include: ? Bananas. ? Applesauce. ? Rice. ? Low-fat (lean) meats. ? Toast. ? Crackers.  Avoid drinking fluids that have a lot of sugar or caffeine in them.  Avoid alcohol.  Avoid spicy or fatty foods.  General instructions   Drink enough fluid to keep your pee (urine) clear or pale yellow.  Wash your hands often. If you cannot use soap and water, use hand sanitizer.  Make sure that all people in your home wash their hands well and often.  Take over-the-counter and prescription medicines only as told by your doctor.  Rest at home while you get better.  Watch your condition for any changes.  Take a warm bath to help with any burning or pain from having diarrhea.  Keep all follow-up visits as told by your doctor. This is important. Contact a doctor if:  You have a fever.  Your diarrhea gets worse.  You have new symptoms.  You cannot keep fluids down.  You feel light-headed  or dizzy.  You have a headache.  You have muscle cramps. Get help right away if:  You have chest pain.  You feel very weak or you pass out (faint).  You have bloody or black poop or poop that look like tar.  You have very bad pain, cramping, or bloating in your belly (abdomen).  You have trouble breathing or you are breathing very quickly.  Your heart is beating very quickly.  Your skin feels cold and clammy.  You feel confused.  You have signs of dehydration, such as: ? Dark pee, hardly any pee, or no pee. ? Cracked lips. ? Dry mouth. ? Sunken eyes. ? Sleepiness. ? Weakness. This information is not intended to replace advice given to you by your health care provider. Make sure you discuss any questions you have with your health care provider. Document Released: 07/27/2007 Document Revised: 08/28/2015 Document Reviewed: 10/14/2014 Elsevier Interactive Patient Education  2018 [employer].

## 2016-07-29 NOTE — Progress Notes (Addendum)
Name: Collin Gonzales   MRN: [id number]    DOB: 01-19-1936   Date:07/29/2016       Progress Note  Subjective  Chief Complaint  Chief Complaint  Patient presents with  . Diarrhea     on yesterday, better today    HPI  Diarrhea: Pt reports new onset diarrhea yesterday x4 episodes, 1 episode of small amount of vomiting yesterday with nausea too.  Today he had 1 episode of diarrhea but no more nausea. He reports feeling better today but wanted to be "checked out for it". No recent sick contacts, no abdominal pain, chest pain, shortness of breath, no fevers/chills, lightheadedness or near-syncope. Endorses hematochezia yesterday x2 while having diarrhea.  Has history of colitis hospital admission one year ago.  COPD: PT has COPD and is on 2L [place] PRN at home. He left his O2 in the car today and SpO2 initially was 85%. Pt placed on 2L [place] during his visit and O2 stayed at 90-95%.  He had Chest CT on 07/25/2016 and was told to have a repeat in 1 year - sees Dr. Baruch Gouty.  Chronic Insomnia: Sleeps about 3-6 hours of sleep a night while taking Trazodone. Pt has trouble staying asleep - states he goes to the bathroom a lot at night sometimes (Sees urologist annually).   Usually goes to bed around 12 and watches TV before bed.   Patient Active Problem List   Diagnosis Date Noted  . History of stroke with residual effects 06/06/2016  . Productive cough 02/29/2016  . History of prostate cancer 09/07/2015  . Acute colitis 09/01/2015  . Bloody diarrhea 09/01/2015  . Decrease in appetite 09/01/2015  . Colitis 08/18/2015  . GERD (gastroesophageal reflux disease) 08/05/2015  . Adenopathy   . Candidate for statin therapy due to risk of future cardiovascular event 12/08/2014  . Elevated serum globulin level 12/08/2014  . Annual physical exam 11/21/2014  . COPD (chronic obstructive pulmonary disease) ([place]) 08/20/2014  . Dyslipidemia 08/20/2014  . Acid reflux 08/20/2014  . Cerebrovascular accident, old  08/20/2014  . Personal history of malignant neoplasm of prostate 08/20/2014  . Chronic insomnia 08/20/2014  . Lung mass 08/20/2014  . Arthritis or polyarthritis, rheumatoid ([place]) 08/20/2014    Social History  Substance Use Topics  . Smoking status: Former Smoker    Quit date: 02/21/2013  . Smokeless tobacco: Never Used  . Alcohol use No     Current Outpatient Prescriptions:  .  albuterol (PROAIR HFA) 108 (90 BASE) MCG/ACT inhaler, Inhale 2 puffs into the lungs every 4 (four) hours as needed for wheezing. , Disp: , Rfl:  .  aspirin EC 81 MG tablet, Take 81 mg by mouth daily., Disp: , Rfl:  .  atorvastatin (LIPITOR) 10 MG tablet, Take 1 tablet (10 mg total) by mouth daily at 6 PM., Disp: 90 tablet, Rfl: 0 .  B Complex-C (B-COMPLEX WITH VITAMIN C) tablet, Take 1 tablet by mouth daily., Disp: , Rfl:  .  Fluticasone-Salmeterol (ADVAIR DISKUS) 250-50 MCG/DOSE AEPB, Inhale 1 puff into the lungs every morning. , Disp: , Rfl:  .  methotrexate 2.5 MG tablet, Take 20 mg by mouth every Tuesday. With Supper, Disp: , Rfl:  .  mirtazapine (REMERON) 15 MG tablet, Take 1 tablet (15 mg total) by mouth at bedtime., Disp: 90 tablet, Rfl: 0 .  omeprazole (PRILOSEC) 40 MG capsule, Take 1 capsule (40 mg total) by mouth daily., Disp: 90 capsule, Rfl: 1 .  tiotropium (SPIRIVA HANDIHALER) 18 MCG  inhalation capsule, Place into inhaler and inhale 2 (two) times daily. , Disp: , Rfl:  .  traZODone (DESYREL) 50 MG tablet, Take 1 tablet (50 mg total) by mouth at bedtime., Disp: 90 tablet, Rfl: 1  Allergies  Allergen Reactions  . Plaquenil [Hydroxychloroquine]   . Simvastatin Other (See Comments)    Bad dreams    ROS  Ten systems reviewed and is negative except as mentioned in HPI  Objective  Vitals:   07/29/16 1335  BP: 118/74  Pulse: 86  Resp: 18  Temp: 98.3 F (36.8 C)  TempSrc: Oral  SpO2: 91%  Weight: 121 lb 12.8 oz (55.2 kg)  Height: 5\' 7"  (1.702 m)    Body mass index is 19.08  kg/m.  Nursing Note and Vital Signs reviewed.  Physical Exam  Constitutional: Patient appears well-developed and well-nourished. Thin No distress.  HEENT: head atraumatic, normocephalic Cardiovascular: Normal rate, regular rhythm, S1/S2 present.  No murmur or rub heard. No BLE edema. Pulmonary/Chest: Effort normal and breath sounds clear. No respiratory distress or retractions. Abdominal: Soft and non-tender, bowel sounds present x4 quadrants. Psychiatric: Patient has a normal mood and affect. behavior is normal. Judgment and thought content normal. RECTAL: Prostate enlarged and normal consistency, no rectal masses or hemorrhoids. Hemoccult Positive.   Recent Results (from the past 2160 hour(s))  Lipid panel     Status: None   Collection Time: 05/30/16 11:28 AM  Result Value Ref Range   Cholesterol 167 <200 mg/dL   Triglycerides 65 <150 mg/dL   HDL 55 >40 mg/dL   Total CHOL/HDL Ratio 3.0 <5.0 Ratio   VLDL 13 <30 mg/dL   LDL Cholesterol 99 <100 mg/dL  I-STAT creatinine     Status: None   Collection Time: 07/25/16 10:02 AM  Result Value Ref Range   Creatinine, Ser 1.10 0.61 - 1.24 mg/dL    Assessment & Plan 1. Diarrhea, unspecified type  - POCT Occult Blood Stool - CBC w/Diff/Platelet - Comprehensive metabolic panel  2. Hematochezia  - POCT Occult Blood Stool - CBC w/Diff/Platelet  3. Chronic insomnia  - traZODone (DESYREL) 50 MG tablet; Take 1 tablet (50 mg total) by mouth at bedtime.  Dispense: 90 tablet; Refill: 0   - Advised patient to go directly to [facility] to have labs drawn and that if CBC shows anemia worse than his baseline or elevated WBC we will call to place him on antibiotics and/or send him to the ER. If placed on antibiotics, he will hold Methotrexate while taking antibiotics.  He states does not want to go to ER unless absolutely necessary.  - Red flags as below - pt will present to ER over the weekend if any worsening or new symptoms.  -Red flags and  when to present for emergency care or RTC including fever >101.15F, chest pain, shortness of breath, new/worsening/un-resolving symptoms, lightheadedness/near-syncope, increase in blood from rectum reviewed with patient at time of visit. Follow up and care instructions discussed and provided in AVS.  ---------------------------------  I have reviewed this encounter including the documentation in this note and/or discussed this patient with the Johney Maine, FNP, NP-C. I am certifying that I agree with the content of this note as supervising physician.  Enid Derry, [facility] 07/29/2016, 5:36 PM

## 2016-07-29 NOTE — Progress Notes (Signed)
Spoke with patient over the telephone regarding results, he is agreeable to watch for new/worsening/unimproving symptoms over the weekend and will present to ER if needed. He will move his follow-up with Dr. Manuella Ghazi up to next week to address ongoing anemia.

## 2016-08-01 ENCOUNTER — Ambulatory Visit (INDEPENDENT_AMBULATORY_CARE_PROVIDER_SITE_OTHER): Payer: Medicare HMO | Admitting: Family Medicine

## 2016-08-01 ENCOUNTER — Encounter: Payer: Self-pay | Admitting: Family Medicine

## 2016-08-01 VITALS — BP 120/71 | HR 77 | Temp 97.9°F | Resp 16 | Ht 67.0 in | Wt 120.6 lb

## 2016-08-01 DIAGNOSIS — D649 Anemia, unspecified: Secondary | ICD-10-CM | POA: Diagnosis not present

## 2016-08-01 DIAGNOSIS — K922 Gastrointestinal hemorrhage, unspecified: Secondary | ICD-10-CM | POA: Diagnosis not present

## 2016-08-01 LAB — CBC WITH DIFFERENTIAL/PLATELET
BASOS PCT: 0 %
Basophils Absolute: 0 cells/uL (ref 0–200)
EOS ABS: 130 {cells}/uL (ref 15–500)
Eosinophils Relative: 5 %
HEMATOCRIT: 34 % — AB (ref 38.5–50.0)
HEMOGLOBIN: 10.6 g/dL — AB (ref 13.2–17.1)
LYMPHS ABS: 754 {cells}/uL — AB (ref 850–3900)
Lymphocytes Relative: 29 %
MCH: 31.4 pg (ref 27.0–33.0)
MCHC: 31.2 g/dL — ABNORMAL LOW (ref 32.0–36.0)
MCV: 100.6 fL — AB (ref 80.0–100.0)
MONO ABS: 156 {cells}/uL — AB (ref 200–950)
MPV: 9.7 fL (ref 7.5–12.5)
Monocytes Relative: 6 %
NEUTROS ABS: 1560 {cells}/uL (ref 1500–7800)
Neutrophils Relative %: 60 %
Platelets: 118 10*3/uL — ABNORMAL LOW (ref 140–400)
RBC: 3.38 MIL/uL — ABNORMAL LOW (ref 4.20–5.80)
RDW: 15.2 % — ABNORMAL HIGH (ref 11.0–15.0)
WBC: 2.6 10*3/uL — ABNORMAL LOW (ref 3.8–10.8)

## 2016-08-01 LAB — RETICULOCYTES
ABS Retic: 10140 cells/uL — ABNORMAL LOW (ref 25000–90000)
RBC.: 3.38 MIL/uL — AB (ref 4.20–5.80)
Retic Ct Pct: 0.3 %

## 2016-08-01 NOTE — Progress Notes (Signed)
Name: Collin Gonzales   MRN: [id number]    DOB: 01/07/1936   Date:08/01/2016       Progress Note  Subjective  Chief Complaint  Chief Complaint  Patient presents with  . Follow-up    anemia    HPI  Patient presents to obtain follow up on Anemia, labs drawn last week show Hgb 11.2, Hct 33.9%, white count 3.4 and normal platelets at 167. He was seen for new onset diarrhea for 4 days, Hemoccult test came back positive at that time. He reports diarrhea has now resolved without antibiotics. Has not seen any blood in stools.  Denies any chest pain, abdominal pain, fevers, nausea, vomiting, or melena.    Past Medical History:  Diagnosis Date  . Arthritis    rheumatoid arthritis  . Cancer ([place])   . Collagen vascular disease ([place])   . COPD (chronic obstructive pulmonary disease) ([employer])   . GERD (gastroesophageal reflux disease)   . History of prostate cancer   . HOH (hard of hearing)   . Insomnia   . Lung cancer ([place])   . Oxygen deficit    2L [place] AND PRN  . Stroke ([place])    2000  . Wheezing     Past Surgical History:  Procedure Laterality Date  . APPENDECTOMY  1965  . CATARACT EXTRACTION W/PHACO Right 01/05/2016   Procedure: CATARACT EXTRACTION PHACO AND INTRAOCULAR LENS PLACEMENT (IOC);  Surgeon: [name], MD;  Location: [facility] ORS;  Service: Ophthalmology;  Laterality: Right;  [place] 1.14 AP% 18.1 CDE 13.44 FLUID PACK LOT # [id number]  . ENDOBRONCHIAL ULTRASOUND N/A 02/26/2015   Procedure: ENDOBRONCHIAL ULTRASOUND;  Surgeon: Flora Lipps, MD;  Location: [facility] ORS;  Service: Cardiopulmonary;  Laterality: N/A;  . HERNIA REPAIR  1965    Family History  Problem Relation Age of Onset  . Cancer Mother   . Cancer Father        Lung Cancer  . Cancer Sister   . Cancer Brother     Social History   Social History  . Marital status: Single    Spouse name: N/A  . Number of children: N/A  . Years of education: N/A   Occupational History  . Not on file.   Social History Main  Topics  . Smoking status: Former Smoker    Quit date: 02/21/2013  . Smokeless tobacco: Never Used  . Alcohol use No  . Drug use: No  . Sexual activity: Not on file   Other Topics Concern  . Not on file   Social History Narrative  . No narrative on file     Current Outpatient Prescriptions:  .  albuterol (PROAIR HFA) 108 (90 BASE) MCG/ACT inhaler, Inhale 2 puffs into the lungs every 4 (four) hours as needed for wheezing. , Disp: , Rfl:  .  aspirin EC 81 MG tablet, Take 81 mg by mouth daily., Disp: , Rfl:  .  atorvastatin (LIPITOR) 10 MG tablet, Take 1 tablet (10 mg total) by mouth daily at 6 PM., Disp: 90 tablet, Rfl: 0 .  B Complex-C (B-COMPLEX WITH VITAMIN C) tablet, Take 1 tablet by mouth daily., Disp: , Rfl:  .  Fluticasone-Salmeterol (ADVAIR DISKUS) 250-50 MCG/DOSE AEPB, Inhale 1 puff into the lungs every morning. , Disp: , Rfl:  .  methotrexate 2.5 MG tablet, Take 20 mg by mouth every Tuesday. With Supper, Disp: , Rfl:  .  mirtazapine (REMERON) 15 MG tablet, Take 1 tablet (15 mg total) by mouth at bedtime., Disp:  90 tablet, Rfl: 0 .  omeprazole (PRILOSEC) 40 MG capsule, Take 1 capsule (40 mg total) by mouth daily., Disp: 90 capsule, Rfl: 1 .  tiotropium (SPIRIVA HANDIHALER) 18 MCG inhalation capsule, Place into inhaler and inhale 2 (two) times daily. , Disp: , Rfl:  .  traZODone (DESYREL) 50 MG tablet, Take 1 tablet (50 mg total) by mouth at bedtime., Disp: 90 tablet, Rfl: 0  Allergies  Allergen Reactions  . Plaquenil [Hydroxychloroquine]   . Simvastatin Other (See Comments)    Bad dreams     ROS  Please see history of present illness for complete discussion of ROS  Objective  Vitals:   08/01/16 0930  BP: 120/71  Pulse: 77  Resp: 16  Temp: 97.9 F (36.6 C)  TempSrc: Oral  SpO2: 96%  Weight: 120 lb 9.6 oz (54.7 kg)  Height: 5\' 7"  (1.702 m)    Physical Exam  Constitutional: He is well-developed, well-nourished, and in no distress.  Cardiovascular: Normal  rate, regular rhythm and normal heart sounds.   No murmur heard. Pulmonary/Chest: Effort normal and breath sounds normal. He has no wheezes.  Abdominal: Soft. Bowel sounds are normal. There is no tenderness.  Genitourinary: Rectal exam shows guaiac positive stool. Rectal exam shows no external hemorrhoid, no internal hemorrhoid, no fissure and no mass.  Nursing note and vitals reviewed.    Assessment & Plan  1. Gastrointestinal hemorrhage, unspecified gastrointestinal hemorrhage type Suspect melena, patient has not been taking PPI for some time. Advised to restart and referral to gastroenterology for possible endoscopy - Ambulatory referral to Gastroenterology - POC Hemoccult Bld/Stl (1-Cd Office Dx)  2. Anemia, unspecified type Obtain workup for anemia, most likely from acute blood loss, - CBC with Differential/Platelet - Iron, TIBC and Ferritin Panel - Reticulocytes - POC Hemoccult Bld/Stl (1-Cd Office Dx)   Dossie Der Asad A. [facility] 08/01/2016 9:34 AM

## 2016-08-02 LAB — POC HEMOCCULT BLD/STL (OFFICE/1-CARD/DIAGNOSTIC): Fecal Occult Blood, POC: POSITIVE — AB

## 2016-08-02 LAB — IRON,TIBC AND FERRITIN PANEL
%SAT: 11 % — AB (ref 15–60)
Ferritin: 119 ng/mL (ref 20–380)
Iron: 28 ug/dL — ABNORMAL LOW (ref 50–180)
TIBC: 265 ug/dL (ref 250–425)

## 2016-08-03 ENCOUNTER — Other Ambulatory Visit: Payer: Self-pay | Admitting: Emergency Medicine

## 2016-08-03 DIAGNOSIS — D61818 Other pancytopenia: Secondary | ICD-10-CM

## 2016-08-04 ENCOUNTER — Ambulatory Visit: Payer: Commercial Managed Care - HMO | Admitting: Radiation Oncology

## 2016-08-06 DIAGNOSIS — J449 Chronic obstructive pulmonary disease, unspecified: Secondary | ICD-10-CM | POA: Diagnosis not present

## 2016-08-11 DIAGNOSIS — H2512 Age-related nuclear cataract, left eye: Secondary | ICD-10-CM | POA: Diagnosis not present

## 2016-08-18 ENCOUNTER — Telehealth: Payer: Self-pay | Admitting: Family Medicine

## 2016-08-18 NOTE — Telephone Encounter (Signed)
Requesting return call. Received message about referral to cancer center and he does not understand why. Stated that he had went to the cancer center about 3 weeks ago.

## 2016-08-19 NOTE — Telephone Encounter (Signed)
Patient was informed that the reason for the hematology referral is due to the recent abnormal blood test results. Patient was strongly encouraged to keep his appointment with Dr. [name] on 08/23/16 at 3pm.   Patient expressed verbal understanding and said ok.

## 2016-08-22 DIAGNOSIS — D61818 Other pancytopenia: Secondary | ICD-10-CM | POA: Insufficient documentation

## 2016-08-22 NOTE — Progress Notes (Signed)
[place]  [phone] [phone]) [phone]  ID: [name]: 1935/05/12  MR#: [id number]  KWI#:[id number]  Patient Care Team: Roselee Nova, MD as PCP - General (Family Medicine) [name], MD as Referring Physician (Ophthalmology) [name], MD as Consulting Physician (Rheumatology) [name], MD as Referring Physician (Specialist) Royston Cowper, MD as Consulting Physician (Urology)  CHIEF COMPLAINT: Pancytopenia.  INTERVAL HISTORY: Patient is a 81 year old male whose last evaluated over 18 months ago for lung mass. Patient underwent SBRT at that time and had a recent CT scan revealed no evidence of recurrence. He is referred back to clinic for new onset pancytopenia. He currently feels well and is asymptomatic. He has no neurologic complaints. He denies any recent fevers or illnesses. His good appetite and denies weight loss. He has no chest pain, shortness of breath, cough, or hemoptysis. He denies any nausea, vomiting, constipation, or diarrhea. He has no urinary complaints. Patient feels at his baseline and offers no specific complaints today.  REVIEW OF SYSTEMS:   Review of Systems  Constitutional: Negative.  Negative for fever, malaise/fatigue and weight loss.  Respiratory: Negative.  Negative for cough, hemoptysis and shortness of breath.   Cardiovascular: Negative.  Negative for chest pain and leg swelling.  Gastrointestinal: Negative.  Negative for abdominal pain, blood in stool and melena.  Genitourinary: Negative.   Musculoskeletal: Negative.   Skin: Negative.  Negative for rash.  Neurological: Negative.  Negative for sensory change and weakness.  Psychiatric/Behavioral: Negative.  The patient is not nervous/anxious.     As per HPI. Otherwise, a complete review of systems is negative.  PAST MEDICAL HISTORY: Past Medical History:  Diagnosis Date  . Arthritis    rheumatoid arthritis  . Cancer  ([place])   . Collagen vascular disease ([place])   . COPD (chronic obstructive pulmonary disease) ([place])   . GERD (gastroesophageal reflux disease)   . History of prostate cancer   . HOH (hard of hearing)   . Insomnia   . Lung cancer ([place])   . Oxygen deficit    2L HS AND PRN  . Stroke ([place])    2000  . Wheezing     PAST SURGICAL HISTORY: Past Surgical History:  Procedure Laterality Date  . APPENDECTOMY  1965  . CATARACT EXTRACTION W/PHACO Right 01/05/2016   Procedure: CATARACT EXTRACTION PHACO AND INTRAOCULAR LENS PLACEMENT (IOC);  Surgeon: [name], MD;  Location: [facility] ORS;  Service: Ophthalmology;  Laterality: Right;  [place] 1.14 AP% 18.1 CDE 13.44 FLUID PACK LOT # [id number]  . ENDOBRONCHIAL ULTRASOUND N/A 02/26/2015   Procedure: ENDOBRONCHIAL ULTRASOUND;  Surgeon: Flora Lipps, MD;  Location: [facility] ORS;  Service: Cardiopulmonary;  Laterality: N/A;  . HERNIA REPAIR  1965    FAMILY HISTORY: Family History  Problem Relation Age of Onset  . Cancer Mother   . Cancer Father        Lung Cancer  . Cancer Sister   . Cancer Brother     ADVANCED DIRECTIVES (Y/N):  N  HEALTH MAINTENANCE: Social History  Substance Use Topics  . Smoking status: Former Smoker    Quit date: 02/21/2013  . Smokeless tobacco: Never Used  . Alcohol use No     Colonoscopy:  PAP:  Bone density:  Lipid panel:  Allergies  Allergen Reactions  . Plaquenil [Hydroxychloroquine]   . Simvastatin Other (See Comments)    Bad dreams    Current Outpatient Prescriptions  Medication Sig Dispense Refill  .  albuterol (PROAIR HFA) 108 (90 BASE) MCG/ACT inhaler Inhale 2 puffs into the lungs every 4 (four) hours as needed for wheezing.     Marland Kitchen aspirin EC 81 MG tablet Take 81 mg by mouth daily.    Marland Kitchen atorvastatin (LIPITOR) 10 MG tablet Take 1 tablet (10 mg total) by mouth daily at 6 PM. 90 tablet 0  . B Complex-C (B-COMPLEX WITH VITAMIN C) tablet Take 1 tablet by mouth daily.    . Fluticasone-Salmeterol (ADVAIR DISKUS)  250-50 MCG/DOSE AEPB Inhale 1 puff into the lungs every morning.     . methotrexate 2.5 MG tablet Take 20 mg by mouth every Tuesday. With Supper    . mirtazapine (REMERON) 15 MG tablet Take 1 tablet (15 mg total) by mouth at bedtime. 90 tablet 0  . omeprazole (PRILOSEC) 40 MG capsule Take 1 capsule (40 mg total) by mouth daily. 90 capsule 1  . tiotropium (SPIRIVA HANDIHALER) 18 MCG inhalation capsule Place into inhaler and inhale 2 (two) times daily.     . traZODone (DESYREL) 50 MG tablet Take 1 tablet (50 mg total) by mouth at bedtime. 90 tablet 0   No current facility-administered medications for this visit.     OBJECTIVE: Vitals:   08/23/16 1502  BP: (!) 144/83  Pulse: 81  Resp: 20  Temp: (!) 96.9 F (36.1 C)     Body mass index is 19.15 kg/m.    ECOG FS:0 - Asymptomatic  General: Well-developed, well-nourished, no acute distress. Eyes: Pink conjunctiva, anicteric sclera. HEENT: Normocephalic, moist mucous membranes, clear oropharnyx. Lungs: Clear to auscultation bilaterally. Heart: Regular rate and rhythm. No rubs, murmurs, or gallops. Abdomen: Soft, nontender, nondistended. No organomegaly noted, normoactive bowel sounds. Musculoskeletal: No edema, cyanosis, or clubbing. Neuro: Alert, answering all questions appropriately. Cranial nerves grossly intact. Skin: No rashes or petechiae noted. Psych: Normal affect. Lymphatics: No cervical, calvicular, axillary or inguinal LAD.   LAB RESULTS:  Lab Results  Component Value Date   NA 139 07/29/2016   K 3.7 07/29/2016   CL 101 07/29/2016   CO2 30 07/29/2016   GLUCOSE 90 07/29/2016   BUN 20 07/29/2016   CREATININE 1.06 07/29/2016   CALCIUM 9.1 07/29/2016   PROT 8.6 (H) 07/29/2016   ALBUMIN 3.2 (L) 07/29/2016   AST 22 07/29/2016   ALT 13 (L) 07/29/2016   ALKPHOS 76 07/29/2016   BILITOT 0.5 07/29/2016   GFRNONAA >60 07/29/2016   GFRAA >60 07/29/2016    Lab Results  Component Value Date   WBC 3.8 08/23/2016    NEUTROABS 1,560 08/01/2016   HGB 11.1 (L) 08/23/2016   HCT 32.8 (L) 08/23/2016   MCV 97.1 08/23/2016   PLT 404 08/23/2016     STUDIES: Ct Chest W Contrast  Result Date: 07/25/2016 CLINICAL DATA:  Hypermetabolic right upper lobe lung mass status post SBRT in 2017, presenting for restaging. COPD. EXAM: CT CHEST WITH CONTRAST TECHNIQUE: Multidetector CT imaging of the chest was performed during intravenous contrast administration. CONTRAST:  32mL ISOVUE-300 IOPAMIDOL (ISOVUE-300) INJECTION 61% COMPARISON:  01/04/2016 chest CT.  04/08/2016 chest radiograph. FINDINGS: Cardiovascular: Normal heart size. No significant pericardial fluid/thickening. Left main, left anterior descending, left circumflex and right coronary atherosclerosis. Atherosclerotic nonaneurysmal thoracic aorta. Normal caliber pulmonary arteries. No central pulmonary emboli. Mediastinum/Nodes: No discrete thyroid nodules. Unremarkable esophagus. No pathologically enlarged axillary, mediastinal or hilar lymph nodes. Lungs/Pleura: No pneumothorax. No pleural effusion. No discrete residual measurable pulmonary nodule in the posterior apical right upper lobe. Small irregular bandlike opacity at  the site of the treated posterior apical right upper lobe nodule with associated mild distortion is compatible with postradiation change. Clustered solid pulmonary nodules measuring up to 5 mm in the superior segment left lower lobe (series 3/ image 99), stable back to 06/05/2015 and considered benign. Right middle lobe 6 mm solid pulmonary nodule (series 3/ image 111) is stable back to 01/14/2015 and considered benign. Mildly thickened bandlike opacity in the anterior right upper lobe (series 3/ image 55) is stable back to 01/14/2015 and compatible with benign postinfectious/ postinflammatory scarring. There is new debris nearly filling the right middle lobe bronchus. New bandlike consolidation in the dependent basilar right middle lobe with associated  volume loss, most compatible with evolving postinfectious/ postinflammatory scarring and/or atelectasis. No new significant pulmonary nodules. Severe centrilobular and paraseptal emphysema with diffuse bronchial wall thickening. Upper abdomen: Stable granulomatous posterior right lower lobe calcification. Musculoskeletal: No aggressive appearing focal osseous lesions. Mild thoracic spondylosis. IMPRESSION: 1. Mild postradiation change at the site of the treated posterior apical right upper lobe neoplasm, with no residual discrete measurable nodule at the treatment site. 2. No findings suspicious for metastatic disease in the chest . 3. New debris nearly filling the right middle lobe bronchus. New bandlike consolidation and volume loss in the dependent basilar right middle lobe. These findings are most compatible with evolving postinfectious/postinflammatory scarring and/or atelectasis in the right middle lobe, and can be reassessed on follow-up surveillance chest CT. 4. Severe emphysema with diffuse bronchial wall thickening, suggestive of COPD. 5. Aortic atherosclerosis. Left main and 3 vessel coronary atherosclerosis. Electronically Signed   By: Ilona Sorrel M.D.   On: 07/25/2016 10:55    ASSESSMENT: Pancytopenia  PLAN:    1. Pancytopenia: . Patient's white blood cell count and platelet count are now within normal limits. Unclear etiology of patient's transient drop. He continues to have a mild anemia and was noted to have iron deficiency on August 01, 2016. He also has a decreased folate as well as B-12 levels. Previously noted to have heme-positive stools and recommend a referral to GI for further evaluation. Will give patient folate supplements and monthly B-12 injections. Return to clinic in 3 months for repeat laboratory work. Will consider IV Feraheme at that time as well. 2. Right upper lobe mass: Although biopsy was negative, patient had a positive PET scan that was highly suspicious for underlying  malignancy. He underwent SBRT in approximately May 2017. Repeat CT scan on July 25, 2016 revealed no residual or progressive disease. Continue monitoring and follow-up per radiation oncology.  Approximately 45 minutes was spent in discussion of which greater than 50 was consultation.  Patient expressed understanding and was in agreement with this plan. He also understands that He can call clinic at any time with any questions, concerns, or complaints.   Cancer Staging No matching staging information was found for the patient.  Lloyd Huger, MD   08/24/2016 7:46 AM

## 2016-08-23 ENCOUNTER — Inpatient Hospital Stay: Payer: Medicare HMO | Attending: Oncology | Admitting: Oncology

## 2016-08-23 ENCOUNTER — Inpatient Hospital Stay: Payer: Medicare HMO

## 2016-08-23 DIAGNOSIS — I998 Other disorder of circulatory system: Secondary | ICD-10-CM | POA: Insufficient documentation

## 2016-08-23 DIAGNOSIS — R918 Other nonspecific abnormal finding of lung field: Secondary | ICD-10-CM | POA: Diagnosis not present

## 2016-08-23 DIAGNOSIS — I700 Atherosclerosis of aorta: Secondary | ICD-10-CM | POA: Insufficient documentation

## 2016-08-23 DIAGNOSIS — M069 Rheumatoid arthritis, unspecified: Secondary | ICD-10-CM | POA: Diagnosis not present

## 2016-08-23 DIAGNOSIS — Z79899 Other long term (current) drug therapy: Secondary | ICD-10-CM | POA: Diagnosis not present

## 2016-08-23 DIAGNOSIS — K219 Gastro-esophageal reflux disease without esophagitis: Secondary | ICD-10-CM | POA: Insufficient documentation

## 2016-08-23 DIAGNOSIS — D61818 Other pancytopenia: Secondary | ICD-10-CM | POA: Diagnosis not present

## 2016-08-23 DIAGNOSIS — Z8673 Personal history of transient ischemic attack (TIA), and cerebral infarction without residual deficits: Secondary | ICD-10-CM | POA: Diagnosis not present

## 2016-08-23 DIAGNOSIS — Z87891 Personal history of nicotine dependence: Secondary | ICD-10-CM

## 2016-08-23 DIAGNOSIS — J449 Chronic obstructive pulmonary disease, unspecified: Secondary | ICD-10-CM

## 2016-08-23 DIAGNOSIS — Z7982 Long term (current) use of aspirin: Secondary | ICD-10-CM | POA: Insufficient documentation

## 2016-08-23 DIAGNOSIS — D500 Iron deficiency anemia secondary to blood loss (chronic): Secondary | ICD-10-CM

## 2016-08-23 DIAGNOSIS — Z85118 Personal history of other malignant neoplasm of bronchus and lung: Secondary | ICD-10-CM | POA: Diagnosis not present

## 2016-08-23 DIAGNOSIS — Z801 Family history of malignant neoplasm of trachea, bronchus and lung: Secondary | ICD-10-CM | POA: Insufficient documentation

## 2016-08-23 LAB — FOLATE: Folate: 5.1 ng/mL — ABNORMAL LOW (ref >5.9)

## 2016-08-23 LAB — LACTATE DEHYDROGENASE: LDH: 100 U/L (ref 98–192)

## 2016-08-23 LAB — CBC
HEMATOCRIT: 32.8 % — AB (ref 40.0–52.0)
HEMOGLOBIN: 11.1 g/dL — AB (ref 13.0–18.0)
MCH: 32.9 pg (ref 26.0–34.0)
MCHC: 33.8 g/dL (ref 32.0–36.0)
MCV: 97.1 fL (ref 80.0–100.0)
PLATELETS: 404 10*3/uL (ref 150–440)
RBC: 3.37 MIL/uL — AB (ref 4.40–5.90)
RDW: 16 % — ABNORMAL HIGH (ref 11.5–14.5)
WBC: 3.8 10*3/uL (ref 3.8–10.6)

## 2016-08-23 LAB — VITAMIN B12: Vitamin B-12: 139 pg/mL — ABNORMAL LOW (ref 180–914)

## 2016-08-23 NOTE — Progress Notes (Signed)
Patient denies any concerns today.  

## 2016-08-24 LAB — PLATELET ANTIBODY PROFILE
GLYCOPROTEIN IV ANTIBODY: NEGATIVE
HLA AB SER QL EIA: NEGATIVE
IB/IX ANTIBODY: NEGATIVE
IIB/IIIA ANTIBODY: POSITIVE — AB

## 2016-08-24 LAB — CEA: CEA: 2.2 ng/mL (ref 0.0–4.7)

## 2016-08-24 LAB — HAPTOGLOBIN: HAPTOGLOBIN: 223 mg/dL — AB (ref 34–200)

## 2016-08-24 MED ORDER — FOLIC ACID 1 MG PO TABS: 1.0000 mg | ORAL_TABLET | Freq: Every day | ORAL | 5 refills | Status: DC

## 2016-08-25 LAB — PROTEIN ELECTROPHORESIS, SERUM
A/G Ratio: 0.6 — ABNORMAL LOW (ref 0.7–1.7)
ALPHA-2-GLOBULIN: 0.7 g/dL (ref 0.4–1.0)
Albumin ELP: 3 g/dL (ref 2.9–4.4)
Alpha-1-Globulin: 0.3 g/dL (ref 0.0–0.4)
Beta Globulin: 1.5 g/dL — ABNORMAL HIGH (ref 0.7–1.3)
GLOBULIN, TOTAL: 5.2 g/dL — AB (ref 2.2–3.9)
Gamma Globulin: 2.6 g/dL — ABNORMAL HIGH (ref 0.4–1.8)
Total Protein ELP: 8.2 g/dL (ref 6.0–8.5)

## 2016-08-25 LAB — COMP PANEL: LEUKEMIA/LYMPHOMA

## 2016-08-30 ENCOUNTER — Ambulatory Visit: Payer: Medicare HMO | Admitting: Family Medicine

## 2016-08-30 ENCOUNTER — Inpatient Hospital Stay: Payer: Medicare HMO

## 2016-08-30 DIAGNOSIS — I700 Atherosclerosis of aorta: Secondary | ICD-10-CM | POA: Diagnosis not present

## 2016-08-30 DIAGNOSIS — Z8673 Personal history of transient ischemic attack (TIA), and cerebral infarction without residual deficits: Secondary | ICD-10-CM | POA: Diagnosis not present

## 2016-08-30 DIAGNOSIS — D500 Iron deficiency anemia secondary to blood loss (chronic): Secondary | ICD-10-CM

## 2016-08-30 DIAGNOSIS — M069 Rheumatoid arthritis, unspecified: Secondary | ICD-10-CM | POA: Diagnosis not present

## 2016-08-30 DIAGNOSIS — I998 Other disorder of circulatory system: Secondary | ICD-10-CM | POA: Diagnosis not present

## 2016-08-30 DIAGNOSIS — D61818 Other pancytopenia: Secondary | ICD-10-CM | POA: Diagnosis not present

## 2016-08-30 DIAGNOSIS — J449 Chronic obstructive pulmonary disease, unspecified: Secondary | ICD-10-CM | POA: Diagnosis not present

## 2016-08-30 DIAGNOSIS — Z85118 Personal history of other malignant neoplasm of bronchus and lung: Secondary | ICD-10-CM | POA: Diagnosis not present

## 2016-08-30 DIAGNOSIS — K219 Gastro-esophageal reflux disease without esophagitis: Secondary | ICD-10-CM | POA: Diagnosis not present

## 2016-08-30 DIAGNOSIS — R918 Other nonspecific abnormal finding of lung field: Secondary | ICD-10-CM | POA: Diagnosis not present

## 2016-08-30 MED ORDER — CYANOCOBALAMIN 1000 MCG/ML IJ SOLN
1000.0000 ug | Freq: Once | INTRAMUSCULAR | Status: AC
  Administered 2016-08-30: 1000 ug via INTRAMUSCULAR

## 2016-09-02 ENCOUNTER — Other Ambulatory Visit: Payer: Self-pay | Admitting: Family Medicine

## 2016-09-02 DIAGNOSIS — Z9189 Other specified personal risk factors, not elsewhere classified: Secondary | ICD-10-CM

## 2016-09-05 DIAGNOSIS — J449 Chronic obstructive pulmonary disease, unspecified: Secondary | ICD-10-CM | POA: Diagnosis not present

## 2016-09-06 ENCOUNTER — Ambulatory Visit (INDEPENDENT_AMBULATORY_CARE_PROVIDER_SITE_OTHER): Payer: Medicare HMO | Admitting: Gastroenterology

## 2016-09-06 ENCOUNTER — Encounter: Payer: Self-pay | Admitting: Gastroenterology

## 2016-09-06 ENCOUNTER — Other Ambulatory Visit: Payer: Self-pay

## 2016-09-06 VITALS — BP 119/72 | HR 80 | Temp 98.7°F | Ht 67.0 in | Wt 124.2 lb

## 2016-09-06 DIAGNOSIS — Z23 Encounter for immunization: Secondary | ICD-10-CM | POA: Insufficient documentation

## 2016-09-06 DIAGNOSIS — K2100 Gastro-esophageal reflux disease with esophagitis, without bleeding: Secondary | ICD-10-CM | POA: Insufficient documentation

## 2016-09-06 DIAGNOSIS — D801 Nonfamilial hypogammaglobulinemia: Secondary | ICD-10-CM | POA: Insufficient documentation

## 2016-09-06 DIAGNOSIS — D509 Iron deficiency anemia, unspecified: Secondary | ICD-10-CM

## 2016-09-06 DIAGNOSIS — H269 Unspecified cataract: Secondary | ICD-10-CM | POA: Insufficient documentation

## 2016-09-06 DIAGNOSIS — R9389 Abnormal findings on diagnostic imaging of other specified body structures: Secondary | ICD-10-CM | POA: Insufficient documentation

## 2016-09-06 DIAGNOSIS — D521 Drug-induced folate deficiency anemia: Secondary | ICD-10-CM | POA: Insufficient documentation

## 2016-09-06 NOTE — Addendum Note (Signed)
Addended by: Peggye Ley on: 09/06/2016 02:48 PM   Modules accepted: Orders

## 2016-09-06 NOTE — Progress Notes (Signed)
Collin Bellows MD, MRCP(U.K) [address]  [place]  [place], [place] [address]  Main: [phone]  Fax: [phone]   Gastroenterology Consultation  Referring Provider:     Roselee Nova, MD Primary Care Physician:  Collin Nova, MD Primary Gastroenterologist:  Dr. Jonathon Gonzales  Reason for Consultation:     GI bleed         HPI:   Collin Gonzales is a 81 y.o. y/o male referred for consultation & management  by Dr. Manuella Gonzales, Talbert Forest, MD.    He has been referred for anemia, stool occult positive.Found to have iron deficiency in 07/2016 , seen by Dr Collin Gonzales  .  Labs 07/2016 - Low iron % sat at 11 , iron 28    Rectal bleeding: none  Nose bleeds: none Hematemesis or hemoptysis : none  Blood in urine : none   No change in his bowel habits, no family history of colon cancer .   His last colonoscopy was 10 years back and was told it was normal and didn't need any further due to his age, He is on oxygen as needed. Quit smoking 1.5 years back , not on any blood thinners. He has lost some weight but says he is gaining it back . He takes methotrexate for arthritis.    Past Medical History:  Diagnosis Date  . Arthritis    rheumatoid arthritis  . Cancer ([place])   . Collagen vascular disease ([place])   . COPD (chronic obstructive pulmonary disease) ([place])   . GERD (gastroesophageal reflux disease)   . History of prostate cancer   . HOH (hard of hearing)   . Insomnia   . Lung cancer ([place])   . Oxygen deficit    2L HS AND PRN  . Stroke ([place])    2000  . Wheezing     Past Surgical History:  Procedure Laterality Date  . APPENDECTOMY  1965  . CATARACT EXTRACTION W/PHACO Right 01/05/2016   Procedure: CATARACT EXTRACTION PHACO AND INTRAOCULAR LENS PLACEMENT (IOC);  Surgeon: Gonzales Robson, MD;  Location: [facility] ORS;  Service: Ophthalmology;  Laterality: Right;  [place] 1.14 AP% 18.1 CDE 13.44 FLUID PACK LOT # [id number] H  . ENDOBRONCHIAL ULTRASOUND N/A 02/26/2015   Procedure:  ENDOBRONCHIAL ULTRASOUND;  Surgeon: Collin Lipps, MD;  Location: [facility] ORS;  Service: Cardiopulmonary;  Laterality: N/A;  . [place]    Prior to Admission medications   Medication Sig Start Date End Date Taking? Authorizing Provider  albuterol (PROAIR HFA) 108 (90 BASE) MCG/ACT inhaler Inhale 2 puffs into the lungs every 4 (four) hours as needed for wheezing.  05/20/14   [provider]  aspirin EC 81 MG tablet Take 81 mg by mouth daily.    [provider]  atorvastatin (LIPITOR) 10 MG tablet TAKE 1 TABLET (10 MG TOTAL) BY MOUTH DAILY AT 6 PM. 09/02/16   Collin Nova, MD  B Complex-C (B-COMPLEX WITH VITAMIN C) tablet Take 1 tablet by mouth daily.    [provider]  Fluticasone-Salmeterol (ADVAIR DISKUS) 250-50 MCG/DOSE AEPB Inhale 1 puff into the lungs every morning.  05/20/14   [provider]  folic acid (FOLVITE) 1 MG tablet Take 1 tablet (1 mg total) by mouth daily. 08/24/16   Collin Huger, MD  methotrexate 2.5 MG tablet Take 20 mg by mouth every Tuesday. With Supper    [provider]  mirtazapine (REMERON) 15 MG tablet Take 1 tablet (  15 mg total) by mouth at bedtime. 12/02/15   Collin Nova, MD  omeprazole (PRILOSEC) 40 MG capsule Take 1 capsule (40 mg total) by mouth daily. 08/05/15   Collin Nova, MD  tiotropium (SPIRIVA HANDIHALER) 18 MCG inhalation capsule Place into inhaler and inhale 2 (two) times daily.  05/20/14   [provider]  traZODone (DESYREL) 50 MG tablet Take 1 tablet (50 mg total) by mouth at bedtime. 07/29/16   Hubbard Hartshorn, FNP    Family History  Problem Relation Age of Onset  . Cancer Mother   . Cancer Father        Lung Cancer  . Cancer Sister   . Cancer Brother      Social History  Substance Use Topics  . Smoking status: Former Smoker    Quit date: 02/21/2013  . Smokeless tobacco: Never Used  . Alcohol use No    Allergies as of 09/06/2016 - Review Complete 08/01/2016    Allergen Reaction Noted  . Plaquenil [hydroxychloroquine]  02/26/2015  . Simvastatin Other (See Comments) 03/10/2015    Review of Systems:    All systems reviewed and negative except where noted in HPI.   Physical Exam:  There were no vitals taken for this visit. No LMP for male patient. Psych:  Alert and cooperative. Normal mood and affect. General:   Alert,  Well-developed, well-nourished, pleasant and cooperative in NAD Head:  Normocephalic and atraumatic. Eyes:  Sclera clear, no icterus.   Conjunctiva pink. Ears:  Normal auditory acuity. Nose:  No deformity, discharge, or lesions. Mouth:  No deformity or lesions,oropharynx pink & moist. Neck:  Supple; no masses or thyromegaly. Lungs:  On oxygen , decreased air entry b/l , no added sounds  Heart:  Regular rate and rhythm; no murmurs, clicks, rubs, or gallops. Abdomen:  Normal bowel sounds.  No bruits.  Soft, non-tender and non-distended without masses, hepatosplenomegaly or hernias noted.  No guarding or rebound tenderness.    Extremities:  No clubbing or edema.  No cyanosis. Neurologic:  Alert and oriented x3;  grossly normal neurologically. Psych:  Alert and cooperative. Normal mood and affect.  Imaging Studies: No results found.  Assessment and Plan:   Collin Gonzales is a 81 y.o. y/o male has been referred for GI bleed. He has an Iron deficiency anemia. No overt blood loss.He does have severe COPD requiring ambulatory oxygen , I explained the purpose of endoscopy is mainly to r/o any neoplasm as the source of iron loss, I also did explain that higher risks associated with anesthesia due to his COPD. He chose not to proceed with any endoscopy at this time. He will think about it. I informed him that if he wishes to pursue the procedures to call my office to schedule and he will need anesthesia pre op clearance.   Plan  1. Urine analysis 2. EGD+colonoscopy when he decides to proceed    Follow up in 6 months   Dr  Collin Bellows MD,MRCP(U.K)

## 2016-09-07 LAB — URINALYSIS, ROUTINE W REFLEX MICROSCOPIC
Bilirubin Urine: NEGATIVE
Glucose, UA: NEGATIVE
Hgb urine dipstick: NEGATIVE
KETONES UR: NEGATIVE
NITRITE: POSITIVE — AB
PH: 6 (ref 5.0–8.0)
SPECIFIC GRAVITY, URINE: 1.016 (ref 1.001–1.035)

## 2016-09-07 LAB — URINALYSIS, MICROSCOPIC ONLY
CASTS: NONE SEEN [LPF]
Squamous Epithelial / LPF: NONE SEEN [HPF] (ref <=5)
Yeast: NONE SEEN [HPF]

## 2016-09-12 ENCOUNTER — Telehealth: Payer: Self-pay | Admitting: Family Medicine

## 2016-09-12 NOTE — Telephone Encounter (Signed)
Pt states he spoke with you on Friday and was told that he found an infection. That you where going to put him on an antibiotic however [facility] does not have the antibiotic.

## 2016-09-13 ENCOUNTER — Encounter: Payer: Self-pay | Admitting: Family Medicine

## 2016-09-13 ENCOUNTER — Ambulatory Visit (INDEPENDENT_AMBULATORY_CARE_PROVIDER_SITE_OTHER): Payer: Medicare HMO | Admitting: Family Medicine

## 2016-09-13 VITALS — BP 110/80 | HR 82 | Temp 97.7°F | Resp 16 | Ht 67.0 in | Wt 121.0 lb

## 2016-09-13 DIAGNOSIS — N3000 Acute cystitis without hematuria: Secondary | ICD-10-CM

## 2016-09-13 MED ORDER — CIPROFLOXACIN HCL 500 MG PO TABS: 500.0000 mg | ORAL_TABLET | Freq: Two times a day (BID) | ORAL | 0 refills | Status: AC

## 2016-09-13 NOTE — Telephone Encounter (Signed)
Patient has appointment scheduled for today at 4:00 pm

## 2016-09-13 NOTE — Progress Notes (Signed)
Name: Collin Gonzales   MRN: [id number]    DOB: [birth year]   Date:09/13/2016       Progress Note  Subjective  Chief Complaint  Chief Complaint  Patient presents with  . Urinary Tract Infection    Urinary Tract Infection   This is a new problem. The problem has been unchanged. The patient is experiencing no pain. There has been no fever. He is not sexually active. There is no history of pyelonephritis. Associated symptoms include urgency. Pertinent negatives include no chills, discharge or flank pain. hx of prostate cancer treated with radiation seeds implant.     Past Medical History:  Diagnosis Date  . Arthritis    rheumatoid arthritis  . Cancer ([place])   . Collagen vascular disease ([place])   . COPD (chronic obstructive pulmonary disease) ([place])   . GERD (gastroesophageal reflux disease)   . History of prostate cancer   . HOH (hard of hearing)   . Insomnia   . Lung cancer ([place])   . Oxygen deficit    2L HS AND PRN  . Stroke ([place])    2000  . Wheezing     Past Surgical History:  Procedure Laterality Date  . APPENDECTOMY  1965  . CATARACT EXTRACTION W/PHACO Right 01/05/2016   Procedure: CATARACT EXTRACTION PHACO AND INTRAOCULAR LENS PLACEMENT (IOC);  Surgeon: [name], MD;  Location: [facility] OR[facility];  Service: Ophthalmology;  Laterality: Right;  [place] 1.14 AP% 18.1 CDE 13.44 FLUID PACK LOT # [id number] H  . ENDOBRONCHIAL ULTRASOUND N/A 02/26/2015   Procedure: ENDOBRONCHIAL ULTRASOUND;  Surgeon: Flora Lipps, MD;  Location: [facility] OR[facility];  Service: Cardiopulmonary;  Laterality: N/A;  . HERNIA REPAIR  1965    Family History  Problem Relation Age of Onset  . Cancer Mother   . Cancer Father        Lung Cancer  . Cancer Sister   . Cancer Brother     Social History   Social History  . Marital status: Single    Spouse name: N/A  . Number of children: N/A  . Years of education: N/A   Occupational History  . Not on file.   Social History Main Topics  . Smoking status: Former  Smoker    Quit date: 02/21/2013  . Smokeless tobacco: Never Used  . Alcohol use No  . Drug use: No  . Sexual activity: Not on file   Other Topics Concern  . Not on file   Social History Narrative  . No narrative on file     Current Outpatient Prescriptions:  .  albuterol (PROAIR HFA) 108 (90 BASE) MCG/ACT inhaler, Inhale 2 puffs into the lungs every 4 (four) hours as needed for wheezing. , Disp: , Rfl:  .  aspirin EC 81 MG tablet, Take 81 mg by mouth daily., Disp: , Rfl:  .  atorvastatin (LIPITOR) 10 MG tablet, TAKE 1 TABLET (10 MG TOTAL) BY MOUTH DAILY AT 6 PM., Disp: 90 tablet, Rfl: 0 .  B Complex-C (B-COMPLEX WITH VITAMIN C) tablet, Take 1 tablet by mouth daily., Disp: , Rfl:  .  Fluticasone-Salmeterol (ADVAIR DISKUS) 250-50 MCG/DOSE AEPB, Inhale 1 puff into the lungs every morning. , Disp: , Rfl:  .  folic acid (FOLVITE) 1 MG tablet, Take 1 tablet (1 mg total) by mouth daily., Disp: 30 tablet, Rfl: 5 .  methotrexate 2.5 MG tablet, Take 20 mg by mouth every Tuesday. With Supper, Disp: , Rfl:  .  mirtazapine (REMERON) 15 MG tablet, Take 1 tablet (  15 mg total) by mouth at bedtime., Disp: 90 tablet, Rfl: 0 .  omeprazole (PRILOSEC) 40 MG capsule, Take 1 capsule (40 mg total) by mouth daily., Disp: 90 capsule, Rfl: 1 .  tiotropium (SPIRIVA HANDIHALER) 18 MCG inhalation capsule, Place into inhaler and inhale 2 (two) times daily. , Disp: , Rfl:  .  traZODone (DESYREL) 50 MG tablet, Take 1 tablet (50 mg total) by mouth at bedtime., Disp: 90 tablet, Rfl: 0  Allergies  Allergen Reactions  . Plaquenil [Hydroxychloroquine]   . Simvastatin Other (See Comments)    Bad dreams     Review of Systems  Constitutional: Negative for chills.  Genitourinary: Positive for urgency. Negative for flank pain.      Objective  Vitals:   09/13/16 1310  BP: 110/80  Pulse: 82  Resp: 16  Temp: 97.7 F (36.5 C)  TempSrc: Oral  SpO2: 95%  Weight: 121 lb (54.9 kg)  Height: 5\' 7"  (1.702 m)     Physical Exam  Constitutional: He is oriented to person, place, and time and well-developed, well-nourished, and in no distress.  HENT:  Head: Normocephalic and atraumatic.  Cardiovascular: Normal rate, regular rhythm and normal heart sounds.   No murmur heard. Pulmonary/Chest: Effort normal and breath sounds normal. He has no wheezes.  Abdominal: Soft. Bowel sounds are normal. There is no tenderness.  Neurological: He is alert and oriented to person, place, and time.  Psychiatric: Mood, memory, affect and judgment normal.  Nursing note and vitals reviewed.    Assessment & Plan  1. Acute cystitis without hematuria Start on urinalysis, start on empiric antimicrobial treatment, pain urinalysis and culture. - Urinalysis, Routine w reflex microscopic - Urine Culture - ciprofloxacin (CIPRO) 500 MG tablet; Take 1 tablet (500 mg total) by mouth 2 (two) times daily.  Dispense: 14 tablet; Refill: 0   Quenton Recendez [facility] 09/13/2016 1:35 PM

## 2016-09-13 NOTE — Telephone Encounter (Signed)
I have not seen any results of the urine culture which I had asked to be ordered on July 19. Please schedule for an appointment to obtain a new urine specimen for urinalysis and culture.

## 2016-09-14 LAB — URINALYSIS, ROUTINE W REFLEX MICROSCOPIC
BILIRUBIN URINE: NEGATIVE
Glucose, UA: NEGATIVE
Hgb urine dipstick: NEGATIVE
Ketones, ur: NEGATIVE
Nitrite: NEGATIVE
Specific Gravity, Urine: 1.019 (ref 1.001–1.035)
pH: 7.5 (ref 5.0–8.0)

## 2016-09-14 LAB — URINALYSIS, MICROSCOPIC ONLY
Crystals: NONE SEEN [HPF]
RBC / HPF: NONE SEEN RBC/HPF (ref <=2)
SQUAMOUS EPITHELIAL / LPF: NONE SEEN [HPF] (ref <=5)
Yeast: NONE SEEN [HPF]

## 2016-09-16 LAB — URINE CULTURE

## 2016-09-24 ENCOUNTER — Other Ambulatory Visit: Payer: Self-pay | Admitting: Family Medicine

## 2016-09-24 ENCOUNTER — Encounter: Payer: Self-pay | Admitting: Family Medicine

## 2016-09-24 DIAGNOSIS — N3000 Acute cystitis without hematuria: Secondary | ICD-10-CM

## 2016-09-24 DIAGNOSIS — N390 Urinary tract infection, site not specified: Secondary | ICD-10-CM | POA: Insufficient documentation

## 2016-09-24 MED ORDER — NITROFURANTOIN MONOHYD MACRO 100 MG PO CAPS: 100.0000 mg | ORAL_CAPSULE | Freq: Two times a day (BID) | ORAL | 0 refills | Status: AC

## 2016-09-27 ENCOUNTER — Inpatient Hospital Stay: Payer: Medicare HMO | Attending: Oncology

## 2016-10-06 DIAGNOSIS — J449 Chronic obstructive pulmonary disease, unspecified: Secondary | ICD-10-CM | POA: Diagnosis not present

## 2016-10-17 ENCOUNTER — Telehealth: Payer: Self-pay | Admitting: Family Medicine

## 2016-10-17 ENCOUNTER — Other Ambulatory Visit: Payer: Self-pay | Admitting: Family Medicine

## 2016-10-17 DIAGNOSIS — F5104 Psychophysiologic insomnia: Secondary | ICD-10-CM

## 2016-10-17 MED ORDER — TRAZODONE HCL 100 MG PO TABS: 100.0000 mg | ORAL_TABLET | Freq: Every day | ORAL | 0 refills | Status: DC

## 2016-10-17 NOTE — Telephone Encounter (Signed)
Requesting a return call from Dr. Manuella Ghazi to discuss Trazadone. He want to discuss upping the dosage on this medication because he feels as though it isn't hardly working

## 2016-10-17 NOTE — Telephone Encounter (Signed)
Discussed with patient reports that the medication is not working for his symptoms of insomnia, some nights he takes 2 tablets of trazodone 50 mg. I have advised to increase the dosage to 100 mg every night, recheck in 30 days.

## 2016-10-17 NOTE — Telephone Encounter (Signed)
Dr. Manuella Ghazi, pt is requesting a return call from Dr. Manuella Ghazi to discuss Trazadone. He want to discuss upping the dosage on this medication because he feels as though it isn't hardly working

## 2016-10-18 ENCOUNTER — Other Ambulatory Visit: Payer: Self-pay | Admitting: Family Medicine

## 2016-10-18 DIAGNOSIS — F5104 Psychophysiologic insomnia: Secondary | ICD-10-CM

## 2016-10-25 ENCOUNTER — Inpatient Hospital Stay: Payer: Medicare HMO | Attending: Oncology

## 2016-11-01 ENCOUNTER — Ambulatory Visit (INDEPENDENT_AMBULATORY_CARE_PROVIDER_SITE_OTHER): Payer: Medicare HMO | Admitting: Family Medicine

## 2016-11-01 ENCOUNTER — Encounter: Payer: Self-pay | Admitting: Family Medicine

## 2016-11-01 VITALS — BP 110/62 | HR 73 | Temp 97.4°F | Resp 16 | Ht 67.0 in | Wt 121.4 lb

## 2016-11-01 DIAGNOSIS — F5104 Psychophysiologic insomnia: Secondary | ICD-10-CM

## 2016-11-01 DIAGNOSIS — J449 Chronic obstructive pulmonary disease, unspecified: Secondary | ICD-10-CM

## 2016-11-01 MED ORDER — TRAZODONE HCL 100 MG PO TABS: 100.0000 mg | ORAL_TABLET | Freq: Every day | ORAL | 0 refills | Status: DC

## 2016-11-01 MED ORDER — ALBUTEROL SULFATE HFA 108 (90 BASE) MCG/ACT IN AERS: 2.0000 | INHALATION_SPRAY | Freq: Four times a day (QID) | RESPIRATORY_TRACT | 2 refills | Status: DC | PRN

## 2016-11-01 NOTE — Progress Notes (Signed)
Name: Collin Gonzales   MRN: [id number]    DOB: [birth year]   Date:11/01/2016       Progress Note  Subjective  Chief Complaint  Chief Complaint  Patient presents with  . Medication Refill    Trazodone, and abluterol   . Hyperlipidemia    Insomnia  Primary symptoms: no sleep disturbance, no difficulty falling asleep, no premature morning awakening.  The onset quality is gradual. Past treatments include medication. Typical bedtime:  11-12 P.M..  How long after going to bed to you fall asleep: 15-30 minutes.     Patient is also requesting a refill for Albuterol inhaler for COPD, he is being managed by Pulmonologist on Spiriva, and Adviar, and Albuterol, he is on home oxygen. He seems to be doing reasonably well, has occasional shortness of breath and coughing.    Past Medical History:  Diagnosis Date  . Arthritis    rheumatoid arthritis  . Cancer ([place])   . Collagen vascular disease ([place])   . COPD (chronic obstructive pulmonary disease) ([place])   . GERD (gastroesophageal reflux disease)   . History of prostate cancer   . HOH (hard of hearing)   . Insomnia   . Lung cancer ([place])   . Oxygen deficit    2L HS AND PRN  . Stroke ([place])    2000  . Wheezing     Past Surgical History:  Procedure Laterality Date  . APPENDECTOMY  1965  . CATARACT EXTRACTION W/PHACO Right 01/05/2016   Procedure: CATARACT EXTRACTION PHACO AND INTRAOCULAR LENS PLACEMENT (IOC);  Surgeon: Collin Robson, MD;  Location: [facility] ORS;  Service: Ophthalmology;  Laterality: Right;  [place] 1.14 AP% 18.1 CDE 13.44 FLUID PACK LOT # [id number] H  . ENDOBRONCHIAL ULTRASOUND N/A 02/26/2015   Procedure: ENDOBRONCHIAL ULTRASOUND;  Surgeon: Collin Lipps, MD;  Location: [facility] ORS;  Service: Cardiopulmonary;  Laterality: N/A;  . HERNIA REPAIR  1965    Family History  Problem Relation Age of Onset  . Cancer Mother   . Cancer Father        Lung Cancer  . Cancer Sister   . Cancer Brother     Social History   Social History   . Marital status: Single    Spouse name: N/A  . Number of children: N/A  . Years of education: N/A   Occupational History  . Not on file.   Social History Main Topics  . Smoking status: Former Smoker    Quit date: 02/21/2013  . Smokeless tobacco: Never Used  . Alcohol use No  . Drug use: No  . Sexual activity: Not on file   Other Topics Concern  . Not on file   Social History Narrative  . No narrative on file     Current Outpatient Prescriptions:  .  albuterol (PROAIR HFA) 108 (90 BASE) MCG/ACT inhaler, Inhale 2 puffs into the lungs every 4 (four) hours as needed for wheezing. , Disp: , Rfl:  .  aspirin EC 81 MG tablet, Take 81 mg by mouth daily., Disp: , Rfl:  .  atorvastatin (LIPITOR) 10 MG tablet, TAKE 1 TABLET (10 MG TOTAL) BY MOUTH DAILY AT 6 PM., Disp: 90 tablet, Rfl: 0 .  B Complex-C (B-COMPLEX WITH VITAMIN C) tablet, Take 1 tablet by mouth daily., Disp: , Rfl:  .  Fluticasone-Salmeterol (ADVAIR DISKUS) 250-50 MCG/DOSE AEPB, Inhale 1 puff into the lungs every morning. , Disp: , Rfl:  .  folic acid (FOLVITE) 1 MG tablet, Take 1 tablet (1 mg  total) by mouth daily., Disp: 30 tablet, Rfl: 5 .  methotrexate 2.5 MG tablet, Take 20 mg by mouth every Tuesday. With Supper, Disp: , Rfl:  .  traZODone (DESYREL) 100 MG tablet, Take 1 tablet (100 mg total) by mouth at bedtime., Disp: 30 tablet, Rfl: 0 .  mirtazapine (REMERON) 15 MG tablet, Take 1 tablet (15 mg total) by mouth at bedtime. (Patient not taking: Reported on 11/01/2016), Disp: 90 tablet, Rfl: 0 .  omeprazole (PRILOSEC) 40 MG capsule, Take 1 capsule (40 mg total) by mouth daily. (Patient not taking: Reported on 11/01/2016), Disp: 90 capsule, Rfl: 1 .  tiotropium (SPIRIVA HANDIHALER) 18 MCG inhalation capsule, Place into inhaler and inhale 2 (two) times daily. , Disp: , Rfl:   Allergies  Allergen Reactions  . Plaquenil [Hydroxychloroquine]   . Simvastatin Other (See Comments)    Bad dreams     Review of Systems   Psychiatric/Behavioral: Negative for sleep disturbance. The patient has insomnia.       Objective  Vitals:   11/01/16 0940  BP: 110/62  Pulse: 73  Resp: 16  Temp: (!) 97.4 F (36.3 C)  TempSrc: Oral  SpO2: 93%  Weight: 121 lb 6.4 oz (55.1 kg)  Height: 5\' 7"  (1.702 m)    Physical Exam  Constitutional: He is oriented to person, place, and time and well-developed, well-nourished, and in no distress.  HENT:  Head: Normocephalic and atraumatic.  Cardiovascular: Normal rate, regular rhythm and normal heart sounds.   No murmur heard. Pulmonary/Chest: Effort normal and breath sounds normal. He has no wheezes.  Neurological: He is alert and oriented to person, place, and time.  Psychiatric: Mood, memory, affect and judgment normal.  Nursing note and vitals reviewed.    Assessment & Plan  1. Chronic insomnia Stable and responsive to trazodone taken every night - traZODone (DESYREL) 100 MG tablet; Take 1 tablet (100 mg total) by mouth at bedtime.  Dispense: 90 tablet; Refill: 0  2. Chronic obstructive pulmonary disease, unspecified COPD type ([place]) Patient follows with pulmonology, refill for albuterol provided for rescue treatment - albuterol (PROAIR HFA) 108 (90 Base) MCG/ACT inhaler; Inhale 2 puffs into the lungs every 6 (six) hours as needed for wheezing.  Dispense: 3 Inhaler; Refill: 2   Collin Gonzales 11/01/2016 9:48 AM

## 2016-11-02 DIAGNOSIS — R700 Elevated erythrocyte sedimentation rate: Secondary | ICD-10-CM | POA: Diagnosis not present

## 2016-11-02 DIAGNOSIS — D638 Anemia in other chronic diseases classified elsewhere: Secondary | ICD-10-CM | POA: Diagnosis not present

## 2016-11-02 DIAGNOSIS — R938 Abnormal findings on diagnostic imaging of other specified body structures: Secondary | ICD-10-CM | POA: Diagnosis not present

## 2016-11-02 DIAGNOSIS — M0579 Rheumatoid arthritis with rheumatoid factor of multiple sites without organ or systems involvement: Secondary | ICD-10-CM | POA: Diagnosis not present

## 2016-11-02 DIAGNOSIS — Z79899 Other long term (current) drug therapy: Secondary | ICD-10-CM | POA: Diagnosis not present

## 2016-11-06 DIAGNOSIS — J449 Chronic obstructive pulmonary disease, unspecified: Secondary | ICD-10-CM | POA: Diagnosis not present

## 2016-11-20 NOTE — Progress Notes (Signed)
[facility]  [phone] [phone]) [phone]  ID: [name]: [birth year]  MR#: [id number]  IDP#:[id number]  Patient Care Team: Roselee Nova, MD as PCP - General (Family Medicine) [name], MD as Referring Physician (Ophthalmology) [name], MD as Consulting Physician (Rheumatology) [name], MD as Referring Physician (Specialist) Royston Cowper, MD as Consulting Physician (Urology)  CHIEF COMPLAINT: Pancytopenia.  INTERVAL HISTORY: Patient returns to clinic today for repeat laboratory work and further evaluation. He has chronic shortness of breath, but otherwise feels well and is asymptomatic. He has no neurologic complaints. He denies any recent fevers or illnesses. He has a good appetite and denies weight loss. He has no chest pain, cough, or hemoptysis. He denies any nausea, vomiting, constipation, or diarrhea. He has no urinary complaints. Patient feels at his baseline and offers no specific complaints today.  REVIEW OF SYSTEMS:   Review of Systems  Constitutional: Negative.  Negative for fever, malaise/fatigue and weight loss.  Respiratory: Positive for shortness of breath. Negative for cough and hemoptysis.   Cardiovascular: Negative.  Negative for chest pain and leg swelling.  Gastrointestinal: Negative.  Negative for abdominal pain, blood in stool and melena.  Genitourinary: Negative.   Musculoskeletal: Negative.   Skin: Negative.  Negative for rash.  Neurological: Negative.  Negative for sensory change and weakness.  Psychiatric/Behavioral: Negative.  The patient is not nervous/anxious.     As per HPI. Otherwise, a complete review of systems is negative.  PAST MEDICAL HISTORY: Past Medical History:  Diagnosis Date  . Arthritis    rheumatoid arthritis  . Cancer ([place])   . Collagen vascular disease ([place])   . COPD (chronic obstructive pulmonary disease) ([place])   . GERD (gastroesophageal reflux  disease)   . History of prostate cancer   . HOH (hard of hearing)   . Insomnia   . Lung cancer ([place])   . Oxygen deficit    2L HS AND PRN  . Stroke ([place])    2000  . Wheezing     PAST SURGICAL HISTORY: Past Surgical History:  Procedure Laterality Date  . APPENDECTOMY  1965  . CATARACT EXTRACTION W/PHACO Right 01/05/2016   Procedure: CATARACT EXTRACTION PHACO AND INTRAOCULAR LENS PLACEMENT (IOC);  Surgeon: [name], MD;  Location: [facility] ORS;  Service: Ophthalmology;  Laterality: Right;  [place] 1.14 AP% 18.1 CDE 13.44 FLUID PACK LOT # [id number] H  . ENDOBRONCHIAL ULTRASOUND N/A 02/26/2015   Procedure: ENDOBRONCHIAL ULTRASOUND;  Surgeon: Flora Lipps, MD;  Location: [facility] ORS;  Service: Cardiopulmonary;  Laterality: N/A;  . HERNIA REPAIR  1965    FAMILY HISTORY: Family History  Problem Relation Age of Onset  . Cancer Mother   . Cancer Father        Lung Cancer  . Cancer Sister   . Cancer Brother     ADVANCED DIRECTIVES (Y/N):  N  HEALTH MAINTENANCE: Social History  Substance Use Topics  . Smoking status: Former Smoker    Quit date: 02/21/2013  . Smokeless tobacco: Never Used  . Alcohol use No     Colonoscopy:  PAP:  Bone density:  Lipid panel:  Allergies  Allergen Reactions  . Plaquenil [Hydroxychloroquine]   . Simvastatin Other (See Comments)    Bad dreams    Current Outpatient Prescriptions  Medication Sig Dispense Refill  . albuterol (PROAIR HFA) 108 (90 Base) MCG/ACT inhaler Inhale 2 puffs into the lungs every 6 (six) hours as needed for wheezing. 3 Inhaler  2  . aspirin EC 81 MG tablet Take 81 mg by mouth daily.    Marland Kitchen atorvastatin (LIPITOR) 10 MG tablet TAKE 1 TABLET (10 MG TOTAL) BY MOUTH DAILY AT 6 PM. 90 tablet 0  . B Complex-C (B-COMPLEX WITH VITAMIN C) tablet Take 1 tablet by mouth daily.    . Fluticasone-Salmeterol (ADVAIR DISKUS) 250-50 MCG/DOSE AEPB Inhale 1 puff into the lungs every morning.     . folic acid (FOLVITE) 1 MG tablet Take 1 tablet (1 mg  total) by mouth daily. 30 tablet 5  . methotrexate 2.5 MG tablet Take 20 mg by mouth every Tuesday. With Supper    . mirtazapine (REMERON) 15 MG tablet Take 1 tablet (15 mg total) by mouth at bedtime. 90 tablet 0  . omeprazole (PRILOSEC) 40 MG capsule Take 1 capsule (40 mg total) by mouth daily. 90 capsule 1  . tiotropium (SPIRIVA HANDIHALER) 18 MCG inhalation capsule Place into inhaler and inhale 2 (two) times daily.     . traZODone (DESYREL) 100 MG tablet Take 1 tablet (100 mg total) by mouth at bedtime. 90 tablet 0   No current facility-administered medications for this visit.     OBJECTIVE: Vitals:   11/22/16 1412  BP: 115/66  Pulse: 83  Temp: (!) 96.5 F (35.8 C)     Body mass index is 19.22 kg/m.    ECOG FS:0 - Asymptomatic  General: Well-developed, well-nourished, no acute distress. Eyes: Pink conjunctiva, anicteric sclera. Lungs: Clear to auscultation bilaterally. Heart: Regular rate and rhythm. No rubs, murmurs, or gallops. Abdomen: Soft, nontender, nondistended. No organomegaly noted, normoactive bowel sounds. Musculoskeletal: No edema, cyanosis, or clubbing. Neuro: Alert, answering all questions appropriately. Cranial nerves grossly intact. Skin: No rashes or petechiae noted. Psych: Normal affect.  LAB RESULTS:  Lab Results  Component Value Date   NA 139 07/29/2016   K 3.7 07/29/2016   CL 101 07/29/2016   CO2 30 07/29/2016   GLUCOSE 90 07/29/2016   BUN 20 07/29/2016   CREATININE 1.06 07/29/2016   CALCIUM 9.1 07/29/2016   PROT 8.6 (H) 07/29/2016   ALBUMIN 3.2 (L) 07/29/2016   AST 22 07/29/2016   ALT 13 (L) 07/29/2016   ALKPHOS 76 07/29/2016   BILITOT 0.5 07/29/2016   GFRNONAA >60 07/29/2016   GFRAA >60 07/29/2016    Lab Results  Component Value Date   WBC 5.7 11/22/2016   NEUTROABS 3.8 11/22/2016   HGB 11.3 (L) 11/22/2016   HCT 33.8 (L) 11/22/2016   MCV 95.4 11/22/2016   PLT 240 11/22/2016     STUDIES: No results found.  ASSESSMENT:  Pancytopenia  PLAN:    1. Pancytopenia: Patient's white blood cell count and platelet count continue to be within normal limits. Unclear etiology of patient's transient drop. He continues to have a mild anemia and was noted to have a mild iron deficiency. He also has a decreased folate as well as B-12 levels. Previously noted to have heme-positive stools and recommend a referral to GI for further evaluation. Continue folate and oral B-complex vitamin. Have also recommended patient initiate oral iron supplementation. No intervention is needed at this time. Patient does not require any further follow-up. Please refer him back if there are any questions or concerns. 2. Right upper lobe mass: Although biopsy was negative, patient had a positive PET scan that was highly suspicious for underlying malignancy. He underwent SBRT in approximately May 2017. Repeat CT scan on July 25, 2016 revealed no residual or progressive disease. Continue  monitoring and follow-up per radiation oncology. 3. Shortness of breath: Continue oxygen as needed.  Approximately 20 minutes was spent in discussion of which greater than 50 was consultation.  Patient expressed understanding and was in agreement with this plan. He also understands that He can call clinic at any time with any questions, concerns, or complaints.    Lloyd Huger, MD   11/22/2016 2:38 PM

## 2016-11-21 ENCOUNTER — Other Ambulatory Visit: Payer: Self-pay | Admitting: Oncology

## 2016-11-21 DIAGNOSIS — D509 Iron deficiency anemia, unspecified: Secondary | ICD-10-CM

## 2016-11-22 ENCOUNTER — Ambulatory Visit: Payer: Medicare HMO | Admitting: Oncology

## 2016-11-22 ENCOUNTER — Inpatient Hospital Stay (HOSPITAL_BASED_OUTPATIENT_CLINIC_OR_DEPARTMENT_OTHER): Payer: Medicare HMO | Admitting: Oncology

## 2016-11-22 ENCOUNTER — Inpatient Hospital Stay: Payer: Medicare HMO | Attending: Oncology

## 2016-11-22 ENCOUNTER — Encounter: Payer: Self-pay | Admitting: Oncology

## 2016-11-22 ENCOUNTER — Other Ambulatory Visit: Payer: Medicare HMO

## 2016-11-22 ENCOUNTER — Inpatient Hospital Stay: Payer: Medicare HMO

## 2016-11-22 VITALS — BP 115/66 | HR 83 | Temp 96.5°F | Wt 122.7 lb

## 2016-11-22 DIAGNOSIS — R918 Other nonspecific abnormal finding of lung field: Secondary | ICD-10-CM

## 2016-11-22 DIAGNOSIS — K219 Gastro-esophageal reflux disease without esophagitis: Secondary | ICD-10-CM | POA: Diagnosis not present

## 2016-11-22 DIAGNOSIS — D61818 Other pancytopenia: Secondary | ICD-10-CM | POA: Diagnosis not present

## 2016-11-22 DIAGNOSIS — Z801 Family history of malignant neoplasm of trachea, bronchus and lung: Secondary | ICD-10-CM | POA: Diagnosis not present

## 2016-11-22 DIAGNOSIS — Z9981 Dependence on supplemental oxygen: Secondary | ICD-10-CM

## 2016-11-22 DIAGNOSIS — Z79899 Other long term (current) drug therapy: Secondary | ICD-10-CM

## 2016-11-22 DIAGNOSIS — Z87891 Personal history of nicotine dependence: Secondary | ICD-10-CM | POA: Diagnosis not present

## 2016-11-22 DIAGNOSIS — J449 Chronic obstructive pulmonary disease, unspecified: Secondary | ICD-10-CM | POA: Insufficient documentation

## 2016-11-22 DIAGNOSIS — Z8546 Personal history of malignant neoplasm of prostate: Secondary | ICD-10-CM

## 2016-11-22 DIAGNOSIS — Z85118 Personal history of other malignant neoplasm of bronchus and lung: Secondary | ICD-10-CM | POA: Insufficient documentation

## 2016-11-22 DIAGNOSIS — Z8673 Personal history of transient ischemic attack (TIA), and cerebral infarction without residual deficits: Secondary | ICD-10-CM

## 2016-11-22 DIAGNOSIS — Z809 Family history of malignant neoplasm, unspecified: Secondary | ICD-10-CM | POA: Diagnosis not present

## 2016-11-22 DIAGNOSIS — D509 Iron deficiency anemia, unspecified: Secondary | ICD-10-CM | POA: Diagnosis not present

## 2016-11-22 DIAGNOSIS — M069 Rheumatoid arthritis, unspecified: Secondary | ICD-10-CM | POA: Diagnosis not present

## 2016-11-22 DIAGNOSIS — Z7982 Long term (current) use of aspirin: Secondary | ICD-10-CM | POA: Insufficient documentation

## 2016-11-22 DIAGNOSIS — D500 Iron deficiency anemia secondary to blood loss (chronic): Secondary | ICD-10-CM

## 2016-11-22 LAB — CBC WITH DIFFERENTIAL/PLATELET
BASOS PCT: 1 %
Basophils Absolute: 0.1 10*3/uL (ref 0–0.1)
Eosinophils Absolute: 0.1 10*3/uL (ref 0–0.7)
Eosinophils Relative: 2 %
HEMATOCRIT: 33.8 % — AB (ref 40.0–52.0)
HEMOGLOBIN: 11.3 g/dL — AB (ref 13.0–18.0)
LYMPHS ABS: 1 10*3/uL (ref 1.0–3.6)
Lymphocytes Relative: 17 %
MCH: 31.9 pg (ref 26.0–34.0)
MCHC: 33.4 g/dL (ref 32.0–36.0)
MCV: 95.4 fL (ref 80.0–100.0)
Monocytes Absolute: 0.7 10*3/uL (ref 0.2–1.0)
Monocytes Relative: 13 %
NEUTROS ABS: 3.8 10*3/uL (ref 1.4–6.5)
Neutrophils Relative %: 67 %
Platelets: 240 10*3/uL (ref 150–440)
RBC: 3.55 MIL/uL — AB (ref 4.40–5.90)
RDW: 17.6 % — ABNORMAL HIGH (ref 11.5–14.5)
WBC: 5.7 10*3/uL (ref 3.8–10.6)

## 2016-11-22 LAB — VITAMIN B12: VITAMIN B 12: 183 pg/mL (ref 180–914)

## 2016-11-22 LAB — IRON AND TIBC
Iron: 62 ug/dL (ref 45–182)
SATURATION RATIOS: 20 % (ref 17.9–39.5)
TIBC: 306 ug/dL (ref 250–450)
UIBC: 245 ug/dL

## 2016-11-22 LAB — FERRITIN: Ferritin: 27 ng/mL (ref 24–336)

## 2016-11-22 LAB — FOLATE: FOLATE: 8.5 ng/mL (ref >5.9)

## 2016-12-06 DIAGNOSIS — J449 Chronic obstructive pulmonary disease, unspecified: Secondary | ICD-10-CM | POA: Diagnosis not present

## 2016-12-25 ENCOUNTER — Other Ambulatory Visit: Payer: Self-pay | Admitting: Family Medicine

## 2016-12-25 DIAGNOSIS — Z9189 Other specified personal risk factors, not elsewhere classified: Secondary | ICD-10-CM

## 2016-12-29 ENCOUNTER — Ambulatory Visit: Payer: Medicare HMO | Admitting: Family Medicine

## 2016-12-29 ENCOUNTER — Encounter: Payer: Self-pay | Admitting: Family Medicine

## 2016-12-29 VITALS — BP 130/80 | HR 87 | Temp 98.0°F | Resp 18 | Ht 67.0 in | Wt 122.0 lb

## 2016-12-29 DIAGNOSIS — Z8546 Personal history of malignant neoplasm of prostate: Secondary | ICD-10-CM | POA: Diagnosis not present

## 2016-12-29 DIAGNOSIS — R350 Frequency of micturition: Secondary | ICD-10-CM

## 2016-12-29 DIAGNOSIS — R300 Dysuria: Secondary | ICD-10-CM | POA: Diagnosis not present

## 2016-12-29 MED ORDER — DOXYCYCLINE HYCLATE 100 MG PO TABS: 100.0000 mg | ORAL_TABLET | Freq: Two times a day (BID) | ORAL | 0 refills | Status: AC

## 2016-12-29 NOTE — Progress Notes (Signed)
Name: Collin Gonzales   MRN: [id number]    DOB: 10/30/1935   Date:12/29/2016       Progress Note  Subjective  Chief Complaint  Chief Complaint  Patient presents with  . Urinary Tract Infection    burning when urinating, has been through 2 rounds of antibiotics that did not help    HPI  Patient presents with dysuria and frequency since July.  He saw Dr. Manuella Ghazi, PCP, for cystitis September 13, 2016, and was give Cipro, upon return of culture, he was switched to Macrobid due to resistance for Cipro.  Endorses foul-smelling and cloudy urine. Denies abdominal pain, back/flank pain, history of kidney stones, NVD, frank hematuria, or very dark urine. Pt has history of prostate cancer and follows up with Dr. Yves Dill ([place]) annually now.  - Unable to locate records, patient will sign release to obtain these records.   Patient Active Problem List   Diagnosis Date Noted  . UTI (urinary tract infection) 09/24/2016  . Absolute anemia 09/06/2016  . Cataract 09/06/2016  . Abnormal chest x-ray 09/06/2016  . Drug-induced folate deficiency anemia 09/06/2016  . Esophagitis, reflux 09/06/2016  . Hypogammaglobulinemia ([place]) 09/06/2016  . Decreased leukocytes 09/06/2016  . Pneumococcal vaccination given 09/06/2016  . Iron deficiency anemia due to chronic blood loss 08/23/2016  . Pancytopenia ([place]) 08/22/2016  . History of stroke with residual effects 06/06/2016  . Productive cough 02/29/2016  . History of prostate cancer 09/07/2015  . Acute colitis 09/01/2015  . Bloody diarrhea 09/01/2015  . Decrease in appetite 09/01/2015  . Colitis 08/18/2015  . GERD (gastroesophageal reflux disease) 08/05/2015  . Adenopathy   . Candidate for statin therapy due to risk of future cardiovascular event 12/08/2014  . Elevated serum globulin level 12/08/2014  . Annual physical exam 11/21/2014  . COPD (chronic obstructive pulmonary disease) ([place]) 08/20/2014  . Dyslipidemia 08/20/2014  . Acid reflux  08/20/2014  . Cerebrovascular accident, old 08/20/2014  . Personal history of malignant neoplasm of prostate 08/20/2014  . Chronic insomnia 08/20/2014  . Lung mass 08/20/2014  . Arthritis or polyarthritis, rheumatoid ([place]) 08/20/2014  . Prostate cancer ([place]) 11/01/2013  . Flutter-fibrillation 03/18/2005    Social History   Tobacco Use  . Smoking status: Former Smoker    Last attempt to quit: 02/21/2013    Years since quitting: 3.8  . Smokeless tobacco: Never Used  Substance Use Topics  . Alcohol use: No    Alcohol/week: 0.0 oz     Current Outpatient Medications:  .  albuterol (PROAIR HFA) 108 (90 Base) MCG/ACT inhaler, Inhale 2 puffs into the lungs every 6 (six) hours as needed for wheezing., Disp: 3 Inhaler, Rfl: 2 .  aspirin EC 81 MG tablet, Take 81 mg by mouth daily., Disp: , Rfl:  .  atorvastatin (LIPITOR) 10 MG tablet, TAKE 1 TABLET (10 MG TOTAL) BY MOUTH DAILY AT 6 PM., Disp: 90 tablet, Rfl: 0 .  B Complex-C (B-COMPLEX WITH VITAMIN C) tablet, Take 1 tablet by mouth daily., Disp: , Rfl:  .  Fluticasone-Salmeterol (ADVAIR DISKUS) 250-50 MCG/DOSE AEPB, Inhale 1 puff into the lungs every morning. , Disp: , Rfl:  .  folic acid (FOLVITE) 1 MG tablet, Take 1 tablet (1 mg total) by mouth daily., Disp: 30 tablet, Rfl: 5 .  methotrexate 2.5 MG tablet, Take 20 mg by mouth every Tuesday. With Supper, Disp: , Rfl:  .  mirtazapine (REMERON) 15 MG tablet, Take 1 tablet (15 mg total) by mouth at bedtime., Disp: 90  tablet, Rfl: 0 .  omeprazole (PRILOSEC) 40 MG capsule, Take 1 capsule (40 mg total) by mouth daily., Disp: 90 capsule, Rfl: 1 .  tiotropium (SPIRIVA HANDIHALER) 18 MCG inhalation capsule, Place into inhaler and inhale 2 (two) times daily. , Disp: , Rfl:  .  traZODone (DESYREL) 100 MG tablet, Take 1 tablet (100 mg total) by mouth at bedtime., Disp: 90 tablet, Rfl: 0  Allergies  Allergen Reactions  . Plaquenil [Hydroxychloroquine]   . Simvastatin Other (See Comments)    Bad dreams     ROS Constitutional: Negative for fever or weight change.  Respiratory: Negative for cough and shortness of breath.   Cardiovascular: Negative for chest pain or palpitations.  Gastrointestinal: Negative for abdominal pain, no bowel changes.  GU: See HPI Musculoskeletal: Negative for gait problem or joint swelling.  Skin: Negative for rash.  Neurological: Negative for dizziness or headache.  No other specific complaints in a complete review of systems (except as listed in HPI above).  Objective  Vitals:   12/29/16 1503  BP: 130/80  Pulse: 87  Resp: 18  Temp: 98 F (36.7 C)  TempSrc: Oral  SpO2: 94%  Weight: 122 lb (55.3 kg)  Height: 5\' 7"  (1.702 m)   Body mass index is 19.11 kg/m.  Nursing Note and Gonzales Signs reviewed.  Physical Exam  Constitutional: Patient appears well-developed and well-nourished.  No distress.  HEENT: head atraumatic, normocephalic Cardiovascular: Normal rate, regular rhythm, S1/S2 present.  No murmur or rub heard. No BLE edema. Pulmonary/Chest: Effort normal and breath sounds clear. No respiratory distress or retractions. Abdominal: Soft and non-tender, bowel sounds present x4 quadrants.  No CVA Tenderness Psychiatric: Patient has a normal mood and affect. behavior is normal. Judgment and thought content normal.  Recent Results (from the past 2160 hour(s))  Vitamin B12     Status: None   Collection Time: 11/22/16  2:02 PM  Result Value Ref Range   Vitamin B-12 183 180 - 914 pg/mL    Comment: (NOTE) This assay is not validated for testing neonatal or myeloproliferative syndrome specimens for Vitamin B12 levels. Performed at [facility], [place] [address], [place], [place] [address]   Folate     Status: None   Collection Time: 11/22/16  2:02 PM  Result Value Ref Range   Folate 8.5 >5.9 ng/mL  Ferritin     Status: None   Collection Time: 11/22/16  2:02 PM  Result Value Ref Range   Ferritin 27 24 - 336 ng/mL  Iron and TIBC      Status: None   Collection Time: 11/22/16  2:02 PM  Result Value Ref Range   Iron 62 45 - 182 ug/dL   TIBC 306 250 - 450 ug/dL   Saturation Ratios 20 17.9 - 39.5 %   UIBC 245 ug/dL  CBC with Differential     Status: Abnormal   Collection Time: 11/22/16  2:02 PM  Result Value Ref Range   WBC 5.7 3.8 - 10.6 K/uL   RBC 3.55 (L) 4.40 - 5.90 MIL/uL   Hemoglobin 11.3 (L) 13.0 - 18.0 g/dL   HCT 33.8 (L) 40.0 - 52.0 %   MCV 95.4 80.0 - 100.0 fL   MCH 31.9 26.0 - 34.0 pg   MCHC 33.4 32.0 - 36.0 g/dL   RDW 17.6 (H) 11.5 - 14.5 %   Platelets 240 150 - 440 K/uL   Neutrophils Relative % 67 %   Neutro Abs 3.8 1.4 - 6.5 K/uL  Lymphocytes Relative 17 %   Lymphs Abs 1.0 1.0 - 3.6 K/uL   Monocytes Relative 13 %   Monocytes Absolute 0.7 0.2 - 1.0 K/uL   Eosinophils Relative 2 %   Eosinophils Absolute 0.1 0 - 0.7 K/uL   Basophils Relative 1 %   Basophils Absolute 0.1 0 - 0.1 K/uL     Assessment & Plan  1. Difficult or painful urination - Ambulatory referral to Urology - doxycycline (VIBRA-TABS) 100 MG tablet; Take 1 tablet (100 mg total) 2 (two) times daily for 14 days by mouth.  Dispense: 28 tablet; Refill: 0 - Urinalysis - Urine Culture  2. Urinary frequency - Ambulatory referral to Urology - doxycycline (VIBRA-TABS) 100 MG tablet; Take 1 tablet (100 mg total) 2 (two) times daily for 14 days by mouth.  Dispense: 28 tablet; Refill: 0 - Urinalysis - Urine Culture  3. History of prostate cancer - Ambulatory referral to Urology  - Advised that due to symptoms not resolving from July, we will treat empirically based on resistance pattern of previous culture. Pt unable to provide urine sample during visit, and will return with sample when able - will send for UA and culture to ensure appropriate antibiotic has been chosen.  -Red flags and when to present for emergency care or RTC including fever >101.46F, chest pain, shortness of breath, abdominal pain, back pain, hematuria,  new/worsening/un-resolving symptoms, reviewed with patient at time of visit. Follow up and care instructions discussed and provided in AVS.

## 2016-12-29 NOTE — Patient Instructions (Addendum)
Urinary Tract Infection, Adult °A urinary tract infection (UTI) is an infection of any part of the urinary tract. The urinary tract includes the: °· Kidneys. °· Ureters. °· Bladder. °· Urethra. ° °These organs make, store, and get rid of pee (urine) in the body. °Follow these instructions at home: °· Take over-the-counter and prescription medicines only as told by your doctor. °· If you were prescribed an antibiotic medicine, take it as told by your doctor. Do not stop taking the antibiotic even if you start to feel better. °· Avoid the following drinks: °? Alcohol. °? Caffeine. °? Tea. °? Carbonated drinks. °· Drink enough fluid to keep your pee clear or pale yellow. °· Keep all follow-up visits as told by your doctor. This is important. °· Make sure to: °? Empty your bladder often and completely. Do not to hold pee for long periods of time. °? Empty your bladder before and after sex. °? Wipe from front to back after a bowel movement if you are male. Use each tissue one time when you wipe. °Contact a doctor if: °· You have back pain. °· You have a fever. °· You feel sick to your stomach (nauseous). °· You throw up (vomit). °· Your symptoms do not get better after 3 days. °· Your symptoms go away and then come back. °Get help right away if: °· You have very bad back pain. °· You have very bad lower belly (abdominal) pain. °· You are throwing up and cannot keep down any medicines or water. °This information is not intended to replace advice given to you by your health care provider. Make sure you discuss any questions you have with your health care provider. °Document Released: 07/27/2007 Document Revised: 07/16/2015 Document Reviewed: 12/29/2014 °Elsevier Interactive Patient Education © 2018 [employer] ° °

## 2016-12-30 LAB — URINALYSIS
Bilirubin Urine: NEGATIVE
Glucose, UA: NEGATIVE
Ketones, ur: NEGATIVE
NITRITE: NEGATIVE
PH: 6 (ref 5.0–8.0)
Specific Gravity, Urine: 1.03 (ref 1.001–1.03)

## 2016-12-30 LAB — URINE CULTURE
MICRO NUMBER:: 81259889
SPECIMEN QUALITY: ADEQUATE

## 2017-01-02 ENCOUNTER — Ambulatory Visit: Payer: Medicare HMO

## 2017-01-03 ENCOUNTER — Ambulatory Visit (INDEPENDENT_AMBULATORY_CARE_PROVIDER_SITE_OTHER): Payer: Medicare HMO

## 2017-01-03 VITALS — BP 122/60 | HR 82 | Temp 97.1°F | Resp 20 | Ht 67.0 in | Wt 121.5 lb

## 2017-01-03 DIAGNOSIS — Z23 Encounter for immunization: Secondary | ICD-10-CM

## 2017-01-03 DIAGNOSIS — Z0000 Encounter for general adult medical examination without abnormal findings: Secondary | ICD-10-CM | POA: Diagnosis not present

## 2017-01-03 NOTE — Patient Instructions (Signed)
Collin Gonzales , Thank you for taking time to come for your Medicare Wellness Visit. I appreciate your ongoing commitment to your health goals. Please review the following plan we discussed and let me know if I can assist you in the future.   Screening recommendations/referrals: Colonoscopy: Colon cancer screenings no longer required Recommended yearly ophthalmology/optometry visit for glaucoma screening and checkup Recommended yearly dental visit for hygiene and checkup  Vaccinations: Influenza vaccine: Given today Pneumococcal vaccine: Completed series today Tdap vaccine: Declined. Please call your insurance company to determine your out of pocket expense. Shingles vaccine: Declined. Please call your insurance company to determine your out of pocket expense.  Advanced directives: Advance directive discussed with you today. I have provided a copy for you to complete at home and have notarized. Once this is complete please bring a copy in to our office so we can scan it into your chart.  Conditions/risks identified: Drink at least 6-8 8oz glasses of water per day  Next appointment: Please schedule a annual physical with Dr. Manuella Gonzales.   Follow up with the Nurse Health Advisor in one year for your annual wellness exam.   Preventive Care 65 Years and Older, Male Preventive care refers to lifestyle choices and visits with your health care provider that can promote health and wellness. What does preventive care include?  A yearly physical exam. This is also called an annual well check.  Dental exams once or twice a year.  Routine eye exams. Ask your health care provider how often you should have your eyes checked.  Personal lifestyle choices, including:  Daily care of your teeth and gums.  Regular physical activity.  Eating a healthy diet.  Avoiding tobacco and drug use.  Limiting alcohol use.  Practicing safe sex.  Taking low doses of aspirin every day.  Taking vitamin and  mineral supplements as recommended by your health care provider. What happens during an annual well check? The services and screenings done by your health care provider during your annual well check will depend on your age, overall health, lifestyle risk factors, and family history of disease. Counseling  Your health care provider may ask you questions about your:  Alcohol use.  Tobacco use.  Drug use.  Emotional well-being.  Home and relationship well-being.  Sexual activity.  Eating habits.  History of falls.  Memory and ability to understand (cognition).  Work and work Statistician. Screening  You may have the following tests or measurements:  Height, weight, and BMI.  Blood pressure.  Lipid and cholesterol levels. These may be checked every 5 years, or more frequently if you are over 8 years old.  Skin check.  Lung cancer screening. You may have this screening every year starting at age 81 if you have a 30-pack-year history of smoking and currently smoke or have quit within the past 15 years.  Fecal occult blood test (FOBT) of the stool. You may have this test every year starting at age 35.  Flexible sigmoidoscopy or colonoscopy. You may have a sigmoidoscopy every 5 years or a colonoscopy every 10 years starting at age 56.  Prostate cancer screening. Recommendations will vary depending on your family history and other risks.  Hepatitis C blood test.  Hepatitis B blood test.  Sexually transmitted disease (STD) testing.  Diabetes screening. This is done by checking your blood sugar (glucose) after you have not eaten for a while (fasting). You may have this done every 1-3 years.  Abdominal aortic aneurysm (AAA) screening. You  may need this if you are a current or former smoker.  Osteoporosis. You may be screened starting at age 3 if you are at high risk. Talk with your health care provider about your test results, treatment options, and if necessary, the need  for more tests. Vaccines  Your health care provider may recommend certain vaccines, such as:  Influenza vaccine. This is recommended every year.  Tetanus, diphtheria, and acellular pertussis (Tdap, Td) vaccine. You may need a Td booster every 10 years.  Zoster vaccine. You may need this after age 80.  Pneumococcal 13-valent conjugate (PCV13) vaccine. One dose is recommended after age 14.  Pneumococcal polysaccharide (PPSV23) vaccine. One dose is recommended after age 81. Talk to your health care provider about which screenings and vaccines you need and how often you need them. This information is not intended to replace advice given to you by your health care provider. Make sure you discuss any questions you have with your health care provider. Document Released: 03/06/2015 Document Revised: 10/28/2015 Document Reviewed: 12/09/2014 Elsevier Interactive Patient Education  2017 [place] Prevention in the Home Falls can cause injuries. They can happen to people of all ages. There are many things you can do to make your home safe and to help prevent falls. What can I do on the outside of my home?  Regularly fix the edges of walkways and driveways and fix any cracks.  Remove anything that might make you trip as you walk through a door, such as a raised step or threshold.  Trim any bushes or trees on the path to your home.  Use bright outdoor lighting.  Clear any walking paths of anything that might make someone trip, such as rocks or tools.  Regularly check to see if handrails are loose or broken. Make sure that both sides of any steps have handrails.  Any raised decks and porches should have guardrails on the edges.  Have any leaves, snow, or ice cleared regularly.  Use sand or salt on walking paths during winter.  Clean up any spills in your garage right away. This includes oil or grease spills. What can I do in the bathroom?  Use night lights.  Install grab bars  by the toilet and in the tub and shower. Do not use towel bars as grab bars.  Use non-skid mats or decals in the tub or shower.  If you need to sit down in the shower, use a plastic, non-slip stool.  Keep the floor dry. Clean up any water that spills on the floor as soon as it happens.  Remove soap buildup in the tub or shower regularly.  Attach bath mats securely with double-sided non-slip rug tape.  Do not have throw rugs and other things on the floor that can make you trip. What can I do in the bedroom?  Use night lights.  Make sure that you have a light by your bed that is easy to reach.  Do not use any sheets or blankets that are too big for your bed. They should not hang down onto the floor.  Have a firm chair that has side arms. You can use this for support while you get dressed.  Do not have throw rugs and other things on the floor that can make you trip. What can I do in the kitchen?  Clean up any spills right away.  Avoid walking on wet floors.  Keep items that you use a lot in easy-to-reach places.  If you need to reach something above you, use a strong step stool that has a grab bar.  Keep electrical cords out of the way.  Do not use floor polish or wax that makes floors slippery. If you must use wax, use non-skid floor wax.  Do not have throw rugs and other things on the floor that can make you trip. What can I do with my stairs?  Do not leave any items on the stairs.  Make sure that there are handrails on both sides of the stairs and use them. Fix handrails that are broken or loose. Make sure that handrails are as long as the stairways.  Check any carpeting to make sure that it is firmly attached to the stairs. Fix any carpet that is loose or worn.  Avoid having throw rugs at the top or bottom of the stairs. If you do have throw rugs, attach them to the floor with carpet tape.  Make sure that you have a light switch at the top of the stairs and the  bottom of the stairs. If you do not have them, ask someone to add them for you. What else can I do to help prevent falls?  Wear shoes that:  Do not have high heels.  Have rubber bottoms.  Are comfortable and fit you well.  Are closed at the toe. Do not wear sandals.  If you use a stepladder:  Make sure that it is fully opened. Do not climb a closed stepladder.  Make sure that both sides of the stepladder are locked into place.  Ask someone to hold it for you, if possible.  Clearly mark and make sure that you can see:  Any grab bars or handrails.  First and last steps.  Where the edge of each step is.  Use tools that help you move around (mobility aids) if they are needed. These include:  Canes.  Walkers.  Scooters.  Crutches.  Turn on the lights when you go into a dark area. Replace any light bulbs as soon as they burn out.  Set up your furniture so you have a clear path. Avoid moving your furniture around.  If any of your floors are uneven, fix them.  If there are any pets around you, be aware of where they are.  Review your medicines with your doctor. Some medicines can make you feel dizzy. This can increase your chance of falling. Ask your doctor what other things that you can do to help prevent falls. This information is not intended to replace advice given to you by your health care provider. Make sure you discuss any questions you have with your health care provider. Document Released: 12/04/2008 Document Revised: 07/16/2015 Document Reviewed: 03/14/2014 Elsevier Interactive Patient Education  2017 [employer].

## 2017-01-03 NOTE — Progress Notes (Signed)
Subjective:   Collin Gonzales is a 81 y.o. male who presents for Medicare Annual/Subsequent preventive examination.  Review of Systems:  N/A Cardiac Risk Factors include: advanced age (>9men, >33 women);dyslipidemia;sedentary lifestyle     Objective:    Vitals: BP 122/60 (BP Location: Left Arm, Patient Position: Sitting, Cuff Size: Normal)   Pulse 82   Temp (!) 97.1 F (36.2 C) (Oral)   Resp 20   Ht 5\' 7"  (1.702 m)   Wt 121 lb 8 oz (55.1 kg)   BMI 19.03 kg/m   Body mass index is 19.03 kg/m.  Tobacco Social History   Tobacco Use  Smoking Status Former Smoker  . Last attempt to quit: 02/21/2013  . Years since quitting: 3.8  Smokeless Tobacco Never Used     Counseling given: Not Answered   Past Medical History:  Diagnosis Date  . Arthritis    rheumatoid arthritis  . Cancer ([place])   . Collagen vascular disease ([place])   . COPD (chronic obstructive pulmonary disease) ([place])   . GERD (gastroesophageal reflux disease)   . History of prostate cancer   . HOH (hard of hearing)   . Insomnia   . Lung cancer ([place])   . Oxygen deficit    2L HS AND PRN  . Stroke ([place])    2000  . Wheezing    Past Surgical History:  Procedure Laterality Date  . APPENDECTOMY  1965  . HERNIA REPAIR  1965   Family History  Problem Relation Age of Onset  . Cancer Mother   . Cancer Father        Lung Cancer  . Cancer Sister   . Cancer Brother    Social History   Substance and Sexual Activity  Sexual Activity No    Outpatient Encounter Medications as of 01/03/2017  Medication Sig  . albuterol (PROAIR HFA) 108 (90 Base) MCG/ACT inhaler Inhale 2 puffs into the lungs every 6 (six) hours as needed for wheezing.  Marland Kitchen aspirin EC 81 MG tablet Take 81 mg by mouth daily.  . B Complex-C (B-COMPLEX WITH VITAMIN C) tablet Take 1 tablet by mouth daily.  Marland Kitchen doxycycline (VIBRA-TABS) 100 MG tablet Take 1 tablet (100 mg total) 2 (two) times daily for 14 days by mouth.  . Fluticasone-Salmeterol  (ADVAIR DISKUS) 250-50 MCG/DOSE AEPB Inhale 1 puff into the lungs every morning.   . folic acid (FOLVITE) 1 MG tablet Take 1 tablet (1 mg total) by mouth daily.  . methotrexate 2.5 MG tablet Take 20 mg by mouth every Tuesday. With Supper  . omeprazole (PRILOSEC) 40 MG capsule Take 1 capsule (40 mg total) by mouth daily.  Marland Kitchen tiotropium (SPIRIVA HANDIHALER) 18 MCG inhalation capsule Place into inhaler and inhale 2 (two) times daily.   . traZODone (DESYREL) 100 MG tablet Take 1 tablet (100 mg total) by mouth at bedtime.  Marland Kitchen atorvastatin (LIPITOR) 10 MG tablet TAKE 1 TABLET (10 MG TOTAL) BY MOUTH DAILY AT 6 PM. (Patient not taking: Reported on 01/03/2017)  . mirtazapine (REMERON) 15 MG tablet Take 1 tablet (15 mg total) by mouth at bedtime. (Patient not taking: Reported on 01/03/2017)   No facility-administered encounter medications on file as of 01/03/2017.     Activities of Daily Living In your present state of health, do you have any difficulty performing the following activities: 01/03/2017 11/01/2016  Hearing? N Y  [place]? N Y  Comment - -  Difficulty concentrating or making decisions? N N  Walking or climbing stairs? Y N  Comment becomes short of breath -  Dressing or bathing? N N  Doing errands, shopping? N N  Preparing Food and eating ? N -  Using the Toilet? N -  In the past six months, have you accidently leaked urine? Y -  Comment urinary leakage and wears depend -  Do you have problems with loss of bowel control? N -  Managing your Medications? N -  Managing your Finances? N -  Housekeeping or managing your Housekeeping? N -  Some recent data might be hidden    Patient Care Team: Roselee Nova, MD as PCP - General (Family Medicine) [name], MD as Referring Physician (Ophthalmology) [name], MD as Consulting Physician (Rheumatology) [name], MD as Referring Physician (Specialist)   Assessment:     Exercise Activities  and Dietary recommendations Current Exercise Habits: The patient does not participate in regular exercise at present, Exercise limited by: respiratory conditions(s)(COPD)  Goals    None     Fall Risk: TUG test 11 sec. No fall risk intervention required. Fall Risk  01/03/2017 11/01/2016 08/01/2016 06/06/2016 04/06/2016  Falls in the past year? No No No No No  Number falls in past yr: - - - - -  Injury with Fall? - - - - -   Depression Screen PHQ 2/9 Scores 01/03/2017 11/01/2016 08/01/2016 06/06/2016  PHQ - 2 Score 0 0 0 0    Cognitive Function     6CIT Screen 01/03/2017 01/11/2016  What Year? 0 points 0 points  What month? - 0 points  What time? 0 points 0 points  Count back from 20 0 points 0 points  Months in reverse 0 points 0 points  Repeat phrase 0 points 4 points  Total Score - 4    Immunization History  Administered Date(s) Administered  . Influenza, High Dose Seasonal PF 11/21/2014, 01/11/2016, 01/03/2017  . Influenza, Seasonal, Injecte, Preservative Fre 11/08/2010  . Influenza,inj,Quad PF,6+ Mos 12/06/2012  . Pneumococcal Conjugate-13 01/03/2017  . Pneumococcal Polysaccharide-23 12/06/2012   Screening Tests Health Maintenance  Topic Date Due  . TETANUS/TDAP  01/10/2026 (Originally 12/27/1954)  . INFLUENZA VACCINE  Completed  . PNA vac Low Risk Adult  Completed      Plan:   I have personally reviewed and addressed the Medicare Annual Wellness questionnaire and have noted the following in the patient's chart:  A. Medical and social history B. Use of alcohol, tobacco or illicit drugs  C. Current medications and supplements D. Functional ability and status E.  Nutritional status F.  Physical activity G. Advance directives and Code Status: Pt provided with MOST and DNR documents to review and to complete. Advised to return completed documents to our office to ensure proper communication of his/her needs to his/her healthcare team. H. List of other physicians I.    Hospitalizations, surgeries, and ER visits in previous 12 months J.  [place] such as hearing and vision if needed, cognitive and depression L. Referrals and appointments - none  In addition, I have reviewed and discussed with patient certain preventive protocols, quality metrics, and best practice recommendations. A written personalized care plan for preventive services as well as general preventive health recommendations were provided to patient.  See attached scanned questionnaire for additional information.   Signed,  Aleatha Borer, LPN Nurse Health Advisor   Recommendations for Immunizations per CDC guidelines:  Vaccinations: Influenza vaccine: Given today Pneumococcal vaccine: Completed series today  Tdap vaccine: Declined. Pt advised to call his insurance company to determine his out of pocket expense. Also advised he can receive this vaccine at his local pharmacy or Health Dept. Shingles vaccine: Declined. Pt advised to call his insurance company to determine his out of pocket expense. Also advised he can receive this vaccine at his local pharmacy or Health Dept.   Recommendations for Health Maintenance Screenings:  Screenings: Colonoscopy: Colon cancer screenings no longer required due to age Recommended yearly ophthalmology/optometry visit for glaucoma screening and checkup Recommended yearly dental visit for hygiene and checkup

## 2017-01-05 ENCOUNTER — Encounter: Payer: Self-pay | Admitting: Family Medicine

## 2017-01-06 DIAGNOSIS — J449 Chronic obstructive pulmonary disease, unspecified: Secondary | ICD-10-CM | POA: Diagnosis not present

## 2017-01-10 ENCOUNTER — Ambulatory Visit (INDEPENDENT_AMBULATORY_CARE_PROVIDER_SITE_OTHER): Payer: Medicare HMO | Admitting: Urology

## 2017-01-10 DIAGNOSIS — R350 Frequency of micturition: Secondary | ICD-10-CM

## 2017-01-16 NOTE — Progress Notes (Signed)
Note was opened in error.  Due to physician emergency in the operating room, office appointment rescheduled. 

## 2017-01-18 ENCOUNTER — Other Ambulatory Visit: Payer: Self-pay

## 2017-01-18 DIAGNOSIS — Z8673 Personal history of transient ischemic attack (TIA), and cerebral infarction without residual deficits: Secondary | ICD-10-CM | POA: Diagnosis not present

## 2017-01-18 DIAGNOSIS — Z7982 Long term (current) use of aspirin: Secondary | ICD-10-CM | POA: Insufficient documentation

## 2017-01-18 DIAGNOSIS — Z87891 Personal history of nicotine dependence: Secondary | ICD-10-CM | POA: Diagnosis not present

## 2017-01-18 DIAGNOSIS — Z79899 Other long term (current) drug therapy: Secondary | ICD-10-CM | POA: Insufficient documentation

## 2017-01-18 DIAGNOSIS — N390 Urinary tract infection, site not specified: Secondary | ICD-10-CM | POA: Diagnosis not present

## 2017-01-18 DIAGNOSIS — J449 Chronic obstructive pulmonary disease, unspecified: Secondary | ICD-10-CM | POA: Diagnosis not present

## 2017-01-18 DIAGNOSIS — R339 Retention of urine, unspecified: Secondary | ICD-10-CM | POA: Diagnosis not present

## 2017-01-18 NOTE — ED Triage Notes (Signed)
Pt unable to void today.   Last voided  last night.  Pt has hx prostate cancer.  Pt alert.

## 2017-01-19 ENCOUNTER — Ambulatory Visit: Payer: Medicare HMO | Admitting: Urology

## 2017-01-19 ENCOUNTER — Encounter: Payer: Self-pay | Admitting: *Deleted

## 2017-01-19 ENCOUNTER — Emergency Department
Admission: EM | Admit: 2017-01-19 | Discharge: 2017-01-19 | Disposition: A | Discharge status: Home / Self Care | Payer: Medicare HMO | Attending: Emergency Medicine | Admitting: Emergency Medicine

## 2017-01-19 DIAGNOSIS — R8281 Pyuria: Secondary | ICD-10-CM

## 2017-01-19 DIAGNOSIS — R339 Retention of urine, unspecified: Secondary | ICD-10-CM

## 2017-01-19 LAB — URINALYSIS, ROUTINE W REFLEX MICROSCOPIC
BACTERIA UA: NONE SEEN
Bilirubin Urine: NEGATIVE
Glucose, UA: NEGATIVE mg/dL
KETONES UR: NEGATIVE mg/dL
Nitrite: NEGATIVE
PROTEIN: 30 mg/dL — AB
SPECIFIC GRAVITY, URINE: 1.017 (ref 1.005–1.030)
SQUAMOUS EPITHELIAL / LPF: NONE SEEN
pH: 6 (ref 5.0–8.0)

## 2017-01-19 MED ORDER — LIDOCAINE HCL 2 % EX GEL
1.0000 "application " | Freq: Once | CUTANEOUS | Status: AC
  Administered 2017-01-19: 1 via URETHRAL
  Filled 2017-01-19: qty 5

## 2017-01-19 MED ORDER — CEPHALEXIN 500 MG PO CAPS
500.0000 mg | ORAL_CAPSULE | Freq: Once | ORAL | Status: AC
  Administered 2017-01-19: 500 mg via ORAL
  Filled 2017-01-19: qty 1

## 2017-01-19 MED ORDER — CEPHALEXIN 500 MG PO CAPS: 500.0000 mg | ORAL_CAPSULE | Freq: Two times a day (BID) | ORAL | 0 refills | Status: DC

## 2017-01-19 NOTE — Discharge Instructions (Addendum)
You were seen in the Emergency Department (ED) for urinary retention.  We placed a Foley catheter to help your bladder drain.  Please read through the included information and follow up with your doctor as recommended in these papers; your doctor will see you in clinic and help you determine when it is time to have the catheter removed.  If you stop producing urine in the bag or if you develop other symptoms that concern you, such as fever, chills, persistent vomiting, or severe abdominal pain, please return immediately to the Emergency Department.   **At your appointment today, please let your urology provider know that you had white blood cells in your urine (pyuria) but without any bacteria being visible.  Since you have a Foley catheter, we started you on antibiotics (Keflex 500 mg PO BID x 7 days), and there is a urine culture in the lab.  They may want to follow up on these results, change your antibiotic, or stop them altogether.  Please discuss this with your provider at your appointment.**

## 2017-01-19 NOTE — ED Provider Notes (Signed)
[facility] Emergency Department Provider Note  ____________________________________________   First MD Initiated Contact with Patient 01/19/17 0016     (approximate)  I have reviewed the triage vital signs and the nursing notes.   HISTORY  Chief Complaint Urinary Retention    HPI Collin Gonzales is a 81 y.o. male with a complicated medical history that includes prostate cancer status post "seed" treatment.  He has had issues with urinary retention but never required a Foley catheter.  He states that his symptoms started yesterday and gradually getting worse.  He states that he has been going to the bathroom frequently but is only been able to go "a few teaspoons at a time".  By tonight he was completely unable to urinate and he was developing severe pain and the sensation of distention in his lower abdomen.  Nothing made the symptoms better and they were gradually getting worse over time.  He denies any recent fever/chills, nausea, vomiting, flank pain, or dysuria when he is able to go.  He has not seen any gross hematuria.  He reports that in the past he was treated by Dr. Yves Dill with urology but he recently changed and he actually has an appointment later today with a new doctor at [place] (he cannot remember specifically the name of the provider).  He reports that he was treated a few months ago for a urinary tract infection and he was on several different antibiotics but he does not feel that anyone of them in particular made a difference or made him feel any better or worse.  Past Medical History:  Diagnosis Date  . Arthritis    rheumatoid arthritis  . Cancer ([place])   . Collagen vascular disease ([place])   . COPD (chronic obstructive pulmonary disease) ([place])   . GERD (gastroesophageal reflux disease)   . History of prostate cancer   . HOH (hard of hearing)   . Insomnia   . Lung cancer ([place])   . Oxygen deficit    2L HS AND PRN  . Stroke  ([place])    2000  . Wheezing     Patient Active Problem List   Diagnosis Date Noted  . UTI (urinary tract infection) 09/24/2016  . Absolute anemia 09/06/2016  . Cataract 09/06/2016  . Abnormal chest x-ray 09/06/2016  . Drug-induced folate deficiency anemia 09/06/2016  . Esophagitis, reflux 09/06/2016  . Hypogammaglobulinemia ([place]) 09/06/2016  . Decreased leukocytes 09/06/2016  . Pneumococcal vaccination given 09/06/2016  . Iron deficiency anemia due to chronic blood loss 08/23/2016  . Pancytopenia ([place]) 08/22/2016  . History of stroke with residual effects 06/06/2016  . Productive cough 02/29/2016  . History of prostate cancer 09/07/2015  . Acute colitis 09/01/2015  . Bloody diarrhea 09/01/2015  . Decrease in appetite 09/01/2015  . Colitis 08/18/2015  . GERD (gastroesophageal reflux disease) 08/05/2015  . Adenopathy   . Candidate for statin therapy due to risk of future cardiovascular event 12/08/2014  . Elevated serum globulin level 12/08/2014  . Annual physical exam 11/21/2014  . COPD (chronic obstructive pulmonary disease) ([place]) 08/20/2014  . Dyslipidemia 08/20/2014  . Acid reflux 08/20/2014  . Cerebrovascular accident, old 08/20/2014  . Personal history of malignant neoplasm of prostate 08/20/2014  . Chronic insomnia 08/20/2014  . Lung mass 08/20/2014  . Arthritis or polyarthritis, rheumatoid ([place]) 08/20/2014  . Prostate cancer ([place]) 11/01/2013  . Flutter-fibrillation 03/18/2005    Past Surgical History:  Procedure Laterality Date  . APPENDECTOMY  1965  .  CATARACT EXTRACTION W/PHACO Right 01/05/2016   Procedure: CATARACT EXTRACTION PHACO AND INTRAOCULAR LENS PLACEMENT (IOC);  Surgeon: [name], MD;  Location: [facility] ORS;  Service: Ophthalmology;  Laterality: Right;  [place] 1.14 AP% 18.1 CDE 13.44 FLUID PACK LOT # [id number] H  . ENDOBRONCHIAL ULTRASOUND N/A 02/26/2015   Procedure: ENDOBRONCHIAL ULTRASOUND;  Surgeon: Flora Lipps, MD;  Location: [facility] ORS;  Service:  Cardiopulmonary;  Laterality: N/A;  . [place]    Prior to Admission medications   Medication Sig Start Date End Date Taking? Authorizing Provider  albuterol (PROAIR HFA) 108 (90 Base) MCG/ACT inhaler Inhale 2 puffs into the lungs every 6 (six) hours as needed for wheezing. 11/01/16   Roselee Nova, MD  aspirin EC 81 MG tablet Take 81 mg by mouth daily.    [provider]  atorvastatin (LIPITOR) 10 MG tablet TAKE 1 TABLET (10 MG TOTAL) BY MOUTH DAILY AT 6 PM. 12/26/16   Roselee Nova, MD  B Complex-C (B-COMPLEX WITH VITAMIN C) tablet Take 1 tablet by mouth daily.    [provider]  cephALEXin (KEFLEX) 500 MG capsule Take 1 capsule (500 mg total) by mouth 2 (two) times daily. 01/19/17   Hinda Kehr, MD  Fluticasone-Salmeterol (ADVAIR DISKUS) 250-50 MCG/DOSE AEPB Inhale 1 puff into the lungs every morning.  05/20/14   [provider]  folic acid (FOLVITE) 1 MG tablet Take 1 tablet (1 mg total) by mouth daily. 08/24/16   Lloyd Huger, MD  methotrexate 2.5 MG tablet Take 20 mg by mouth every Tuesday. With Supper    [provider]  mirtazapine (REMERON) 15 MG tablet Take 1 tablet (15 mg total) by mouth at bedtime. 12/02/15   Roselee Nova, MD  omeprazole (PRILOSEC) 40 MG capsule Take 1 capsule (40 mg total) by mouth daily. 08/05/15   Roselee Nova, MD  tiotropium (SPIRIVA HANDIHALER) 18 MCG inhalation capsule Place into inhaler and inhale 2 (two) times daily.  05/20/14   [provider]  traZODone (DESYREL) 100 MG tablet Take 1 tablet (100 mg total) by mouth at bedtime. 11/01/16   Roselee Nova, MD    Allergies Plaquenil [hydroxychloroquine] and Simvastatin  Family History  Problem Relation Age of Onset  . Cancer Mother   . Cancer Father        Lung Cancer  . Cancer Sister   . Cancer Brother     Social History Social History   Tobacco Use  . Smoking status: Former Smoker    Last attempt to quit: 02/21/2013     Years since quitting: 3.9  . Smokeless tobacco: Never Used  Substance Use Topics  . Alcohol use: No    Alcohol/week: 0.0 oz  . Drug use: No    Review of Systems Constitutional: No fever/chills Cardiovascular: Denies chest pain. Respiratory: Denies shortness of breath. Gastrointestinal: Gradually worsening suprapubic pain and distention in the setting of acute urinary retention.  No nausea, no vomiting.   Genitourinary: Negative for dysuria and hematuria, but unable to urinate Musculoskeletal: Negative for neck pain.  Negative for back pain. Integumentary: Negative for rash. Neurological: Negative for headaches, focal weakness or numbness.   ____________________________________________   PHYSICAL EXAM:  VITAL SIGNS: ED Triage Vitals  Enc Vitals Group     BP 01/18/17 2358 (!) 153/88     Pulse Rate 01/18/17 2358 71     Resp 01/18/17 2358 20     Temp 01/18/17 2358 98.1 F (  36.7 C)     Temp Source 01/18/17 2358 Oral     SpO2 01/18/17 2358 90 %     Weight 01/18/17 2359 55.3 kg (122 lb)     Height 01/18/17 2359 1.702 m (5\' 7" )     Head Circumference --      Peak Flow --      Pain Score 01/18/17 2358 9     Pain Loc --      Pain Edu? --      Excl. in [place]? --     Constitutional: Alert and oriented. Well appearing and in no acute distress. Eyes: Conjunctivae are normal.  Head: Atraumatic. Cardiovascular: Normal rate, regular rhythm. Good peripheral circulation.  Respiratory: Normal respiratory effort.  No retractions. Using his baseline nasal cannula oxygen. Gastrointestinal: Soft and nontender. No distention.  Genitourinary: Circumcised male, indwelling Foley catheter at this time draining clear yellow urine, no gross blood Musculoskeletal: No lower extremity tenderness nor edema. No gross deformities of extremities. Neurologic:  Normal speech and language. No gross focal neurologic deficits are appreciated.  Skin:  Skin is warm, dry and intact. No rash noted. Psychiatric:  Mood and affect are normal. Speech and behavior are normal.  ____________________________________________   LABS (all labs ordered are listed, but only abnormal results are displayed)  Labs Reviewed  URINALYSIS, ROUTINE W REFLEX MICROSCOPIC - Abnormal; Notable for the following components:      Result Value   Color, Urine YELLOW (*)    APPearance CLEAR (*)    Hgb urine dipstick SMALL (*)    Protein, ur 30 (*)    Leukocytes, UA SMALL (*)    All other components within normal limits  URINE CULTURE   ____________________________________________  EKG  None - EKG not ordered by ED physician ____________________________________________  RADIOLOGY   No results found.  ____________________________________________   PROCEDURES  Critical Care performed: No   Procedure(s) performed:   Procedures   ____________________________________________   INITIAL IMPRESSION / ASSESSMENT AND PLAN / ED COURSE  As part of my medical decision making, I reviewed the following data within the [place] notes reviewed and incorporated and Labs reviewed     Differential diagnosis includes, but is not limited to, acute urinary retention secondary to prostate issues, urinary tract infection, ureterolithiasis, kidney failure, etc.  However the patient has a very good story for straightforward and uncomplicated urinary retention.  I am awaiting the results of his urinalysis to see if he needs to start antibiotics but it is reassuring that he has an appointment with his new urologist later today.  I gave him my usual and customary outpatient Foley catheter talk, recommendations, and return precautions.  He understands and agrees with the plan.  Clinical Course as of Jan 20 135  Thu Jan 19, 2017  0131 Given that the patient has too numerous to count white blood cells in his urine I have sent a culture and I will start him on Keflex.  I will make a point in the discharge  instructions (which I told him to take to his urologist) to mention the results of the urinalysis.  [CF]    Clinical Course User Index [CF] Hinda Kehr, MD    ____________________________________________  FINAL CLINICAL IMPRESSION(S) / ED DIAGNOSES  Final diagnoses:  Urinary retention  Pyuria     MEDICATIONS GIVEN DURING THIS VISIT:  Medications  cephALEXin (KEFLEX) capsule 500 mg (not administered)  lidocaine (XYLOCAINE) 2 % jelly 1 application (1 application Urethral  Given 01/19/17 0042)     ED Discharge Orders        Ordered    cephALEXin (KEFLEX) 500 MG capsule  2 times daily     01/19/17 0133       Note:  This document was prepared using Dragon voice recognition software and may include unintentional dictation errors.    Hinda Kehr, MD 01/19/17 [phone]

## 2017-01-21 LAB — URINE CULTURE
Culture: 80000 — AB
SPECIAL REQUESTS: NORMAL

## 2017-01-22 NOTE — Progress Notes (Signed)
ED Culture Report Follow up  UCx from 11/29 growing 80k coag neg staph. Pt was discharged on cephalexin. Pt with hx of prostate cancer and previous UTIs. Discussed with EDP Dr Burlene Arnt who would like to change antibiotic to nitrofurantoin 100 mg PO BID x7 days. MD also asked to query pt for side effects.   Called pt and spoke with pt. Pt says he is doing better since the ED visit as they put in a catheter in the ED. No further sxs; pt denies fever and chills when asked. Informed pt opf need to switch antibiotics. Pt states he uses [facility] in [place] on [address]. Rx nitrofurantoin 100 mg PO BID x7 [facility] Alyssa. She stated they will call pt for pick up when ready.

## 2017-01-27 NOTE — Progress Notes (Deleted)
01/30/2017 8:03 AM   Collin Gonzales 1935/07/19 [id number]  Referring provider: Roselee Nova, MD [address] [place], [place] [address]  No chief complaint on file.   HPI: Patient is a 81 year old African American male with prostate cancer who was seen in [facility] ED for urinary retention on 01/19/2017.  Location Severity Quality Context Timing Duration Modifying Factors Associated signs/symptoms            OR Status of 3 chronic or inactive problems  Contrast CT in 07/2015 no adrenal abnormality. No focal renal abnormality. No stones or hydronephrosis. Urinary bladder is unremarkable.  Radiation seeds in the prostate.   Reviewed ED notes.                   - Collin Gonzales is a 81 y.o. male with a complicated medical history that includes prostate cancer status post "seed" treatment.  He has had issues with urinary retention but never required a Foley catheter.  He states that his symptoms started yesterday and gradually getting worse.  He states that he has been going to the bathroom frequently but is only been able to go "a few teaspoons at a time".  By tonight he was completely unable to urinate and he was developing severe pain and the sensation of distention in his lower abdomen.  Nothing made the symptoms better and they were gradually getting worse over time.  He denies any recent fever/chills, nausea, vomiting, flank pain, or dysuria when he is able to go.  He has not seen any gross hematuria.  He reports that in the past he was treated by Dr. Yves Dill with urology but he recently changed and he actually has an appointment later today with a new doctor at [facility] (he cannot remember specifically the name of the provider).  He reports that he was treated a few months ago for a urinary tract infection and he was on several different antibiotics but he does not feel that anyone of them in particular made a difference or made him feel any  better or worse.  Bladder scan noted > 999 cc of urine.  Foley placed. Started on Keflex, switched to Macrobid once culture was available.  Staphylococcus + urine culture.     PMH: Past Medical History:  Diagnosis Date  . Arthritis    rheumatoid arthritis  . Cancer ([place])   . Collagen vascular disease ([place])   . COPD (chronic obstructive pulmonary disease) ([place])   . GERD (gastroesophageal reflux disease)   . History of prostate cancer   . HOH (hard of hearing)   . Insomnia   . Lung cancer ([place])   . Oxygen deficit    2L HS AND PRN  . Stroke ([place])    2000  . Wheezing     Surgical History: Past Surgical History:  Procedure Laterality Date  . APPENDECTOMY  1965  . CATARACT EXTRACTION W/PHACO Right 01/05/2016   Procedure: CATARACT EXTRACTION PHACO AND INTRAOCULAR LENS PLACEMENT (IOC);  Surgeon: [name], MD;  Location: [facility] ORS;  Service: Ophthalmology;  Laterality: Right;  [place] 1.14 AP% 18.1 CDE 13.44 FLUID PACK LOT # [id number] H  . ENDOBRONCHIAL ULTRASOUND N/A 02/26/2015   Procedure: ENDOBRONCHIAL ULTRASOUND;  Surgeon: Flora Lipps, MD;  Location: [facility] ORS;  Service: Cardiopulmonary;  Laterality: N/A;  . HERNIA REPAIR  1965    Home Medications:  Allergies as of 01/30/2017      Reactions   Plaquenil [hydroxychloroquine]  Simvastatin Other (See Comments)   Bad dreams      Medication List        Accurate as of 01/27/17  8:03 AM. Always use your most recent med list.          ADVAIR DISKUS 250-50 MCG/DOSE Aepb Generic drug:  Fluticasone-Salmeterol Inhale 1 puff into the lungs every morning.   albuterol 108 (90 Base) MCG/ACT inhaler Commonly known as:  PROAIR HFA Inhale 2 puffs into the lungs every 6 (six) hours as needed for wheezing.   aspirin EC 81 MG tablet Take 81 mg by mouth daily.   atorvastatin 10 MG tablet Commonly known as:  LIPITOR TAKE 1 TABLET (10 MG TOTAL) BY MOUTH DAILY AT 6 PM.   B-complex with vitamin C tablet Take 1 tablet by mouth  daily.   cephALEXin 500 MG capsule Commonly known as:  KEFLEX Take 1 capsule (500 mg total) by mouth 2 (two) times daily.   folic acid 1 MG tablet Commonly known as:  FOLVITE Take 1 tablet (1 mg total) by mouth daily.   methotrexate 2.5 MG tablet Take 20 mg by mouth every Tuesday. With Supper   mirtazapine 15 MG tablet Commonly known as:  REMERON Take 1 tablet (15 mg total) by mouth at bedtime.   omeprazole 40 MG capsule Commonly known as:  PRILOSEC Take 1 capsule (40 mg total) by mouth daily.   SPIRIVA HANDIHALER 18 MCG inhalation capsule Generic drug:  tiotropium Place into inhaler and inhale 2 (two) times daily.   traZODone 100 MG tablet Commonly known as:  DESYREL Take 1 tablet (100 mg total) by mouth at bedtime.       Allergies:  Allergies  Allergen Reactions  . Plaquenil [Hydroxychloroquine]   . Simvastatin Other (See Comments)    Bad dreams    Family History: Family History  Problem Relation Age of Onset  . Cancer Mother   . Cancer Father        Lung Cancer  . Cancer Sister   . Cancer Brother     Social History:  reports that he quit smoking about 3 years ago. he has never used smokeless tobacco. He reports that he does not drink alcohol or use drugs.  ROS:                                        Physical Exam: There were no vitals taken for this visit.  Constitutional: Well nourished. Alert and oriented, No acute distress. HEENT: [place] AT, moist mucus membranes. Trachea midline, no masses. Cardiovascular: No clubbing, cyanosis, or edema. Respiratory: Normal respiratory effort, no increased work of breathing. GI: Abdomen is soft, non tender, non distended, no abdominal masses. Liver and spleen not palpable.  No hernias appreciated.  Stool sample for occult testing is not indicated.   GU: No CVA tenderness.  No bladder fullness or masses.  Patient with circumcised/uncircumcised phallus. ***Foreskin easily retracted***  Urethral  meatus is patent.  No penile discharge. No penile lesions or rashes. Scrotum without lesions, cysts, rashes and/or edema.  Testicles are located scrotally bilaterally. No masses are appreciated in the testicles. Left and right epididymis are normal. Rectal: Patient with  normal sphincter tone. Anus and perineum without scarring or rashes. No rectal masses are appreciated. Prostate is approximately *** grams, *** nodules are appreciated. Seminal vesicles are normal. Skin: No rashes, bruises or suspicious lesions. Lymph: No  cervical or inguinal adenopathy. Neurologic: Grossly intact, no focal deficits, moving all 4 extremities. Psychiatric: Normal mood and affect.  Laboratory Data: Lab Results  Component Value Date   WBC 5.7 11/22/2016   HGB 11.3 (L) 11/22/2016   HCT 33.8 (L) 11/22/2016   MCV 95.4 11/22/2016   PLT 240 11/22/2016    Lab Results  Component Value Date   CREATININE 1.06 07/29/2016       Component Value Date/Time   CHOL 167 05/30/2016 1128   CHOL 152 07/08/2015 0846   HDL 55 05/30/2016 1128   HDL 43 07/08/2015 0846   CHOLHDL 3.0 05/30/2016 1128   VLDL 13 05/30/2016 1128   LDLCALC 99 05/30/2016 1128   [place] 95 07/08/2015 0846    Lab Results  Component Value Date   AST 22 07/29/2016   Lab Results  Component Value Date   ALT 13 (L) 07/29/2016   Urinalysis ***  I have reviewed the labs.   Pertinent Imaging: CLINICAL DATA:  Lower abdominal pain since yesterday. Left lower quadrant pain.  EXAM: CT ABDOMEN AND PELVIS WITH CONTRAST  TECHNIQUE: Multidetector CT imaging of the abdomen and pelvis was performed using the standard protocol following bolus administration of intravenous contrast.  CONTRAST:  128mL ISOVUE-300 IOPAMIDOL (ISOVUE-300) INJECTION 61%  COMPARISON:  The PET CT 06/23/2015  FINDINGS: Lower chest: Lung bases are clear. No effusions. Heart is normal size.  Hepatobiliary: No focal hepatic abnormality.  Gallbladder unremarkable.  Pancreas: Mild prominence of the pancreatic duct. No focal pancreatic abnormality.  Spleen: No focal abnormality.  Normal size.  Adrenals/Urinary Tract: No adrenal abnormality. No focal renal abnormality. No stones or hydronephrosis. Urinary bladder is unremarkable.  Stomach/Bowel: There is colonic wall thickening involving the distal transverse colon, splenic flexure, descending colon and sigmoid colon compatible with colitis. No evidence of bowel obstruction. Stomach and small bowel grossly unremarkable.  Vascular/Lymphatic: Scattered areas of atherosclerotic calcifications in the aorta and iliac vessels. No aneurysm. No adenopathy.  Reproductive: Radiation seeds noted in the region of the prostate.  Other: Small amount of free fluid in the pelvis and adjacent to the liver. No free air.  Musculoskeletal: Degenerative changes in the lower lumbar spine. No acute bony abnormality.  IMPRESSION: Colonic wall thickening from the distal transverse colon into the sigmoid colon compatible with colitis. Associated small amount of free fluid in the pelvis and adjacent to the liver.  Aortic atherosclerosis.  Radiation seeds in the prostate.   Electronically Signed   By: Rolm Baptise M.D.   On: 08/18/2015 11:08 I have independently reviewed the films.    Assessment & Plan:  ***  1. Acute urinary retention:     -foley catheter removed  -voiding trial today    - start tamsulosin 0.4 mg - script sent to pharmacy  -return if unable to urinate or experiencing suprapubic discomfort  -follow-up in one month for I PSS score, PVR, PSA and exam.  2. Prostate cancer  - pathology unknown at this time  - treated with "seeds"  - will request records from Dr. Letta Kocher office     No Follow-up on file.  These notes generated with voice recognition software. I apologize for typographical errors.  [name], [facility] [address], [place], [place] [address] [phone]

## 2017-01-30 ENCOUNTER — Ambulatory Visit: Payer: Medicare HMO | Admitting: Urology

## 2017-01-31 ENCOUNTER — Other Ambulatory Visit: Payer: Self-pay | Admitting: Family Medicine

## 2017-01-31 DIAGNOSIS — F5104 Psychophysiologic insomnia: Secondary | ICD-10-CM

## 2017-01-31 MED ORDER — TRAZODONE HCL 100 MG PO TABS: 100.0000 mg | ORAL_TABLET | Freq: Every day | ORAL | 0 refills | Status: DC

## 2017-01-31 NOTE — Telephone Encounter (Signed)
Copied from [place] [phone]. Topic: General - Other >> Jan 31, 2017  1:52 PM Darl Householder, RMA wrote: Reason for CRM: Medication refill request for Trazodone 100 mg [facility] to [facility]

## 2017-02-02 ENCOUNTER — Encounter: Payer: Self-pay | Admitting: Urology

## 2017-02-02 ENCOUNTER — Ambulatory Visit: Payer: Medicare HMO | Admitting: Urology

## 2017-02-02 VITALS — BP 129/71 | HR 75 | Ht 67.0 in | Wt 120.1 lb

## 2017-02-02 DIAGNOSIS — R339 Retention of urine, unspecified: Secondary | ICD-10-CM | POA: Diagnosis not present

## 2017-02-02 DIAGNOSIS — C61 Malignant neoplasm of prostate: Secondary | ICD-10-CM

## 2017-02-02 LAB — BLADDER SCAN AMB NON-IMAGING: SCAN RESULT: 18

## 2017-02-02 MED ORDER — TAMSULOSIN HCL 0.4 MG PO CAPS: 0.4000 mg | ORAL_CAPSULE | Freq: Every day | ORAL | 11 refills | Status: DC

## 2017-02-02 NOTE — Progress Notes (Signed)
02/02/2017 9:04 AM   Collin Gonzales 03/22/1935 [id number]  Referring provider: Roselee Nova, MD [address] [place], [place] [address]  Chief Complaint  Patient presents with  . Urinary Retention    HPI: The patient is a 81 year old gentleman with a past medical history of prostate cancer status post brachytherapy who presents today after being diagnosed with urinary retention in the emergency department.  A Foley catheter was placed at that time for an unknown volume.  The patient states last 3-4 months he has had difficulty voiding.  He has nocturia x3-4.  He has a weak intermittent stream.  He has to strain to urinate.  He did not feel like he was emptying his bladder.  During this time, the patient has been treated for multiple urinary tract infections over the last few months.  In review of his cultures, all of them are not true positive cultures are contaminated with either coagulase-negative staph or strep viridans.  He then went to the ER a Foley catheter was placed as discussed above.  He has not been started on any new medications.  He does not take any medications for BPH.  In regards to his prostate cancer, he was treated with brachytherapy in approximately 2010.  He reports that earlier this year his PSA was less than 1.   PMH: Past Medical History:  Diagnosis Date  . Arthritis    rheumatoid arthritis  . Cancer ([place])   . Collagen vascular disease ([place])   . COPD (chronic obstructive pulmonary disease) ([place])   . GERD (gastroesophageal reflux disease)   . History of prostate cancer   . HOH (hard of hearing)   . Insomnia   . Lung cancer ([place])   . Oxygen deficit    2L HS AND PRN  . Stroke ([place])    2000  . Wheezing     Surgical History: Past Surgical History:  Procedure Laterality Date  . APPENDECTOMY  1965  . CATARACT EXTRACTION W/PHACO Right 01/05/2016   Procedure: CATARACT EXTRACTION PHACO AND INTRAOCULAR LENS PLACEMENT (IOC);  Surgeon:  [name], MD;  Location: [facility] ORS;  Service: Ophthalmology;  Laterality: Right;  [place] 1.14 AP% 18.1 CDE 13.44 FLUID PACK LOT # [id number] H  . ENDOBRONCHIAL ULTRASOUND N/A 02/26/2015   Procedure: ENDOBRONCHIAL ULTRASOUND;  Surgeon: Flora Lipps, MD;  Location: [facility] ORS;  Service: Cardiopulmonary;  Laterality: N/A;  . HERNIA REPAIR  1965    Home Medications:  Allergies as of 02/02/2017      Reactions   Plaquenil [hydroxychloroquine]    Simvastatin Other (See Comments)   Bad dreams      Medication List        Accurate as of 02/02/17  9:04 AM. Always use your most recent med list.          ADVAIR DISKUS 250-50 MCG/DOSE Aepb Generic drug:  Fluticasone-Salmeterol Inhale 1 puff into the lungs every morning.   albuterol 108 (90 Base) MCG/ACT inhaler Commonly known as:  PROAIR HFA Inhale 2 puffs into the lungs every 6 (six) hours as needed for wheezing.   aspirin EC 81 MG tablet Take 81 mg by mouth daily.   atorvastatin 10 MG tablet Commonly known as:  LIPITOR TAKE 1 TABLET (10 MG TOTAL) BY MOUTH DAILY AT 6 PM.   B-complex with vitamin C tablet Take 1 tablet by mouth daily.   folic acid 1 MG tablet Commonly known as:  FOLVITE Take 1 tablet (1 mg total) by mouth  daily.   methotrexate 2.5 MG tablet Take 20 mg by mouth every Tuesday. With Supper   mirtazapine 15 MG tablet Commonly known as:  REMERON Take 1 tablet (15 mg total) by mouth at bedtime.   omeprazole 40 MG capsule Commonly known as:  PRILOSEC Take 1 capsule (40 mg total) by mouth daily.   SPIRIVA HANDIHALER 18 MCG inhalation capsule Generic drug:  tiotropium Place into inhaler and inhale 2 (two) times daily.   tamsulosin 0.4 MG Caps capsule Commonly known as:  FLOMAX Take 1 capsule (0.4 mg total) by mouth daily.   traZODone 100 MG tablet Commonly known as:  DESYREL Take 1 tablet (100 mg total) by mouth at bedtime.       Allergies:  Allergies  Allergen Reactions  . Plaquenil  [Hydroxychloroquine]   . Simvastatin Other (See Comments)    Bad dreams    Family History: Family History  Problem Relation Age of Onset  . Cancer Mother   . Cancer Father        Lung Cancer  . Cancer Sister   . Cancer Brother     Social History:  reports that he quit smoking about 3 years ago. he has never used smokeless tobacco. He reports that he does not drink alcohol or use drugs.  ROS: UROLOGY Frequent Urination?: Yes Hard to postpone urination?: Yes Burning/pain with urination?: Yes Get up at night to urinate?: Yes Leakage of urine?: Yes Urine stream starts and stops?: Yes Trouble starting stream?: Yes Do you have to strain to urinate?: Yes Blood in urine?: No Urinary tract infection?: No Sexually transmitted disease?: No Injury to kidneys or bladder?: No Painful intercourse?: No Weak stream?: Yes Erection problems?: Yes Penile pain?: Yes  Gastrointestinal Nausea?: No Vomiting?: No Indigestion/heartburn?: No Diarrhea?: No Constipation?: Yes  Constitutional Fever: No Night sweats?: No Weight loss?: Yes Fatigue?: Yes  Skin Skin rash/lesions?: No Itching?: No  Eyes Blurred vision?: No Double vision?: No  Ears/Nose/Throat Sore throat?: No Sinus problems?: No  Hematologic/Lymphatic Swollen glands?: No Easy bruising?: No  Cardiovascular Leg swelling?: No Chest pain?: No  Respiratory Cough?: Yes Shortness of breath?: Yes  Endocrine Excessive thirst?: No  Musculoskeletal Back pain?: No Joint pain?: No  Neurological Headaches?: No Dizziness?: No  Psychologic Depression?: No Anxiety?: No  Physical Exam: BP 129/71 (BP Location: Right Arm, Patient Position: Sitting, Cuff Size: Normal)   Pulse 75   Ht 5\' 7"  (1.702 m)   Wt 120 lb 1.6 oz (54.5 kg)   BMI 18.81 kg/m   Constitutional:  Alert and oriented, No acute distress. HEENT: [place] AT, moist mucus membranes.  Trachea midline, no masses. Cardiovascular: No clubbing, cyanosis, or  edema. Respiratory: Normal respiratory effort, no increased work of breathing. GI: Abdomen is soft, nontender, nondistended, no abdominal masses GU: No CVA tenderness.  Skin: No rashes, bruises or suspicious lesions. Lymph: No cervical or inguinal adenopathy. Neurologic: Grossly intact, no focal deficits, moving all 4 extremities. Psychiatric: Normal mood and affect.  Laboratory Data: Lab Results  Component Value Date   WBC 5.7 11/22/2016   HGB 11.3 (L) 11/22/2016   HCT 33.8 (L) 11/22/2016   MCV 95.4 11/22/2016   PLT 240 11/22/2016    Lab Results  Component Value Date   CREATININE 1.06 07/29/2016    No results found for: PSA  No results found for: TESTOSTERONE  No results found for: HGBA1C  Urinalysis    Component Value Date/Time   COLORURINE YELLOW (A) 01/19/2017 0050   APPEARANCEUR CLEAR (  A) 01/19/2017 0050   LABSPEC 1.017 01/19/2017 0050   PHURINE 6.0 01/19/2017 0050   GLUCOSEU NEGATIVE 01/19/2017 0050   HGBUR SMALL (A) 01/19/2017 0050   BILIRUBINUR NEGATIVE 01/19/2017 0050   KETONESUR NEGATIVE 01/19/2017 0050   PROTEINUR 30 (A) 01/19/2017 0050   NITRITE NEGATIVE 01/19/2017 0050   LEUKOCYTESUR SMALL (A) 01/19/2017 0050    Assessment & Plan:    1.  Urinary retention We will start the patient on Flomax at this time.  He will undergo a trial of void today.  If he passes, he will follow-up in 1 month for a PVR.  If he fails he will follow-up in 1-2 weeks for nurse visit for a trial of void after starting Flomax.  2.  History of prostate cancer We will need to check a PSA once the patient's catheter has been successfully removed per  Return in about 4 weeks (around 03/02/2017) for PVR.  [name], MD  [facility] [address], [place], [place] [address] [phone]

## 2017-02-02 NOTE — Progress Notes (Signed)
Catheter Removal  Patient is present today for a catheter removal.  38ml of water was drained from the balloon. A 16FR foley cath was removed from the bladder no complications were noted . Patient tolerated well.  Preformed by: Elberta Leatherwood, CMA  Follow up/ Additional notes: Return today at 3 pm  Patient returned this afternoon for a PVR. He had 18 ml in the bladder. He is following up in 4 weeks.

## 2017-02-05 DIAGNOSIS — J449 Chronic obstructive pulmonary disease, unspecified: Secondary | ICD-10-CM | POA: Diagnosis not present

## 2017-03-01 ENCOUNTER — Emergency Department: Payer: Medicare HMO

## 2017-03-01 ENCOUNTER — Other Ambulatory Visit: Payer: Self-pay

## 2017-03-01 ENCOUNTER — Inpatient Hospital Stay
Admission: EM | Admit: 2017-03-01 | Discharge: 2017-03-04 | DRG: 871 | Disposition: A | Discharge status: Skilled Nursing Facility | Payer: Medicare HMO | Attending: Specialist | Admitting: Specialist

## 2017-03-01 DIAGNOSIS — G4700 Insomnia, unspecified: Secondary | ICD-10-CM | POA: Diagnosis present

## 2017-03-01 DIAGNOSIS — Z8546 Personal history of malignant neoplasm of prostate: Secondary | ICD-10-CM | POA: Diagnosis not present

## 2017-03-01 DIAGNOSIS — Z9841 Cataract extraction status, right eye: Secondary | ICD-10-CM

## 2017-03-01 DIAGNOSIS — J440 Chronic obstructive pulmonary disease with acute lower respiratory infection: Secondary | ICD-10-CM | POA: Diagnosis not present

## 2017-03-01 DIAGNOSIS — H9190 Unspecified hearing loss, unspecified ear: Secondary | ICD-10-CM | POA: Diagnosis present

## 2017-03-01 DIAGNOSIS — J9621 Acute and chronic respiratory failure with hypoxia: Secondary | ICD-10-CM | POA: Diagnosis not present

## 2017-03-01 DIAGNOSIS — Z5189 Encounter for other specified aftercare: Secondary | ICD-10-CM | POA: Diagnosis not present

## 2017-03-01 DIAGNOSIS — Z87891 Personal history of nicotine dependence: Secondary | ICD-10-CM | POA: Diagnosis not present

## 2017-03-01 DIAGNOSIS — Z85118 Personal history of other malignant neoplasm of bronchus and lung: Secondary | ICD-10-CM

## 2017-03-01 DIAGNOSIS — Y95 Nosocomial condition: Secondary | ICD-10-CM | POA: Diagnosis present

## 2017-03-01 DIAGNOSIS — Z9981 Dependence on supplemental oxygen: Secondary | ICD-10-CM

## 2017-03-01 DIAGNOSIS — Z7401 Bed confinement status: Secondary | ICD-10-CM | POA: Diagnosis not present

## 2017-03-01 DIAGNOSIS — J449 Chronic obstructive pulmonary disease, unspecified: Secondary | ICD-10-CM | POA: Diagnosis not present

## 2017-03-01 DIAGNOSIS — J189 Pneumonia, unspecified organism: Secondary | ICD-10-CM

## 2017-03-01 DIAGNOSIS — R05 Cough: Secondary | ICD-10-CM | POA: Diagnosis not present

## 2017-03-01 DIAGNOSIS — R1312 Dysphagia, oropharyngeal phase: Secondary | ICD-10-CM | POA: Diagnosis not present

## 2017-03-01 DIAGNOSIS — R531 Weakness: Secondary | ICD-10-CM | POA: Diagnosis not present

## 2017-03-01 DIAGNOSIS — Z79899 Other long term (current) drug therapy: Secondary | ICD-10-CM

## 2017-03-01 DIAGNOSIS — Z7952 Long term (current) use of systemic steroids: Secondary | ICD-10-CM | POA: Diagnosis not present

## 2017-03-01 DIAGNOSIS — Z7982 Long term (current) use of aspirin: Secondary | ICD-10-CM | POA: Diagnosis not present

## 2017-03-01 DIAGNOSIS — Z961 Presence of intraocular lens: Secondary | ICD-10-CM | POA: Diagnosis present

## 2017-03-01 DIAGNOSIS — N390 Urinary tract infection, site not specified: Secondary | ICD-10-CM | POA: Diagnosis not present

## 2017-03-01 DIAGNOSIS — R0902 Hypoxemia: Secondary | ICD-10-CM

## 2017-03-01 DIAGNOSIS — A419 Sepsis, unspecified organism: Principal | ICD-10-CM

## 2017-03-01 DIAGNOSIS — K219 Gastro-esophageal reflux disease without esophagitis: Secondary | ICD-10-CM | POA: Diagnosis present

## 2017-03-01 DIAGNOSIS — Z8673 Personal history of transient ischemic attack (TIA), and cerebral infarction without residual deficits: Secondary | ICD-10-CM

## 2017-03-01 DIAGNOSIS — N179 Acute kidney failure, unspecified: Secondary | ICD-10-CM | POA: Diagnosis not present

## 2017-03-01 DIAGNOSIS — M069 Rheumatoid arthritis, unspecified: Secondary | ICD-10-CM | POA: Diagnosis present

## 2017-03-01 DIAGNOSIS — J159 Unspecified bacterial pneumonia: Secondary | ICD-10-CM | POA: Diagnosis present

## 2017-03-01 DIAGNOSIS — Z23 Encounter for immunization: Secondary | ICD-10-CM | POA: Diagnosis not present

## 2017-03-01 DIAGNOSIS — R0600 Dyspnea, unspecified: Secondary | ICD-10-CM | POA: Diagnosis not present

## 2017-03-01 DIAGNOSIS — Z923 Personal history of irradiation: Secondary | ICD-10-CM | POA: Diagnosis not present

## 2017-03-01 DIAGNOSIS — M6281 Muscle weakness (generalized): Secondary | ICD-10-CM | POA: Diagnosis not present

## 2017-03-01 DIAGNOSIS — R0602 Shortness of breath: Secondary | ICD-10-CM

## 2017-03-01 LAB — CBC WITH DIFFERENTIAL/PLATELET
BASOS ABS: 0 10*3/uL (ref 0–0.1)
BASOS PCT: 0 %
Eosinophils Absolute: 0 10*3/uL (ref 0–0.7)
Eosinophils Relative: 0 %
HEMATOCRIT: 34.8 % — AB (ref 40.0–52.0)
Hemoglobin: 11.4 g/dL — ABNORMAL LOW (ref 13.0–18.0)
Lymphocytes Relative: 12 %
Lymphs Abs: 0.4 10*3/uL — ABNORMAL LOW (ref 1.0–3.6)
MCH: 30.5 pg (ref 26.0–34.0)
MCHC: 32.6 g/dL (ref 32.0–36.0)
MCV: 93.4 fL (ref 80.0–100.0)
MONO ABS: 0.1 10*3/uL — AB (ref 0.2–1.0)
Monocytes Relative: 3 %
NEUTROS ABS: 3 10*3/uL (ref 1.4–6.5)
NEUTROS PCT: 85 %
Platelets: 86 10*3/uL — ABNORMAL LOW (ref 150–440)
RBC: 3.73 MIL/uL — ABNORMAL LOW (ref 4.40–5.90)
RDW: 16.2 % — AB (ref 11.5–14.5)
WBC: 3.5 10*3/uL — AB (ref 3.8–10.6)

## 2017-03-01 LAB — COMPREHENSIVE METABOLIC PANEL
ALK PHOS: 83 U/L (ref 38–126)
ALT: 11 U/L — ABNORMAL LOW (ref 17–63)
ANION GAP: 9 (ref 5–15)
AST: 29 U/L (ref 15–41)
Albumin: 2.9 g/dL — ABNORMAL LOW (ref 3.5–5.0)
BILIRUBIN TOTAL: 0.6 mg/dL (ref 0.3–1.2)
BUN: 22 mg/dL — ABNORMAL HIGH (ref 6–20)
CALCIUM: 8.5 mg/dL — AB (ref 8.9–10.3)
CO2: 27 mmol/L (ref 22–32)
Chloride: 100 mmol/L — ABNORMAL LOW (ref 101–111)
Creatinine, Ser: 1.25 mg/dL — ABNORMAL HIGH (ref 0.61–1.24)
GFR, EST NON AFRICAN AMERICAN: 53 mL/min — AB (ref >60)
GLUCOSE: 108 mg/dL — AB (ref 65–99)
Potassium: 3.6 mmol/L (ref 3.5–5.1)
Sodium: 136 mmol/L (ref 135–145)
TOTAL PROTEIN: 8.9 g/dL — AB (ref 6.5–8.1)

## 2017-03-01 LAB — LACTIC ACID, PLASMA
Lactic Acid, Venous: 2 mmol/L (ref 0.5–1.9)
Lactic Acid, Venous: 1.3 mmol/L (ref 0.5–1.9)

## 2017-03-01 LAB — URINALYSIS, ROUTINE W REFLEX MICROSCOPIC
BACTERIA UA: NONE SEEN
BILIRUBIN URINE: NEGATIVE
Glucose, UA: NEGATIVE mg/dL
Ketones, ur: 5 mg/dL — AB
NITRITE: NEGATIVE
PH: 5 (ref 5.0–8.0)
Protein, ur: 30 mg/dL — AB
SPECIFIC GRAVITY, URINE: 1.019 (ref 1.005–1.030)

## 2017-03-01 LAB — PROCALCITONIN: PROCALCITONIN: 0.11 ng/mL

## 2017-03-01 LAB — INFLUENZA PANEL BY PCR (TYPE A & B)
INFLBPCR: NEGATIVE
Influenza A By PCR: NEGATIVE

## 2017-03-01 LAB — TROPONIN I: Troponin I: <0.03 ng/mL (ref <0.03)

## 2017-03-01 MED ORDER — DEXTROSE 5 % IV SOLN
250.0000 mg | INTRAVENOUS | Status: DC
  Filled 2017-03-01: qty 250

## 2017-03-01 MED ORDER — ATORVASTATIN CALCIUM 20 MG PO TABS
10.0000 mg | ORAL_TABLET | Freq: Every day | ORAL | Status: DC
  Administered 2017-03-02 – 2017-03-03 (×2): 10 mg via ORAL
  Filled 2017-03-01 (×2): qty 1

## 2017-03-01 MED ORDER — VANCOMYCIN HCL IN DEXTROSE 1-5 GM/200ML-% IV SOLN
1000.0000 mg | Freq: Once | INTRAVENOUS | Status: AC
  Administered 2017-03-01: 1000 mg via INTRAVENOUS
  Filled 2017-03-01: qty 200

## 2017-03-01 MED ORDER — TAMSULOSIN HCL 0.4 MG PO CAPS
0.4000 mg | ORAL_CAPSULE | Freq: Every day | ORAL | Status: DC
  Administered 2017-03-02 – 2017-03-04 (×3): 0.4 mg via ORAL
  Filled 2017-03-01 (×3): qty 1

## 2017-03-01 MED ORDER — DOCUSATE SODIUM 100 MG PO CAPS: 100.0000 mg | ORAL_CAPSULE | Freq: Two times a day (BID) | ORAL | Status: DC | PRN

## 2017-03-01 MED ORDER — PNEUMOCOCCAL VAC POLYVALENT 25 MCG/0.5ML IJ INJ
0.5000 mL | INJECTION | INTRAMUSCULAR | Status: AC
  Administered 2017-03-03: 17:00:00 0.5 mL via INTRAMUSCULAR
  Filled 2017-03-01: qty 0.5

## 2017-03-01 MED ORDER — MOMETASONE FURO-FORMOTEROL FUM 200-5 MCG/ACT IN AERO
2.0000 | INHALATION_SPRAY | Freq: Two times a day (BID) | RESPIRATORY_TRACT | Status: DC
  Administered 2017-03-02 – 2017-03-04 (×6): 2 via RESPIRATORY_TRACT
  Filled 2017-03-01: qty 8.8

## 2017-03-01 MED ORDER — DEXTROSE 5 % IV SOLN
1.0000 g | INTRAVENOUS | Status: DC
  Administered 2017-03-02: 14:00:00 1 g via INTRAVENOUS
  Filled 2017-03-01 (×2): qty 10

## 2017-03-01 MED ORDER — B COMPLEX-C PO TABS
1.0000 | ORAL_TABLET | Freq: Every day | ORAL | Status: DC
  Administered 2017-03-02 – 2017-03-04 (×3): 1 via ORAL
  Filled 2017-03-01 (×3): qty 1

## 2017-03-01 MED ORDER — PANTOPRAZOLE SODIUM 40 MG PO TBEC
40.0000 mg | DELAYED_RELEASE_TABLET | Freq: Every day | ORAL | Status: DC
  Administered 2017-03-02 – 2017-03-04 (×3): 40 mg via ORAL
  Filled 2017-03-01 (×3): qty 1

## 2017-03-01 MED ORDER — SODIUM CHLORIDE 0.9 % IV SOLN
INTRAVENOUS | Status: DC
  Administered 2017-03-01 – 2017-03-02 (×2): via INTRAVENOUS

## 2017-03-01 MED ORDER — VANCOMYCIN HCL IN DEXTROSE 750-5 MG/150ML-% IV SOLN: 750.0000 mg | INTRAVENOUS | Status: DC

## 2017-03-01 MED ORDER — DEXTROSE 5 % IV SOLN: 2.0000 g | INTRAVENOUS | Status: DC

## 2017-03-01 MED ORDER — MIRTAZAPINE 15 MG PO TABS
15.0000 mg | ORAL_TABLET | Freq: Every day | ORAL | Status: DC
  Administered 2017-03-02 – 2017-03-03 (×3): 15 mg via ORAL
  Filled 2017-03-01 (×3): qty 1

## 2017-03-01 MED ORDER — METHOTREXATE 2.5 MG PO TABS: 20.0000 mg | ORAL_TABLET | ORAL | Status: DC

## 2017-03-01 MED ORDER — DEXTROSE 5 % IV SOLN
2.0000 g | Freq: Once | INTRAVENOUS | Status: AC
  Administered 2017-03-01: 2 g via INTRAVENOUS
  Filled 2017-03-01: qty 2

## 2017-03-01 MED ORDER — SODIUM CHLORIDE 0.9 % IV BOLUS (SEPSIS)
1000.0000 mL | Freq: Once | INTRAVENOUS | Status: AC
  Administered 2017-03-01: 1000 mL via INTRAVENOUS

## 2017-03-01 MED ORDER — HEPARIN SODIUM (PORCINE) 5000 UNIT/ML IJ SOLN
5000.0000 [IU] | Freq: Three times a day (TID) | INTRAMUSCULAR | Status: DC
  Administered 2017-03-02 – 2017-03-03 (×4): 5000 [IU] via SUBCUTANEOUS
  Filled 2017-03-01 (×5): qty 1

## 2017-03-01 MED ORDER — ALBUTEROL SULFATE (2.5 MG/3ML) 0.083% IN NEBU: 3.0000 mL | INHALATION_SOLUTION | Freq: Four times a day (QID) | RESPIRATORY_TRACT | Status: DC | PRN

## 2017-03-01 MED ORDER — TIOTROPIUM BROMIDE MONOHYDRATE 18 MCG IN CAPS
18.0000 ug | ORAL_CAPSULE | Freq: Every day | RESPIRATORY_TRACT | Status: DC
  Administered 2017-03-02 – 2017-03-04 (×3): 18 ug via RESPIRATORY_TRACT
  Filled 2017-03-01: qty 5

## 2017-03-01 MED ORDER — ASPIRIN EC 81 MG PO TBEC
81.0000 mg | DELAYED_RELEASE_TABLET | Freq: Every day | ORAL | Status: DC
  Administered 2017-03-02 – 2017-03-04 (×3): 81 mg via ORAL
  Filled 2017-03-01 (×3): qty 1

## 2017-03-01 MED ORDER — FOLIC ACID 1 MG PO TABS
1.0000 mg | ORAL_TABLET | Freq: Every day | ORAL | Status: DC
  Administered 2017-03-02 – 2017-03-04 (×3): 1 mg via ORAL
  Filled 2017-03-01 (×3): qty 1

## 2017-03-01 MED ORDER — IPRATROPIUM-ALBUTEROL 0.5-2.5 (3) MG/3ML IN SOLN
3.0000 mL | RESPIRATORY_TRACT | Status: DC
  Administered 2017-03-01 – 2017-03-03 (×8): 3 mL via RESPIRATORY_TRACT
  Filled 2017-03-01 (×9): qty 3

## 2017-03-01 MED ORDER — TRAZODONE HCL 50 MG PO TABS: 100.0000 mg | ORAL_TABLET | Freq: Every day | ORAL | Status: DC

## 2017-03-01 MED ORDER — CEFTRIAXONE SODIUM IN DEXTROSE 20 MG/ML IV SOLN: 1.0000 g | INTRAVENOUS | Status: DC

## 2017-03-01 NOTE — ED Triage Notes (Signed)
Pt comes via [facility] with c/o generalized weakness. EMS states pt has been unable to get out bed since 11pm last night. Pt states cough and runny nose, but denies N/V. Pt wears O2 2L at home, but EMS placed pt on 3L bc O2 currently 88%. BP 165/88, HR 82, temp 100.4, BS 134. EMS states pt was given 325 acetamp. Pt respirations labored at this time.

## 2017-03-01 NOTE — Progress Notes (Addendum)
Pharmacy Antibiotic Note  Collin Gonzales is a 82 y.o. male admitted on 03/01/2017 with pneumonia/sepsis.  Pharmacy has been consulted for vancomycin and cefepime dosing. Patient has procalcitonin and MRSA PCR pending.  Plan: Vancomycin 750mg  IV Q24hr with next scheduled dose at 0800 on 1/10. Goal trough is 15-20. Will obtain trough prior to 5th dose.   Cefepime 2g IV Q24hr.   Height: 5\' 8"  (172.7 cm) Weight: 120 lb (54.4 kg) IBW/kg (Calculated) : 68.4  Temp (24hrs), Avg:98.5 F (36.9 C), Min:98.5 F (36.9 C), Max:98.5 F (36.9 C)  Recent Labs  Lab 03/01/17 1906 03/01/17 1907  WBC 3.5*  --   CREATININE 1.25*  --   LATICACIDVEN  --  2.0*    Estimated Creatinine Clearance: 36.9 mL/min (A) (by C-G formula based on SCr of 1.25 mg/dL (H)).    Allergies  Allergen Reactions  . Plaquenil [Hydroxychloroquine]   . Simvastatin Other (See Comments)    Bad dreams    Antimicrobials this admission: Cefepime 1/9 >>  Vancomycin 1/9 >>   Dose adjustments this admission: N/A  Microbiology results 1/9 BCx: pending  1/9 MRSA PCR: pending   Thank you for allowing pharmacy to be a part of this patient's care.  Simpson,Michael L 03/01/2017 8:04 PM    01/09 vanc and cefepime consults canceled by admitting MD. Orders for these abx and levels d/c. Will continue on ceftriaxone and azithromycin per MD note.  Sim Boast, PharmD, BCPS  03/01/17 9:41 PM

## 2017-03-01 NOTE — ED Notes (Signed)
Pt placed on 3L, O2 was at 88-89% without O2, O2 applied and pt currently at 98%. MD aware

## 2017-03-01 NOTE — ED Provider Notes (Signed)
[facility] Emergency Department Provider Note  ____________________________________________   First MD Initiated Contact with Patient 03/01/17 1844     (approximate)  I have reviewed the triage vital signs and the nursing notes.   HISTORY  Chief Complaint Weakness   HPI Collin Gonzales is a 82 y.o. male with a history of COPD on baseline 2 L nasal emergency department today with weakness over the past 24 hours.  EMS reported the patient to have a temperature of 100.4 and gave him 325 mg of acetaminophen.  They also increased his 2 L nasal cannula 3 L of nasal cannula oxygen because of hypoxia to the high 80s in route.  The patient is denying any difficulty breathing at this time.  He says that he normally has an increased work of breathing and occasionally has a cough which he says is his baseline.  He is denying any pain.  Denies any burning with urination.  Denies any known sick contact or fever.  Past Medical History:  Diagnosis Date  . Arthritis    rheumatoid arthritis  . Cancer ([place])   . Collagen vascular disease ([place])   . COPD (chronic obstructive pulmonary disease) ([place])   . GERD (gastroesophageal reflux disease)   . History of prostate cancer   . HOH (hard of hearing)   . Insomnia   . Lung cancer ([place])   . Oxygen deficit    2L HS AND PRN  . Stroke ([place])    2000  . Wheezing     Patient Active Problem List   Diagnosis Date Noted  . UTI (urinary tract infection) 09/24/2016  . Absolute anemia 09/06/2016  . Cataract 09/06/2016  . Abnormal chest x-ray 09/06/2016  . Drug-induced folate deficiency anemia 09/06/2016  . Esophagitis, reflux 09/06/2016  . Hypogammaglobulinemia ([place]) 09/06/2016  . Decreased leukocytes 09/06/2016  . Pneumococcal vaccination given 09/06/2016  . Iron deficiency anemia due to chronic blood loss 08/23/2016  . Pancytopenia ([place]) 08/22/2016  . History of stroke with residual effects 06/06/2016  . Productive cough  02/29/2016  . History of prostate cancer 09/07/2015  . Acute colitis 09/01/2015  . Bloody diarrhea 09/01/2015  . Decrease in appetite 09/01/2015  . Colitis 08/18/2015  . GERD (gastroesophageal reflux disease) 08/05/2015  . Adenopathy   . Candidate for statin therapy due to risk of future cardiovascular event 12/08/2014  . Elevated serum globulin level 12/08/2014  . Annual physical exam 11/21/2014  . COPD (chronic obstructive pulmonary disease) ([place]) 08/20/2014  . Dyslipidemia 08/20/2014  . Acid reflux 08/20/2014  . Cerebrovascular accident, old 08/20/2014  . Personal history of malignant neoplasm of prostate 08/20/2014  . Chronic insomnia 08/20/2014  . Lung mass 08/20/2014  . Arthritis or polyarthritis, rheumatoid ([place]) 08/20/2014  . Prostate cancer ([place]) 11/01/2013  . Flutter-fibrillation 03/18/2005    Past Surgical History:  Procedure Laterality Date  . APPENDECTOMY  1965  . CATARACT EXTRACTION W/PHACO Right 01/05/2016   Procedure: CATARACT EXTRACTION PHACO AND INTRAOCULAR LENS PLACEMENT (IOC);  Surgeon: [name], MD;  Location: [facility] ORS;  Service: Ophthalmology;  Laterality: Right;  [place] 1.14 AP% 18.1 CDE 13.44 FLUID PACK LOT # [id number] H  . ENDOBRONCHIAL ULTRASOUND N/A 02/26/2015   Procedure: ENDOBRONCHIAL ULTRASOUND;  Surgeon: Flora Lipps, MD;  Location: [facility] ORS;  Service: Cardiopulmonary;  Laterality: N/A;  . [place]    Prior to Admission medications   Medication Sig Start Date End Date Taking? Authorizing Provider  albuterol (PROAIR HFA) 108 (90 Base) MCG/ACT  inhaler Inhale 2 puffs into the lungs every 6 (six) hours as needed for wheezing. 11/01/16   Roselee Nova, MD  aspirin EC 81 MG tablet Take 81 mg by mouth daily.    [provider]  atorvastatin (LIPITOR) 10 MG tablet TAKE 1 TABLET (10 MG TOTAL) BY MOUTH DAILY AT 6 PM. 12/26/16   Roselee Nova, MD  B Complex-C (B-COMPLEX WITH VITAMIN C) tablet Take 1 tablet by mouth daily.     [provider]  Fluticasone-Salmeterol (ADVAIR DISKUS) 250-50 MCG/DOSE AEPB Inhale 1 puff into the lungs every morning.  05/20/14   [provider]  folic acid (FOLVITE) 1 MG tablet Take 1 tablet (1 mg total) by mouth daily. 08/24/16   Lloyd Huger, MD  methotrexate 2.5 MG tablet Take 20 mg by mouth every Tuesday. With Supper    [provider]  mirtazapine (REMERON) 15 MG tablet Take 1 tablet (15 mg total) by mouth at bedtime. 12/02/15   Roselee Nova, MD  omeprazole (PRILOSEC) 40 MG capsule Take 1 capsule (40 mg total) by mouth daily. 08/05/15   Roselee Nova, MD  tamsulosin (FLOMAX) 0.4 MG CAPS capsule Take 1 capsule (0.4 mg total) by mouth daily. 02/02/17   [name], MD  tiotropium (SPIRIVA HANDIHALER) 18 MCG inhalation capsule Place into inhaler and inhale 2 (two) times daily.  05/20/14   [provider]  traZODone (DESYREL) 100 MG tablet Take 1 tablet (100 mg total) by mouth at bedtime. 01/31/17   Roselee Nova, MD    Allergies Plaquenil [hydroxychloroquine] and Simvastatin  Family History  Problem Relation Age of Onset  . Cancer Mother   . Cancer Father        Lung Cancer  . Cancer Sister   . Cancer Brother     Social History Social History   Tobacco Use  . Smoking status: Former Smoker    Last attempt to quit: 02/21/2013    Years since quitting: 4.0  . Smokeless tobacco: Never Used  Substance Use Topics  . Alcohol use: No    Alcohol/week: 0.0 oz  . Drug use: No    Review of Systems  Constitutional: No fever/chills Eyes: No visual changes. ENT: No sore throat. Cardiovascular: Denies chest pain. Respiratory: as above Gastrointestinal: No abdominal pain.  No nausea, no vomiting.  No diarrhea.  No constipation. Genitourinary: Negative for dysuria. Musculoskeletal: Negative for back pain. Skin: Negative for rash. Neurological: Negative for headaches, focal weakness or  numbness.   ____________________________________________   PHYSICAL EXAM:  VITAL SIGNS: ED Triage Vitals  Enc Vitals Group     BP --      Pulse Rate 03/01/17 1831 87     Resp 03/01/17 1831 20     Temp 03/01/17 1831 98.5 F (36.9 C)     Temp Source 03/01/17 1831 Oral     SpO2 03/01/17 1831 93 %     Weight 03/01/17 1845 120 lb (54.4 kg)     Height 03/01/17 1845 5\' 8"  (1.727 m)     Head Circumference --      Peak Flow --      Pain Score --      Pain Loc --      Pain Edu? --      Excl. in [place]? --     Constitutional: Alert and oriented. Well appearing and in no acute distress. Eyes: Conjunctivae are normal.  Head: Atraumatic. Nose: No  congestion/rhinnorhea. Mouth/Throat: Mucous membranes are moist.  Neck: No stridor.   Cardiovascular: Normal rate, regular rhythm. Grossly normal heart sounds.   Respiratory: Increased respiratory effort with scant wheezing to the lower bases bilaterally. Gastrointestinal: Soft and nontender. No distention.  Musculoskeletal: No lower extremity tenderness nor edema.  No joint effusions. Neurologic:  Normal speech and language. No gross focal neurologic deficits are appreciated. Skin:  Skin is warm, dry and intact. No rash noted. Psychiatric: Mood and affect are normal. Speech and behavior are normal.  ____________________________________________   LABS (all labs ordered are listed, but only abnormal results are displayed)  Labs Reviewed  CBC WITH DIFFERENTIAL/PLATELET - Abnormal; Notable for the following components:      Result Value   WBC 3.5 (*)    RBC 3.73 (*)    Hemoglobin 11.4 (*)    HCT 34.8 (*)    RDW 16.2 (*)    Platelets 86 (*)    Lymphs Abs 0.4 (*)    Monocytes Absolute 0.1 (*)    All other components within normal limits  CULTURE, BLOOD (ROUTINE X 2)  CULTURE, BLOOD (ROUTINE X 2)  URINE CULTURE  COMPREHENSIVE METABOLIC PANEL  URINALYSIS, ROUTINE W REFLEX MICROSCOPIC  LACTIC ACID, PLASMA  LACTIC ACID, PLASMA   TROPONIN I  INFLUENZA PANEL BY PCR (TYPE A & B)   ____________________________________________  EKG  ED ECG REPORT I, Doran Stabler, the attending physician, personally viewed and interpreted this ECG.   Date: 03/01/2017  EKG Time: 1901  Rate: 76  Rhythm: normal sinus rhythm  Axis: Normal  Intervals:none  ST&T Change: T wave inversions in aVL as well as biphasic T waves in V2 through V5. Similar appearance to past EKG of February 24, 2015. ____________________________________________  RADIOLOGY  Increased retrocardiac left lower lobe opacity consistent with pneumonia. ____________________________________________   PROCEDURES  Procedure(s) performed:   Procedures  Critical Care performed:   ____________________________________________   INITIAL IMPRESSION / ASSESSMENT AND PLAN / ED COURSE  Pertinent labs & imaging results that were available during my care of the patient were reviewed by me and considered in my medical decision making (see chart for details).  Differential diagnosis includes, but is not limited to, alcohol, illicit or prescription medications, or other toxic ingestion; intracranial pathology such as stroke or intracerebral hemorrhage; fever or infectious causes including sepsis; hypoxemia and/or hypercarbia; uremia; trauma; endocrine related disorders such as diabetes, hypoglycemia, and thyroid-related diseases; hypertensive encephalopathy; etc. As part of my medical decision making, I reviewed the following data within the electronic MEDICAL RECORD NUMBER Notes from prior ED visits     ----------------------------------------- 7:44 PM on 03/01/2017 -----------------------------------------  Patient found to have worsening opacity in the left lower field.  Hypoxic to 88% on 3 L of nasal cannula oxygen.  Low white blood cell count and also immunosuppressed on methotrexate.  Patient will be admitted to the hospital for treatment of broad-spectrum  antibiotics.  Patient aware of need for admission.  Sepsis alert called.  Discussed the case with Dr. Anselm Jungling who accepted the patient under the medicine service. ____________________________________________   FINAL CLINICAL IMPRESSION(S) / ED DIAGNOSES  Hypoxia.  Sepsis.HCAP    NEW MEDICATIONS STARTED DURING THIS VISIT:  This SmartLink is deprecated. Use AVSMEDLIST instead to display the medication list for a patient.   Note:  This document was prepared using Dragon voice recognition software and may include unintentional dictation errors.     Orbie Pyo, MD 03/01/17 [phone]

## 2017-03-01 NOTE — H&P (Signed)
[facility] at [place] NAME: Collin Gonzales    MR#:  [id number]  DATE OF BIRTH:  March 26, 1935  DATE OF ADMISSION:  03/01/2017  PRIMARY CARE PHYSICIAN: Roselee Nova, MD   REQUESTING/REFERRING PHYSICIAN: Clearnce Hasten  CHIEF COMPLAINT:   Chief Complaint  Patient presents with  . Weakness    HISTORY OF PRESENT ILLNESS: Collin Gonzales  is a 82 y.o. male with a known history of COPD, RA on Methotraxate, Lung cancer- s/p radiation, Stroke- was suddenly very weak this morning when woke up. Also have some cough and sputum production. Denies any fever. WBCs are not high, but he is on methotraxate and requiring oxygen here in ER, with tachypnea, so ER physician suggest to admit for pneumonia. He is on 2 ltr oxygen at baseline.  He denies difficulty swallowing any food.  PAST MEDICAL HISTORY:   Past Medical History:  Diagnosis Date  . Arthritis    rheumatoid arthritis  . Cancer ([place])   . Collagen vascular disease ([place])   . COPD (chronic obstructive pulmonary disease) ([place])   . GERD (gastroesophageal reflux disease)   . History of prostate cancer   . HOH (hard of hearing)   . Insomnia   . Lung cancer ([place])   . Oxygen deficit    2L HS AND PRN  . Stroke ([place])    2000  . Wheezing     PAST SURGICAL HISTORY:  Past Surgical History:  Procedure Laterality Date  . APPENDECTOMY  1965  . CATARACT EXTRACTION W/PHACO Right 01/05/2016   Procedure: CATARACT EXTRACTION PHACO AND INTRAOCULAR LENS PLACEMENT (IOC);  Surgeon: [name], MD;  Location: [facility] ORS;  Service: Ophthalmology;  Laterality: Right;  [place] 1.14 AP% 18.1 CDE 13.44 FLUID PACK LOT # [id number] H  . ENDOBRONCHIAL ULTRASOUND N/A 02/26/2015   Procedure: ENDOBRONCHIAL ULTRASOUND;  Surgeon: Flora Lipps, MD;  Location: [facility] ORS;  Service: Cardiopulmonary;  Laterality: N/A;  . HERNIA REPAIR  1965    SOCIAL HISTORY:  Social History   Tobacco Use  . Smoking status: Former Smoker    Last  attempt to quit: 02/21/2013    Years since quitting: 4.0  . Smokeless tobacco: Never Used  Substance Use Topics  . Alcohol use: No    Alcohol/week: 0.0 oz    FAMILY HISTORY:  Family History  Problem Relation Age of Onset  . Cancer Mother   . Cancer Father        Lung Cancer  . Cancer Sister   . Cancer Brother     DRUG ALLERGIES:  Allergies  Allergen Reactions  . Plaquenil [Hydroxychloroquine]   . Simvastatin Other (See Comments)    Bad dreams    REVIEW OF SYSTEMS:   CONSTITUTIONAL: No fever,positive for fatigue or weakness.  EYES: No blurred or double vision.  EARS, NOSE, AND THROAT: No tinnitus or ear pain.  RESPIRATORY: have cough, shortness of breath, wheezing or hemoptysis. Have yellowish sputum. CARDIOVASCULAR: No chest pain, orthopnea, edema.  GASTROINTESTINAL: No nausea, vomiting, diarrhea or abdominal pain.  GENITOURINARY: No dysuria, hematuria.  ENDOCRINE: No polyuria, nocturia,  HEMATOLOGY: No anemia, easy bruising or bleeding SKIN: No rash or lesion. MUSCULOSKELETAL: No joint pain or arthritis.   NEUROLOGIC: No tingling, numbness, weakness.  PSYCHIATRY: No anxiety or depression.   MEDICATIONS AT HOME:  Prior to Admission medications   Medication Sig Start Date End Date Taking? Authorizing Provider  albuterol (PROAIR HFA) 108 (90 Base) MCG/ACT inhaler Inhale 2 puffs into the lungs  every 6 (six) hours as needed for wheezing. 11/01/16  Yes Roselee Nova, MD  aspirin EC 81 MG tablet Take 81 mg by mouth daily.   Yes [provider]  atorvastatin (LIPITOR) 10 MG tablet TAKE 1 TABLET (10 MG TOTAL) BY MOUTH DAILY AT 6 PM. 12/26/16  Yes Roselee Nova, MD  B Complex-C (B-COMPLEX WITH VITAMIN C) tablet Take 1 tablet by mouth daily.   Yes [provider]  Fluticasone-Salmeterol (ADVAIR DISKUS) 250-50 MCG/DOSE AEPB Inhale 1 puff into the lungs every morning.  05/20/14  Yes [provider]  folic acid (FOLVITE) 1 MG tablet Take 1 tablet (1  mg total) by mouth daily. 08/24/16  Yes Lloyd Huger, MD  methotrexate 2.5 MG tablet Take 20 mg by mouth every Tuesday. With Supper   Yes [provider]  mirtazapine (REMERON) 15 MG tablet Take 1 tablet (15 mg total) by mouth at bedtime. 12/02/15  Yes Roselee Nova, MD  omeprazole (PRILOSEC) 40 MG capsule Take 1 capsule (40 mg total) by mouth daily. 08/05/15  Yes Roselee Nova, MD  tamsulosin (FLOMAX) 0.4 MG CAPS capsule Take 1 capsule (0.4 mg total) by mouth daily. 02/02/17  Yes [name], MD  tiotropium (SPIRIVA HANDIHALER) 18 MCG inhalation capsule Place into inhaler and inhale 2 (two) times daily.  05/20/14  Yes [provider]  traZODone (DESYREL) 100 MG tablet Take 1 tablet (100 mg total) by mouth at bedtime. 01/31/17  Yes Rochel Brome A, MD      PHYSICAL EXAMINATION:   VITAL SIGNS: Blood pressure (!) 157/82, pulse 77, temperature 98.5 F (36.9 C), temperature source Oral, resp. rate (!) 30, height 5\' 8"  (1.727 m), weight 54.4 kg (120 lb), SpO2 95 %.  GENERAL:  82 y.o.-year-old thin patient lying in the bed with no acute distress.  EYES: Pupils equal, round, reactive to light and accommodation. No scleral icterus. Extraocular muscles intact.  HEENT: Head atraumatic, normocephalic. Oropharynx and nasopharynx clear.  NECK:  Supple, no jugular venous distention. No thyroid enlargement, no tenderness.  LUNGS: Normal breath sounds bilaterally, no wheezing, some crepitation. No use of accessory muscles of respiration.  CARDIOVASCULAR: S1, S2 normal. No murmurs, rubs, or gallops.  ABDOMEN: Soft, nontender, nondistended. Bowel sounds present. No organomegaly or mass.  EXTREMITIES: No pedal edema, cyanosis, or clubbing.  NEUROLOGIC: Cranial nerves II through XII are intact. Muscle strength 4-5/5 in all extremities. Sensation intact. Gait not checked.  PSYCHIATRIC: The patient is alert and oriented x 3.  SKIN: No obvious rash, lesion, or ulcer.    LABORATORY PANEL:   CBC Recent Labs  Lab 03/01/17 1906  WBC 3.5*  HGB 11.4*  HCT 34.8*  PLT 86*  MCV 93.4  MCH 30.5  MCHC 32.6  RDW 16.2*  LYMPHSABS 0.4*  MONOABS 0.1*  EOSABS 0.0  BASOSABS 0.0   ------------------------------------------------------------------------------------------------------------------  Chemistries  Recent Labs  Lab 03/01/17 1906  NA 136  K 3.6  CL 100*  CO2 27  GLUCOSE 108*  BUN 22*  CREATININE 1.25*  CALCIUM 8.5*  AST 29  ALT 11*  ALKPHOS 83  BILITOT 0.6   ------------------------------------------------------------------------------------------------------------------ estimated creatinine clearance is 36.9 mL/min (A) (by C-G formula based on SCr of 1.25 mg/dL (H)). ------------------------------------------------------------------------------------------------------------------ No results for input(s): TSH, T4TOTAL, T3FREE, THYROIDAB in the last 72 hours.  Invalid input(s): FREET3   Coagulation profile No results for input(s): INR, PROTIME in the last 168 hours. ------------------------------------------------------------------------------------------------------------------- No results for input(s):  DDIMER in the last 72 hours. -------------------------------------------------------------------------------------------------------------------  Cardiac Enzymes Recent Labs  Lab 03/01/17 1906  TROPONINI <0.03   ------------------------------------------------------------------------------------------------------------------ Invalid input(s): POCBNP  ---------------------------------------------------------------------------------------------------------------  Urinalysis    Component Value Date/Time   COLORURINE YELLOW (A) 01/19/2017 0050   APPEARANCEUR CLEAR (A) 01/19/2017 0050   LABSPEC 1.017 01/19/2017 0050   PHURINE 6.0 01/19/2017 0050   GLUCOSEU NEGATIVE 01/19/2017 0050   HGBUR SMALL (A) 01/19/2017 0050    BILIRUBINUR NEGATIVE 01/19/2017 0050   KETONESUR NEGATIVE 01/19/2017 0050   PROTEINUR 30 (A) 01/19/2017 0050   NITRITE NEGATIVE 01/19/2017 0050   LEUKOCYTESUR SMALL (A) 01/19/2017 0050     RADIOLOGY: Dg Chest Port 1 View  Result Date: 03/01/2017 CLINICAL DATA:  Generalized weakness. Cough and runny nose. On home oxygen. EXAM: PORTABLE CHEST 1 VIEW COMPARISON:  CT 07/25/2016.  Radiographs 04/08/2016. FINDINGS: 1838 hr. Two views obtained. The heart size and mediastinal contours are stable. There is severe emphysema with stable asymmetric right apical scarring. There is increased density in the retrocardiac left lower lobe which could reflect pneumonia. No evidence of pneumothorax or significant pleural effusion. The bones appear unchanged. IMPRESSION: Increased retrocardiac left lower lobe opacity compared with prior studies, suspicious for pneumonia, possibly on the basis of aspiration. Severe emphysema. Electronically Signed   By: Richardean Sale M.D.   On: 03/01/2017 19:08    EKG: Orders placed or performed during the hospital encounter of 03/01/17  . ED EKG 12-Lead  . ED EKG 12-Lead  . EKG 12-Lead  . EKG 12-Lead    IMPRESSION AND PLAN:  * Pneumonia   Xray evidence   WBCs are not high, But pt have sputum and generalized weakness.   Influenza is negative.   UA is not checked, send the sample.   Bl cx sent.   IV rocephin+ azithromycin   ER gave Vanc + cefepime - suspecting sepsis, but I disagree with sepsis.   Encourage incentive spirometer.  * generalized weakness   PT eval.  * RA   Cotn MTx.  * COPD   Cont spiriva and keep on Duoneb.  * Ac renal failure   IV fluids and monitor.  All the records are reviewed and case discussed with ED provider. Management plans discussed with the patient, family and they are in agreement.  CODE STATUS: DNR Code Status History    Date Active Date Inactive Code Status Order ID Comments User Context   08/18/2015 13:12 08/20/2015 14:37  DNR [id number]  Loletha Grayer, MD ED    Questions for Most Recent Historical Code Status (Order [id number])    Question Answer Comment   In the event of cardiac or respiratory ARREST Do not call a "code blue"    In the event of cardiac or respiratory ARREST Do not perform Intubation, CPR, defibrillation or ACLS    In the event of cardiac or respiratory ARREST Use medication by any route, position, wound care, and other measures to relive pain and suffering. May use oxygen, suction and manual treatment of airway obstruction as needed for comfort.    Comments nurse may pronounce        TOTAL TIME TAKING CARE OF THIS PATIENT: 45 minutes.    Vaughan Basta M.D on 03/01/2017   Between 7am to 6pm - Pager - [phone]  After 6pm go to [email or web address] - password [email or web address] [facility]  Office  [phone]  CC: Primary care physician; Roselee Nova, MD   Note: This dictation was prepared with  Dragon dictation along with smaller Company secretary. Any transcriptional errors that result from this process are unintentional.

## 2017-03-01 NOTE — ED Notes (Signed)
Pt cleaned up and attempted to collect urine specimen. Pt notified of urine specimen needed and to call for assistance

## 2017-03-01 NOTE — Progress Notes (Signed)
CODE SEPSIS - PHARMACY COMMUNICATION  **Broad Spectrum Antibiotics should be administered within 1 hour of Sepsis diagnosis**  Time Code Sepsis Called/Page Received: 19:35  Antibiotics Ordered: 2000  Time of 1st antibiotic administration: Cefepime and Vancomycin  Additional action taken by pharmacy: none   If necessary, Name of Provider/Nurse Contacted: Bary Leriche ,PharmD Clinical Pharmacist  03/01/2017  8:07 PM

## 2017-03-01 NOTE — ED Notes (Signed)
Patient transported to 132

## 2017-03-01 NOTE — ED Notes (Signed)
Date and time results received: 03/01/17 1935 (use smartphrase ".now" to insert current time)  Test: lactic Critical Value: 2.0  Name of Provider Notified: Md [place]  Orders Received? Or Actions Taken?: Orders Received - See Orders for details

## 2017-03-02 DIAGNOSIS — Z8673 Personal history of transient ischemic attack (TIA), and cerebral infarction without residual deficits: Secondary | ICD-10-CM | POA: Diagnosis not present

## 2017-03-02 DIAGNOSIS — Z961 Presence of intraocular lens: Secondary | ICD-10-CM | POA: Diagnosis present

## 2017-03-02 DIAGNOSIS — N390 Urinary tract infection, site not specified: Secondary | ICD-10-CM | POA: Diagnosis present

## 2017-03-02 DIAGNOSIS — Z9981 Dependence on supplemental oxygen: Secondary | ICD-10-CM | POA: Diagnosis not present

## 2017-03-02 DIAGNOSIS — Z79899 Other long term (current) drug therapy: Secondary | ICD-10-CM | POA: Diagnosis not present

## 2017-03-02 DIAGNOSIS — J159 Unspecified bacterial pneumonia: Secondary | ICD-10-CM | POA: Diagnosis present

## 2017-03-02 DIAGNOSIS — Z23 Encounter for immunization: Secondary | ICD-10-CM | POA: Diagnosis present

## 2017-03-02 DIAGNOSIS — Z85118 Personal history of other malignant neoplasm of bronchus and lung: Secondary | ICD-10-CM | POA: Diagnosis not present

## 2017-03-02 DIAGNOSIS — Z7982 Long term (current) use of aspirin: Secondary | ICD-10-CM | POA: Diagnosis not present

## 2017-03-02 DIAGNOSIS — N179 Acute kidney failure, unspecified: Secondary | ICD-10-CM | POA: Diagnosis present

## 2017-03-02 DIAGNOSIS — K219 Gastro-esophageal reflux disease without esophagitis: Secondary | ICD-10-CM | POA: Diagnosis present

## 2017-03-02 DIAGNOSIS — Z9841 Cataract extraction status, right eye: Secondary | ICD-10-CM | POA: Diagnosis not present

## 2017-03-02 DIAGNOSIS — J189 Pneumonia, unspecified organism: Secondary | ICD-10-CM | POA: Diagnosis present

## 2017-03-02 DIAGNOSIS — Z8546 Personal history of malignant neoplasm of prostate: Secondary | ICD-10-CM | POA: Diagnosis not present

## 2017-03-02 DIAGNOSIS — Z87891 Personal history of nicotine dependence: Secondary | ICD-10-CM | POA: Diagnosis not present

## 2017-03-02 DIAGNOSIS — Y95 Nosocomial condition: Secondary | ICD-10-CM | POA: Diagnosis present

## 2017-03-02 DIAGNOSIS — A419 Sepsis, unspecified organism: Secondary | ICD-10-CM | POA: Diagnosis present

## 2017-03-02 DIAGNOSIS — J440 Chronic obstructive pulmonary disease with acute lower respiratory infection: Secondary | ICD-10-CM | POA: Diagnosis present

## 2017-03-02 DIAGNOSIS — G4700 Insomnia, unspecified: Secondary | ICD-10-CM | POA: Diagnosis present

## 2017-03-02 DIAGNOSIS — R0902 Hypoxemia: Secondary | ICD-10-CM | POA: Diagnosis present

## 2017-03-02 DIAGNOSIS — H9190 Unspecified hearing loss, unspecified ear: Secondary | ICD-10-CM | POA: Diagnosis present

## 2017-03-02 DIAGNOSIS — M069 Rheumatoid arthritis, unspecified: Secondary | ICD-10-CM | POA: Diagnosis present

## 2017-03-02 DIAGNOSIS — J9621 Acute and chronic respiratory failure with hypoxia: Secondary | ICD-10-CM | POA: Diagnosis present

## 2017-03-02 DIAGNOSIS — Z923 Personal history of irradiation: Secondary | ICD-10-CM | POA: Diagnosis not present

## 2017-03-02 DIAGNOSIS — Z7952 Long term (current) use of systemic steroids: Secondary | ICD-10-CM | POA: Diagnosis not present

## 2017-03-02 LAB — BASIC METABOLIC PANEL
ANION GAP: 8 (ref 5–15)
BUN: 19 mg/dL (ref 6–20)
CALCIUM: 8.3 mg/dL — AB (ref 8.9–10.3)
CO2: 27 mmol/L (ref 22–32)
CREATININE: 1.03 mg/dL (ref 0.61–1.24)
Chloride: 102 mmol/L (ref 101–111)
GFR calc Af Amer: >60 mL/min (ref >60)
GLUCOSE: 106 mg/dL — AB (ref 65–99)
Potassium: 3.7 mmol/L (ref 3.5–5.1)
Sodium: 137 mmol/L (ref 135–145)

## 2017-03-02 LAB — MRSA PCR SCREENING: MRSA by PCR: NEGATIVE

## 2017-03-02 LAB — CBC
HEMATOCRIT: 33.7 % — AB (ref 40.0–52.0)
Hemoglobin: 10.8 g/dL — ABNORMAL LOW (ref 13.0–18.0)
MCH: 30.3 pg (ref 26.0–34.0)
MCHC: 32.1 g/dL (ref 32.0–36.0)
MCV: 94.2 fL (ref 80.0–100.0)
Platelets: 81 10*3/uL — ABNORMAL LOW (ref 150–440)
RBC: 3.58 MIL/uL — ABNORMAL LOW (ref 4.40–5.90)
RDW: 16.7 % — AB (ref 11.5–14.5)
WBC: 3.7 10*3/uL — ABNORMAL LOW (ref 3.8–10.6)

## 2017-03-02 MED ORDER — NYSTATIN 100000 UNIT/GM EX POWD
Freq: Three times a day (TID) | CUTANEOUS | Status: DC
  Administered 2017-03-02 – 2017-03-04 (×7): via TOPICAL
  Filled 2017-03-02: qty 15

## 2017-03-02 MED ORDER — AZITHROMYCIN 500 MG PO TABS
250.0000 mg | ORAL_TABLET | Freq: Every day | ORAL | Status: DC
  Administered 2017-03-02 – 2017-03-04 (×3): 250 mg via ORAL
  Filled 2017-03-02 (×3): qty 1

## 2017-03-02 NOTE — Progress Notes (Signed)
[facility] [place] NAME: Collin Gonzales    MR#:  [id number]  DATE OF BIRTH:  [birth year]  SUBJECTIVE:  Patient came in with increasing shortness of breath.  He had some productive cough.  Found to have pneumonia.  Feels a little better.  REVIEW OF SYSTEMS:   Review of Systems  Constitutional: Negative for chills, fever and weight loss.  HENT: Negative for ear discharge, ear pain and nosebleeds.   Eyes: Negative for blurred vision, pain and discharge.  Respiratory: Positive for cough, sputum production and shortness of breath. Negative for wheezing and stridor.   Cardiovascular: Negative for chest pain, palpitations, orthopnea and PND.  Gastrointestinal: Negative for abdominal pain, diarrhea, nausea and vomiting.  Genitourinary: Negative for frequency and urgency.  Musculoskeletal: Negative for back pain and joint pain.  Neurological: Positive for weakness. Negative for sensory change, speech change and focal weakness.  Psychiatric/Behavioral: Negative for depression and hallucinations. The patient is not nervous/anxious.    Tolerating Diet:yes Tolerating PT: REHAB  DRUG ALLERGIES:   Allergies  Allergen Reactions  . Plaquenil [Hydroxychloroquine]   . Simvastatin Other (See Comments)    Bad dreams    VITALS:  Blood pressure 137/62, pulse 82, temperature 98.7 F (37.1 C), temperature source Oral, resp. rate 20, height 5\' 8"  (1.727 m), weight 72.3 kg (159 lb 8 oz), SpO2 96 %.  PHYSICAL EXAMINATION:   Physical Exam  GENERAL:  82 y.o.-year-old patient lying in the bed with no acute distress.  EYES: Pupils equal, round, reactive to light and accommodation. No scleral icterus. Extraocular muscles intact.  HEENT: Head atraumatic, normocephalic. Oropharynx and nasopharynx clear.  NECK:  Supple, no jugular venous distention. No thyroid enlargement, no tenderness.  LUNGS:  Coarse breath sounds bilaterally, no wheezing, rales,  rhonchi. No use of accessory muscles of respiration.  CARDIOVASCULAR: S1, S2 normal. No murmurs, rubs, or gallops.  ABDOMEN: Soft, nontender, nondistended. Bowel sounds present. No organomegaly or mass.  EXTREMITIES: No cyanosis, clubbing or edema b/l.    NEUROLOGIC: Cranial nerves II through XII are intact. No focal Motor or sensory deficits b/l.  weak PSYCHIATRIC:  patient is alert and oriented x 3.  SKIN: No obvious rash, lesion, or ulcer.   LABORATORY PANEL:  CBC Recent Labs  Lab 03/02/17 0406  WBC 3.7*  HGB 10.8*  HCT 33.7*  PLT 81*    Chemistries  Recent Labs  Lab 03/01/17 1906 03/02/17 0406  NA 136 137  K 3.6 3.7  CL 100* 102  CO2 27 27  GLUCOSE 108* 106*  BUN 22* 19  CREATININE 1.25* 1.03  CALCIUM 8.5* 8.3*  AST 29  --   ALT 11*  --   ALKPHOS 83  --   BILITOT 0.6  --    Cardiac Enzymes Recent Labs  Lab 03/01/17 1906  TROPONINI <0.03   RADIOLOGY:  Dg Chest Port 1 View  Result Date: 03/01/2017 CLINICAL DATA:  Generalized weakness. Cough and runny nose. On home oxygen. EXAM: PORTABLE CHEST 1 VIEW COMPARISON:  CT 07/25/2016.  Radiographs 04/08/2016. FINDINGS: 1838 hr. Two views obtained. The heart size and mediastinal contours are stable. There is severe emphysema with stable asymmetric right apical scarring. There is increased density in the retrocardiac left lower lobe which could reflect pneumonia. No evidence of pneumothorax or significant pleural effusion. The bones appear unchanged. IMPRESSION: Increased retrocardiac left lower lobe opacity compared with prior studies, suspicious for pneumonia, possibly on the basis of aspiration. Severe emphysema.  Electronically Signed   By: Richardean Sale M.D.   On: 03/01/2017 19:08   ASSESSMENT AND PLAN:   Collin Gonzales  is a 82 y.o. male with a known history of COPD, RA on Methotraxate, Lung cancer- s/p radiation, Stroke- was suddenly very weak this morning when woke up. Also have some cough and sputum production.  Denies any fever.  *1.Community-acquired pneumonia and UTI -Continue IV Rocephin and Zithromax -Blood cultures negative -Influenza negative -Patient afebrile  2.  COPD acute on chronic mild exacerbation -Continue nebulizer, inhaler - patient uses chronic home oxygen  3.  Rheumatoid arthritis -Continue methotrexate  4.Acute renal failure Got IVF  And doing fine   Case discussed with Care Management/Social Worker. Management plans discussed with the patient, family and they are in agreement.  CODE STATUS: full  DVT Prophylaxis: lovenox  TOTAL TIME TAKING CARE OF THIS PATIENT: 25 minutes.  >50% time spent on counselling and coordination of care  POSSIBLE D/C IN 1-2DAYS, DEPENDING ON CLINICAL CONDITION.  Note: This dictation was prepared with Dragon dictation along with smaller phrase technology. Any transcriptional errors that result from this process are unintentional.  Fritzi Mandes M.D on 03/02/2017 at 1:43 PM  Between 7am to 6pm - Pager - [phone]  After 6pm go to [email or web address] - password [email or web address] [place] [facility]  Office  [phone]  CC: Primary care physician; Roselee Nova, MDPatient ID: Collin Gonzales, male   DOB: Sep 28, 1935, 82 y.o.   MRN: [id number]

## 2017-03-02 NOTE — Clinical Social Work Placement (Signed)
   CLINICAL SOCIAL WORK PLACEMENT  NOTE  Date:  03/02/2017  Patient Details  Name: Collin Gonzales MRN: [id number] Date of Birth: 04-12-1935  Clinical Social Work is seeking post-discharge placement for this patient at the [place] level of care (*CSW will initial, date and re-position this form in  chart as items are completed):  Yes   Patient/family provided with [facility] Work Department's list of facilities offering this level of care within the geographic area requested by the patient (or if unable, by the patient's family).  Yes   Patient/family informed of their freedom to choose among providers that offer the needed level of care, that participate in Medicare, Medicaid or managed care program needed by the patient, have an available bed and are willing to accept the patient.  Yes   Patient/family informed of [facility]'s ownership interest in [facility] and [facility], as well as of the fact that they are under no obligation to receive care at these facilities.  PASRR submitted to EDS on 03/02/17     PASRR number received on 03/02/17     Existing PASRR number confirmed on       FL2 transmitted to all facilities in geographic area requested by pt/family on 03/02/17     FL2 transmitted to all facilities within larger geographic area on       Patient informed that his/her managed care company has contracts with or will negotiate with certain facilities, including the following:        Yes   Patient/family informed of bed offers received.  Patient chooses bed at ([facility] )     Physician recommends and patient chooses bed at      Patient to be transferred to   on  .  Patient to be transferred to facility by       Patient family notified on   of transfer.  Name of family member notified:        PHYSICIAN       Additional Comment:    _______________________________________________ Anntonette Madewell, Veronia Beets, LCSW 03/02/2017, 3:43  PM

## 2017-03-02 NOTE — Evaluation (Signed)
Physical Therapy Evaluation Patient Details Name: Collin Gonzales MRN: [id number] DOB: 10/22/1935 Today's Date: 03/02/2017   History of Present Illness  Pt admitted for complaints of weakness and now diagnosed with CAP. History includes COPD, RA, lung cancer, CVA, and GERD. Pt reports he recently had a fall earlier this week.  Clinical Impression  Pt is a pleasant 82 year old male who was admitted for CAP. Pt reports he wears 2L of O2 at home only when he needs it. Presents with 2L of O2 donned lying sideways across bed. Pt required physical assist for positioning in bed. Pt performs bed mobility with mod assist, transfers with min assist, and ambulation with min assist at bedside. O2 donned for all mobility, however pt easily fatigues and sats decrease to 86%.  Pt demonstrates deficits with strength/mobility/endurance/balance. Pt currently not at baseline level. Would benefit from skilled PT to address above deficits and promote optimal return to PLOF; recommend transition to STR upon discharge from acute hospitalization.       Follow Up Recommendations SNF    Equipment Recommendations  Rolling walker with 5" wheels    Recommendations for Other Services       Precautions / Restrictions Precautions Precautions: Fall Restrictions Weight Bearing Restrictions: No      Mobility  Bed Mobility Overal bed mobility: Needs Assistance Bed Mobility: Supine to Sit     Supine to sit: Mod assist     General bed mobility comments: Pt needs cues for sequencing and grimaces out in pain with movement of B hips. Once seated at EOB, able to sit with cga progressing to supervision.  Transfers Overall transfer level: Needs assistance Equipment used: Rolling walker (2 wheeled) Transfers: Sit to/from Stand Sit to Stand: Min assist         General transfer comment: needs cues for placement of hands/feet prior to transfer. Once standing, wide base of support  noted  Ambulation/Gait Ambulation/Gait assistance: Min assist Ambulation Distance (Feet): 5 Feet Assistive device: Rolling walker (2 wheeled) Gait Pattern/deviations: Step-to pattern     General Gait Details: able to side step up towards [place] with RW. Cues for sequencing given. 2L of O2 donned with sats at 86%, unable to further progress.  Stairs            Wheelchair Mobility    Modified Rankin (Stroke Patients Only)       Balance Overall balance assessment: History of Falls;Needs assistance Sitting-balance support: Feet supported Sitting balance-Leahy Scale: Good     Standing balance support: Bilateral upper extremity supported Standing balance-Leahy Scale: Fair                               Pertinent Vitals/Pain Pain Assessment: Faces Faces Pain Scale: Hurts a little bit Pain Location: B hips Pain Descriptors / Indicators: Discomfort;Dull Pain Intervention(s): Limited activity within patient's tolerance;Repositioned    Home Living Family/patient expects to be discharged to:: Private residence Living Arrangements: Alone   Type of Home: House Home Access: Level entry     Home Layout: One level Home Equipment: None      Prior Function Level of Independence: Independent         Comments: reports he usually is able to ambulate without assistance, however reports increased unsteadiness recently     Hand Dominance        Extremity/Trunk Assessment   Upper Extremity Assessment Upper Extremity Assessment: Generalized weakness(B UE grossly 3+/5)  Lower Extremity Assessment Lower Extremity Assessment: Generalized weakness(L LE grossly 3/5; R LE grossly 3+/5)       Communication   Communication: No difficulties  Cognition Arousal/Alertness: Awake/alert Behavior During Therapy: WFL for tasks assessed/performed Overall Cognitive Status: Within Functional Limits for tasks assessed                                         General Comments      Exercises Other Exercises Other Exercises: supine ther-ex performed on B LE including ankle pumps, quad sets, SLRs, and hip abd/add. ALl ther-ex performed x 10 reps with min assist on L LE and cga on R LE.  Other Exercises: Upon standing, incontinent episode noted and pt urinated all over floor. Needed min assist for changing gown/socks/clean up.   Assessment/Plan    PT Assessment Patient needs continued PT services  PT Problem List Decreased strength;Decreased activity tolerance;Decreased balance;Decreased mobility;Decreased knowledge of use of DME;Cardiopulmonary status limiting activity       PT Treatment Interventions Gait training;DME instruction;Therapeutic exercise;Balance training    PT Goals (Current goals can be found in the Care Plan section)  Acute Rehab PT Goals Patient Stated Goal: to get stronger PT Goal Formulation: With patient Time For Goal Achievement: 03/16/17 Potential to Achieve Goals: Good    Frequency Min 2X/week   Barriers to discharge Decreased caregiver support      Co-evaluation               AM-PAC PT "6 Clicks" Daily Activity  Outcome Measure Difficulty turning over in bed (including adjusting bedclothes, sheets and blankets)?: Unable Difficulty moving from lying on back to sitting on the side of the bed? : Unable Difficulty sitting down on and standing up from a chair with arms (e.g., wheelchair, bedside commode, etc,.)?: Unable Help needed moving to and from a bed to chair (including a wheelchair)?: A Lot Help needed walking in hospital room?: A Lot Help needed climbing 3-5 steps with a railing? : A Lot 6 Click Score: 9    End of Session Equipment Utilized During Treatment: Oxygen Activity Tolerance: Treatment limited secondary to medical complications (Comment) Patient left: in bed;with bed alarm set;with nursing/sitter in room Nurse Communication: Mobility status PT Visit Diagnosis: Unsteadiness on feet  (R26.81);Muscle weakness (generalized) (M62.81);History of falling (Z91.81);Difficulty in walking, not elsewhere classified (R26.2)    Time: 0881-1031 PT Time Calculation (min) (ACUTE ONLY): 26 min   Charges:   PT Evaluation $PT Eval Low Complexity: 1 Low PT Treatments $Therapeutic Exercise: 8-22 mins   PT G Codes:        Greggory Stallion, PT, DPT [phone]   Caitlinn Klinker 03/02/2017, 11:52 AM

## 2017-03-02 NOTE — Plan of Care (Signed)
  Activity: Ability to tolerate increased activity will improve 03/02/2017 0509 - Progressing by Jeffie Pollock, RN   Clinical Measurements: Ability to maintain a body temperature in the normal range will improve 03/02/2017 0509 - Progressing by Jeffie Pollock, RN   Respiratory: Ability to maintain adequate ventilation will improve 03/02/2017 0509 - Progressing by Jeffie Pollock, RN Ability to maintain a clear airway will improve 03/02/2017 0509 - Progressing by Jeffie Pollock, RN   Education: Knowledge of General Education information will improve 03/02/2017 0509 - Progressing by Jeffie Pollock, RN   Education: Knowledge of General Education information will improve 03/02/2017 0509 - Progressing by Jeffie Pollock, RN   Nutrition: Adequate nutrition will be maintained 03/02/2017 0509 - Progressing by Jeffie Pollock, RN   Pain Managment: General experience of comfort will improve 03/02/2017 0509 - Progressing by Jeffie Pollock, RN   Safety: Ability to remain free from injury will improve 03/02/2017 0509 - Progressing by Jeffie Pollock, RN

## 2017-03-02 NOTE — NC FL2 (Signed)
[place] LEVEL OF CARE SCREENING TOOL     IDENTIFICATION  Patient Name: Collin Gonzales Birthdate: [birth year] Sex: male Admission Date (Current Location): 03/01/2017  [place] and [place] Number:  Engineering geologist and Address:  [facility], [address], [place], [place] [address]      Provider Number: [id number]  Attending Physician Name and Address:  Fritzi Mandes, MD  Relative Name and Phone Number:       Current Level of Care: Hospital Recommended Level of Care: [place] Prior Approval Number:    Date Approved/Denied:   PASRR Number: [id number])  Discharge Plan: SNF    Current Diagnoses: Patient Active Problem List   Diagnosis Date Noted  . Community acquired bacterial pneumonia 03/02/2017  . Community acquired pneumonia 03/01/2017  . Pneumonia 03/01/2017  . UTI (urinary tract infection) 09/24/2016  . Absolute anemia 09/06/2016  . Cataract 09/06/2016  . Abnormal chest x-ray 09/06/2016  . Drug-induced folate deficiency anemia 09/06/2016  . Esophagitis, reflux 09/06/2016  . Hypogammaglobulinemia ([place]) 09/06/2016  . Decreased leukocytes 09/06/2016  . Pneumococcal vaccination given 09/06/2016  . Iron deficiency anemia due to chronic blood loss 08/23/2016  . Pancytopenia ([place]) 08/22/2016  . History of stroke with residual effects 06/06/2016  . Productive cough 02/29/2016  . History of prostate cancer 09/07/2015  . Acute colitis 09/01/2015  . Bloody diarrhea 09/01/2015  . Decrease in appetite 09/01/2015  . Colitis 08/18/2015  . GERD (gastroesophageal reflux disease) 08/05/2015  . Adenopathy   . Candidate for statin therapy due to risk of future cardiovascular event 12/08/2014  . Elevated serum globulin level 12/08/2014  . Annual physical exam 11/21/2014  . COPD (chronic obstructive pulmonary disease) ([place]) 08/20/2014  . Dyslipidemia 08/20/2014  . Acid reflux 08/20/2014  . Cerebrovascular  accident, old 08/20/2014  . Personal history of malignant neoplasm of prostate 08/20/2014  . Chronic insomnia 08/20/2014  . Lung mass 08/20/2014  . Arthritis or polyarthritis, rheumatoid ([place]) 08/20/2014  . Prostate cancer ([place]) 11/01/2013  . Flutter-fibrillation 03/18/2005    Orientation RESPIRATION BLADDER Height & Weight     Self, Time, Situation, Place  O2(3 Liters Oxygen. ) Incontinent Weight: 159 lb 8 oz (72.3 kg) Height:  5\' 8"  (172.7 cm)  BEHAVIORAL SYMPTOMS/MOOD NEUROLOGICAL BOWEL NUTRITION STATUS      Continent Diet(Regualr Diet)  AMBULATORY STATUS COMMUNICATION OF NEEDS Skin   Extensive Assist Verbally Normal                       Personal Care Assistance Level of Assistance  Bathing, Feeding, Dressing Bathing Assistance: Limited assistance Feeding assistance: Independent Dressing Assistance: Limited assistance     Functional Limitations Info  Sight, Hearing, Speech Sight Info: Adequate Hearing Info: Adequate Speech Info: Adequate    SPECIAL CARE FACTORS FREQUENCY  PT (By licensed PT), OT (By licensed OT)     PT Frequency: (5) OT Frequency: (5)            Contractures      Additional Factors Info  Code Status, Allergies Code Status Info: (DNR ) Allergies Info: (Plaquenil Hydroxychloroquine, Simvastatin)           Current Medications (03/02/2017):  This is the current hospital active medication list Current Facility-Administered Medications  Medication Dose Route Frequency Provider Last Rate Last Dose  . albuterol (PROVENTIL) (2.5 MG/3ML) 0.083% nebulizer solution 3 mL  3 mL Inhalation Q6H PRN Vaughan Basta, MD      . aspirin  EC tablet 81 mg  81 mg Oral Daily Vaughan Basta, MD   81 mg at 03/02/17 0853  . atorvastatin (LIPITOR) tablet 10 mg  10 mg Oral q1800 Vaughan Basta, MD      . azithromycin [facility]) tablet 250 mg  250 mg Oral Daily Fritzi Mandes, MD      . B-complex with vitamin C tablet 1 tablet  1 tablet  Oral Daily Vaughan Basta, MD   1 tablet at 03/02/17 0853  . cefTRIAXone (ROCEPHIN) 1 g in dextrose 5 % 50 mL IVPB  1 g Intravenous Q24H Vaughan Basta, MD 100 mL/hr at 03/02/17 1341 1 g at 03/02/17 1341  . docusate sodium (COLACE) capsule 100 mg  100 mg Oral BID PRN Vaughan Basta, MD      . folic acid (FOLVITE) tablet 1 mg  1 mg Oral Daily Vaughan Basta, MD   1 mg at 03/02/17 0852  . heparin injection 5,000 Units  5,000 Units Subcutaneous Q8H Vaughan Basta, MD   5,000 Units at 03/02/17 0545  . ipratropium-albuterol (DUONEB) 0.5-2.5 (3) MG/3ML nebulizer solution 3 mL  3 mL Nebulization Q4H Vaughan Basta, MD   3 mL at 03/02/17 1136  . [START ON 03/07/2017] methotrexate (RHEUMATREX) tablet 20 mg  20 mg Oral Q Ma Rings, MD      . mirtazapine (REMERON) tablet 15 mg  15 mg Oral QHS Vaughan Basta, MD   15 mg at 03/02/17 0106  . mometasone-formoterol (DULERA) 200-5 MCG/ACT inhaler 2 puff  2 puff Inhalation BID Vaughan Basta, MD   2 puff at 03/02/17 0853  . nystatin (MYCOSTATIN/NYSTOP) topical powder   Topical TID Fritzi Mandes, MD      . pantoprazole (PROTONIX) EC tablet 40 mg  40 mg Oral Daily Vaughan Basta, MD   40 mg at 03/02/17 0852  . pneumococcal 23 valent vaccine (PNU-IMMUNE) injection 0.5 mL  0.5 mL Intramuscular Tomorrow-1000 Vaughan Basta, MD      . tamsulosin (FLOMAX) capsule 0.4 mg  0.4 mg Oral Daily Vaughan Basta, MD   0.4 mg at 03/02/17 0852  . tiotropium (SPIRIVA) inhalation capsule 18 mcg  18 mcg Inhalation Daily Vaughan Basta, MD   18 mcg at 03/02/17 1344     Discharge Medications: Please see discharge summary for a list of discharge medications.  Relevant Imaging Results:  Relevant Lab Results:   Additional Information (SSN: [id number])  Tyneshia Stivers, Veronia Beets, LCSW

## 2017-03-02 NOTE — Clinical Social Work Note (Signed)
Clinical Social Work Assessment  Patient Details  Name: Collin Gonzales MRN: [id number] Date of Birth: 11/28/1935  Date of referral:  03/02/17               Reason for consult:  Facility Placement                Permission sought to share information with:  Facility Contact Representative Permission granted to share information::  Yes, Verbal Permission Granted  Name::      Skilled Nursing Facility   Agency::   [place]   Relationship::     Contact Information:     Housing/Transportation Living arrangements for the past 2 months:  Single Family Home Source of Information:  Patient Patient Interpreter Needed:  None Criminal Activity/Legal Involvement Pertinent to Current Situation/Hospitalization:  No - Comment as needed Significant Relationships:  Siblings Lives with:  Self Do you feel safe going back to the place where you live?  Yes Need for family participation in patient care:  Yes (Comment)  Care giving concerns:  Patient lives alone in an apartment in [place].    Social Worker assessment / plan:  Clinical Social Worker (CSW) reviewed chart and noted that PT is recommending SNF. CSW met with patient alone at bedside to discuss D/C plan. Patient was alert and oriented X4 and was laying in the bed. CSW introduced self and explained role of CSW department. Patient reported that he lives alone and his sister Collin Gonzales is his primary support. Per patient he has completed radiation and stated that he no longer has cancer. CSW explained SNF process and that [employer] will have to approve SNF. Patient is agreeable to SNF search in [place] and prefers [facility]. FL2 complete and faxed out.   CSW presented bed offers to patient and he chose [facility]. Per [facility] liaison he will start [employer] SNF authorization today. CSW will continue to follow and assist as needed.   Employment status:  Disabled (Comment on whether or not currently receiving Disability), Retired Insurance information:   Managed Medicare PT Recommendations:  Skilled Nursing Facility Information / Referral to community resources:  Skilled Nursing Facility  Patient/Family's Response to care:  Patient is agreeable to go to [facility].   Patient/Family's Understanding of and Emotional Response to Diagnosis, Current Treatment, and Prognosis:  Patient was very pleasant and thanked CSW for assistance.   Emotional Assessment Appearance:  Appears stated age Attitude/Demeanor/Rapport:    Affect (typically observed):  Accepting, Adaptable, Pleasant Orientation:  Oriented to Self, Oriented to Place, Oriented to  Time, Oriented to Situation Alcohol / Substance use:  Not Applicable Psych involvement (Current and /or in the community):  No (Comment)  Discharge Needs  Concerns to be addressed:  Discharge Planning Concerns Readmission within the last 30 days:  No Current discharge risk:  Dependent with Mobility Barriers to Discharge:  Continued Medical Work up   ,  M, LCSW 03/02/2017, 3:44 PM  

## 2017-03-03 ENCOUNTER — Ambulatory Visit: Payer: Medicare HMO

## 2017-03-03 ENCOUNTER — Inpatient Hospital Stay: Payer: Medicare HMO

## 2017-03-03 LAB — URINE CULTURE: Culture: <10000 — AB

## 2017-03-03 MED ORDER — IPRATROPIUM-ALBUTEROL 0.5-2.5 (3) MG/3ML IN SOLN
3.0000 mL | Freq: Three times a day (TID) | RESPIRATORY_TRACT | Status: DC
  Administered 2017-03-03 – 2017-03-04 (×3): 3 mL via RESPIRATORY_TRACT
  Filled 2017-03-03 (×3): qty 3

## 2017-03-03 MED ORDER — NYSTATIN 100000 UNIT/GM EX POWD: Freq: Three times a day (TID) | CUTANEOUS | 0 refills | Status: DC

## 2017-03-03 MED ORDER — TRAMADOL HCL 50 MG PO TABS
50.0000 mg | ORAL_TABLET | Freq: Four times a day (QID) | ORAL | Status: DC | PRN
  Administered 2017-03-03: 50 mg via ORAL
  Filled 2017-03-03: qty 1

## 2017-03-03 MED ORDER — CEFUROXIME AXETIL 500 MG PO TABS
500.0000 mg | ORAL_TABLET | Freq: Two times a day (BID) | ORAL | Status: DC
  Administered 2017-03-03 – 2017-03-04 (×3): 500 mg via ORAL
  Filled 2017-03-03 (×4): qty 1

## 2017-03-03 MED ORDER — AZITHROMYCIN 250 MG PO TABS: 250.0000 mg | ORAL_TABLET | Freq: Every day | ORAL | 0 refills | Status: DC

## 2017-03-03 MED ORDER — CEFUROXIME AXETIL 500 MG PO TABS: 500.0000 mg | ORAL_TABLET | Freq: Two times a day (BID) | ORAL | 0 refills | Status: DC

## 2017-03-03 MED ORDER — GERHARDT'S BUTT CREAM
TOPICAL_CREAM | Freq: Two times a day (BID) | CUTANEOUS | Status: DC
  Administered 2017-03-03 – 2017-03-04 (×3): via TOPICAL
  Filled 2017-03-03: qty 1

## 2017-03-03 NOTE — Progress Notes (Signed)
Notified [facility] and [facility] that dc has been cancelled tonight

## 2017-03-03 NOTE — Progress Notes (Signed)
[place] [facility] [place] NAME: Collin Gonzales    MR#:  [id number]  DATE OF BIRTH:  1935-10-12  SUBJECTIVE:  Patient came in with increasing shortness of breath.  He had some productive cough.  Found to have pneumonia. Denies any complaints today  REVIEW OF SYSTEMS:   Review of Systems  Constitutional: Negative for chills, fever and weight loss.  HENT: Negative for ear discharge, ear pain and nosebleeds.   Eyes: Negative for blurred vision, pain and discharge.  Respiratory: Positive for cough, sputum production and shortness of breath. Negative for wheezing and stridor.   Cardiovascular: Negative for chest pain, palpitations, orthopnea and PND.  Gastrointestinal: Negative for abdominal pain, diarrhea, nausea and vomiting.  Genitourinary: Negative for frequency and urgency.  Musculoskeletal: Negative for back pain and joint pain.  Neurological: Positive for weakness. Negative for sensory change, speech change and focal weakness.  Psychiatric/Behavioral: Negative for depression and hallucinations. The patient is not nervous/anxious.    Tolerating Diet:yes Tolerating PT: REHAB  DRUG ALLERGIES:   Allergies  Allergen Reactions  . Plaquenil [Hydroxychloroquine]   . Simvastatin Other (See Comments)    Bad dreams    VITALS:  Blood pressure (!) 155/83, pulse 85, temperature 98.4 F (36.9 C), temperature source Oral, resp. rate (!) 22, height 5\' 8"  (1.727 m), weight 72.3 kg (159 lb 8 oz), SpO2 97 %.  PHYSICAL EXAMINATION:   Physical Exam  GENERAL:  82 y.o.-year-old patient lying in the bed with no acute distress.  EYES: Pupils equal, round, reactive to light and accommodation. No scleral icterus. Extraocular muscles intact.  HEENT: Head atraumatic, normocephalic. Oropharynx and nasopharynx clear.  NECK:  Supple, no jugular venous distention. No thyroid enlargement, no tenderness.  LUNGS:  Coarse breath sounds bilaterally, no  wheezing, rales, rhonchi. No use of accessory muscles of respiration.  CARDIOVASCULAR: S1, S2 normal. No murmurs, rubs, or gallops.  ABDOMEN: Soft, nontender, nondistended. Bowel sounds present. No organomegaly or mass.  EXTREMITIES: No cyanosis, clubbing or edema b/l.    NEUROLOGIC: Cranial nerves II through XII are intact. No focal Motor or sensory deficits b/l.  weak PSYCHIATRIC:  patient is alert and oriented x 3.  SKIN: Moisture associated skin damage to scrotum and perineal area.  Present on admission generalized partial thickness skin loss to scrotum    LABORATORY PANEL:  CBC Recent Labs  Lab 03/02/17 0406  WBC 3.7*  HGB 10.8*  HCT 33.7*  PLT 81*    Chemistries  Recent Labs  Lab 03/01/17 1906 03/02/17 0406  NA 136 137  K 3.6 3.7  CL 100* 102  CO2 27 27  GLUCOSE 108* 106*  BUN 22* 19  CREATININE 1.25* 1.03  CALCIUM 8.5* 8.3*  AST 29  --   ALT 11*  --   ALKPHOS 83  --   BILITOT 0.6  --    Cardiac Enzymes Recent Labs  Lab 03/01/17 1906  TROPONINI <0.03   RADIOLOGY:  Dg Chest Port 1 View  Result Date: 03/01/2017 CLINICAL DATA:  Generalized weakness. Cough and runny nose. On home oxygen. EXAM: PORTABLE CHEST 1 VIEW COMPARISON:  CT 07/25/2016.  Radiographs 04/08/2016. FINDINGS: 1838 hr. Two views obtained. The heart size and mediastinal contours are stable. There is severe emphysema with stable asymmetric right apical scarring. There is increased density in the retrocardiac left lower lobe which could reflect pneumonia. No evidence of pneumothorax or significant pleural effusion. The bones appear unchanged. IMPRESSION: Increased retrocardiac left lower lobe  opacity compared with prior studies, suspicious for pneumonia, possibly on the basis of aspiration. Severe emphysema. Electronically Signed   By: Richardean Sale M.D.   On: 03/01/2017 19:08   ASSESSMENT AND PLAN:   Collin Gonzales  is a 82 y.o. male with a known history of COPD, RA on Methotraxate, Lung cancer-  s/p radiation, Stroke- was suddenly very weak this morning when woke up. Also have some cough and sputum production. Denies any fever.  *1.Community-acquired pneumonia and UTI -Continue IV Rocephin and Zithromax---changed to oral Levaquin -Blood cultures negative -Influenza negative -Patient afebrile  2.  COPD acute on chronic mild exacerbation -Continue nebulizer, inhaler - patient uses chronic home oxygen 2 L /min  3.  Rheumatoid arthritis -Continue methotrexate  4.Acute renal failure Resolved after IV hydration  Patient is overall improved.  Ready for discharge.  Await insurance authorization [facility]  Case discussed with Care Management/Social Worker.  Tried to call patient's sister on the phone--- did not have a voicemail.  CODE STATUS: full  DVT Prophylaxis: lovenox  TOTAL TIME TAKING CARE OF THIS PATIENT: 25 minutes.  >50% time spent on counselling and coordination of care  POSSIBLE D/C IN 1-2DAYS, DEPENDING ON CLINICAL CONDITION.  Note: This dictation was prepared with Dragon dictation along with smaller phrase technology. Any transcriptional errors that result from this process are unintentional.  Fritzi Mandes M.D on 03/03/2017 at 10:39 AM  Between 7am to 6pm - Pager - [phone]  After 6pm go to [email or web address] - password [email or web address] [place]  Office  [phone]  CC: Primary care physician; Roselee Nova, MDPatient ID: Collin Gonzales, male   DOB: 08-Apr-1935, 82 y.o.   MRN: [id number]

## 2017-03-03 NOTE — Clinical Social Work Placement (Signed)
   CLINICAL SOCIAL WORK PLACEMENT  NOTE  Date:  03/03/2017  Patient Details  Name: Collin Gonzales MRN: [id number] Date of Birth: 11-14-1935  Clinical Social Work is seeking post-discharge placement for this patient at the [place] level of care (*CSW will initial, date and re-position this form in  chart as items are completed):  Yes   Patient/family provided with [place] Work Department's list of facilities offering this level of care within the geographic area requested by the patient (or if unable, by the patient's family).  Yes   Patient/family informed of their freedom to choose among providers that offer the needed level of care, that participate in Medicare, Medicaid or managed care program needed by the patient, have an available bed and are willing to accept the patient.  Yes   Patient/family informed of [facility] ownership interest in [facility] and [facility], as well as of the fact that they are under no obligation to receive care at these facilities.  PASRR submitted to EDS on 03/02/17     PASRR number received on 03/02/17     Existing PASRR number confirmed on       FL2 transmitted to all facilities in geographic area requested by pt/family on 03/02/17     FL2 transmitted to all facilities within larger geographic area on       Patient informed that his/her managed care company has contracts with or will negotiate with certain facilities, including the following:        Yes   Patient/family informed of bed offers received.  Patient chooses bed at ([facility] )     Physician recommends and patient chooses bed at      Patient to be transferred to ([facility] ) on 03/03/17.  Patient to be transferred to facility by [facility] )     Patient family notified on 03/03/17 of transfer.  Name of family member notified:  (Patient's sister Sydell Axon is aware of D/C today. )     PHYSICIAN       Additional Comment:     _______________________________________________ Marnae Madani, Veronia Beets, LCSW 03/03/2017, 3:52 PM

## 2017-03-03 NOTE — Progress Notes (Signed)
Nurse called me, pt was on 2 ltr oxygen in afternoon and switched to oral Abx for d/c to [facility]. But now he is more hypoxic and requires 4 ltr oxygen. ( Sats on 2 ltr were 84%)  I will cancel d/c , get a repeat 2 view Xray chest and CBC tomorrow.

## 2017-03-03 NOTE — Progress Notes (Signed)
Paged dr Vanetta Shawl. Pt has low grade temp of 100.1 and pulse ox is 84% on 2L.  Increased to 4L for o2 sat of 90%. Cancel dc tonight

## 2017-03-03 NOTE — Consult Note (Signed)
[facility] Nurse wound consult note Reason for Consult: Moisture associated skin damage to scrotum and perineal area.  Present on admission Wound type:MASD from incontinence Pressure Injury POA: NA Measurement: generalized partial thickness skin loss to scrotum from exposure to incontinence Wound XTK:WIOX and moist Drainage (amount, consistency, odor) scant weeping Periwound:itnact Dressing procedure/placement/frequency:Cleanse perineal skin twice daily and PRn incontinence.  Apply moisture barrier cream.  Avoid any disposable briefs or underpads.  Dermatherapy linen only to wick moisture Will not follow at this time.  Please re-consult if needed.  Domenic Moras RN BSN [place] Pager [phone]

## 2017-03-03 NOTE — Progress Notes (Signed)
Patient is medically stable for D/C to [facility] today. Per [facility] authorization has been received and patient can come today to room 807. RN will call report to RN Yaakov Guthrie at [phone] and arrange EMS for transport. Clinical Education officer, museum (CSW) sent D/C orders to [facility]. Patient is aware of above. CSW contacted patient's sister Sydell Axon and made her aware of above. Please reconsult if future social work needs arise. CSW signing off.   [employer], LCSW [phone]

## 2017-03-03 NOTE — Progress Notes (Signed)
Report called to [place] at [employer]

## 2017-03-03 NOTE — Discharge Summary (Addendum)
[facility] NAME: Collin Gonzales    MR#:  [id number]  DATE OF BIRTH:  Apr 04, 1935  DATE OF ADMISSION:  03/01/2017 ADMITTING PHYSICIAN: Collin Basta, MD  DATE OF DISCHARGE: 03/03/17  PRIMARY CARE PHYSICIAN: Collin Nova, MD    ADMISSION DIAGNOSIS:  Hypoxia [R09.02] HCAP (healthcare-associated pneumonia) [J18.9] Sepsis, due to unspecified organism ([place]) [A41.9]  DISCHARGE DIAGNOSIS:  Sepsis due to pneumonia COPD acute on chronic exacerbation--- patient on chronic home oxygen Renal failure resolved Generalized weakness  SECONDARY DIAGNOSIS:   Past Medical History:  Diagnosis Date  . Arthritis    rheumatoid arthritis  . Cancer ([place])   . Collagen vascular disease ([place])   . COPD (chronic obstructive pulmonary disease) ([place])   . GERD (gastroesophageal reflux disease)   . History of prostate cancer   . HOH (hard of hearing)   . Insomnia   . Lung cancer ([place])   . Oxygen deficit    2L HS AND PRN  . Stroke ([place])    2000  . Wheezing     HOSPITAL COURSE:   Collin Gonzales a88 y.o.malewith a known history of COPD, RA on Methotraxate, Lung cancer- s/p radiation, Stroke- was suddenly very weak this morning when woke up. Also have some cough and sputum production. Denies any fever.  1.Community-acquired pneumonia - pt. Was treated with IV Rocephin, Zithromax and has improved and now being discharged on Oral Ceftin, Zithromax.  - cultures are (-) and pt. Remains afebrile.   2. UTI - urine cultures grew <10,000. Empirically pt. Was treated with IV Ceftriaxone and now discharged on oral Ceftin.   2.  COPD acute on chronic mild exacerbation - improved now and pt. Will cont. His Advair, Spiriva, albuterol inhaler as needed.  - his O2 requirements went up yesterday but back down to 2 L now.  His CXR this a.m. Showing pneumonia with COPD but no other acute process.  - tolerated O2 sats 90% or higher.     3.  Rheumatoid arthritis - he will Continue methotrexate  4.Acute renal failure - this has improved and resolved with IV fluid hydration.   5.  Generalized weakness/deconditioning with a #1 -Patient will discharge to rehab today.    CONSULTS OBTAINED:    DRUG ALLERGIES:   Allergies  Allergen Reactions  . Plaquenil [Hydroxychloroquine]   . Simvastatin Other (See Comments)    Bad dreams    DISCHARGE MEDICATIONS:   Allergies as of 03/03/2017      Reactions   Plaquenil [hydroxychloroquine]    Simvastatin Other (See Comments)   Bad dreams      Medication List    TAKE these medications   ADVAIR DISKUS 250-50 MCG/DOSE Aepb Generic drug:  Fluticasone-Salmeterol Inhale 1 puff into the lungs every morning.   albuterol 108 (90 Base) MCG/ACT inhaler Commonly known as:  PROAIR HFA Inhale 2 puffs into the lungs every 6 (six) hours as needed for wheezing.   aspirin EC 81 MG tablet Take 81 mg by mouth daily.   atorvastatin 10 MG tablet Commonly known as:  LIPITOR TAKE 1 TABLET (10 MG TOTAL) BY MOUTH DAILY AT 6 PM.   azithromycin 250 MG tablet Commonly known as:  ZITHROMAX Take 1 tablet (250 mg total) by mouth daily. Start taking on:  03/04/2017   B-complex with vitamin C tablet Take 1 tablet by mouth daily.   cefUROXime 500 MG tablet Commonly known as:  CEFTIN Take 1 tablet (500 mg  total) by mouth 2 (two) times daily with a meal.   folic acid 1 MG tablet Commonly known as:  FOLVITE Take 1 tablet (1 mg total) by mouth daily.   methotrexate 2.5 MG tablet Take 20 mg by mouth every Tuesday. With Supper   mirtazapine 15 MG tablet Commonly known as:  REMERON Take 1 tablet (15 mg total) by mouth at bedtime.   nystatin powder Commonly known as:  MYCOSTATIN/NYSTOP Apply topically 3 (three) times daily.   omeprazole 40 MG capsule Commonly known as:  PRILOSEC Take 1 capsule (40 mg total) by mouth daily.   SPIRIVA HANDIHALER 18 MCG inhalation capsule Generic  drug:  tiotropium Place into inhaler and inhale 2 (two) times daily.   tamsulosin 0.4 MG Caps capsule Commonly known as:  FLOMAX Take 1 capsule (0.4 mg total) by mouth daily.   traZODone 100 MG tablet Commonly known as:  DESYREL Take 1 tablet (100 mg total) by mouth at bedtime.       If you experience worsening of your admission symptoms, develop shortness of breath, life threatening emergency, suicidal or homicidal thoughts you must seek medical attention immediately by calling 911 or calling your MD immediately  if symptoms less severe.  You Must read complete instructions/literature along with all the possible adverse reactions/side effects for all the Medicines you take and that have been prescribed to you. Take any new Medicines after you have completely understood and accept all the possible adverse reactions/side effects.   Please note  You were cared for by a hospitalist during your hospital stay. If you have any questions about your discharge medications or the care you received while you were in the hospital after you are discharged, you can call the unit and asked to speak with the hospitalist on call if the hospitalist that took care of you is not available. Once you are discharged, your primary care physician will handle any further medical issues. Please note that NO REFILLS for any discharge medications will be authorized once you are discharged, as it is imperative that you return to your primary care physician (or establish a relationship with a primary care physician if you do not have one) for your aftercare needs so that they can reassess your need for medications and monitor your lab values.  Subjective   Pt's O2 requirements went up yesterday and his discharge was held.  Feels fine and sob improved. CXR this a.m. Showing pneumonia with COPD but no further changes. Afebrile.  Will d/c back to SNF today.   Physical Exam.   GENERAL:  82 y.o.-year-old patient lying in  the bed with no acute distress.  EYES: Pupils equal, round, reactive to light and accommodation. No scleral icterus. Extraocular muscles intact.  HEENT: Head atraumatic, normocephalic. Oropharynx and nasopharynx clear.  NECK:  Supple, no jugular venous distention. No thyroid enlargement, no tenderness.  LUNGS:  Coarse breath sounds bilaterally, no wheezing, rales, rhonchi. No use of accessory muscles of respiration.  CARDIOVASCULAR: S1, S2 normal. No murmurs, rubs, or gallops.  ABDOMEN: Soft, nontender, nondistended. Bowel sounds present. No organomegaly or mass.  EXTREMITIES: No cyanosis, clubbing or edema b/l.    NEUROLOGIC: Cranial nerves II through XII are intact. No focal Motor or sensory deficits b/l. Globally weak PSYCHIATRIC:  patient is alert and oriented x 3.  SKIN: No rashes, ulcers noted.     DATA REVIEW:   CBC  Recent Labs  Lab 03/02/17 0406  WBC 3.7*  HGB 10.8*  HCT 33.7*  PLT 81*    Chemistries  Recent Labs  Lab 03/01/17 1906 03/02/17 0406  NA 136 137  K 3.6 3.7  CL 100* 102  CO2 27 27  GLUCOSE 108* 106*  BUN 22* 19  CREATININE 1.25* 1.03  CALCIUM 8.5* 8.3*  AST 29  --   ALT 11*  --   ALKPHOS 83  --   BILITOT 0.6  --     Microbiology Results   Recent Results (from the past 240 hour(s))  Blood Culture (routine x 2)     Status: None (Preliminary result)   Collection Time: 03/01/17  7:07 PM  Result Value Ref Range Status   Specimen Description BLOOD LAC  Final   Special Requests   Final    BOTTLES DRAWN AEROBIC AND ANAEROBIC Blood Culture adequate volume   Culture   Final    NO GROWTH 2 DAYS Performed at [facility], [address] [place], [place] [address]    Report Status PENDING  Incomplete  Blood Culture (routine x 2)     Status: None (Preliminary result)   Collection Time: 03/01/17  7:09 PM  Result Value Ref Range Status   Specimen Description BLOOD RAC  Final   Special Requests   Final    BOTTLES DRAWN AEROBIC AND  ANAEROBIC Blood Culture adequate volume   Culture   Final    NO GROWTH 2 DAYS Performed at [facility], [address], [place], [place] [address]    Report Status PENDING  Incomplete  Urine culture     Status: Abnormal   Collection Time: 03/01/17 10:43 PM  Result Value Ref Range Status   Specimen Description   Final    URINE, RANDOM Performed at [facility], [address], [place], [place] [address]    Special Requests   Final    NONE Performed at [facility], [address] [place], [place] [address]    Culture (A)  Final    <10,000 COLONIES/mL INSIGNIFICANT GROWTH Performed at [facility], [place] [address], [place], [place] [address]    Report Status 03/03/2017 FINAL  Final  MRSA PCR Screening     Status: None   Collection Time: 03/02/17 12:02 AM  Result Value Ref Range Status   MRSA by PCR NEGATIVE NEGATIVE Final    Comment:        The GeneXpert MRSA Assay (FDA approved for NASAL specimens only), is one component of a comprehensive MRSA colonization surveillance program. It is not intended to diagnose MRSA infection nor to guide or monitor treatment for MRSA infections. Performed at [facility], [address] [place], [place] [address]     RADIOLOGY:  Dg Chest Port 1 View  Result Date: 03/01/2017 CLINICAL DATA:  Generalized weakness. Cough and runny nose. On home oxygen. EXAM: PORTABLE CHEST 1 VIEW COMPARISON:  CT 07/25/2016.  Radiographs 04/08/2016. FINDINGS: 1838 hr. Two views obtained. The heart size and mediastinal contours are stable. There is severe emphysema with stable asymmetric right apical scarring. There is increased density in the retrocardiac left lower lobe which could reflect pneumonia. No evidence of pneumothorax or significant pleural effusion. The bones appear unchanged. IMPRESSION: Increased retrocardiac left lower lobe opacity compared with prior studies, suspicious for pneumonia, possibly on the  basis of aspiration. Severe emphysema. Electronically Signed   By: Richardean Sale M.D.   On: 03/01/2017 19:08     Management plans discussed with the patient, family and they are in  agreement.  CODE STATUS:     Code Status Orders  (From admission, onward)        Start     Ordered   03/01/17 2317  Do not attempt resuscitation (DNR)  Continuous    Question Answer Comment  In the event of cardiac or respiratory ARREST Do not call a "code blue"   In the event of cardiac or respiratory ARREST Do not perform Intubation, CPR, defibrillation or ACLS   In the event of cardiac or respiratory ARREST Use medication by any route, position, wound care, and other measures to relive pain and suffering. May use oxygen, suction and manual treatment of airway obstruction as needed for comfort.   Comments nurse may pronounce      03/01/17 2316    Code Status History    Date Active Date Inactive Code Status Order ID Comments User Context   08/18/2015 13:12 08/20/2015 14:37 DNR [id number]  Loletha Grayer, MD ED      TOTAL TIME TAKING CARE OF THIS PATIENT: 40 minutes.    Henreitta Leber M.D on 03/04/2017 at 10:27 AM  Between 7am to 6pm - Pager - [phone] After 6pm go to [email or web address] - password [email or web address] [facility]  Office  [phone]  CC: Primary care physician; Collin Nova, MD

## 2017-03-04 ENCOUNTER — Inpatient Hospital Stay: Payer: Medicare HMO

## 2017-03-04 DIAGNOSIS — N189 Chronic kidney disease, unspecified: Secondary | ICD-10-CM | POA: Diagnosis not present

## 2017-03-04 DIAGNOSIS — R531 Weakness: Secondary | ICD-10-CM | POA: Diagnosis not present

## 2017-03-04 DIAGNOSIS — D61818 Other pancytopenia: Secondary | ICD-10-CM | POA: Diagnosis not present

## 2017-03-04 DIAGNOSIS — N390 Urinary tract infection, site not specified: Secondary | ICD-10-CM | POA: Diagnosis not present

## 2017-03-04 DIAGNOSIS — R05 Cough: Secondary | ICD-10-CM | POA: Diagnosis not present

## 2017-03-04 DIAGNOSIS — Z5189 Encounter for other specified aftercare: Secondary | ICD-10-CM | POA: Diagnosis not present

## 2017-03-04 DIAGNOSIS — D649 Anemia, unspecified: Secondary | ICD-10-CM | POA: Diagnosis not present

## 2017-03-04 DIAGNOSIS — M6281 Muscle weakness (generalized): Secondary | ICD-10-CM | POA: Diagnosis not present

## 2017-03-04 DIAGNOSIS — M069 Rheumatoid arthritis, unspecified: Secondary | ICD-10-CM | POA: Diagnosis not present

## 2017-03-04 DIAGNOSIS — Z7401 Bed confinement status: Secondary | ICD-10-CM | POA: Diagnosis not present

## 2017-03-04 DIAGNOSIS — Y95 Nosocomial condition: Secondary | ICD-10-CM | POA: Diagnosis not present

## 2017-03-04 DIAGNOSIS — Z23 Encounter for immunization: Secondary | ICD-10-CM | POA: Diagnosis not present

## 2017-03-04 DIAGNOSIS — Z8546 Personal history of malignant neoplasm of prostate: Secondary | ICD-10-CM | POA: Diagnosis not present

## 2017-03-04 DIAGNOSIS — J449 Chronic obstructive pulmonary disease, unspecified: Secondary | ICD-10-CM | POA: Diagnosis not present

## 2017-03-04 DIAGNOSIS — M0579 Rheumatoid arthritis with rheumatoid factor of multiple sites without organ or systems involvement: Secondary | ICD-10-CM | POA: Diagnosis not present

## 2017-03-04 DIAGNOSIS — A419 Sepsis, unspecified organism: Secondary | ICD-10-CM | POA: Diagnosis not present

## 2017-03-04 DIAGNOSIS — J189 Pneumonia, unspecified organism: Secondary | ICD-10-CM | POA: Diagnosis not present

## 2017-03-04 DIAGNOSIS — R1312 Dysphagia, oropharyngeal phase: Secondary | ICD-10-CM | POA: Diagnosis not present

## 2017-03-04 DIAGNOSIS — K219 Gastro-esophageal reflux disease without esophagitis: Secondary | ICD-10-CM | POA: Diagnosis not present

## 2017-03-04 DIAGNOSIS — G4700 Insomnia, unspecified: Secondary | ICD-10-CM | POA: Diagnosis not present

## 2017-03-04 DIAGNOSIS — E785 Hyperlipidemia, unspecified: Secondary | ICD-10-CM | POA: Diagnosis not present

## 2017-03-04 LAB — CBC
HEMATOCRIT: 28.7 % — AB (ref 40.0–52.0)
HEMOGLOBIN: 9.3 g/dL — AB (ref 13.0–18.0)
MCH: 30.3 pg (ref 26.0–34.0)
MCHC: 32.4 g/dL (ref 32.0–36.0)
MCV: 93.7 fL (ref 80.0–100.0)
Platelets: 75 10*3/uL — ABNORMAL LOW (ref 150–440)
RBC: 3.07 MIL/uL — ABNORMAL LOW (ref 4.40–5.90)
RDW: 16.4 % — ABNORMAL HIGH (ref 11.5–14.5)
WBC: 4.2 10*3/uL (ref 3.8–10.6)

## 2017-03-04 NOTE — Clinical Social Work Note (Signed)
The patient has been cleared for discharge to [facility] today via non-emergent EMS. The patient and the facility are aware and in agreement. CSW has updated the discharge packet. CSW is signing off. Please consult should needs arise.  Santiago Bumpers, MSW, [place] Presser [phone]

## 2017-03-04 NOTE — Progress Notes (Signed)
Pt D/C by EMS to [facility]. Report given to RN Ann. IVs removed in tact. DNR paperwork sent with pt. Family updated of move. VSS

## 2017-03-06 LAB — CULTURE, BLOOD (ROUTINE X 2)
CULTURE: NO GROWTH
Culture: NO GROWTH
SPECIAL REQUESTS: ADEQUATE
SPECIAL REQUESTS: ADEQUATE

## 2017-03-07 DIAGNOSIS — N390 Urinary tract infection, site not specified: Secondary | ICD-10-CM | POA: Diagnosis not present

## 2017-03-07 DIAGNOSIS — Z8546 Personal history of malignant neoplasm of prostate: Secondary | ICD-10-CM | POA: Diagnosis not present

## 2017-03-07 DIAGNOSIS — M0579 Rheumatoid arthritis with rheumatoid factor of multiple sites without organ or systems involvement: Secondary | ICD-10-CM | POA: Diagnosis not present

## 2017-03-07 DIAGNOSIS — J449 Chronic obstructive pulmonary disease, unspecified: Secondary | ICD-10-CM | POA: Diagnosis not present

## 2017-03-07 DIAGNOSIS — G4700 Insomnia, unspecified: Secondary | ICD-10-CM | POA: Diagnosis not present

## 2017-03-07 DIAGNOSIS — K219 Gastro-esophageal reflux disease without esophagitis: Secondary | ICD-10-CM | POA: Diagnosis not present

## 2017-03-07 DIAGNOSIS — E785 Hyperlipidemia, unspecified: Secondary | ICD-10-CM | POA: Diagnosis not present

## 2017-03-07 DIAGNOSIS — J189 Pneumonia, unspecified organism: Secondary | ICD-10-CM | POA: Diagnosis not present

## 2017-03-15 DIAGNOSIS — N390 Urinary tract infection, site not specified: Secondary | ICD-10-CM | POA: Diagnosis not present

## 2017-03-15 DIAGNOSIS — Z8546 Personal history of malignant neoplasm of prostate: Secondary | ICD-10-CM | POA: Diagnosis not present

## 2017-03-15 DIAGNOSIS — K219 Gastro-esophageal reflux disease without esophagitis: Secondary | ICD-10-CM | POA: Diagnosis not present

## 2017-03-15 DIAGNOSIS — D61818 Other pancytopenia: Secondary | ICD-10-CM | POA: Diagnosis not present

## 2017-03-15 DIAGNOSIS — J449 Chronic obstructive pulmonary disease, unspecified: Secondary | ICD-10-CM | POA: Diagnosis not present

## 2017-03-15 DIAGNOSIS — E785 Hyperlipidemia, unspecified: Secondary | ICD-10-CM | POA: Diagnosis not present

## 2017-03-15 DIAGNOSIS — G4700 Insomnia, unspecified: Secondary | ICD-10-CM | POA: Diagnosis not present

## 2017-03-15 DIAGNOSIS — J189 Pneumonia, unspecified organism: Secondary | ICD-10-CM | POA: Diagnosis not present

## 2017-03-15 DIAGNOSIS — M0579 Rheumatoid arthritis with rheumatoid factor of multiple sites without organ or systems involvement: Secondary | ICD-10-CM | POA: Diagnosis not present

## 2017-03-21 ENCOUNTER — Encounter: Payer: Self-pay | Admitting: Family Medicine

## 2017-03-21 ENCOUNTER — Ambulatory Visit (INDEPENDENT_AMBULATORY_CARE_PROVIDER_SITE_OTHER): Payer: Medicare HMO | Admitting: Family Medicine

## 2017-03-21 ENCOUNTER — Ambulatory Visit (INDEPENDENT_AMBULATORY_CARE_PROVIDER_SITE_OTHER): Payer: Medicare HMO

## 2017-03-21 VITALS — BP 133/66 | HR 85 | Ht 68.0 in | Wt 124.0 lb

## 2017-03-21 VITALS — BP 112/58 | HR 70 | Temp 97.8°F | Wt 125.9 lb

## 2017-03-21 DIAGNOSIS — R339 Retention of urine, unspecified: Secondary | ICD-10-CM | POA: Diagnosis not present

## 2017-03-21 DIAGNOSIS — R300 Dysuria: Secondary | ICD-10-CM | POA: Diagnosis not present

## 2017-03-21 LAB — POCT URINALYSIS DIPSTICK
Bilirubin, UA: NEGATIVE
GLUCOSE UA: NEGATIVE
KETONES UA: NEGATIVE
NITRITE UA: NEGATIVE
RBC UA: NEGATIVE
Urobilinogen, UA: 0.2 E.U./dL
pH, UA: >=9 — AB (ref 5.0–8.0)

## 2017-03-21 NOTE — Progress Notes (Signed)
Name: Collin Gonzales   MRN: [id number]    DOB: [birth year]   Date:03/21/2017       Progress Note  Subjective  Chief Complaint  Chief Complaint  Patient presents with  . Hospitalization Follow-up    Hosp for pneumoina but pt is having Pain in stomach; burning when he is able to use the bathroom, pt reports onset today and he couldn't use the bathroom but when [facility] was [facility] for couple of weeks he was able to use the restroom.     HPI  Pt. Presents for follow up from hospital where he was admitted for symptoms of cough, sputum production and weakness and was diagnosed with community acquired pneumonia. He was admiited for IV antibiotics, he recovered and was discharged.  He reports that he has difficulty urinating since this AM, he was able to urinate fine yesterday (went about 5 or 6 times ), when he woke up this AM, he went to bathroom and felt that he had to urinate but nothing came out, he has history of prostate cancer, recurrent UTI and urinary retention.  He denies any fevers chills, has some pain in the lower abdominal area.  He denies blood in urine or constipation.    Past Medical History:  Diagnosis Date  . Arthritis    rheumatoid arthritis  . Cancer ([place])   . Collagen vascular disease ([place])   . COPD (chronic obstructive pulmonary disease) ([place])   . GERD (gastroesophageal reflux disease)   . History of prostate cancer   . HOH (hard of hearing)   . Insomnia   . Lung cancer ([place])   . Oxygen deficit    2L HS AND PRN  . Stroke ([place])    2000  . Wheezing     Past Surgical History:  Procedure Laterality Date  . APPENDECTOMY  1965  . CATARACT EXTRACTION W/PHACO Right 01/05/2016   Procedure: CATARACT EXTRACTION PHACO AND INTRAOCULAR LENS PLACEMENT (IOC);  Surgeon: [name], MD;  Location: [facility] ORS;  Service: Ophthalmology;  Laterality: Right;  [place] 1.14 AP% 18.1 CDE 13.44 FLUID PACK LOT # [id number] H  . ENDOBRONCHIAL ULTRASOUND N/A 02/26/2015   Procedure:  ENDOBRONCHIAL ULTRASOUND;  Surgeon: Flora Lipps, MD;  Location: [facility] ORS;  Service: Cardiopulmonary;  Laterality: N/A;  . HERNIA REPAIR  1965    Family History  Problem Relation Age of Onset  . Cancer Mother   . Cancer Father        Lung Cancer  . Cancer Sister   . Cancer Brother     Social History   Socioeconomic History  . Marital status: Single    Spouse name: Not on file  . Number of children: 5  . Years of education: Not on file  . Highest education level: Not on file  Social Needs  . Financial resource strain: Not very hard  . Food insecurity - worry: Never true  . Food insecurity - inability: Never true  . Transportation needs - medical: No  . Transportation needs - non-medical: No  Occupational History  . Occupation: Retired  Tobacco Use  . Smoking status: Former Smoker    Last attempt to quit: 02/21/2013    Years since quitting: 4.0  . Smokeless tobacco: Never Used  Substance and Sexual Activity  . Alcohol use: No    Alcohol/week: 0.0 oz  . Drug use: No  . Sexual activity: No  Other Topics Concern  . Not on file  Social History Narrative  . Not on  file     Current Outpatient Medications:  .  albuterol (PROAIR HFA) 108 (90 Base) MCG/ACT inhaler, Inhale 2 puffs into the lungs every 6 (six) hours as needed for wheezing., Disp: 3 Inhaler, Rfl: 2 .  aspirin EC 81 MG tablet, Take 81 mg by mouth daily., Disp: , Rfl:  .  atorvastatin (LIPITOR) 10 MG tablet, TAKE 1 TABLET (10 MG TOTAL) BY MOUTH DAILY AT 6 PM., Disp: 90 tablet, Rfl: 0 .  azithromycin (ZITHROMAX) 250 MG tablet, Take 1 tablet (250 mg total) by mouth daily., Disp: 4 tablet, Rfl: 0 .  B Complex-C (B-COMPLEX WITH VITAMIN C) tablet, Take 1 tablet by mouth daily., Disp: , Rfl:  .  cefUROXime (CEFTIN) 500 MG tablet, Take 1 tablet (500 mg total) by mouth 2 (two) times daily with a meal., Disp: 8 tablet, Rfl: 0 .  Fluticasone-Salmeterol (ADVAIR DISKUS) 250-50 MCG/DOSE AEPB, Inhale 1 puff into the lungs every  morning. , Disp: , Rfl:  .  folic acid (FOLVITE) 1 MG tablet, Take 1 tablet (1 mg total) by mouth daily., Disp: 30 tablet, Rfl: 5 .  methotrexate 2.5 MG tablet, Take 20 mg by mouth every Tuesday. With Supper, Disp: , Rfl:  .  mirtazapine (REMERON) 15 MG tablet, Take 1 tablet (15 mg total) by mouth at bedtime., Disp: 90 tablet, Rfl: 0 .  nystatin (MYCOSTATIN/NYSTOP) powder, Apply topically 3 (three) times daily., Disp: 15 g, Rfl: 0 .  omeprazole (PRILOSEC) 40 MG capsule, Take 1 capsule (40 mg total) by mouth daily., Disp: 90 capsule, Rfl: 1 .  tamsulosin (FLOMAX) 0.4 MG CAPS capsule, Take 1 capsule (0.4 mg total) by mouth daily., Disp: 30 capsule, Rfl: 11 .  tiotropium (SPIRIVA HANDIHALER) 18 MCG inhalation capsule, Place into inhaler and inhale 2 (two) times daily. , Disp: , Rfl:  .  traZODone (DESYREL) 100 MG tablet, Take 1 tablet (100 mg total) by mouth at bedtime., Disp: 90 tablet, Rfl: 0  Allergies  Allergen Reactions  . Plaquenil [Hydroxychloroquine]   . Simvastatin Other (See Comments)    Bad dreams     ROS  See HPI for complete discussion of ROS  Objective  Vitals:   03/21/17 1352  BP: (!) 112/58  Pulse: 70  Temp: 97.8 F (36.6 C)  TempSrc: Oral  SpO2: 97%  Weight: 125 lb 14.4 oz (57.1 kg)    Physical Exam  Constitutional: He is well-developed, well-nourished, and in no distress.  Cardiovascular: Normal rate and regular rhythm.  Abdominal: Soft. Bowel sounds are normal. There is no tenderness.  Nursing note and vitals reviewed.       Assessment & Plan  1. Burning with urination Urinalysis shows leukocytes but negative for nitrites, no blood, unlikely to be an infection, - POCT urinalysis dipstick  2. Urinary retention Spoke with the nurse for Dr. Pilar Jarvis and explained patient's symptoms of urinary retention, history of prostate cancer, the need for Foley catheterization.  She asked me to patient to their office right away and the nurse would likely do a  Foley catheterization and patient will follow up with Dr. Pilar Jarvis. Explained plan to the patient who is in agreement  [employer] [facility] 03/21/2017 2:12 PM

## 2017-03-21 NOTE — Progress Notes (Signed)
Bladder Scan:550 Patient cannot void:  Performed By: Toniann Fail, LPN   Simple Catheter Placement  Due to urinary retention patient is present today for a foley cath placement.  Patient was cleaned and prepped in a sterile fashion with betadine and lidocaine jelly 2% was instilled into the urethra.  A 16 FR foley catheter was inserted, urine return was noted  569ml, urine was yellow in color.  The balloon was filled with 10cc of sterile water.  A leg bag was attached for drainage. Patient was also given a night bag to take home and was given instruction on how to change from one bag to another.  Patient was given instruction on proper catheter care.  Patient tolerated well, no complications were noted   Preformed by: Toniann Fail, LPN   Additional notes/ Follow up: Pt made a f/u appt at check out with Dr. Pilar Jarvis.  Blood pressure 133/66, pulse 85, height 5\' 8"  (1.727 m), weight 124 lb (56.2 kg).

## 2017-03-22 ENCOUNTER — Other Ambulatory Visit: Payer: Self-pay

## 2017-03-22 DIAGNOSIS — M059 Rheumatoid arthritis with rheumatoid factor, unspecified: Secondary | ICD-10-CM | POA: Diagnosis not present

## 2017-03-22 DIAGNOSIS — J189 Pneumonia, unspecified organism: Secondary | ICD-10-CM | POA: Diagnosis not present

## 2017-03-22 DIAGNOSIS — E785 Hyperlipidemia, unspecified: Secondary | ICD-10-CM | POA: Diagnosis not present

## 2017-03-22 DIAGNOSIS — N390 Urinary tract infection, site not specified: Secondary | ICD-10-CM | POA: Diagnosis not present

## 2017-03-22 DIAGNOSIS — J449 Chronic obstructive pulmonary disease, unspecified: Secondary | ICD-10-CM | POA: Diagnosis not present

## 2017-03-22 DIAGNOSIS — I10 Essential (primary) hypertension: Secondary | ICD-10-CM | POA: Diagnosis not present

## 2017-03-22 NOTE — Patient Outreach (Signed)
[facility]) Care Management  03/22/2017  Collin Gonzales 1935-08-19 [id number]   Referral Date: 03/22/17 Referral Source:  [employer] Report Date of Admission: 03/04/17 Diagnosis:  Pneumonia Date of Discharge: 03/20/17 Facility: [facility] Insurance: [place] attempt # 1 Spoke with patient he is able to verify HIPAA.  Patient reports he is doing ok since being home.  [facility] for [facility] after hospital stay for pneumonia. Patient states that the nurse from home health to come by today between 2-3 for initial visit.  Patient does not remember the name of the home health company the nurse is coming from but just knows that she will be visiting today.    Social: Patient lives alone but states he able to bathe and dress himself and that he still drives to appointments. Patient states he has support of daughter and granddaughter for meals and housekeeping.   Conditions: Patient recently admitted for pneumonia. He has a history of COPD, GERD, and prostate cancer  Medications: Patient able to review medications and no problems noted.    Appointments: Patient saw primary care physician yesterday for follow up.    Consent: RN CM reviewed [facility] services with patient. Patient declined services at this time.  He states he has a Marine scientist from [facility] who calls him and he can call.    Plan: RN CM will send letter and brochure to patient. RN CM will notify care management assistant of case status.     Jone Baseman, RN, MSN [facility] Care Management Care Management Coordinator Direct Line [phone] Toll Free: [phone]  Fax: [phone]

## 2017-03-23 DIAGNOSIS — J189 Pneumonia, unspecified organism: Secondary | ICD-10-CM | POA: Diagnosis not present

## 2017-03-23 DIAGNOSIS — E785 Hyperlipidemia, unspecified: Secondary | ICD-10-CM | POA: Diagnosis not present

## 2017-03-23 DIAGNOSIS — J449 Chronic obstructive pulmonary disease, unspecified: Secondary | ICD-10-CM | POA: Diagnosis not present

## 2017-03-23 DIAGNOSIS — N390 Urinary tract infection, site not specified: Secondary | ICD-10-CM | POA: Diagnosis not present

## 2017-03-23 DIAGNOSIS — I10 Essential (primary) hypertension: Secondary | ICD-10-CM | POA: Diagnosis not present

## 2017-03-23 DIAGNOSIS — M059 Rheumatoid arthritis with rheumatoid factor, unspecified: Secondary | ICD-10-CM | POA: Diagnosis not present

## 2017-03-27 DIAGNOSIS — M059 Rheumatoid arthritis with rheumatoid factor, unspecified: Secondary | ICD-10-CM | POA: Diagnosis not present

## 2017-03-27 DIAGNOSIS — J449 Chronic obstructive pulmonary disease, unspecified: Secondary | ICD-10-CM | POA: Diagnosis not present

## 2017-03-27 DIAGNOSIS — J189 Pneumonia, unspecified organism: Secondary | ICD-10-CM | POA: Diagnosis not present

## 2017-03-27 DIAGNOSIS — N390 Urinary tract infection, site not specified: Secondary | ICD-10-CM | POA: Diagnosis not present

## 2017-03-27 DIAGNOSIS — I10 Essential (primary) hypertension: Secondary | ICD-10-CM | POA: Diagnosis not present

## 2017-03-27 DIAGNOSIS — E785 Hyperlipidemia, unspecified: Secondary | ICD-10-CM | POA: Diagnosis not present

## 2017-03-28 DIAGNOSIS — E785 Hyperlipidemia, unspecified: Secondary | ICD-10-CM | POA: Diagnosis not present

## 2017-03-28 DIAGNOSIS — I10 Essential (primary) hypertension: Secondary | ICD-10-CM | POA: Diagnosis not present

## 2017-03-28 DIAGNOSIS — J449 Chronic obstructive pulmonary disease, unspecified: Secondary | ICD-10-CM | POA: Diagnosis not present

## 2017-03-28 DIAGNOSIS — J189 Pneumonia, unspecified organism: Secondary | ICD-10-CM | POA: Diagnosis not present

## 2017-03-28 DIAGNOSIS — M059 Rheumatoid arthritis with rheumatoid factor, unspecified: Secondary | ICD-10-CM | POA: Diagnosis not present

## 2017-03-28 DIAGNOSIS — N390 Urinary tract infection, site not specified: Secondary | ICD-10-CM | POA: Diagnosis not present

## 2017-03-31 DIAGNOSIS — E785 Hyperlipidemia, unspecified: Secondary | ICD-10-CM | POA: Diagnosis not present

## 2017-03-31 DIAGNOSIS — M059 Rheumatoid arthritis with rheumatoid factor, unspecified: Secondary | ICD-10-CM | POA: Diagnosis not present

## 2017-03-31 DIAGNOSIS — N390 Urinary tract infection, site not specified: Secondary | ICD-10-CM | POA: Diagnosis not present

## 2017-03-31 DIAGNOSIS — I10 Essential (primary) hypertension: Secondary | ICD-10-CM | POA: Diagnosis not present

## 2017-03-31 DIAGNOSIS — J449 Chronic obstructive pulmonary disease, unspecified: Secondary | ICD-10-CM | POA: Diagnosis not present

## 2017-03-31 DIAGNOSIS — J189 Pneumonia, unspecified organism: Secondary | ICD-10-CM | POA: Diagnosis not present

## 2017-04-03 DIAGNOSIS — I10 Essential (primary) hypertension: Secondary | ICD-10-CM | POA: Diagnosis not present

## 2017-04-03 DIAGNOSIS — J449 Chronic obstructive pulmonary disease, unspecified: Secondary | ICD-10-CM | POA: Diagnosis not present

## 2017-04-03 DIAGNOSIS — M059 Rheumatoid arthritis with rheumatoid factor, unspecified: Secondary | ICD-10-CM | POA: Diagnosis not present

## 2017-04-03 DIAGNOSIS — J189 Pneumonia, unspecified organism: Secondary | ICD-10-CM | POA: Diagnosis not present

## 2017-04-03 DIAGNOSIS — E785 Hyperlipidemia, unspecified: Secondary | ICD-10-CM | POA: Diagnosis not present

## 2017-04-03 DIAGNOSIS — N390 Urinary tract infection, site not specified: Secondary | ICD-10-CM | POA: Diagnosis not present

## 2017-04-06 ENCOUNTER — Encounter: Payer: Self-pay | Admitting: Urology

## 2017-04-06 ENCOUNTER — Ambulatory Visit: Payer: Medicare HMO | Admitting: Urology

## 2017-04-06 VITALS — BP 132/80 | HR 88 | Ht 68.0 in | Wt 121.3 lb

## 2017-04-06 DIAGNOSIS — J189 Pneumonia, unspecified organism: Secondary | ICD-10-CM | POA: Diagnosis not present

## 2017-04-06 DIAGNOSIS — J449 Chronic obstructive pulmonary disease, unspecified: Secondary | ICD-10-CM | POA: Diagnosis not present

## 2017-04-06 DIAGNOSIS — M059 Rheumatoid arthritis with rheumatoid factor, unspecified: Secondary | ICD-10-CM | POA: Diagnosis not present

## 2017-04-06 DIAGNOSIS — E785 Hyperlipidemia, unspecified: Secondary | ICD-10-CM | POA: Diagnosis not present

## 2017-04-06 DIAGNOSIS — I10 Essential (primary) hypertension: Secondary | ICD-10-CM | POA: Diagnosis not present

## 2017-04-06 DIAGNOSIS — R339 Retention of urine, unspecified: Secondary | ICD-10-CM | POA: Diagnosis not present

## 2017-04-06 DIAGNOSIS — N390 Urinary tract infection, site not specified: Secondary | ICD-10-CM | POA: Diagnosis not present

## 2017-04-06 MED ORDER — FINASTERIDE 5 MG PO TABS: 5.0000 mg | ORAL_TABLET | Freq: Every day | ORAL | 11 refills | Status: DC

## 2017-04-06 MED ORDER — TAMSULOSIN HCL 0.4 MG PO CAPS: 0.8000 mg | ORAL_CAPSULE | Freq: Every day | ORAL | 11 refills | Status: DC

## 2017-04-06 NOTE — Progress Notes (Signed)
04/06/2017 3:09 PM   Collin Gonzales 07-28-1935 [id number]  Referring provider: Roselee Nova, MD [address] [place], [place] [address]  Chief Complaint  Patient presents with  . Prostate Cancer    HPI: The patient is a 82 year old gentleman with a past medical history of prostate cancer status post brachytherapy who presents today to discuss recurrent urinary tract infection.  Most recently a Foley catheter was placed in this office for 550 cc.  He also developed retention of unknown volume in December 2018.  At his last visit, the patient states that for the last 3-4 months he has had difficulty voiding.  He has nocturia x3-4.  He has a weak intermittent stream.  He has to strain to urinate.  He did not feel like he was emptying his bladder.  During this time, the patient has been treated for multiple urinary tract infections over the last few months.  In review of his cultures, all of them are not true positive cultures or were contaminated with either coagulase-negative staph or strep viridans.  He then went to the ER and a Foley catheter was placed as discussed above in December 2018.  He has not been started on any new medications.  He does not take any medications for BPH.  In regards to his prostate cancer, he was treated with brachytherapy in approximately 2010. He reports that PSA in the last year was less than 1.  He was started on Flomax in December 2018.  He has now failed to trial of void but  he was voiding appropriately between these 2 episodes for approximately 2 months.   PMH: Past Medical History:  Diagnosis Date  . Arthritis    rheumatoid arthritis  . Cancer ([place])   . Collagen vascular disease ([place])   . COPD (chronic obstructive pulmonary disease) ([place])   . GERD (gastroesophageal reflux disease)   . History of prostate cancer   . HOH (hard of hearing)   . Insomnia   . Lung cancer ([place])   . Oxygen deficit    2L HS AND PRN  . Stroke  ([employer])    2000  . Wheezing     Surgical History: Past Surgical History:  Procedure Laterality Date  . APPENDECTOMY  1965  . CATARACT EXTRACTION W/PHACO Right 01/05/2016   Procedure: CATARACT EXTRACTION PHACO AND INTRAOCULAR LENS PLACEMENT (IOC);  Surgeon: [name], MD;  Location: [facility] ORS;  Service: Ophthalmology;  Laterality: Right;  [place] 1.14 AP% 18.1 CDE 13.44 FLUID PACK LOT # [id number]  . ENDOBRONCHIAL ULTRASOUND N/A 02/26/2015   Procedure: ENDOBRONCHIAL ULTRASOUND;  Surgeon: Flora Lipps, MD;  Location: [facility] ORS;  Service: Cardiopulmonary;  Laterality: N/A;  . HERNIA REPAIR  1965    Home Medications:  Allergies as of 04/06/2017      Reactions   Plaquenil [hydroxychloroquine]    Simvastatin Other (See Comments)   Bad dreams      Medication List        Accurate as of 04/06/17  3:09 PM. Always use your most recent med list.          ADVAIR DISKUS 250-50 MCG/DOSE Aepb Generic drug:  Fluticasone-Salmeterol Inhale 1 puff into the lungs every morning.   albuterol 108 (90 Base) MCG/ACT inhaler Commonly known as:  PROAIR HFA Inhale 2 puffs into the lungs every 6 (six) hours as needed for wheezing.   aspirin EC 81 MG tablet Take 81 mg by mouth daily.   atorvastatin 10 MG  tablet Commonly known as:  LIPITOR TAKE 1 TABLET (10 MG TOTAL) BY MOUTH DAILY AT 6 PM.   B-complex with vitamin C tablet Take 1 tablet by mouth daily.   folic acid 1 MG tablet Commonly known as:  FOLVITE Take 1 tablet (1 mg total) by mouth daily.   methotrexate 2.5 MG tablet Take 20 mg by mouth every Tuesday. With Supper   mirtazapine 15 MG tablet Commonly known as:  REMERON Take 1 tablet (15 mg total) by mouth at bedtime.   omeprazole 40 MG capsule Commonly known as:  PRILOSEC Take 1 capsule (40 mg total) by mouth daily.   SPIRIVA HANDIHALER 18 MCG inhalation capsule Generic drug:  tiotropium Place into inhaler and inhale 2 (two) times daily.   tamsulosin 0.4 MG Caps  capsule Commonly known as:  FLOMAX Take 1 capsule (0.4 mg total) by mouth daily.   traZODone 100 MG tablet Commonly known as:  DESYREL Take 1 tablet (100 mg total) by mouth at bedtime.       Allergies:  Allergies  Allergen Reactions  . Plaquenil [Hydroxychloroquine]   . Simvastatin Other (See Comments)    Bad dreams    Family History: Family History  Problem Relation Age of Onset  . Cancer Mother   . Cancer Father        Lung Cancer  . Cancer Sister   . Cancer Brother     Social History:  reports that he quit smoking about 4 years ago. he has never used smokeless tobacco. He reports that he does not drink alcohol or use drugs.  ROS: UROLOGY Frequent Urination?: No Hard to postpone urination?: Yes Burning/pain with urination?: No Get up at night to urinate?: Yes Leakage of urine?: No Urine stream starts and stops?: Yes Trouble starting stream?: No Do you have to strain to urinate?: No Blood in urine?: No Urinary tract infection?: No Sexually transmitted disease?: No Injury to kidneys or bladder?: No Painful intercourse?: No Weak stream?: Yes Erection problems?: Yes Penile pain?: No  Gastrointestinal Nausea?: No Vomiting?: No Indigestion/heartburn?: No Diarrhea?: No Constipation?: No  Constitutional Fever: No Night sweats?: No Weight loss?: Yes Fatigue?: Yes  Skin Skin rash/lesions?: No Itching?: No  Eyes Blurred vision?: No Double vision?: No  Ears/Nose/Throat Sore throat?: No Sinus problems?: No  Hematologic/Lymphatic Swollen glands?: No Easy bruising?: No  Cardiovascular Leg swelling?: No Chest pain?: No  Respiratory Cough?: No Shortness of breath?: No  Endocrine Excessive thirst?: No  Musculoskeletal Back pain?: No Joint pain?: No  Neurological Headaches?: No Dizziness?: No  Psychologic Depression?: No Anxiety?: No  Physical Exam: BP 132/80 (BP Location: Right Arm, Patient Position: Sitting, Cuff Size: Normal)    Pulse 88   Ht 5\' 8"  (1.727 m)   Wt 121 lb 4.8 oz (55 kg)   BMI 18.44 kg/m   Constitutional:  Alert and oriented, No acute distress. HEENT: [place] AT, moist mucus membranes.  Trachea midline, no masses. Cardiovascular: No clubbing, cyanosis, or edema. Respiratory: Normal respiratory effort, no increased work of breathing. GI: Abdomen is soft, nontender, nondistended, no abdominal masses GU: No CVA tenderness.  Skin: No rashes, bruises or suspicious lesions. Lymph: No cervical or inguinal adenopathy. Neurologic: Grossly intact, no focal deficits, moving all 4 extremities. Psychiatric: Normal mood and affect.  Laboratory Data: Lab Results  Component Value Date   WBC 4.2 03/04/2017   HGB 9.3 (L) 03/04/2017   HCT 28.7 (L) 03/04/2017   MCV 93.7 03/04/2017   PLT 75 (L) 03/04/2017  Lab Results  Component Value Date   CREATININE 1.03 03/02/2017    No results found for: PSA  No results found for: TESTOSTERONE  No results found for: HGBA1C  Urinalysis    Component Value Date/Time   COLORURINE YELLOW (A) 03/01/2017 2243   APPEARANCEUR HAZY (A) 03/01/2017 2243   LABSPEC 1.019 03/01/2017 2243   PHURINE 5.0 03/01/2017 2243   GLUCOSEU NEGATIVE 03/01/2017 2243   HGBUR MODERATE (A) 03/01/2017 2243   BILIRUBINUR neg 03/21/2017 1431   KETONESUR 5 (A) 03/01/2017 2243   PROTEINUR trace 03/21/2017 1431   PROTEINUR 30 (A) 03/01/2017 2243   UROBILINOGEN 0.2 03/21/2017 1431   NITRITE neg 03/21/2017 1431   NITRITE NEGATIVE 03/01/2017 2243   LEUKOCYTESUR Moderate (2+) (A) 03/21/2017 1431    Assessment & Plan:    1.  Urinary retention We will increase the patient's Flomax to 0.8 mg.  We will also add finasteride.  He was warned that the finasteride will take considerable amount of time to have any effect.  We will have him return for an a.m. nurse visit in approximately 1 week.  If he fails this, I would try still one more trial of void in a few weeks after that.  He will need cystoscopy  arranged if he fails 2 more times over the next month.  Of note, the patient has  multiple medical comorbidities and is on home oxygen.  I am not sure he is a surgical candidate if he fails the above.  He may end up being chronic CIC versus chronic Foley catheter.  2.  Prostate cancer Per patient PSA was less than 1 within the last year.  Return in about 1 week (around 04/13/2017) for AM NV for trial of void.  [name], MD  [facility] [address] [place], [place] [address] [phone]

## 2017-04-08 DIAGNOSIS — J449 Chronic obstructive pulmonary disease, unspecified: Secondary | ICD-10-CM | POA: Diagnosis not present

## 2017-04-12 DIAGNOSIS — E785 Hyperlipidemia, unspecified: Secondary | ICD-10-CM | POA: Diagnosis not present

## 2017-04-12 DIAGNOSIS — J449 Chronic obstructive pulmonary disease, unspecified: Secondary | ICD-10-CM | POA: Diagnosis not present

## 2017-04-12 DIAGNOSIS — N390 Urinary tract infection, site not specified: Secondary | ICD-10-CM | POA: Diagnosis not present

## 2017-04-12 DIAGNOSIS — J189 Pneumonia, unspecified organism: Secondary | ICD-10-CM | POA: Diagnosis not present

## 2017-04-12 DIAGNOSIS — M059 Rheumatoid arthritis with rheumatoid factor, unspecified: Secondary | ICD-10-CM | POA: Diagnosis not present

## 2017-04-12 DIAGNOSIS — I10 Essential (primary) hypertension: Secondary | ICD-10-CM | POA: Diagnosis not present

## 2017-04-13 ENCOUNTER — Ambulatory Visit: Payer: Medicare HMO

## 2017-04-24 ENCOUNTER — Other Ambulatory Visit: Payer: Self-pay

## 2017-04-24 ENCOUNTER — Ambulatory Visit (INDEPENDENT_AMBULATORY_CARE_PROVIDER_SITE_OTHER): Payer: Medicare HMO | Admitting: Family Medicine

## 2017-04-24 ENCOUNTER — Ambulatory Visit (INDEPENDENT_AMBULATORY_CARE_PROVIDER_SITE_OTHER): Payer: Medicare HMO

## 2017-04-24 ENCOUNTER — Encounter: Payer: Self-pay | Admitting: Family Medicine

## 2017-04-24 VITALS — BP 120/58 | HR 88 | Temp 98.0°F | Resp 16 | Wt 122.6 lb

## 2017-04-24 VITALS — BP 110/61 | HR 77 | Ht 68.0 in | Wt 120.0 lb

## 2017-04-24 DIAGNOSIS — R339 Retention of urine, unspecified: Secondary | ICD-10-CM | POA: Diagnosis not present

## 2017-04-24 DIAGNOSIS — I700 Atherosclerosis of aorta: Secondary | ICD-10-CM | POA: Diagnosis not present

## 2017-04-24 DIAGNOSIS — K219 Gastro-esophageal reflux disease without esophagitis: Secondary | ICD-10-CM | POA: Diagnosis not present

## 2017-04-24 DIAGNOSIS — F5104 Psychophysiologic insomnia: Secondary | ICD-10-CM | POA: Diagnosis not present

## 2017-04-24 DIAGNOSIS — I69354 Hemiplegia and hemiparesis following cerebral infarction affecting left non-dominant side: Secondary | ICD-10-CM | POA: Diagnosis not present

## 2017-04-24 DIAGNOSIS — J449 Chronic obstructive pulmonary disease, unspecified: Secondary | ICD-10-CM

## 2017-04-24 DIAGNOSIS — E7800 Pure hypercholesterolemia, unspecified: Secondary | ICD-10-CM

## 2017-04-24 DIAGNOSIS — I2510 Atherosclerotic heart disease of native coronary artery without angina pectoris: Secondary | ICD-10-CM

## 2017-04-24 DIAGNOSIS — E440 Moderate protein-calorie malnutrition: Secondary | ICD-10-CM | POA: Diagnosis not present

## 2017-04-24 DIAGNOSIS — I2584 Coronary atherosclerosis due to calcified coronary lesion: Secondary | ICD-10-CM

## 2017-04-24 DIAGNOSIS — E46 Unspecified protein-calorie malnutrition: Secondary | ICD-10-CM | POA: Insufficient documentation

## 2017-04-24 DIAGNOSIS — Z9189 Other specified personal risk factors, not elsewhere classified: Secondary | ICD-10-CM

## 2017-04-24 DIAGNOSIS — C61 Malignant neoplasm of prostate: Secondary | ICD-10-CM

## 2017-04-24 DIAGNOSIS — D61818 Other pancytopenia: Secondary | ICD-10-CM

## 2017-04-24 DIAGNOSIS — Z9981 Dependence on supplemental oxygen: Secondary | ICD-10-CM | POA: Diagnosis not present

## 2017-04-24 LAB — CBC WITH DIFFERENTIAL/PLATELET
BASOS PCT: 0.3 %
Basophils Absolute: 10 cells/uL (ref 0–200)
EOS PCT: 2.1 %
Eosinophils Absolute: 71 cells/uL (ref 15–500)
HEMATOCRIT: 33.5 % — AB (ref 38.5–50.0)
Hemoglobin: 11 g/dL — ABNORMAL LOW (ref 13.2–17.1)
LYMPHS ABS: 966 {cells}/uL (ref 850–3900)
MCH: 30.2 pg (ref 27.0–33.0)
MCHC: 32.8 g/dL (ref 32.0–36.0)
MCV: 92 fL (ref 80.0–100.0)
MPV: 10 fL (ref 7.5–12.5)
Monocytes Relative: 9.6 %
NEUTROS ABS: 2026 {cells}/uL (ref 1500–7800)
Neutrophils Relative %: 59.6 %
Platelets: 302 10*3/uL (ref 140–400)
RBC: 3.64 10*6/uL — AB (ref 4.20–5.80)
RDW: 15.1 % — ABNORMAL HIGH (ref 11.0–15.0)
Total Lymphocyte: 28.4 %
WBC: 3.4 10*3/uL — AB (ref 3.8–10.8)
WBCMIX: 326 {cells}/uL (ref 200–950)

## 2017-04-24 LAB — LIPID PANEL
Cholesterol: 144 mg/dL (ref <200)
HDL: 49 mg/dL (ref >40)
LDL CHOLESTEROL (CALC): 81 mg/dL
NON-HDL CHOLESTEROL (CALC): 95 mg/dL (ref <130)
TRIGLYCERIDES: 61 mg/dL (ref <150)
Total CHOL/HDL Ratio: 2.9 (calc) (ref <5.0)

## 2017-04-24 MED ORDER — ATORVASTATIN CALCIUM 10 MG PO TABS: 10.0000 mg | ORAL_TABLET | Freq: Every day | ORAL | 1 refills | Status: DC

## 2017-04-24 MED ORDER — TRAZODONE HCL 100 MG PO TABS: 100.0000 mg | ORAL_TABLET | Freq: Every day | ORAL | 1 refills | Status: DC

## 2017-04-24 NOTE — Patient Instructions (Signed)
High-Protein and High-Calorie Diet Eating high-protein and high-calorie foods can help you to gain weight, heal after an injury, and recover after an illness or surgery. What is my plan? The specific amount of daily protein and calories you need depends on:  Your body weight.  The reason this diet is recommended for you.  Generally, a high-protein, high-calorie diet involves:  Eating 250-500 extra calories each day.  Making sure that 10-35% of your daily calories come from protein.  Talk to your health care provider about how much protein and how many calories you need each day. Follow the diet as directed by your health care provider. What do I need to know about this diet?  Ask your health care provider if you should take a nutritional supplement.  Try to eat six small meals each day instead of three large meals.  Eat a balanced diet, including one food that is high in protein at each meal.  Keep nutritious snacks handy, such as nuts, trail mixes, dried fruit, and yogurt.  If you have kidney disease or diabetes, eating too much protein may put extra stress on your kidneys. Talk to your health care provider if you have either of those conditions. What are some high-protein foods? Grains Quinoa. Bulgur wheat. Vegetables Soybeans. Peas. Meats and Other Protein Sources Beef, pork, and poultry. Fish and seafood. Eggs. Tofu. Textured vegetable protein (TVP). Peanut butter. Nuts and seeds. Dried beans. Protein powders. Dairy Whole milk. Whole-milk yogurt. Powdered milk. Cheese. [employer]. Eggnog. Beverages High-protein supplement drinks. Soy milk. Other Protein bars. The items listed above may not be a complete list of recommended foods or beverages. Contact your dietitian for more options. What are some high-calorie foods? Grains Pasta. Quick breads. Muffins. Pancakes. Ready-to-eat cereal. Vegetables Vegetables cooked in oil or butter. Fried potatoes. Fruits Dried  fruit. Fruit leather. Canned fruit in syrup. Fruit juice. Avocados. Meats and Other Protein Sources Peanut butter. Nuts and seeds. Dairy Heavy cream. Whipped cream. Cream cheese. Sour cream. Ice cream. Custard. Pudding. Beverages Meal-replacement beverages. Nutrition shakes. Fruit juice. Sugar-sweetened soft drinks. Condiments Salad dressing. Mayonnaise. Alfredo sauce. Fruit preserves or jelly. Honey. Syrup. Sweets/Desserts Cake. Cookies. Pie. Pastries. Candy bars. Chocolate. Fats and Oils Butter or margarine. Oil. Gravy. Other Meal-replacement bars. The items listed above may not be a complete list of recommended foods or beverages. Contact your dietitian for more options. What are some tips for including high-protein and high-calorie foods in my diet?  Add whole milk, half-and-half, or heavy cream to cereal, pudding, soup, or hot cocoa.  Add whole milk to instant breakfast drinks.  Add peanut butter to oatmeal or smoothies.  Add powdered milk to baked goods, smoothies, or milkshakes.  Add powdered milk, cream, or butter to mashed potatoes.  Add cheese to cooked vegetables.  Make whole-milk yogurt parfaits. Top them with granola, fruit, or nuts.  Add cottage cheese to your fruit.  Add avocados, cheese, or both to sandwiches or salads.  Add meat, poultry, or seafood to rice, pasta, casseroles, salads, and soups.  Use mayonnaise when making egg salad, chicken salad, or tuna salad.  Use peanut butter as a topping for pretzels, celery, or crackers.  Add beans to casseroles, dips, and spreads.  Add pureed beans to sauces and soups.  Replace calorie-free drinks with calorie-containing drinks, such as milk and fruit juice. This information is not intended to replace advice given to you by your health care provider. Make sure you discuss any questions you have with your health  care provider. Document Released: 02/07/2005 Document Revised: 07/16/2015 Document Reviewed:  07/23/2013 Elsevier Interactive Patient Education  [employer].

## 2017-04-24 NOTE — Telephone Encounter (Signed)
Refill Request for Cholesterol medication. Atorvastatin to [facility].   Last visit: 03/21/2017  Lab Results  Component Value Date   CHOL 167 05/30/2016   HDL 55 05/30/2016   LDLCALC 99 05/30/2016   TRIG 65 05/30/2016   CHOLHDL 3.0 05/30/2016    No Follow-up on file.

## 2017-04-24 NOTE — Progress Notes (Signed)
Name: Collin Gonzales   MRN: [id number]    DOB: 25-Nov-1935   Date:04/24/2017       Progress Note  Subjective  Chief Complaint  Chief Complaint  Patient presents with  . Medication Refill  . COPD    HPI  Malnutrition: his weight has gone down from 140 lbs 04/2015 down to 112 lbs but slightly up today at 122. He has poor appetite, eating two meals a day. Discussed a high calorie diet with him today. Albumin down to 2.9  COPD/emphysema: history of CAP back 02/2017, he is doing better now, back to baseline cough and SOB, he is oxygen dependent. He denies wheezing. He has a history of lung mass, treated by Dr. Baruch Gouty with radiation and last CT done 07/2016 showed lesion was gone.   Atherosclerosis of aorta and coronary vessel disease: found on CT scan done 07/2016. He is on statin therapy and takes aspirin sometimes. Discussed cardiovascular risk. He also has a history of CVA, with weakness of left forefoot, but he does not limp.   Prostate Cancer: history of prostate seed implants, he was doing well, however earlier this ear had an UTI and urinary retention, seen by Dr. Pilar Jarvis today and foley removed this morning. He has voided since.   GERD: he is not sure if he is taking omeprazole, denies heart burn or indigestion, trying to follow a GERD diet  RA: seeing Dr. Jefm Bryant, takes methothrexate once a week, denies joint pains or swelling  Chronic insomnia: he is on Trazodone for years, he states he was sleeping well , however over the past 6 months he has been waking up during the night and takes him a while to fall back asleep. He goes to bed around 11 pm and gets up between 8-9 am. He denies napping during the day, and is not feeling tired when he gets up. We will hold off on increasing dose of medication at this time  Pancytopenia: sees Dr. [name] Patient Active Problem List   Diagnosis Date Noted  . Dependence on supplemental oxygen 04/24/2017  . Malnutrition ([place]) 04/24/2017  .  Hemiparesis affecting left side as late effect of cerebrovascular accident (CVA) ([place]) 04/24/2017  . Cataract 09/06/2016  . Drug-induced folate deficiency anemia 09/06/2016  . Esophagitis, reflux 09/06/2016  . Hypogammaglobulinemia ([place]) 09/06/2016  . Decreased leukocytes 09/06/2016  . Iron deficiency anemia due to chronic blood loss 08/23/2016  . Pancytopenia ([place]) 08/22/2016  . History of stroke with residual effects 06/06/2016  . Decrease in appetite 09/01/2015  . GERD (gastroesophageal reflux disease) 08/05/2015  . COPD (chronic obstructive pulmonary disease) ([place]) 08/20/2014  . Dyslipidemia 08/20/2014  . Chronic insomnia 08/20/2014  . Lung mass 08/20/2014  . Arthritis or polyarthritis, rheumatoid ([place]) 08/20/2014  . Prostate cancer ([place]) 11/01/2013  . Flutter-fibrillation 03/18/2005    Past Surgical History:  Procedure Laterality Date  . APPENDECTOMY  1965  . CATARACT EXTRACTION W/PHACO Right 01/05/2016   Procedure: CATARACT EXTRACTION PHACO AND INTRAOCULAR LENS PLACEMENT (IOC);  Surgeon: [name], MD;  Location: [facility] ORS;  Service: Ophthalmology;  Laterality: Right;  [place] 1.14 AP% 18.1 CDE 13.44 FLUID PACK LOT # [id number]  . ENDOBRONCHIAL ULTRASOUND N/A 02/26/2015   Procedure: ENDOBRONCHIAL ULTRASOUND;  Surgeon: Flora Lipps, MD;  Location: [facility] ORS;  Service: Cardiopulmonary;  Laterality: N/A;  . HERNIA REPAIR  1965    Family History  Problem Relation Age of Onset  . Cancer Mother   . Cancer Father  Lung Cancer  . Cancer Sister   . Cancer Brother     Social History   Socioeconomic History  . Marital status: Single    Spouse name: Not on file  . Number of children: 5  . Years of education: Not on file  . Highest education level: Not on file  Social Needs  . Financial resource strain: Not very hard  . Food insecurity - worry: Never true  . Food insecurity - inability: Never true  . Transportation needs - medical: No  . Transportation needs -  non-medical: No  Occupational History  . Occupation: Retired  Tobacco Use  . Smoking status: Former Smoker    Last attempt to quit: 02/21/2013    Years since quitting: 4.1  . Smokeless tobacco: Never Used  Substance and Sexual Activity  . Alcohol use: No    Alcohol/week: 0.0 oz  . Drug use: No  . Sexual activity: No  Other Topics Concern  . Not on file  Social History Narrative  . Not on file     Current Outpatient Medications:  .  albuterol (PROAIR HFA) 108 (90 Base) MCG/ACT inhaler, Inhale 2 puffs into the lungs every 6 (six) hours as needed for wheezing., Disp: 3 Inhaler, Rfl: 2 .  aspirin EC 81 MG tablet, Take 81 mg by mouth daily., Disp: , Rfl:  .  atorvastatin (LIPITOR) 10 MG tablet, TAKE 1 TABLET (10 MG TOTAL) BY MOUTH DAILY AT 6 PM., Disp: 90 tablet, Rfl: 0 .  B Complex-C (B-COMPLEX WITH VITAMIN C) tablet, Take 1 tablet by mouth daily., Disp: , Rfl:  .  finasteride (PROSCAR) 5 MG tablet, Take 1 tablet (5 mg total) by mouth daily., Disp: 30 tablet, Rfl: 11 .  Fluticasone-Salmeterol (ADVAIR DISKUS) 250-50 MCG/DOSE AEPB, Inhale 1 puff into the lungs every morning. , Disp: , Rfl:  .  folic acid (FOLVITE) 1 MG tablet, Take 1 tablet (1 mg total) by mouth daily., Disp: 30 tablet, Rfl: 5 .  methotrexate (RHEUMATREX) 2.5 MG tablet, TAKE 9 TABLETS (22.5 MG TOTAL) BY MOUTH EVERY 7 (SEVEN) DAYS., Disp: , Rfl:  .  tamsulosin (FLOMAX) 0.4 MG CAPS capsule, Take 1 capsule (0.4 mg total) by mouth daily., Disp: 30 capsule, Rfl: 11 .  tiotropium (SPIRIVA HANDIHALER) 18 MCG inhalation capsule, Place into inhaler and inhale 2 (two) times daily. , Disp: , Rfl:  .  traZODone (DESYREL) 100 MG tablet, Take 1 tablet (100 mg total) by mouth at bedtime., Disp: 90 tablet, Rfl: 0 .  omeprazole (PRILOSEC) 40 MG capsule, Take 1 capsule (40 mg total) by mouth daily. (Patient not taking: Reported on 04/24/2017), Disp: 90 capsule, Rfl: 1  Allergies  Allergen Reactions  . Plaquenil [Hydroxychloroquine]   .  Simvastatin Other (See Comments)    Bad dreams     ROS  Constitutional: Negative for fever , positive for  weight change.  Respiratory: Positive  for cough and shortness of breath.   Cardiovascular: Negative for chest pain or palpitations.  Gastrointestinal: Negative for abdominal pain, no bowel changes.  Musculoskeletal: Negative for gait problem or joint swelling.  Skin: Negative for rash.  Neurological: Negative for dizziness or headache.  No other specific complaints in a complete review of systems (except as listed in HPI above).  Objective  Vitals:   04/24/17 1342  BP: (!) 120/58  Pulse: 88  Resp: 16  Temp: 98 F (36.7 C)  TempSrc: Oral  SpO2: 94%  Weight: 122 lb 9.6 oz (55.6 kg)  Body mass index is 18.64 kg/m.  Physical Exam  Constitutional: Patient appears well-developed and very thin. No distress.  HEENT: head atraumatic, normocephalic, pupils equal and reactive to light,using supplemental oxygen , neck supple, throat within normal limits Cardiovascular: Normal rate, regular rhythm and normal heart sounds.  No murmur heard. No BLE edema. Pulmonary/Chest: Effort normal and breath sounds normal. No respiratory distress. Abdominal: Soft.  There is no tenderness. Psychiatric: Patient has a normal mood and affect. behavior is normal. Judgment and thought content normal.  Recent Results (from the past 2160 hour(s))  Bladder Scan (Post Void Residual) in office     Status: None   Collection Time: 02/02/17  4:05 PM  Result Value Ref Range   Scan Result 18   Comprehensive metabolic panel     Status: Abnormal   Collection Time: 03/01/17  7:06 PM  Result Value Ref Range   Sodium 136 135 - 145 mmol/L   Potassium 3.6 3.5 - 5.1 mmol/L   Chloride 100 (L) 101 - 111 mmol/L   CO2 27 22 - 32 mmol/L   Glucose, Bld 108 (H) 65 - 99 mg/dL   BUN 22 (H) 6 - 20 mg/dL   Creatinine, Ser 1.25 (H) 0.61 - 1.24 mg/dL   Calcium 8.5 (L) 8.9 - 10.3 mg/dL   Total Protein 8.9 (H) 6.5 -  8.1 g/dL   Albumin 2.9 (L) 3.5 - 5.0 g/dL   AST 29 15 - 41 U/L   ALT 11 (L) 17 - 63 U/L   Alkaline Phosphatase 83 38 - 126 U/L   Total Bilirubin 0.6 0.3 - 1.2 mg/dL   GFR calc non Af Amer 53 (L) >60 mL/min   GFR calc Af Amer >60 >60 mL/min    Comment: (NOTE) The eGFR has been calculated using the CKD EPI equation. This calculation has not been validated in all clinical situations. eGFR's persistently <60 mL/min signify possible Chronic Kidney Disease.    Anion gap 9 5 - 15    Comment: Performed at [facility], [address] [place], [place] [address]  CBC WITH DIFFERENTIAL     Status: Abnormal   Collection Time: 03/01/17  7:06 PM  Result Value Ref Range   WBC 3.5 (L) 3.8 - 10.6 K/uL   RBC 3.73 (L) 4.40 - 5.90 MIL/uL   Hemoglobin 11.4 (L) 13.0 - 18.0 g/dL   HCT 34.8 (L) 40.0 - 52.0 %   MCV 93.4 80.0 - 100.0 fL   MCH 30.5 26.0 - 34.0 pg   MCHC 32.6 32.0 - 36.0 g/dL   RDW 16.2 (H) 11.5 - 14.5 %   Platelets 86 (L) 150 - 440 K/uL   Neutrophils Relative % 85 %   Neutro Abs 3.0 1.4 - 6.5 K/uL   Lymphocytes Relative 12 %   Lymphs Abs 0.4 (L) 1.0 - 3.6 K/uL   Monocytes Relative 3 %   Monocytes Absolute 0.1 (L) 0.2 - 1.0 K/uL   Eosinophils Relative 0 %   Eosinophils Absolute 0.0 0 - 0.7 K/uL   Basophils Relative 0 %   Basophils Absolute 0.0 0 - 0.1 K/uL    Comment: Performed at [facility], [address] [place], [place] [address]  Troponin I     Status: None   Collection Time: 03/01/17  7:06 PM  Result Value Ref Range   Troponin I <0.03 <0.03 ng/mL    Comment: Performed at [facility], [address] [place], [place] [address]  Procalcitonin -  Baseline     Status: None   Collection Time: 03/01/17  7:06 PM  Result Value Ref Range   Procalcitonin 0.11 ng/mL    Comment:        Interpretation: PCT (Procalcitonin) <= 0.5 ng/mL: Systemic infection (sepsis) is not likely. Local bacterial infection is possible. (NOTE)       Sepsis PCT  Algorithm           Lower Respiratory Tract                                      Infection PCT Algorithm    ----------------------------     ----------------------------         PCT < 0.25 ng/mL                PCT < 0.10 ng/mL         Strongly encourage             Strongly discourage   discontinuation of antibiotics    initiation of antibiotics    ----------------------------     -----------------------------       PCT 0.25 - 0.50 ng/mL            PCT 0.10 - 0.25 ng/mL               OR       >80% decrease in PCT            Discourage initiation of                                            antibiotics      Encourage discontinuation           of antibiotics    ----------------------------     -----------------------------         PCT >= 0.50 ng/mL              PCT 0.26 - 0.50 ng/mL               AND        <80% decrease in PCT             Encourage initiation of                                             antibiotics       Encourage continuation           of antibiotics    ----------------------------     -----------------------------        PCT >= 0.50 ng/mL                  PCT > 0.50 ng/mL               AND         increase in PCT                  Strongly encourage                                      initiation of  antibiotics    Strongly encourage escalation           of antibiotics                                     -----------------------------                                           PCT <= 0.25 ng/mL                                                 OR                                        > 80% decrease in PCT                                     Discontinue / Do not initiate                                             antibiotics Performed at [facility], [address] [place], [place] [address]   Blood Culture (routine x 2)     Status: None   Collection Time: 03/01/17  7:07 PM  Result Value Ref Range   Specimen Description BLOOD LAC    Special  Requests      BOTTLES DRAWN AEROBIC AND ANAEROBIC Blood Culture adequate volume   Culture      NO GROWTH 5 DAYS Performed at [facility], [address], [place], [place] [address]    Report Status 03/06/2017 FINAL   Lactic acid, plasma     Status: Abnormal   Collection Time: 03/01/17  7:07 PM  Result Value Ref Range   Lactic Acid, Venous 2.0 (HH) 0.5 - 1.9 mmol/L    Comment: CRITICAL RESULT CALLED TO, READ BACK BY AND VERIFIED WITH SAMANTHA HAMILTON 03/01/16 1945 KLW Performed at [facility], [address] [place], [place] [address]   Blood Culture (routine x 2)     Status: None   Collection Time: 03/01/17  7:09 PM  Result Value Ref Range   Specimen Description BLOOD RAC    Special Requests      BOTTLES DRAWN AEROBIC AND ANAEROBIC Blood Culture adequate volume   Culture      NO GROWTH 5 DAYS Performed at [facility], [address] [place], [place] [address]    Report Status 03/06/2017 FINAL   Influenza panel by PCR (type A & B)     Status: None   Collection Time: 03/01/17  7:19 PM  Result Value Ref Range   Influenza A By PCR NEGATIVE NEGATIVE   Influenza B By PCR NEGATIVE NEGATIVE    Comment: (NOTE) The Xpert Xpress Flu assay is intended as an aid in the diagnosis of  influenza and should not be used as a sole basis for  treatment.  This  assay is FDA approved for nasopharyngeal swab specimens only. Nasal  washings and aspirates are unacceptable for Xpert Xpress Flu testing. Performed at [facility], [address] [place], [place] [address]   Lactic acid, plasma     Status: None   Collection Time: 03/01/17  8:51 PM  Result Value Ref Range   Lactic Acid, Venous 1.3 0.5 - 1.9 mmol/L    Comment: Performed at [facility], [address] [place], [place] [address]  Urinalysis, Routine w reflex microscopic     Status: Abnormal   Collection Time: 03/01/17 10:43 PM  Result Value Ref Range   Color, Urine YELLOW (A)  YELLOW   APPearance HAZY (A) CLEAR   Specific Gravity, Urine 1.019 1.005 - 1.030   pH 5.0 5.0 - 8.0   Glucose, UA NEGATIVE NEGATIVE mg/dL   Hgb urine dipstick MODERATE (A) NEGATIVE   Bilirubin Urine NEGATIVE NEGATIVE   Ketones, ur 5 (A) NEGATIVE mg/dL   Protein, ur 30 (A) NEGATIVE mg/dL   Nitrite NEGATIVE NEGATIVE   Leukocytes, UA MODERATE (A) NEGATIVE   RBC / HPF 6-30 0 - 5 RBC/hpf   WBC, UA TOO NUMEROUS TO COUNT 0 - 5 WBC/hpf   Bacteria, UA NONE SEEN NONE SEEN   Squamous Epithelial / LPF 0-5 (A) NONE SEEN   Mucus PRESENT     Comment: Performed at [facility], [address], [place], [place] [address]  Urine culture     Status: Abnormal   Collection Time: 03/01/17 10:43 PM  Result Value Ref Range   Specimen Description      URINE, RANDOM Performed at [facility], [address], [place], [place] [address]    Special Requests      NONE Performed at [facility], [address], [place], [place] [address]    Culture (A)     <10,000 COLONIES/mL INSIGNIFICANT GROWTH Performed at [facility] [address] [place], [place] [address]    Report Status 03/03/2017 FINAL   MRSA PCR Screening     Status: None   Collection Time: 03/02/17 12:02 AM  Result Value Ref Range   MRSA by PCR NEGATIVE NEGATIVE    Comment:        The GeneXpert MRSA Assay (FDA approved for NASAL specimens only), is one component of a comprehensive MRSA colonization surveillance program. It is not intended to diagnose MRSA infection nor to guide or monitor treatment for MRSA infections. Performed at [facility], [address] [place], [place] [address]   Basic metabolic panel     Status: Abnormal   Collection Time: 03/02/17  4:06 AM  Result Value Ref Range   Sodium 137 135 - 145 mmol/L   Potassium 3.7 3.5 - 5.1 mmol/L   Chloride 102 101 - 111 mmol/L   CO2 27 22 - 32 mmol/L   Glucose, Bld 106 (H) 65 - 99 mg/dL   BUN 19 6 - 20 mg/dL   Creatinine,  Ser 1.03 0.61 - 1.24 mg/dL   Calcium 8.3 (L) 8.9 - 10.3 mg/dL   GFR calc non Af Amer >60 >60 mL/min   GFR calc Af Amer >60 >60 mL/min    Comment: (NOTE) The eGFR has been calculated using the CKD EPI equation. This calculation has not been validated in all clinical situations. eGFR's persistently <60 mL/min signify possible Chronic Kidney Disease.    Anion gap 8 5 - 15    Comment:  Performed at [facility], [address] [place], [place] [address]  CBC     Status: Abnormal   Collection Time: 03/02/17  4:06 AM  Result Value Ref Range   WBC 3.7 (L) 3.8 - 10.6 K/uL   RBC 3.58 (L) 4.40 - 5.90 MIL/uL   Hemoglobin 10.8 (L) 13.0 - 18.0 g/dL   HCT 33.7 (L) 40.0 - 52.0 %   MCV 94.2 80.0 - 100.0 fL   MCH 30.3 26.0 - 34.0 pg   MCHC 32.1 32.0 - 36.0 g/dL   RDW 16.7 (H) 11.5 - 14.5 %   Platelets 81 (L) 150 - 440 K/uL    Comment: Performed at [facility], [address] [place], [place] [address]  CBC     Status: Abnormal   Collection Time: 03/04/17  3:18 AM  Result Value Ref Range   WBC 4.2 3.8 - 10.6 K/uL   RBC 3.07 (L) 4.40 - 5.90 MIL/uL   Hemoglobin 9.3 (L) 13.0 - 18.0 g/dL   HCT 28.7 (L) 40.0 - 52.0 %   MCV 93.7 80.0 - 100.0 fL   MCH 30.3 26.0 - 34.0 pg   MCHC 32.4 32.0 - 36.0 g/dL   RDW 16.4 (H) 11.5 - 14.5 %   Platelets 75 (L) 150 - 440 K/uL    Comment: Performed at [facility], [address] [place], [place] [address]  POCT urinalysis dipstick     Status: Abnormal   Collection Time: 03/21/17  2:31 PM  Result Value Ref Range   Color, UA yellow    Clarity, UA cloudy    Glucose, UA neg    Bilirubin, UA neg    Ketones, UA neg    Spec Grav, UA <=1.005 (A) 1.010 - 1.025   Blood, UA neg    pH, UA >=9.0 (A) 5.0 - 8.0   Protein, UA trace    Urobilinogen, UA 0.2 0.2 or 1.0 E.U./dL   Nitrite, UA neg    Leukocytes, UA Moderate (2+) (A) Negative   Appearance cloudy    Odor yes       PHQ2/9: Depression screen [facility] 2/9 04/24/2017 03/21/2017  01/03/2017 11/01/2016 08/01/2016  Decreased Interest 0 0 0 0 0  Down, Depressed, Hopeless 0 0 0 0 0  PHQ - 2 Score 0 0 0 0 0     Fall Risk: Fall Risk  04/24/2017 03/21/2017 01/03/2017 11/01/2016 08/01/2016  Falls in the past year? _0   Number falls in past yr: - - - - -  Injury with Fall? - - - - -     Functional Status Survey: Is the patient deaf or have difficulty hearing?: Yes(Wears Hearing Aids but has lost one of them) Does the patient have difficulty seeing, even when wearing glasses/contacts?: No Does the patient have difficulty concentrating, remembering, or making decisions?: No Does the patient have difficulty walking or climbing stairs?: Yes(Wears Oxygen) Does the patient have difficulty dressing or bathing?: No Does the patient have difficulty doing errands alone such as visiting a doctor's office or shopping?: Yes    Assessment & Plan  1. Chronic obstructive pulmonary disease, unspecified COPD type ([place])  Continue follow up with Dr. Raul Del.   2. Dependence on supplemental oxygen   3. Moderate protein-calorie malnutrition ([place])  Discussed high caloric diet and Ensure supplements  4. Pure hypercholesterolemia  On statin therapy, last lipid at goal 05/2016 -lipid panel   5. Hemiparesis affecting left side as late effect of  cerebrovascular accident (CVA) ([place])  stable  6. Chronic insomnia  - traZODone (DESYREL) 100 MG tablet; Take 1 tablet (100 mg total) by mouth at bedtime.  Dispense: 90 tablet; Refill: 1  7. Urinary retention  Continue follow up with Dr. Pilar Jarvis  8. Gastroesophageal reflux disease, esophagitis presence not specified  Following a GERD appropriate diet  9. Prostate cancer ([place])  S/p seed placement  10. Aortic atherosclerosis ([facility])  On statin, advised aspirin daily   11. Coronary artery disease due to calcified coronary lesion  No symptoms  12. Pancytopenia ([facility])  - CBC with Differential/Platelet

## 2017-04-24 NOTE — Progress Notes (Signed)
Pt presented today for a V&T. Pt refused to have a fill and pull.  Therefore 66fr coude foley was removed without difficulty. Reinforced with pt to RTC by 3pm if not able to urinate. Pt voiced understanding.   Blood pressure 110/61, pulse 77, height 5\' 8"  (1.727 m), weight 120 lb (54.4 kg).

## 2017-05-06 DIAGNOSIS — J449 Chronic obstructive pulmonary disease, unspecified: Secondary | ICD-10-CM | POA: Diagnosis not present

## 2017-05-09 DIAGNOSIS — Z79899 Other long term (current) drug therapy: Secondary | ICD-10-CM | POA: Diagnosis not present

## 2017-05-09 DIAGNOSIS — R700 Elevated erythrocyte sedimentation rate: Secondary | ICD-10-CM | POA: Diagnosis not present

## 2017-05-09 DIAGNOSIS — R9389 Abnormal findings on diagnostic imaging of other specified body structures: Secondary | ICD-10-CM | POA: Diagnosis not present

## 2017-05-09 DIAGNOSIS — M0579 Rheumatoid arthritis with rheumatoid factor of multiple sites without organ or systems involvement: Secondary | ICD-10-CM | POA: Diagnosis not present

## 2017-05-09 DIAGNOSIS — R0609 Other forms of dyspnea: Secondary | ICD-10-CM | POA: Diagnosis not present

## 2017-06-06 DIAGNOSIS — J449 Chronic obstructive pulmonary disease, unspecified: Secondary | ICD-10-CM | POA: Diagnosis not present

## 2017-07-04 DIAGNOSIS — Z113 Encounter for screening for infections with a predominantly sexual mode of transmission: Secondary | ICD-10-CM | POA: Diagnosis not present

## 2017-07-06 DIAGNOSIS — J449 Chronic obstructive pulmonary disease, unspecified: Secondary | ICD-10-CM | POA: Diagnosis not present

## 2017-07-10 ENCOUNTER — Ambulatory Visit (INDEPENDENT_AMBULATORY_CARE_PROVIDER_SITE_OTHER): Payer: Medicare HMO

## 2017-07-10 ENCOUNTER — Telehealth: Payer: Self-pay | Admitting: Urology

## 2017-07-10 VITALS — BP 117/66 | HR 76 | Ht 68.0 in | Wt 120.0 lb

## 2017-07-10 DIAGNOSIS — R339 Retention of urine, unspecified: Secondary | ICD-10-CM

## 2017-07-10 LAB — URINALYSIS, COMPLETE
BILIRUBIN UA: NEGATIVE
GLUCOSE, UA: NEGATIVE
Nitrite, UA: POSITIVE — AB
Specific Gravity, UA: 1.025 (ref 1.005–1.030)
UUROB: 0.2 mg/dL (ref 0.2–1.0)
pH, UA: 6.5 (ref 5.0–7.5)

## 2017-07-10 LAB — MICROSCOPIC EXAMINATION

## 2017-07-10 LAB — BLADDER SCAN AMB NON-IMAGING

## 2017-07-10 MED ORDER — CIPROFLOXACIN HCL 500 MG PO TABS: 500.0000 mg | ORAL_TABLET | Freq: Two times a day (BID) | ORAL | 0 refills | Status: DC

## 2017-07-10 NOTE — Progress Notes (Signed)
Patient present today complaining of dysuria and feeling like he cannot empty. Symptoms having been going on for some time. UA today is frankly positive for infection per Dr. Matilde Sprang patient started on Cipro 500mg  BID for 7 days and urine sent for culture will call with results

## 2017-07-10 NOTE — Telephone Encounter (Signed)
Patient called and stated that he has been having a hard time urinating and when he does not a lot comes out. I advised him to come in and we would do a PVR.  [name]

## 2017-07-12 LAB — CULTURE, URINE COMPREHENSIVE

## 2017-07-13 ENCOUNTER — Telehealth: Payer: Self-pay | Admitting: Family Medicine

## 2017-07-13 MED ORDER — NITROFURANTOIN MACROCRYSTAL 100 MG PO CAPS: 100.0000 mg | ORAL_CAPSULE | Freq: Two times a day (BID) | ORAL | 0 refills | Status: DC

## 2017-07-13 NOTE — Telephone Encounter (Signed)
-----   Message from Bjorn Loser, MD sent at 07/13/2017 11:55 AM EDT ----- Macrodantin 100 mg bid for 7 days  ----- Message ----- From: Kyra Manges, CMA Sent: 07/13/2017   8:09 AM To: Bjorn Loser, MD    ----- Message ----- From: Interface, [facility] Lab Results In Sent: 07/10/2017  11:36 AM To: Rowe Robert Clinical

## 2017-07-24 ENCOUNTER — Other Ambulatory Visit: Payer: Self-pay | Admitting: Family Medicine

## 2017-07-24 ENCOUNTER — Ambulatory Visit (INDEPENDENT_AMBULATORY_CARE_PROVIDER_SITE_OTHER): Payer: Medicare HMO | Admitting: Family Medicine

## 2017-07-24 ENCOUNTER — Encounter: Payer: Self-pay | Admitting: Family Medicine

## 2017-07-24 VITALS — BP 100/60 | HR 65 | Resp 16 | Ht 68.0 in | Wt 121.4 lb

## 2017-07-24 DIAGNOSIS — E440 Moderate protein-calorie malnutrition: Secondary | ICD-10-CM | POA: Diagnosis not present

## 2017-07-24 DIAGNOSIS — I69354 Hemiplegia and hemiparesis following cerebral infarction affecting left non-dominant side: Secondary | ICD-10-CM | POA: Diagnosis not present

## 2017-07-24 DIAGNOSIS — J449 Chronic obstructive pulmonary disease, unspecified: Secondary | ICD-10-CM | POA: Diagnosis not present

## 2017-07-24 DIAGNOSIS — D61818 Other pancytopenia: Secondary | ICD-10-CM | POA: Diagnosis not present

## 2017-07-24 DIAGNOSIS — C3491 Malignant neoplasm of unspecified part of right bronchus or lung: Secondary | ICD-10-CM

## 2017-07-24 DIAGNOSIS — F5104 Psychophysiologic insomnia: Secondary | ICD-10-CM

## 2017-07-24 DIAGNOSIS — C61 Malignant neoplasm of prostate: Secondary | ICD-10-CM

## 2017-07-24 MED ORDER — FLUTICASONE-UMECLIDIN-VILANT 100-62.5-25 MCG/INH IN AEPB: 1.0000 | INHALATION_SPRAY | Freq: Every day | RESPIRATORY_TRACT | 5 refills | Status: DC

## 2017-07-24 NOTE — Progress Notes (Signed)
Name: Collin Gonzales   MRN: [id number]    DOB: November 29, 1935   Date:07/24/2017       Progress Note  Subjective  Chief Complaint  Chief Complaint  Patient presents with  . COPD  . Gastroesophageal Reflux  . Arthritis    HPI   Malnutrition: his weight has gone down from 140 lbs 04/2015 down to 112 lbs , stable since last visit . He has poor appetite, eating two meals a day. Discussed a high calorie diet with him today. He has temporal waisting   COPD/emphysema: history of CAP back 02/2017, he is doing better now, back to baseline cough and SOB, he is oxygen dependent. He denies wheezing. He was treated for right lung cancer, treated by Dr. Baruch Gouty with radiation, still being monitored.   Atherosclerosis of aorta and coronary vessel disease: found on CT scan done 07/2016. He is on statin therapy and takes aspirin sometimes. Discussed cardiovascular risk. He also has a history of CVA, with weakness of left forefoot, but he denies any gait instability. He has not been taking aspirin, but has been compliant with statin therapy   Prostate Cancer: history of prostate seed implants, he has recurrent UTIs and recent urinary retention, under the care of Dr. Pilar Jarvis  GERD: he is not sure if he is taking omeprazole, denies heart burn or indigestion, trying to follow a GERD diet, he was advised to have EGD in 2017 and refused  RA: seeing Dr. Jefm Bryant, takes methothrexate once a week, down to 7 pills per week,  denies joint pains or swelling  Chronic insomnia: he is on Trazodone for years, he states he watches TV until late at night, takes about 15-20 minutes for him to fall asleep after taking Trazodone, no side effects.   Pancytopenia: also long history of iron deficiency anemia, refuses to have EGD and / or colonoscopy. Seen by Dr. [name] and Dr. Vicente Males in the past  COPD: used to see Dr Raul Del but not recently, he uses nasal canula oxygen, he has a productive cough in am's no wheezing but  has intermittent SOB. He smoked for about 45 years. Quit 4 years ago   Patient Active Problem List   Diagnosis Date Noted  . Dependence on supplemental oxygen 04/24/2017  . Malnutrition ([place]) 04/24/2017  . Hemiparesis affecting left side as late effect of cerebrovascular accident (CVA) ([place]) 04/24/2017  . Cataract 09/06/2016  . Drug-induced folate deficiency anemia 09/06/2016  . Esophagitis, reflux 09/06/2016  . Hypogammaglobulinemia ([place]) 09/06/2016  . Iron deficiency anemia due to chronic blood loss 08/23/2016  . Pancytopenia ([place]) 08/22/2016  . History of stroke with residual effects 06/06/2016  . Decrease in appetite 09/01/2015  . GERD (gastroesophageal reflux disease) 08/05/2015  . COPD (chronic obstructive pulmonary disease) ([place]) 08/20/2014  . Dyslipidemia 08/20/2014  . Chronic insomnia 08/20/2014  . Lung cancer ([place]) 08/20/2014  . Arthritis or polyarthritis, rheumatoid ([place]) 08/20/2014  . Prostate cancer ([place]) 11/01/2013  . Flutter-fibrillation 03/18/2005    Past Surgical History:  Procedure Laterality Date  . APPENDECTOMY  1965  . CATARACT EXTRACTION W/PHACO Right 01/05/2016   Procedure: CATARACT EXTRACTION PHACO AND INTRAOCULAR LENS PLACEMENT (IOC);  Surgeon: [name], MD;  Location: [facility] ORS;  Service: Ophthalmology;  Laterality: Right;  [place] 1.14 AP% 18.1 CDE 13.44 FLUID PACK LOT # [id number]  . ENDOBRONCHIAL ULTRASOUND N/A 02/26/2015   Procedure: ENDOBRONCHIAL ULTRASOUND;  Surgeon: Flora Lipps, MD;  Location: [facility] ORS;  Service: Cardiopulmonary;  Laterality: N/A;  . HERNIA REPAIR  1965    Family History  Problem Relation Age of Onset  . Cancer Mother   . Cancer Father        Lung Cancer  . Cancer Sister   . Cancer Brother     Social History   Socioeconomic History  . Marital status: Single    Spouse name: Not on file  . Number of children: 5  . Years of education: Not on file  . Highest education level: Not on file  Occupational History  .  Occupation: Retired  Scientific laboratory technician  . Financial resource strain: Not very hard  . Food insecurity:    Worry: Never true    Inability: Never true  . Transportation needs:    Medical: No    Non-medical: No  Tobacco Use  . Smoking status: Former Smoker    Last attempt to quit: 02/21/2013    Years since quitting: 4.4  . Smokeless tobacco: Never Used  Substance and Sexual Activity  . Alcohol use: No    Alcohol/week: 0.0 oz  . Drug use: No  . Sexual activity: Never  Lifestyle  . Physical activity:    Days per week: 0 days    Minutes per session: 0 min  . Stress: Not at all  Relationships  . Social connections:    Talks on phone: More than three times a week    Gets together: More than three times a week    Attends religious service: More than 4 times per year    Active member of club or organization: No    Attends meetings of clubs or organizations: Never    Relationship status: Divorced  . Intimate partner violence:    Fear of current or ex partner: No    Emotionally abused: No    Physically abused: No    Forced sexual activity: No  Other Topics Concern  . Not on file  Social History Narrative  . Not on file     Current Outpatient Medications:  .  methotrexate (RHEUMATREX) 2.5 MG tablet, Take 7 tablets by mouth once a week., Disp: , Rfl:  .  albuterol (PROAIR HFA) 108 (90 Base) MCG/ACT inhaler, Inhale 2 puffs into the lungs every 6 (six) hours as needed for wheezing., Disp: 3 Inhaler, Rfl: 2 .  atorvastatin (LIPITOR) 10 MG tablet, Take 1 tablet (10 mg total) by mouth daily at 6 PM., Disp: 90 tablet, Rfl: 1 .  B Complex-C (B-COMPLEX WITH VITAMIN C) tablet, Take 1 tablet by mouth daily., Disp: , Rfl:  .  finasteride (PROSCAR) 5 MG tablet, Take 1 tablet (5 mg total) by mouth daily., Disp: 30 tablet, Rfl: 11 .  Fluticasone-Umeclidin-Vilant (TRELEGY ELLIPTA) 100-62.5-25 MCG/INH AEPB, Inhale 1 puff into the lungs daily. In place of Advair and Spiriva, Disp: 60 each, Rfl: 5 .   folic acid (FOLVITE) 1 MG tablet, Take 1 tablet (1 mg total) by mouth daily., Disp: 30 tablet, Rfl: 5 .  omeprazole (PRILOSEC) 40 MG capsule, Take 1 capsule (40 mg total) by mouth daily. (Patient not taking: Reported on 04/24/2017), Disp: 90 capsule, Rfl: 1 .  OXYGEN, Place 2 L into the nose daily., Disp: , Rfl:  .  tamsulosin (FLOMAX) 0.4 MG CAPS capsule, Take 1 capsule (0.4 mg total) by mouth daily., Disp: 30 capsule, Rfl: 11 .  traZODone (DESYREL) 100 MG tablet, Take 1 tablet (100 mg total) by mouth at bedtime., Disp: 90 tablet, Rfl: 1  Allergies  Allergen Reactions  . Plaquenil [Hydroxychloroquine]   .  Simvastatin Other (See Comments)    Bad dreams     ROS  .Constitutional: Negative for fever or weight change.  Respiratory: Positive  for cough and shortness of breath.   Cardiovascular: Negative for chest pain or palpitations.  Gastrointestinal: Negative for abdominal pain, no bowel changes.  Musculoskeletal: Positive  for gait problem ( but stable) but no  joint swelling.  Skin: Negative for rash.  Neurological: Negative for dizziness or headache.  No other specific complaints in a complete review of systems (except as listed in HPI above).  Objective  Vitals:   07/24/17 1311  BP: 100/60  Pulse: 65  Resp: 16  SpO2: 96%  Weight: 121 lb 6.4 oz (55.1 kg)  Height: 5\' 8"  (1.727 m)    Body mass index is 18.46 kg/m.  Physical Exam  Constitutional: Patient appears well-developed and very thin with temporal waisting No distress.  HEENT: head atraumatic, normocephalic, pupils equal and reactive to light,  Using nasal canula oxygen neck supple, throat within normal limits Cardiovascular: Normal rate, regular rhythm and normal heart sounds.  No murmur heard. No BLE edema. Pulmonary/Chest: Effort normal and breath sounds normal. No respiratory distress. Abdominal: Soft.  There is no tenderness. Psychiatric: Patient has a normal mood and affect. behavior is normal. Judgment and  thought content normal.  Recent Results (from the past 2160 hour(s))  Urinalysis, Complete     Status: Abnormal   Collection Time: 07/10/17  9:27 AM  Result Value Ref Range   Specific Gravity, UA 1.025 1.005 - 1.030   pH, UA 6.5 5.0 - 7.5   Color, UA Yellow Yellow   Appearance Ur Cloudy (A) Clear   Leukocytes, UA 1+ (A) Negative   Protein, UA 2+ (A) Negative/Trace   Glucose, UA Negative Negative   Ketones, UA Trace (A) Negative   RBC, UA 2+ (A) Negative   Bilirubin, UA Negative Negative   Urobilinogen, Ur 0.2 0.2 - 1.0 mg/dL   Nitrite, UA Positive (A) Negative   Microscopic Examination See below:   Microscopic Examination     Status: Abnormal   Collection Time: 07/10/17  9:27 AM  Result Value Ref Range   WBC, UA 11-30 (A) 0 - 5 /hpf   RBC, UA 3-10 (A) 0 - 2 /hpf   Epithelial Cells (non renal) 0-10 0 - 10 /hpf   Mucus, UA Present (A) Not Estab.   Bacteria, UA Many (A) None seen/Few  BLADDER SCAN AMB NON-IMAGING     Status: None   Collection Time: 07/10/17  9:34 AM  Result Value Ref Range   Scan Result 172ml   CULTURE, URINE COMPREHENSIVE     Status: Abnormal   Collection Time: 07/10/17 10:17 AM  Result Value Ref Range   Urine Culture, Comprehensive Final report (A)    Organism ID, Bacteria Escherichia coli (A)     Comment: Greater than 100,000 colony forming units per mL   ANTIMICROBIAL SUSCEPTIBILITY Comment     Comment:       ** S = Susceptible; I = Intermediate; R = Resistant **                    P = Positive; N = Negative             MICS are expressed in micrograms per mL    Antibiotic                 RSLT#1    RSLT#2    RSLT#3  RSLT#4 Amoxicillin/Clavulanic Acid    S Ampicillin                     R Cefepime                       S Ceftriaxone                    S Cefuroxime                     I Ciprofloxacin                  R Ertapenem                      S Gentamicin                     S Imipenem                       S Levofloxacin                    R Meropenem                      S Nitrofurantoin                 S Piperacillin/Tazobactam        S Tetracycline                   S Tobramycin                     S Trimethoprim/Sulfa             R       PHQ2/9: Depression screen [facility] 2/9 04/24/2017 03/21/2017 01/03/2017 11/01/2016 08/01/2016  Decreased Interest 0 0 0 0 0  Down, Depressed, Hopeless 0 0 0 0 0  PHQ - 2 Score 0 0 0 0 0     Fall Risk: Fall Risk  07/24/2017 04/24/2017 03/21/2017 01/03/2017 11/01/2016  Falls in the past year? No No No No No  Number falls in past yr: - - - - -  Injury with Fall? - - - - -     Functional Status Survey: Is the patient deaf or have difficulty hearing?: No Does the patient have difficulty seeing, even when wearing glasses/contacts?: No Does the patient have difficulty concentrating, remembering, or making decisions?: No Does the patient have difficulty walking or climbing stairs?: No Does the patient have difficulty dressing or bathing?: No Does the patient have difficulty doing errands alone such as visiting a doctor's office or shopping?: No    Assessment & Plan  1. Chronic obstructive pulmonary disease, unspecified COPD type ([place])  We will combine Spiriva and Advair into Trelegy to decrease cost  - Fluticasone-Umeclidin-Vilant (TRELEGY ELLIPTA) 100-62.5-25 MCG/INH AEPB; Inhale 1 puff into the lungs daily. In place of Advair and Spiriva  Dispense: 60 each; Refill: 5  2. Prostate cancer ([place])  Sees Dr. Su Grand  - COMPLETE METABOLIC PANEL WITH GFR  3. Pancytopenia ([facility])  Recheck levels, seen by Dr. [name] in the past, also seen by Dr. Vicente Males in 2017 but refused EGD and colonoscopy  - CBC with Differential/Platelet  4. Moderate protein-calorie malnutrition ([facility])  Weight is stable  5. Hemiparesis affecting left side as late effect of cerebrovascular accident (CVA) ([place])  Not on aspirin because of non-compliance , however has a history of CVA, anemia, that is unknown etiology and  is 82 yo, he wants to stay off medication at this time  6. Primary lung cancer, right [facility])  Seeing Dr. Donella Stade, finished radiation and is doing well

## 2017-07-27 ENCOUNTER — Ambulatory Visit
Admission: RE | Admit: 2017-07-27 | Discharge: 2017-07-27 | Disposition: A | Discharge status: Home / Self Care | Payer: Medicare HMO | Source: Ambulatory Visit | Attending: Radiation Oncology | Admitting: Radiation Oncology

## 2017-07-27 DIAGNOSIS — J439 Emphysema, unspecified: Secondary | ICD-10-CM | POA: Insufficient documentation

## 2017-07-27 DIAGNOSIS — I700 Atherosclerosis of aorta: Secondary | ICD-10-CM | POA: Insufficient documentation

## 2017-07-27 DIAGNOSIS — R918 Other nonspecific abnormal finding of lung field: Secondary | ICD-10-CM

## 2017-07-27 HISTORY — DX: Malignant neoplasm of prostate: C61

## 2017-07-27 LAB — POCT I-STAT CREATININE: Creatinine, Ser: 1.2 mg/dL (ref 0.61–1.24)

## 2017-07-27 MED ORDER — IOPAMIDOL (ISOVUE-300) INJECTION 61%
75.0000 mL | Freq: Once | INTRAVENOUS | Status: AC | PRN
  Administered 2017-07-27: 75 mL via INTRAVENOUS

## 2017-08-03 ENCOUNTER — Other Ambulatory Visit: Payer: Self-pay | Admitting: Family Medicine

## 2017-08-03 DIAGNOSIS — F5104 Psychophysiologic insomnia: Secondary | ICD-10-CM

## 2017-08-04 ENCOUNTER — Telehealth: Payer: Self-pay

## 2017-08-04 ENCOUNTER — Encounter: Payer: Self-pay | Admitting: *Deleted

## 2017-08-04 NOTE — Telephone Encounter (Signed)
Scheduled for AWV 08/08/17 @ 9am

## 2017-08-08 ENCOUNTER — Ambulatory Visit (INDEPENDENT_AMBULATORY_CARE_PROVIDER_SITE_OTHER): Payer: Medicare HMO

## 2017-08-08 VITALS — BP 100/60 | HR 64 | Temp 97.9°F | Resp 12 | Ht 68.0 in | Wt 120.2 lb

## 2017-08-08 DIAGNOSIS — Z974 Presence of external hearing-aid: Secondary | ICD-10-CM

## 2017-08-08 DIAGNOSIS — Z9181 History of falling: Secondary | ICD-10-CM

## 2017-08-08 DIAGNOSIS — Z0000 Encounter for general adult medical examination without abnormal findings: Secondary | ICD-10-CM

## 2017-08-08 NOTE — Patient Instructions (Addendum)
Collin Gonzales , Thank you for taking time to come for your Medicare Wellness Visit. I appreciate your ongoing commitment to your health goals. Please review the following plan we discussed and let me know if I can assist you in the future.   Screening recommendations/referrals: Colorectal Screening: No longer required Lung Cancer Screening: You do not qualify for this screening Hepatitis C Screening: You do not qualify for this screening  Vision and Dental Exams: Recommended annual ophthalmology exams for early detection of glaucoma and other disorders of the eye Recommended annual dental exams for proper oral hygiene  Vaccinations: Influenza vaccine: Up to date Pneumococcal vaccine: Up to date Tdap vaccine: Declined. Please call your insurance company to determine your out of pocket expense. You may also receive this vaccine at your local pharmacy or Health Dept. Shingles vaccine: Please call your insurance company to determine your out of pocket expense for the Shingrix vaccine. You may also receive this vaccine at your local pharmacy or Health Dept.   Advanced directives: Advance directive discussed with you today. I have provided a copy for you to complete at home and have notarized. Once this is complete please bring a copy in to our office so we can scan it into your chart.  Goals: Recommend to drink at least 1-2 protein shakes per day  Next appointment: Please schedule your Annual Wellness Visit with your Nurse Health Advisor in one year.  Preventive Care 25 Years and Older, Male Preventive care refers to lifestyle choices and visits with your health care provider that can promote health and wellness. What does preventive care include?  A yearly physical exam. This is also called an annual well check.  Dental exams once or twice a year.  Routine eye exams. Ask your health care provider how often you should have your eyes checked.  Personal lifestyle choices,  including:  Daily care of your teeth and gums.  Regular physical activity.  Eating a healthy diet.  Avoiding tobacco and drug use.  Limiting alcohol use.  Practicing safe sex.  Taking low doses of aspirin every day.  Taking vitamin and mineral supplements as recommended by your health care provider. What happens during an annual well check? The services and screenings done by your health care provider during your annual well check will depend on your age, overall health, lifestyle risk factors, and family history of disease. Counseling  Your health care provider may ask you questions about your:  Alcohol use.  Tobacco use.  Drug use.  Emotional well-being.  Home and relationship well-being.  Sexual activity.  Eating habits.  History of falls.  Memory and ability to understand (cognition).  Work and work Statistician. Screening  You may have the following tests or measurements:  Height, weight, and BMI.  Blood pressure.  Lipid and cholesterol levels. These may be checked every 5 years, or more frequently if you are over 41 years old.  Skin check.  Lung cancer screening. You may have this screening every year starting at age 35 if you have a 30-pack-year history of smoking and currently smoke or have quit within the past 15 years.  Fecal occult blood test (FOBT) of the stool. You may have this test every year starting at age 2.  Flexible sigmoidoscopy or colonoscopy. You may have a sigmoidoscopy every 5 years or a colonoscopy every 10 years starting at age 75.  Prostate cancer screening. Recommendations will vary depending on your family history and other risks.  Hepatitis C blood test.  Hepatitis B blood test.  Sexually transmitted disease (STD) testing.  Diabetes screening. This is done by checking your blood sugar (glucose) after you have not eaten for a while (fasting). You may have this done every 1-3 years.  Abdominal aortic aneurysm (AAA)  screening. You may need this if you are a current or former smoker.  Osteoporosis. You may be screened starting at age 38 if you are at high risk. Talk with your health care provider about your test results, treatment options, and if necessary, the need for more tests. Vaccines  Your health care provider may recommend certain vaccines, such as:  Influenza vaccine. This is recommended every year.  Tetanus, diphtheria, and acellular pertussis (Tdap, Td) vaccine. You may need a Td booster every 10 years.  Zoster vaccine. You may need this after age 17.  Pneumococcal 13-valent conjugate (PCV13) vaccine. One dose is recommended after age 52.  Pneumococcal polysaccharide (PPSV23) vaccine. One dose is recommended after age 48. Talk to your health care provider about which screenings and vaccines you need and how often you need them. This information is not intended to replace advice given to you by your health care provider. Make sure you discuss any questions you have with your health care provider. Document Released: 03/06/2015 Document Revised: 10/28/2015 Document Reviewed: 12/09/2014 Elsevier Interactive Patient Education  2017 [place] Prevention in the Home Falls can cause injuries. They can happen to people of all ages. There are many things you can do to make your home safe and to help prevent falls. What can I do on the outside of my home?  Regularly fix the edges of walkways and driveways and fix any cracks.  Remove anything that might make you trip as you walk through a door, such as a raised step or threshold.  Trim any bushes or trees on the path to your home.  Use bright outdoor lighting.  Clear any walking paths of anything that might make someone trip, such as rocks or tools.  Regularly check to see if handrails are loose or broken. Make sure that both sides of any steps have handrails.  Any raised decks and porches should have guardrails on the  edges.  Have any leaves, snow, or ice cleared regularly.  Use sand or salt on walking paths during winter.  Clean up any spills in your garage right away. This includes oil or grease spills. What can I do in the bathroom?  Use night lights.  Install grab bars by the toilet and in the tub and shower. Do not use towel bars as grab bars.  Use non-skid mats or decals in the tub or shower.  If you need to sit down in the shower, use a plastic, non-slip stool.  Keep the floor dry. Clean up any water that spills on the floor as soon as it happens.  Remove soap buildup in the tub or shower regularly.  Attach bath mats securely with double-sided non-slip rug tape.  Do not have throw rugs and other things on the floor that can make you trip. What can I do in the bedroom?  Use night lights.  Make sure that you have a light by your bed that is easy to reach.  Do not use any sheets or blankets that are too big for your bed. They should not hang down onto the floor.  Have a firm chair that has side arms. You can use this for support while you get dressed.  Do not  have throw rugs and other things on the floor that can make you trip. What can I do in the kitchen?  Clean up any spills right away.  Avoid walking on wet floors.  Keep items that you use a lot in easy-to-reach places.  If you need to reach something above you, use a strong step stool that has a grab bar.  Keep electrical cords out of the way.  Do not use floor polish or wax that makes floors slippery. If you must use wax, use non-skid floor wax.  Do not have throw rugs and other things on the floor that can make you trip. What can I do with my stairs?  Do not leave any items on the stairs.  Make sure that there are handrails on both sides of the stairs and use them. Fix handrails that are broken or loose. Make sure that handrails are as long as the stairways.  Check any carpeting to make sure that it is firmly  attached to the stairs. Fix any carpet that is loose or worn.  Avoid having throw rugs at the top or bottom of the stairs. If you do have throw rugs, attach them to the floor with carpet tape.  Make sure that you have a light switch at the top of the stairs and the bottom of the stairs. If you do not have them, ask someone to add them for you. What else can I do to help prevent falls?  Wear shoes that:  Do not have high heels.  Have rubber bottoms.  Are comfortable and fit you well.  Are closed at the toe. Do not wear sandals.  If you use a stepladder:  Make sure that it is fully opened. Do not climb a closed stepladder.  Make sure that both sides of the stepladder are locked into place.  Ask someone to hold it for you, if possible.  Clearly mark and make sure that you can see:  Any grab bars or handrails.  First and last steps.  Where the edge of each step is.  Use tools that help you move around (mobility aids) if they are needed. These include:  Canes.  Walkers.  Scooters.  Crutches.  Turn on the lights when you go into a dark area. Replace any light bulbs as soon as they burn out.  Set up your furniture so you have a clear path. Avoid moving your furniture around.  If any of your floors are uneven, fix them.  If there are any pets around you, be aware of where they are.  Review your medicines with your doctor. Some medicines can make you feel dizzy. This can increase your chance of falling. Ask your doctor what other things that you can do to help prevent falls. This information is not intended to replace advice given to you by your health care provider. Make sure you discuss any questions you have with your health care provider. Document Released: 12/04/2008 Document Revised: 07/16/2015 Document Reviewed: 03/14/2014 Elsevier Interactive Patient Education  2017 [employer].

## 2017-08-08 NOTE — Progress Notes (Addendum)
Subjective:   Collin Gonzales is a 82 y.o. male who presents for Medicare Annual/Subsequent preventive examination.  Review of Systems:  N/A Cardiac Risk Factors include: advanced age (>48men, >40 women);dyslipidemia;sedentary lifestyle;male gender     Objective:    Vitals: BP 100/60 (BP Location: Left Arm, Patient Position: Sitting, Cuff Size: Normal)   Pulse 64   Temp 97.9 F (36.6 C) (Oral)   Resp 12   Ht 5\' 8"  (1.727 m)   Wt 120 lb 3.2 oz (54.5 kg)   SpO2 96%   BMI 18.28 kg/m   Body mass index is 18.28 kg/m.  Advanced Directives 08/08/2017 03/22/2017 03/01/2017 03/01/2017 01/19/2017 01/03/2017 11/22/2016  Does Patient Have a Medical Advance Directive? No No No No No No No  Would patient like information on creating a medical advance directive? Yes (MAU/Ambulatory/Procedural Areas - Information given) No - Patient declined No - Patient declined - - Yes (MAU/Ambulatory/Procedural Areas - Information given) No - Patient declined    Tobacco Social History   Tobacco Use  Smoking Status Former Smoker  . Packs/day: 0.50  . Years: 50.00  . Pack years: 25.00  . Types: Cigarettes  . Last attempt to quit: 02/21/2013  . Years since quitting: 4.4  Smokeless Tobacco Never Used  Tobacco Comment   smoking cessation materials not required     Counseling given: No Comment: smoking cessation materials not required  Clinical Intake:  Pre-visit preparation completed: Yes  Pain : No/denies pain   BMI - recorded: 18.28 Nutritional Status: BMI <19  Underweight Nutritional Risks: None Diabetes: No  How often do you need to have someone help you when you read instructions, pamphlets, or other written materials from your doctor or pharmacy?: 1 - Never  Interpreter Needed?: No  Information entered by :: AEversole, LPN  Past Medical History:  Diagnosis Date  . Collagen vascular disease (HCC)    rheumatoid arthritis  . COPD (chronic obstructive pulmonary disease) ([place])   . GERD  (gastroesophageal reflux disease)   . History of prostate cancer   . HOH (hard of hearing)   . Insomnia   . Lung cancer ([place]) 2017   rad tx's.  . Oxygen deficit    2L HS AND PRN  . Prostate cancer ([place]) 2006   Rad seed tx's.  . Stroke ([place])    2000   Past Surgical History:  Procedure Laterality Date  . APPENDECTOMY  1965  . CATARACT EXTRACTION W/PHACO Right 01/05/2016   Procedure: CATARACT EXTRACTION PHACO AND INTRAOCULAR LENS PLACEMENT (IOC);  Surgeon: [name], MD;  Location: [facility] ORS;  Service: Ophthalmology;  Laterality: Right;  [place] 1.14 AP% 18.1 CDE 13.44 FLUID PACK LOT # [id number] H  . ENDOBRONCHIAL ULTRASOUND N/A 02/26/2015   Procedure: ENDOBRONCHIAL ULTRASOUND;  Surgeon: Flora Lipps, MD;  Location: [facility] ORS;  Service: Cardiopulmonary;  Laterality: N/A;  . HERNIA REPAIR  1965   Family History  Problem Relation Age of Onset  . Cancer Mother   . Cancer Father        Lung Cancer  . Cancer Sister        breast  . Cancer Brother        stomach   Social History   Socioeconomic History  . Marital status: Divorced    Spouse name: Not on file  . Number of children: 5  . Years of education: Not on file  . Highest education level: 12th grade  Occupational History  . Occupation: Retired  Scientific laboratory technician  .  Financial resource strain: Not hard at all  . Food insecurity:    Worry: Never true    Inability: Never true  . Transportation needs:    Medical: No    Non-medical: No  Tobacco Use  . Smoking status: Former Smoker    Packs/day: 0.50    Years: 50.00    Pack years: 25.00    Types: Cigarettes    Last attempt to quit: 02/21/2013    Years since quitting: 4.4  . Smokeless tobacco: Never Used  . Tobacco comment: smoking cessation materials not required  Substance and Sexual Activity  . Alcohol use: No    Alcohol/week: 0.0 oz  . Drug use: No  . Sexual activity: Never  Lifestyle  . Physical activity:    Days per week: 0 days    Minutes per session: 0 min  .  Stress: Not at all  Relationships  . Social connections:    Talks on phone: Patient refused    Gets together: Patient refused    Attends religious service: Patient refused    Active member of club or organization: Patient refused    Attends meetings of clubs or organizations: Patient refused    Relationship status: Divorced  Other Topics Concern  . Not on file  Social History Narrative  . Not on file    Outpatient Encounter Medications as of 08/08/2017  Medication Sig  . albuterol (PROAIR HFA) 108 (90 Base) MCG/ACT inhaler Inhale 2 puffs into the lungs every 6 (six) hours as needed for wheezing.  Marland Kitchen atorvastatin (LIPITOR) 10 MG tablet Take 1 tablet (10 mg total) by mouth daily at 6 PM.  . B Complex-C (B-COMPLEX WITH VITAMIN C) tablet Take 1 tablet by mouth daily.  . finasteride (PROSCAR) 5 MG tablet Take 1 tablet (5 mg total) by mouth daily.  . Fluticasone-Umeclidin-Vilant (TRELEGY ELLIPTA) 100-62.5-25 MCG/INH AEPB Inhale 1 puff into the lungs daily. In place of Advair and Spiriva  . folic acid (FOLVITE) 1 MG tablet Take 1 tablet (1 mg total) by mouth daily.  . methotrexate (RHEUMATREX) 2.5 MG tablet Take 7 tablets by mouth once a week.  Marland Kitchen omeprazole (PRILOSEC) 40 MG capsule Take 1 capsule (40 mg total) by mouth daily.  . OXYGEN Place 2 L into the nose daily.  . tamsulosin (FLOMAX) 0.4 MG CAPS capsule Take 1 capsule (0.4 mg total) by mouth daily.  . traZODone (DESYREL) 100 MG tablet TAKE 1 TABLET BY MOUTH EVERYDAY AT BEDTIME   No facility-administered encounter medications on file as of 08/08/2017.     Activities of Daily Living In your present state of health, do you have any difficulty performing the following activities: 08/08/2017 07/24/2017  Hearing? N N  Comment B hearing aids; lost hearing aids -  Vision? N N  Comment wears eyeglasses -  Difficulty concentrating or making decisions? N N  Walking or climbing stairs? Y N  Comment dyspnea -  Dressing or bathing? N N  Doing  errands, shopping? N N  Preparing Food and eating ? N -  Comment upper dentures -  Using the Toilet? N -  In the past six months, have you accidently leaked urine? Y -  Comment occ leakage -  Do you have problems with loss of bowel control? N -  Managing your Medications? N -  Managing your Finances? N -  Housekeeping or managing your Housekeeping? N -  Some recent data might be hidden   C3 referral placed d/t pt losing his hearing  aids during his most recent hospital admission. States insurance will not cover for replacement hearing aids.  Patient Care Team: [name], MD as PCP - General (Family Medicine) [name], MD as Referring Physician (Ophthalmology) [name], MD as Consulting Physician (Rheumatology) [name], MD as Referring Physician (Specialist) [name], MD as Consulting Physician (Urology) [name], MD as Referring Physician (Radiation Oncology)   Assessment:   This is a routine wellness examination for [place].  Exercise Activities and Dietary recommendations Current Exercise Habits: The patient does not participate in regular exercise at present, Exercise limited by: respiratory conditions(s)(COPD, lung cancer)  Goals    . DIET - INCREASE PROTEIN INTAKE     Recommend to drink 1-2 protein shakes per day       Fall Risk Fall Risk  08/08/2017 07/24/2017 04/24/2017 03/21/2017 01/03/2017  Falls in the past year? No No No No No  Number falls in past yr: - - - - -  Injury with Fall? - - - - -  Risk for fall due to : Impaired vision;Other (Comment);Medication side effect;Impaired mobility - - - -  Risk for fall due to: Comment wears eyeglasses; use of O2, weakness, dyspnea - - - -   FALL RISK PREVENTION PERTAINING TO HOME: Is your home free of loose throw rugs in walkways, pet beds, electrical cords, etc? Yes Is there adequate lighting in your home to reduce risk of falls?  Yes Are there stairs in or around your  home WITH handrails? No  ASSISTIVE DEVICES UTILIZED TO PREVENT FALLS: Use of a cane, walker or w/c? No Grab bars in the bathroom? Yes  Shower chair or a place to sit while bathing? No An elevated toilet seat or a handicapped toilet? No, requesting handicapped toilet  Timed Get Up and Go Performed: Yes. Pt ambulated 10 feet within 22 sec. Gait slow, steady and without the use of an assistive device. However, pt does carry an O2 tank during ambulation. Does not appear to hinder his balance or mobility. No intervention required at this time. Fall risk prevention has been discussed.  Community Resource Referral:  Data processing manager Referral sent to [facility] Guide for a handicapped toilet. Pt declined my offer to send Community Resource Referral to [facility] Guide for shower chair.  Depression Screen PHQ 2/9 Scores 08/08/2017 04/24/2017 03/21/2017 01/03/2017  PHQ - 2 Score 0 0 0 0  PHQ- 9 Score 0 - - -    Cognitive Function     6CIT Screen 08/08/2017 01/03/2017 01/11/2016  What Year? 0 points 0 points 0 points  What month? 0 points - 0 points  What time? 0 points 0 points 0 points  Count back from 20 0 points 0 points 0 points  Months in reverse 0 points 0 points 0 points  Repeat phrase 0 points 0 points 4 points  Total Score 0 - 4    Immunization History  Administered Date(s) Administered  . Influenza, High Dose Seasonal PF 11/21/2014, 01/11/2016, 01/03/2017  . Influenza, Seasonal, Injecte, Preservative Fre 11/08/2010  . Influenza,inj,Quad PF,6+ Mos 12/06/2012  . Pneumococcal Conjugate-13 01/03/2017  . Pneumococcal Polysaccharide-23 12/06/2012, 03/03/2017    Qualifies for Shingles Vaccine? Yes. Due for Shingrix. Education has been provided regarding the importance of this vaccine. Pt has been advised to call his insurance company to determine his out of pocket expense. Advised he may also receive this vaccine at his local pharmacy or Health Dept. Verbalized acceptance and  understanding.  Due for  Tdap vaccine. Education has been provided regarding the importance of this vaccine. Pt has been advised he may receive this vaccine at his local pharmacy or Health Dept. Also advised to provide a copy of his vaccination record if he chooses to receive this vaccine at his local pharmacy. Verbalized acceptance and understanding.  Screening Tests Health Maintenance  Topic Date Due  . TETANUS/TDAP  01/10/2026 (Originally 12/27/1954)  . INFLUENZA VACCINE  09/21/2017  . PNA vac Low Risk Adult  Completed   Cancer Screenings: Lung: Low Dose CT Chest recommended if Age 65-80 years, 30 pack-year currently smoking OR have quit w/in 15years. Patient does not qualify. Colorectal: No longer required  Additional Screenings: Hepatitis C Screening: Does not qualify     Plan:  I have personally reviewed and addressed the Medicare Annual Wellness questionnaire and have noted the following in the patient's chart:  A. Medical and social history B. Use of alcohol, tobacco or illicit drugs  C. Current medications and supplements D. Functional ability and status E.  Nutritional status F.  Physical activity G. Advance directives H. List of other physicians I.  Hospitalizations, surgeries, and ER visits in previous 12 months J.  [place] such as hearing and vision if needed, cognitive and depression L. Referrals and appointments  In addition, I have reviewed and discussed with patient certain preventive protocols, quality metrics, and best practice recommendations. A written personalized care plan for preventive services as well as general preventive health recommendations were provided to patient.  See attached scanned questionnaire for additional information.   Signed,  Aleatha Borer, LPN Nurse Health Advisor  I have reviewed this encounter including the documentation in this note and/or discussed this patient with the provider, Aleatha Borer, LPN. I am  certifying that I agree with the content of this note as supervising physician.  [name], MD [facility] 08/08/2017, 1:00 PM

## 2017-08-10 ENCOUNTER — Ambulatory Visit: Payer: Medicare HMO | Admitting: Radiation Oncology

## 2017-08-16 ENCOUNTER — Other Ambulatory Visit: Payer: Self-pay | Admitting: *Deleted

## 2017-08-16 ENCOUNTER — Other Ambulatory Visit: Payer: Self-pay

## 2017-08-16 ENCOUNTER — Ambulatory Visit
Admission: RE | Admit: 2017-08-16 | Discharge: 2017-08-16 | Disposition: A | Discharge status: Home / Self Care | Payer: Medicare HMO | Source: Ambulatory Visit | Attending: Radiation Oncology | Admitting: Radiation Oncology

## 2017-08-16 ENCOUNTER — Encounter: Payer: Self-pay | Admitting: Radiation Oncology

## 2017-08-16 VITALS — BP 98/62 | HR 79 | Temp 94.9°F | Resp 20 | Wt 120.3 lb

## 2017-08-16 DIAGNOSIS — C3411 Malignant neoplasm of upper lobe, right bronchus or lung: Secondary | ICD-10-CM | POA: Insufficient documentation

## 2017-08-16 DIAGNOSIS — R918 Other nonspecific abnormal finding of lung field: Secondary | ICD-10-CM

## 2017-08-16 DIAGNOSIS — Z87891 Personal history of nicotine dependence: Secondary | ICD-10-CM | POA: Insufficient documentation

## 2017-08-16 DIAGNOSIS — Z923 Personal history of irradiation: Secondary | ICD-10-CM | POA: Insufficient documentation

## 2017-08-16 NOTE — Progress Notes (Signed)
Radiation Oncology Follow up Note  Name: Collin Gonzales   Date:   08/16/2017 MRN:  [id number] DOB: 21-Nov-1935    This 82 y.o. male presents to the clinic today for 2 year follow-up status post SB RT to right upper lobe for non-small cell lung cancer.  REFERRING PROVIDER: Roselee Nova, MD  HPI: patient is a 82 year old male now out 2 years having completed SB RT to his right upper lobe for small cell lung cancer. Seen today in routine follow-up he is doing fairly well. Specifically denies cough hemoptysis or chest tightness..he had a recent CT scan is month showing slight interval increase in irregular bandlike opacity posterior to right upper lobe which may resent represent evolving postradiation change which I agree with on my own personal review.  COMPLICATIONS OF TREATMENT: none  FOLLOW UP COMPLIANCE: keeps appointments   PHYSICAL EXAM:  BP 98/62   Pulse 79   Temp (!) 94.9 F (34.9 C)   Resp 20   Wt 120 lb 4.2 oz (54.5 kg)   BMI 18.29 kg/m  Thin male in NAD.Well-developed well-nourished patient in NAD. HEENT reveals PERLA, EOMI, discs not visualized.  Oral cavity is clear. No oral mucosal lesions are identified. Neck is clear without evidence of cervical or supraclavicular adenopathy. Lungs are clear to A&P. Cardiac examination is essentially unremarkable with regular rate and rhythm without murmur rub or thrill. Abdomen is benign with no organomegaly or masses noted. Motor sensory and DTR levels are equal and symmetric in the upper and lower extremities. Cranial nerves II through XII are grossly intact. Proprioception is intact. No peripheral adenopathy or edema is identified. No motor or sensory levels are noted. Crude visual fields are within normal range.  RADIOLOGY RESULTS: CT scans reviewed and compatible above-stated findings  PLAN: present time patient is doing well stable disease by CT criteria. I've asked to see him back in 1 year for follow-up with a CT prior to  that visit. Patient is to call with any concerns at any time.  I would like to take this opportunity to thank you for allowing me to participate in the care of your patient.[name], MD

## 2017-09-12 NOTE — Progress Notes (Signed)
09/13/2017 9:06 AM   [name] Jan 21, 1936 [id number]  Referring provider: [name], MD [address] [place], [place] [address]  Chief Complaint  Patient presents with  . Recurrent UTI    HPI: Patient is a 82 year old [place] American male with a history of prostate cancer, recurrent UTI's and urinary retention who presents today stating his infection from May has not cleared.  History of prostate cancer History of prostate cancer status post brachytherapy in 2006.      History of recurrent UTI's + E.coli resistant to ampicillin, ciprofloxacin, levofloxacin and trimethoprim/sulfa   Risk factors for UTI's: age, chemotherapy/radiation and urinary retention  Today, he is complaining of incomplete bladder emptying, urinary frequency, intermittency, urgency, weak urinary stream, straining to urinate nocturia 3.    Patient denies any gross hematuria, dysuria or suprapubic/flank pain.  Patient denies any fevers, chills, nausea or vomiting.   His UA today is nitrite positive, > 30 WBC's, 11-30 RBC's and many bacteria.    History of urinary retention Seen in ED and had a Foley placed for urinary retention in 12/2016.   He is currently on tamsulosin 0.4 mg and finasteride 5 mg daily.    BPH WITH LUTS  (prostate and/or bladder) IPSS score: 30/5  PVR: >410 cc  Currently taking: Finasteride 5 mg daily and tamsulosin 0.8 mg daily.    IPSS    Row Name 09/13/17 0800         International Prostate Symptom Score   How often have you had the sensation of not emptying your bladder?  About half the time     How often have you had to urinate less than every two hours?  Almost always     How often have you found you stopped and started again several times when you urinated?  More than half the time     How often have you found it difficult to postpone urination?  Almost always     How often have you had a weak urinary stream?  Almost always     How often have you  had to strain to start urination?  Almost always     How many times did you typically get up at night to urinate?  3 Times     Total IPSS Score  30       Quality of Life due to urinary symptoms   If you were to spend the rest of your life with your urinary condition just the way it is now how would you feel about that?  Unhappy        Score:  1-7 Mild 8-19 Moderate 20-35 Severe  PMH: Past Medical History:  Diagnosis Date  . Collagen vascular disease (HCC)    rheumatoid arthritis  . COPD (chronic obstructive pulmonary disease) ([place])   . GERD (gastroesophageal reflux disease)   . History of prostate cancer   . HOH (hard of hearing)   . Insomnia   . Lung cancer ([place]) 2017   rad tx's.  . Oxygen deficit    2L HS AND PRN  . Prostate cancer ([place]) 2006   Rad seed tx's.  . Stroke [facility])    2000    Surgical History: Past Surgical History:  Procedure Laterality Date  . APPENDECTOMY  1965  . CATARACT EXTRACTION W/PHACO Right 01/05/2016   Procedure: CATARACT EXTRACTION PHACO AND INTRAOCULAR LENS PLACEMENT (IOC);  Surgeon: [name], MD;  Location: [facility] ORS;  Service: Ophthalmology;  Laterality:  Right;  [place] 1.14 AP% 18.1 CDE 13.44 FLUID PACK LOT # [id number] H  . ENDOBRONCHIAL ULTRASOUND N/A 02/26/2015   Procedure: ENDOBRONCHIAL ULTRASOUND;  Surgeon: Flora Lipps, MD;  Location: [facility] ORS;  Service: Cardiopulmonary;  Laterality: N/A;  . HERNIA REPAIR  1965    Home Medications:  Allergies as of 09/13/2017      Reactions   Plaquenil [hydroxychloroquine]    Simvastatin Other (See Comments)   Bad dreams      Medication List        Accurate as of 09/13/17  9:06 AM. Always use your most recent med list.          albuterol 108 (90 Base) MCG/ACT inhaler Commonly known as:  PROAIR HFA Inhale 2 puffs into the lungs every 6 (six) hours as needed for wheezing.   atorvastatin 10 MG tablet Commonly known as:  LIPITOR Take 1 tablet (10 mg total) by mouth daily at 6 PM.     B-complex with vitamin C tablet Take 1 tablet by mouth daily.   finasteride 5 MG tablet Commonly known as:  PROSCAR Take 1 tablet (5 mg total) by mouth daily.   Fluticasone-Umeclidin-Vilant 100-62.5-25 MCG/INH Aepb Commonly known as:  TRELEGY ELLIPTA Inhale 1 puff into the lungs daily. In place of Advair and Spiriva   folic acid 1 MG tablet Commonly known as:  FOLVITE Take 1 tablet (1 mg total) by mouth daily.   methotrexate 2.5 MG tablet Commonly known as:  RHEUMATREX Take 7 tablets by mouth once a week.   omeprazole 40 MG capsule Commonly known as:  PRILOSEC Take 1 capsule (40 mg total) by mouth daily.   OXYGEN Place 2 L into the nose daily.   tamsulosin 0.4 MG Caps capsule Commonly known as:  FLOMAX Take 1 capsule (0.4 mg total) by mouth daily.   traZODone 100 MG tablet Commonly known as:  DESYREL TAKE 1 TABLET BY MOUTH EVERYDAY AT BEDTIME       Allergies:  Allergies  Allergen Reactions  . Plaquenil [Hydroxychloroquine]   . Simvastatin Other (See Comments)    Bad dreams    Family History: Family History  Problem Relation Age of Onset  . Cancer Mother   . Cancer Father        Lung Cancer  . Cancer Sister        breast  . Cancer Brother        stomach    Social History:  reports that he quit smoking about 4 years ago. His smoking use included cigarettes. He has a 25.00 pack-year smoking history. He has never used smokeless tobacco. He reports that he does not drink alcohol or use drugs.  ROS: UROLOGY Frequent Urination?: No Hard to postpone urination?: No Burning/pain with urination?: No Get up at night to urinate?: No Leakage of urine?: No Urine stream starts and stops?: No Trouble starting stream?: No Do you have to strain to urinate?: No Blood in urine?: No Urinary tract infection?: No Sexually transmitted disease?: No Injury to kidneys or bladder?: No Painful intercourse?: No Weak stream?: No Erection problems?: No Penile pain?:  No  Gastrointestinal Nausea?: No Vomiting?: No Indigestion/heartburn?: No Diarrhea?: No Constipation?: No  Constitutional Fever: No Night sweats?: No Weight loss?: No Fatigue?: No  Skin Skin rash/lesions?: No Itching?: No  Eyes Blurred vision?: No Double vision?: No  Ears/Nose/Throat Sore throat?: No Sinus problems?: No  Hematologic/Lymphatic Swollen glands?: No Easy bruising?: No  Cardiovascular Leg swelling?: No Chest pain?: No  Respiratory Cough?:  No Shortness of breath?: No  Endocrine Excessive thirst?: No  Musculoskeletal Back pain?: No Joint pain?: No  Neurological Headaches?: No Dizziness?: No  Psychologic Depression?: No Anxiety?: No  Physical Exam: BP 111/61 (BP Location: Left Arm, Patient Position: Sitting, Cuff Size: Normal)   Pulse 79   Ht 5' 8" (1.727 m)   Wt 120 lb (54.4 kg)   BMI 18.25 kg/m   Constitutional: Well nourished. Alert and oriented, No acute distress. HEENT:  AT, moist mucus membranes. Trachea midline, no masses. Cardiovascular: No clubbing, cyanosis, or edema. Respiratory: Normal respiratory effort, no increased work of breathing. GI: Abdomen is soft, non tender, non distended, no abdominal masses. Liver and spleen not palpable.  No hernias appreciated.  Stool sample for occult testing is not indicated.   GU: No CVA tenderness.  No bladder fullness or masses.  Patient with uncircumcised phallus.   Foreskin easily retracted Urethral meatus is patent.  No penile discharge. No penile lesions or rashes. Scrotum without lesions, cysts, rashes and/or edema.  Bilateral hydroceles.   Rectal: Patient with  normal sphincter tone. Anus and perineum without scarring or rashes. No rectal masses are appreciated. Prostate is approximately 60 grams, could only palpate the apex and midportion of the gland and very firm in the apex area.  Skin: No rashes, bruises or suspicious lesions. Lymph: No cervical or inguinal adenopathy.  Large  lipoma on right hip.   Neurologic: Grossly intact, no focal deficits, moving all 4 extremities. Psychiatric: Normal mood and affect.  Laboratory Data: Lab Results  Component Value Date   WBC 3.4 (L) 04/24/2017   HGB 11.0 (L) 04/24/2017   HCT 33.5 (L) 04/24/2017   MCV 92.0 04/24/2017   PLT 302 04/24/2017    Lab Results  Component Value Date   CREATININE 1.20 07/27/2017    No results found for: PSA  No results found for: TESTOSTERONE  No results found for: HGBA1C  Urinalysis See HPI and Epic.    I have reviewed the labs.  Assessment & Plan:    1. History of prostate cancer PSA drawn today - RTC pending PSA results  2. History of recurrent UTI's Criteria for recurrent UTI has not been met with 2 or more infections in 6 months or 3 or greater infections in one year  Patient is instructed to increase their water intake until the urine is pale yellow or clear (10 to 12 cups daily)  UA is suspicious for infection -will send for culture - will start Augmentin 875/125, twice daily - will adjust if necessary once sensitivities are available  3. History of urinary retention PVR is > 410 cc Patient will be instructed on CIC and will cath twice daily indefinitely Patient will be scheduled for cystoscopy  I have explained to the patient that they will  be scheduled for a cystoscopy in our office to evaluate their bladder.  The cystoscopy consists of passing a tube with a lens up through their urethra and into their urinary bladder.   We will inject the urethra with a lidocaine gel prior to introducing the cystoscope to help with any discomfort during the procedure.   After the procedure, they might experience blood in the urine and discomfort with urination.  This will abate after the first few voids.  I have  encouraged the patient to increase water intake  during this time.  Patient denies any allergies to lidocaine.  Return for cystoscopy  for BOO .  [name], PA-C  [facility] [address] [place], [place] [address] [phone]

## 2017-09-13 ENCOUNTER — Ambulatory Visit (INDEPENDENT_AMBULATORY_CARE_PROVIDER_SITE_OTHER): Payer: Medicare HMO | Admitting: Urology

## 2017-09-13 ENCOUNTER — Encounter: Payer: Self-pay | Admitting: Urology

## 2017-09-13 VITALS — BP 111/61 | HR 79 | Ht 68.0 in | Wt 120.0 lb

## 2017-09-13 DIAGNOSIS — Z87898 Personal history of other specified conditions: Secondary | ICD-10-CM | POA: Diagnosis not present

## 2017-09-13 DIAGNOSIS — Z87440 Personal history of urinary (tract) infections: Secondary | ICD-10-CM

## 2017-09-13 DIAGNOSIS — Z8546 Personal history of malignant neoplasm of prostate: Secondary | ICD-10-CM | POA: Diagnosis not present

## 2017-09-13 LAB — URINALYSIS, COMPLETE
Bilirubin, UA: NEGATIVE
GLUCOSE, UA: NEGATIVE
Nitrite, UA: POSITIVE — AB
Specific Gravity, UA: 1.025 (ref 1.005–1.030)
Urobilinogen, Ur: 0.2 mg/dL (ref 0.2–1.0)
pH, UA: 6 (ref 5.0–7.5)

## 2017-09-13 LAB — MICROSCOPIC EXAMINATION

## 2017-09-13 LAB — BLADDER SCAN AMB NON-IMAGING: Scan Result: >410

## 2017-09-13 MED ORDER — AMOXICILLIN-POT CLAVULANATE 875-125 MG PO TABS: 1.0000 | ORAL_TABLET | Freq: Two times a day (BID) | ORAL | 0 refills | Status: DC

## 2017-09-13 NOTE — Progress Notes (Signed)
Continuous Intermittent Catheterization  Due to urinary retention patient is present today for a teaching of self I & O Catheterization. Patient was given detailed verbal and printed instructions of self catheterization. Patient was cleaned and prepped in a sterile fashion.  With instruction and assistance patient inserted a 14FR and urine return was noted 300 ml, urine was dark yellow in color. Patient tolerated well, no complications were noted Patient was given a sample bag with supplies to take home.  Instructions were given per Larene [place] for patient to cath 2  times daily.   Preformed by: Elberta Leatherwood, CMA

## 2017-09-13 NOTE — Patient Instructions (Signed)
Cystoscopy  Cystoscopy is a procedure that is used to help diagnose and sometimes treat conditions that affect that lower urinary tract. The lower urinary tract includes the bladder and the tube that drains urine from the bladder out of the body (urethra). Cystoscopy is performed with a thin, tube-shaped instrument with a light and camera at the end (cystoscope). The cystoscope may be hard (rigid) or flexible, depending on the goal of the procedure.The cystoscope is inserted through the urethra, into the bladder.  Cystoscopy may be recommended if you have:   Urinary tractinfections that keep coming back (recurring).   Blood in the urine (hematuria).   Loss of bladder control (urinary incontinence) or an overactive bladder.   Unusual cells found in a urine sample.   A blockage in the urethra.   Painful urination.   An abnormality in the bladder found during an intravenous pyelogram (IVP) or CT scan.    Cystoscopy may also be done to remove a sample of tissue to be examined under a microscope (biopsy).  Tell a health care provider about:   Any allergies you have.   All medicines you are taking, including vitamins, herbs, eye drops, creams, and over-the-counter medicines.   Any problems you or family members have had with anesthetic medicines.   Any blood disorders you have.   Any surgeries you have had.   Any medical conditions you have.   Whether you are pregnant or may be pregnant.  What are the risks?  Generally, this is a safe procedure. However, problems may occur, including:   Infection.   Bleeding.   Allergic reactions to medicines.   Damage to other structures or organs.    What happens before the procedure?   Ask your health care provider about:  ? Changing or stopping your regular medicines. This is especially important if you are taking diabetes medicines or blood thinners.  ? Taking medicines such as aspirin and ibuprofen. These medicines can thin your blood. Do not take these medicines  before your procedure if your health care provider instructs you not to.   Follow instructions from your health care provider about eating or drinking restrictions.   You may be given antibiotic medicine to help prevent infection.   You may have an exam or testing, such as X-rays of the bladder, urethra, or kidneys.   You may have urine tests to check for signs of infection.   Plan to have someone take you home after the procedure.  What happens during the procedure?   To reduce your risk of infection,your health care team will wash or sanitize their hands.   You will be given one or more of the following:  ? A medicine to help you relax (sedative).  ? A medicine to numb the area (local anesthetic).   The area around the opening of your urethra will be cleaned.   The cystoscope will be passed through your urethra into your bladder.   Germ-free (sterile)fluid will flow through the cystoscope to fill your bladder. The fluid will stretch your bladder so that your surgeon can clearly examine your bladder walls.   The cystoscope will be removed and your bladder will be emptied.  The procedure may vary among health care providers and hospitals.  What happens after the procedure?   You may have some soreness or pain in your abdomen and urethra. Medicines will be available to help you.   You may have some blood in your urine.   Do not   drive for 24 hours if you received a sedative.  This information is not intended to replace advice given to you by your health care provider. Make sure you discuss any questions you have with your health care provider.  Document Released: 02/05/2000 Document Revised: 06/18/2015 Document Reviewed: 12/25/2014  Elsevier Interactive Patient Education  2018 [employer]

## 2017-09-14 ENCOUNTER — Telehealth: Payer: Self-pay | Admitting: Family Medicine

## 2017-09-14 LAB — PSA: Prostate Specific Ag, Serum: <0.1 ng/mL (ref 0.0–4.0)

## 2017-09-14 NOTE — Telephone Encounter (Signed)
-----   Message from Nori Riis, PA-C sent at 09/14/2017  7:57 AM EDT ----- Please let Mr. Zappone know that his PSA remains undetectable.

## 2017-09-14 NOTE — Telephone Encounter (Signed)
Patient notified

## 2017-09-15 ENCOUNTER — Ambulatory Visit (INDEPENDENT_AMBULATORY_CARE_PROVIDER_SITE_OTHER): Payer: Medicare HMO | Admitting: Urology

## 2017-09-15 ENCOUNTER — Encounter: Payer: Self-pay | Admitting: Urology

## 2017-09-15 DIAGNOSIS — R339 Retention of urine, unspecified: Secondary | ICD-10-CM

## 2017-09-15 DIAGNOSIS — Z8546 Personal history of malignant neoplasm of prostate: Secondary | ICD-10-CM

## 2017-09-15 NOTE — Progress Notes (Signed)
   09/15/17  CC:  Chief Complaint  Patient presents with  . Follow-up    difficult foley    HPI: 82 year old male with a history of prostate cancer status post brachii seed implantation and urinary retention on CIC.  He was taught how to do self cath in the office at which time he was able to successfully do it.  Unfortunately, since going home, he has been unable to catheterize himself meeting resistance.  In our office, we also had difficulty catheterizing him.  I attempted to place an 24 [place] coud using standard sterile technique unsuccessfully.  As such, he was counseled to undergo cystoscopic Foley catheter placement.  NED. A&Ox3.   No respiratory distress   Abd soft, NT, ND Normal phallus with bilateral descended testicles  Cystoscopy Procedure Note  Patient identification was confirmed, informed consent was obtained, and patient was prepped using Betadine solution.  Lidocaine jelly was administered per urethral meatus.    Preoperative abx where received prior to procedure.     Pre-Procedure: - Inspection reveals a normal caliber ureteral meatus.  Procedure: The flexible cystoscope was introduced without difficulty  The scope was then advanced to the level of the prostatic fossa was there is grossly abnormal.  It was shaggy white necrotic and almost unrecognizable.  The true lumen was difficult to visualize.  A somewhat blindly advanced a wire which the patient indicated that he fell in his bladder.  Followed by scope along the wire until ultimately I was able to break through this tissue into the bladder.  Bladder itself was normal and distended.  At this point in time, the scope was removed and the wire was left in place.  A 16 French council tip catheter was inserted over the wire with some resistance at the level of the prostatic urethra but ultimately successful.  The balloon was filled with 10 cc of sterile water.  The wire was removed.  The catheter drained  well.   Post-Procedure: - Patient tolerated the procedure well  Assessment/ Plan:  1. Urinary retention Difficult Foley catheter placement requiring cystoscopy and catheter placement over a wire today I suspect his urinary retention is related to the pathology visualized on cystoscopy today Ultimately, he will likely need TUR and biopsy of this necrotic tissue to ensure that there is no underlying malignancy and also help open up his prostatic fossa I will discuss this further with him at his follow-up visit in 2 weeks Maintain Foley at this time  2. History of prostate cancer Most recent PSA undetectable S/p brachytherapy  Return in about 2 weeks (around 09/29/2017) for discuss bladder management.

## 2017-09-16 LAB — CULTURE, URINE COMPREHENSIVE

## 2017-09-18 ENCOUNTER — Telehealth: Payer: Self-pay

## 2017-09-18 MED ORDER — NITROFURANTOIN MONOHYD MACRO 100 MG PO CAPS: 100.0000 mg | ORAL_CAPSULE | Freq: Two times a day (BID) | ORAL | 0 refills | Status: AC

## 2017-09-18 NOTE — Telephone Encounter (Signed)
-----   Message from Nori Riis, PA-C sent at 09/16/2017  3:10 PM EDT ----- Please let Mr. Makarewicz know that his urine culture was positive and he needs to start nitrofurantoin 100 mg twice daily for seven days.

## 2017-09-18 NOTE — Telephone Encounter (Signed)
Pt informed, abx sent into pharmacy.

## 2017-10-03 ENCOUNTER — Encounter: Payer: Self-pay | Admitting: Urology

## 2017-10-03 ENCOUNTER — Ambulatory Visit: Payer: Medicare HMO | Admitting: Urology

## 2017-10-03 VITALS — BP 121/66 | HR 80 | Ht 68.0 in | Wt 122.0 lb

## 2017-10-03 DIAGNOSIS — Z8546 Personal history of malignant neoplasm of prostate: Secondary | ICD-10-CM

## 2017-10-03 DIAGNOSIS — R339 Retention of urine, unspecified: Secondary | ICD-10-CM | POA: Diagnosis not present

## 2017-10-03 NOTE — Progress Notes (Signed)
10/03/2017 1:35 PM   [place] September 27, 1935 [id number]  Referring provider: [name], MD [address] [place], [place] [address]  Chief Complaint  Patient presents with  . Urinary Retention    discussion    HPI: 82 year old male with a history of urinary retention and prostate cancer who returns today to discuss bladder management.  He initially presented to our practice in 12/2016 with urinary retention requiring Foley catheter.  He was started on maximal medical therapy at that time.  Ultimately, he was able to pass a voiding trial but went back into retention last month.  He was taught how to clean intermittent catheterization but was having difficulty doing so.  He return to our clinic urgently a few weeks ago unable to place his catheter.  Ultimately, this required cystoscopic placement of his Foley catheter which time he was noted to have a shaggy necrotic torturous prostate.  The catheter was placed successfully over a wire.  He is currently on finasteride and Flomax.  He does have a remote history of prostate cancer status post implantation of brachytherapy seeds in 2006.  His PSA has remained undetectable as of 09/13/2017.   PMH: Past Medical History:  Diagnosis Date  . Collagen vascular disease (HCC)    rheumatoid arthritis  . COPD (chronic obstructive pulmonary disease) ([place])   . GERD (gastroesophageal reflux disease)   . History of prostate cancer   . HOH (hard of hearing)   . Insomnia   . Lung cancer ([place]) 2017   rad tx's.  . Oxygen deficit    2L HS AND PRN  . Prostate cancer ([place]) 2006   Rad seed tx's.  . Stroke [facility])    2000    Surgical History: Past Surgical History:  Procedure Laterality Date  . APPENDECTOMY  1965  . CATARACT EXTRACTION W/PHACO Right 01/05/2016   Procedure: CATARACT EXTRACTION PHACO AND INTRAOCULAR LENS PLACEMENT (IOC);  Surgeon: [name], MD;  Location: [facility] ORS;  Service: Ophthalmology;   Laterality: Right;  [place] 1.14 AP% 18.1 CDE 13.44 FLUID PACK LOT # [id number] H  . ENDOBRONCHIAL ULTRASOUND N/A 02/26/2015   Procedure: ENDOBRONCHIAL ULTRASOUND;  Surgeon: Flora Lipps, MD;  Location: [facility] ORS;  Service: Cardiopulmonary;  Laterality: N/A;  . HERNIA REPAIR  1965    Home Medications:  Allergies as of 10/03/2017      Reactions   Plaquenil [hydroxychloroquine]    Simvastatin Other (See Comments)   Bad dreams      Medication List        Accurate as of 10/03/17  1:35 PM. Always use your most recent med list.          albuterol 108 (90 Base) MCG/ACT inhaler Commonly known as:  PROVENTIL HFA;VENTOLIN HFA Inhale 2 puffs into the lungs every 6 (six) hours as needed for wheezing.   amoxicillin-clavulanate 875-125 MG tablet Commonly known as:  AUGMENTIN Take 1 tablet by mouth every 12 (twelve) hours.   atorvastatin 10 MG tablet Commonly known as:  LIPITOR Take 1 tablet (10 mg total) by mouth daily at 6 PM.   B-complex with vitamin C tablet Take 1 tablet by mouth daily.   finasteride 5 MG tablet Commonly known as:  PROSCAR Take 1 tablet (5 mg total) by mouth daily.   Fluticasone-Umeclidin-Vilant 100-62.5-25 MCG/INH Aepb Inhale 1 puff into the lungs daily. In place of Advair and Spiriva   folic acid 1 MG tablet Commonly known as:  FOLVITE Take 1 tablet (1 mg total) by  mouth daily.   methotrexate 2.5 MG tablet Commonly known as:  RHEUMATREX Take 7 tablets by mouth once a week.   omeprazole 40 MG capsule Commonly known as:  PRILOSEC Take 1 capsule (40 mg total) by mouth daily.   OXYGEN Place 2 L into the nose daily.   tamsulosin 0.4 MG Caps capsule Commonly known as:  FLOMAX Take 1 capsule (0.4 mg total) by mouth daily.   traZODone 100 MG tablet Commonly known as:  DESYREL TAKE 1 TABLET BY MOUTH EVERYDAY AT BEDTIME       Allergies:  Allergies  Allergen Reactions  . Plaquenil [Hydroxychloroquine]   . Simvastatin Other (See Comments)    Bad dreams     Family History: Family History  Problem Relation Age of Onset  . Cancer Mother   . Cancer Father        Lung Cancer  . Cancer Sister        breast  . Cancer Brother        stomach    Social History:  reports that he quit smoking about 4 years ago. His smoking use included cigarettes. He has a 25.00 pack-year smoking history. He has never used smokeless tobacco. He reports that he does not drink alcohol or use drugs.  ROS: UROLOGY Frequent Urination?: Yes Hard to postpone urination?: Yes Burning/pain with urination?: Yes Get up at night to urinate?: Yes Leakage of urine?: Yes Urine stream starts and stops?: Yes Trouble starting stream?: Yes Do you have to strain to urinate?: Yes Blood in urine?: No Urinary tract infection?: Yes Sexually transmitted disease?: No Injury to kidneys or bladder?: No Painful intercourse?: No Weak stream?: No Erection problems?: Yes Penile pain?: Yes  Gastrointestinal Nausea?: No Vomiting?: No Indigestion/heartburn?: No Diarrhea?: No Constipation?: No  Constitutional Fever: No Night sweats?: No Weight loss?: Yes Fatigue?: No  Skin Skin rash/lesions?: No Itching?: No  Eyes Blurred vision?: No Double vision?: No  Ears/Nose/Throat Sore throat?: No Sinus problems?: No  Hematologic/Lymphatic Swollen glands?: No Easy bruising?: No  Cardiovascular Leg swelling?: No Chest pain?: No  Respiratory Cough?: No Shortness of breath?: Yes  Endocrine Excessive thirst?: No  Musculoskeletal Back pain?: No Joint pain?: No  Neurological Headaches?: No Dizziness?: No  Psychologic Depression?: No Anxiety?: No  Physical Exam: BP 121/66   Pulse 80   Ht 5\' 8"  (1.727 m)   Wt 122 lb (55.3 kg)   BMI 18.55 kg/m   Constitutional:  Alert and oriented, No acute distress.  Elderly appearing, somewhat frail wearing O2. HEENT: [place] AT, moist mucus membranes.  Trachea midline, no masses. Cardiovascular: No clubbing, cyanosis, or  edema. Respiratory: Normal respiratory effort, no increased work of breathing. GU: Foley catheter in place draining clear yellow urine Skin: No rashes, bruises or suspicious lesions. Neurologic: Grossly intact, no focal deficits, moving all 4 extremities. Psychiatric: Normal mood and affect.  Laboratory Data: Lab Results  Component Value Date   WBC 3.4 (L) 04/24/2017   HGB 11.0 (L) 04/24/2017   HCT 33.5 (L) 04/24/2017   MCV 92.0 04/24/2017   PLT 302 04/24/2017    Lab Results  Component Value Date   CREATININE 1.20 07/27/2017    PSA as above  Urinalysis N/a  Pertinent Imaging: n/a  Assessment & Plan:    1. Urinary retention Currently managed with indwelling Foley catheter Based on the cystoscopic appearance of his prostate, I suspect that this is obstructive and may have underlying pathology I have counseled him to present to the operating  room for cystoscopy, possible channel TURP to open up his prostatic fossa as well as biopsy the abnormal irregular tissue We discussed the risks including risk of bleeding, infection, damage surrounding structures, retrograde ejaculation, urinary incontinence.  He will likely stay overnight for observation.  He will likely be discharged home with a Foley catheter placed for a week followed by voiding trial.  Given his history of pulmonary disease and oxygen requirement, I recommended that we do this procedure under spinal anesthetic.  He is agreeable this plan.  Preop UA/urine culture obtained from his Foley catheter today. - CULTURE, URINE COMPREHENSIVE  2. History of prostate cancer Status post implantation of brachytherapy seeds PSA remains undetectable   Hollice Espy, MD  [facility] [address] [place], [place] [address] [phone]

## 2017-10-03 NOTE — H&P (View-Only) (Signed)
10/03/2017 1:35 PM   [place] [birth year] [id number]  Referring provider: [name], MD [address] [place],  [address]  Chief Complaint  Patient presents with  . Urinary Retention    discussion    HPI: 82 year old male with a history of urinary retention and prostate cancer who returns today to discuss bladder management.  He initially presented to our practice in 12/2016 with urinary retention requiring Foley catheter.  He was started on maximal medical therapy at that time.  Ultimately, he was able to pass a voiding trial but went back into retention last month.  He was taught how to clean intermittent catheterization but was having difficulty doing so.  He return to our clinic urgently a few weeks ago unable to place his catheter.  Ultimately, this required cystoscopic placement of his Foley catheter which time he was noted to have a shaggy necrotic torturous prostate.  The catheter was placed successfully over a wire.  He is currently on finasteride and Flomax.  He does have a remote history of prostate cancer status post implantation of brachytherapy seeds in 2006.  His PSA has remained undetectable as of 09/13/2017.   PMH: Past Medical History:  Diagnosis Date  . Collagen vascular disease (HCC)    rheumatoid arthritis  . COPD (chronic obstructive pulmonary disease) ([place])   . GERD (gastroesophageal reflux disease)   . History of prostate cancer   . HOH (hard of hearing)   . Insomnia   . Lung cancer ([place]) 2017   rad tx's.  . Oxygen deficit    2L HS AND PRN  . Prostate cancer ([place]) 2006   Rad seed tx's.  . Stroke [facility])    2000    Surgical History: Past Surgical History:  Procedure Laterality Date  . APPENDECTOMY  1965  . CATARACT EXTRACTION W/PHACO Right 01/05/2016   Procedure: CATARACT EXTRACTION PHACO AND INTRAOCULAR LENS PLACEMENT (IOC);  Surgeon: [name], MD;  Location: [facility] ORS;  Service: Ophthalmology;   Laterality: Right;  [place] 1.14 AP% 18.1 CDE 13.44 FLUID PACK LOT # [id number] H  . ENDOBRONCHIAL ULTRASOUND N/A 02/26/2015   Procedure: ENDOBRONCHIAL ULTRASOUND;  Surgeon: Flora Lipps, MD;  Location: [facility] ORS;  Service: Cardiopulmonary;  Laterality: N/A;  . HERNIA REPAIR  1965    Home Medications:  Allergies as of 10/03/2017      Reactions   Plaquenil [hydroxychloroquine]    Simvastatin Other (See Comments)   Bad dreams      Medication List        Accurate as of 10/03/17  1:35 PM. Always use your most recent med list.          albuterol 108 (90 Base) MCG/ACT inhaler Commonly known as:  PROVENTIL HFA;VENTOLIN HFA Inhale 2 puffs into the lungs every 6 (six) hours as needed for wheezing.   amoxicillin-clavulanate 875-125 MG tablet Commonly known as:  AUGMENTIN Take 1 tablet by mouth every 12 (twelve) hours.   atorvastatin 10 MG tablet Commonly known as:  LIPITOR Take 1 tablet (10 mg total) by mouth daily at 6 PM.   B-complex with vitamin C tablet Take 1 tablet by mouth daily.   finasteride 5 MG tablet Commonly known as:  PROSCAR Take 1 tablet (5 mg total) by mouth daily.   Fluticasone-Umeclidin-Vilant 100-62.5-25 MCG/INH Aepb Inhale 1 puff into the lungs daily. In place of Advair and Spiriva   folic acid 1 MG tablet Commonly known as:  FOLVITE Take 1 tablet (1 mg total) by  mouth daily.   methotrexate 2.5 MG tablet Commonly known as:  RHEUMATREX Take 7 tablets by mouth once a week.   omeprazole 40 MG capsule Commonly known as:  PRILOSEC Take 1 capsule (40 mg total) by mouth daily.   OXYGEN Place 2 L into the nose daily.   tamsulosin 0.4 MG Caps capsule Commonly known as:  FLOMAX Take 1 capsule (0.4 mg total) by mouth daily.   traZODone 100 MG tablet Commonly known as:  DESYREL TAKE 1 TABLET BY MOUTH EVERYDAY AT BEDTIME       Allergies:  Allergies  Allergen Reactions  . Plaquenil [Hydroxychloroquine]   . Simvastatin Other (See Comments)    Bad dreams     Family History: Family History  Problem Relation Age of Onset  . Cancer Mother   . Cancer Father        Lung Cancer  . Cancer Sister        breast  . Cancer Brother        stomach    Social History:  reports that he quit smoking about 4 years ago. His smoking use included cigarettes. He has a 25.00 pack-year smoking history. He has never used smokeless tobacco. He reports that he does not drink alcohol or use drugs.  ROS: UROLOGY Frequent Urination?: Yes Hard to postpone urination?: Yes Burning/pain with urination?: Yes Get up at night to urinate?: Yes Leakage of urine?: Yes Urine stream starts and stops?: Yes Trouble starting stream?: Yes Do you have to strain to urinate?: Yes Blood in urine?: No Urinary tract infection?: Yes Sexually transmitted disease?: No Injury to kidneys or bladder?: No Painful intercourse?: No Weak stream?: No Erection problems?: Yes Penile pain?: Yes  Gastrointestinal Nausea?: No Vomiting?: No Indigestion/heartburn?: No Diarrhea?: No Constipation?: No  Constitutional Fever: No Night sweats?: No Weight loss?: Yes Fatigue?: No  Skin Skin rash/lesions?: No Itching?: No  Eyes Blurred vision?: No Double vision?: No  Ears/Nose/Throat Sore throat?: No Sinus problems?: No  Hematologic/Lymphatic Swollen glands?: No Easy bruising?: No  Cardiovascular Leg swelling?: No Chest pain?: No  Respiratory Cough?: No Shortness of breath?: Yes  Endocrine Excessive thirst?: No  Musculoskeletal Back pain?: No Joint pain?: No  Neurological Headaches?: No Dizziness?: No  Psychologic Depression?: No Anxiety?: No  Physical Exam: BP 121/66   Pulse 80   Ht 5\' 8"  (1.727 m)   Wt 122 lb (55.3 kg)   BMI 18.55 kg/m   Constitutional:  Alert and oriented, No acute distress.  Elderly appearing, somewhat frail wearing O2. HEENT: [place] AT, moist mucus membranes.  Trachea midline, no masses. Cardiovascular: No clubbing, cyanosis, or  edema. Respiratory: Normal respiratory effort, no increased work of breathing. GU: Foley catheter in place draining clear yellow urine Skin: No rashes, bruises or suspicious lesions. Neurologic: Grossly intact, no focal deficits, moving all 4 extremities. Psychiatric: Normal mood and affect.  Laboratory Data: Lab Results  Component Value Date   WBC 3.4 (L) 04/24/2017   HGB 11.0 (L) 04/24/2017   HCT 33.5 (L) 04/24/2017   MCV 92.0 04/24/2017   PLT 302 04/24/2017    Lab Results  Component Value Date   CREATININE 1.20 07/27/2017    PSA as above  Urinalysis N/a  Pertinent Imaging: n/a  Assessment & Plan:    1. Urinary retention Currently managed with indwelling Foley catheter Based on the cystoscopic appearance of his prostate, I suspect that this is obstructive and may have underlying pathology I have counseled him to present to the operating  room for cystoscopy, possible channel TURP to open up his prostatic fossa as well as biopsy the abnormal irregular tissue We discussed the risks including risk of bleeding, infection, damage surrounding structures, retrograde ejaculation, urinary incontinence.  He will likely stay overnight for observation.  He will likely be discharged home with a Foley catheter placed for a week followed by voiding trial.  Given his history of pulmonary disease and oxygen requirement, I recommended that we do this procedure under spinal anesthetic.  He is agreeable this plan.  Preop UA/urine culture obtained from his Foley catheter today. - CULTURE, URINE COMPREHENSIVE  2. History of prostate cancer Status post implantation of brachytherapy seeds PSA remains undetectable   Hollice Espy, MD  [facility] [address] [place], [place] [address] [phone]

## 2017-10-05 ENCOUNTER — Other Ambulatory Visit: Payer: Self-pay | Admitting: Radiology

## 2017-10-05 DIAGNOSIS — R339 Retention of urine, unspecified: Secondary | ICD-10-CM

## 2017-10-06 ENCOUNTER — Telehealth: Payer: Self-pay | Admitting: Radiology

## 2017-10-06 ENCOUNTER — Other Ambulatory Visit: Payer: Self-pay | Admitting: Radiology

## 2017-10-06 DIAGNOSIS — Z87440 Personal history of urinary (tract) infections: Secondary | ICD-10-CM

## 2017-10-06 DIAGNOSIS — R339 Retention of urine, unspecified: Secondary | ICD-10-CM

## 2017-10-06 LAB — CULTURE, URINE COMPREHENSIVE

## 2017-10-06 MED ORDER — NITROFURANTOIN MONOHYD MACRO 100 MG PO CAPS: 100.0000 mg | ORAL_CAPSULE | Freq: Two times a day (BID) | ORAL | 0 refills | Status: AC

## 2017-10-06 NOTE — Telephone Encounter (Signed)
-----   Message from Hollice Espy, MD sent at 10/06/2017  2:57 PM EDT ----- Could you please have this patient start taking nitrofurantoin 100 mg twice daily x10 days?  Also, could you please change the preoperative antibiotics to ceftriaxone.  Thanks!

## 2017-10-06 NOTE — Telephone Encounter (Signed)
Made patient aware of script sent to pharmacy & pre-op antibiotics changed to ceftriaxone.

## 2017-10-09 ENCOUNTER — Other Ambulatory Visit: Payer: Self-pay | Admitting: Radiology

## 2017-10-09 ENCOUNTER — Telehealth: Payer: Self-pay | Admitting: Radiology

## 2017-10-09 NOTE — Telephone Encounter (Signed)
-----   Message from Hollice Espy, MD sent at 10/06/2017  5:16 PM EDT ----- Yes lets do, like 2 weeks before  ----- Message ----- From: Ranell Patrick, RN Sent: 10/06/2017   4:12 PM EDT To: Hollice Espy, MD  His surgery is scheduled 11/01/2017. Should we repeat a urine culture prior?

## 2017-10-09 NOTE — Telephone Encounter (Signed)
Notified patient of appointment for repeat cathed urine culture prior to surgery. Patient voices understanding.

## 2017-10-10 ENCOUNTER — Ambulatory Visit: Payer: Medicare HMO | Admitting: Family Medicine

## 2017-10-11 ENCOUNTER — Ambulatory Visit (INDEPENDENT_AMBULATORY_CARE_PROVIDER_SITE_OTHER): Payer: Medicare HMO | Admitting: Family Medicine

## 2017-10-11 ENCOUNTER — Encounter: Payer: Self-pay | Admitting: Family Medicine

## 2017-10-11 VITALS — BP 98/68 | HR 77 | Temp 97.4°F | Resp 12 | Ht 68.0 in | Wt 117.4 lb

## 2017-10-11 DIAGNOSIS — I69354 Hemiplegia and hemiparesis following cerebral infarction affecting left non-dominant side: Secondary | ICD-10-CM

## 2017-10-11 DIAGNOSIS — Z9981 Dependence on supplemental oxygen: Secondary | ICD-10-CM

## 2017-10-11 DIAGNOSIS — C3491 Malignant neoplasm of unspecified part of right bronchus or lung: Secondary | ICD-10-CM

## 2017-10-11 DIAGNOSIS — M069 Rheumatoid arthritis, unspecified: Secondary | ICD-10-CM

## 2017-10-11 DIAGNOSIS — C61 Malignant neoplasm of prostate: Secondary | ICD-10-CM

## 2017-10-11 DIAGNOSIS — Z01818 Encounter for other preprocedural examination: Secondary | ICD-10-CM

## 2017-10-11 NOTE — Progress Notes (Signed)
Name: Collin Gonzales   MRN: [id number]    DOB: [birth year]   Date:10/11/2017       Progress Note  Subjective  Chief Complaint  Chief Complaint  Patient presents with  . Medical Clearance    HPI  Pre-op: he has a history of CVA with left side hemiparesis, he has been compliant with statin therapy and he has been stable since. He has a history of aflutter, but last EKG was sinus rhythm and he is not on blood thinners. History of COPD and lung cancer ( s/p radiation therapy) he uses oxygen 2 liters 24 hours per day. He also has a history of mild anemia and we will recheck labs. He has prostate cancer and history of urinary retention and is going to have TURP on 11/01/2017. He is currently taking macrobid and we will check urine function. He states his cough is chronic sometimes productive but stable. No fever or chills. He is poor appetite and history of malnutrition. He has chornic SOB  Is chronic and stable. Denies chest pain or palpitation. He had surgeries in the past and denies any anesthesia reaction. He states surgery is planned for spinal tap and not general anesthesia. He had CT scan chest done recently that showed post-radiation changes and lung nodules. He also has aortic atherosclerosis. He is on methotrexate for RA ( under the care of Dr. Jefm Bryant and has a history of pancytopenia)    Patient Active Problem List   Diagnosis Date Noted  . Dependence on supplemental oxygen 04/24/2017  . Malnutrition ([place]) 04/24/2017  . Hemiparesis affecting left side as late effect of cerebrovascular accident (CVA) ([place]) 04/24/2017  . Cataract 09/06/2016  . Drug-induced folate deficiency anemia 09/06/2016  . Esophagitis, reflux 09/06/2016  . Hypogammaglobulinemia ([place]) 09/06/2016  . Iron deficiency anemia due to chronic blood loss 08/23/2016  . Pancytopenia ([place]) 08/22/2016  . History of stroke with residual effects 06/06/2016  . Decrease in appetite 09/01/2015  . GERD (gastroesophageal reflux  disease) 08/05/2015  . COPD (chronic obstructive pulmonary disease) ([place]) 08/20/2014  . Dyslipidemia 08/20/2014  . Chronic insomnia 08/20/2014  . Lung cancer ([place]) 08/20/2014  . Arthritis or polyarthritis, rheumatoid ([place]) 08/20/2014  . Prostate cancer ([place]) 11/01/2013  . Flutter-fibrillation 03/18/2005    Past Surgical History:  Procedure Laterality Date  . APPENDECTOMY  1965  . CATARACT EXTRACTION W/PHACO Right 01/05/2016   Procedure: CATARACT EXTRACTION PHACO AND INTRAOCULAR LENS PLACEMENT (IOC);  Surgeon: [name], MD;  Location: [facility] ORS;  Service: Ophthalmology;  Laterality: Right;  [place] 1.14 AP% 18.1 CDE 13.44 FLUID PACK LOT # [id number]  . ENDOBRONCHIAL ULTRASOUND N/A 02/26/2015   Procedure: ENDOBRONCHIAL ULTRASOUND;  Surgeon: Flora Lipps, MD;  Location: [facility] ORS;  Service: Cardiopulmonary;  Laterality: N/A;  . HERNIA REPAIR  1965    Family History  Problem Relation Age of Onset  . Cancer Mother   . Cancer Father        Lung Cancer  . Cancer Sister        breast  . Cancer Brother        stomach    Social History   Socioeconomic History  . Marital status: Divorced    Spouse name: Not on file  . Number of children: 5  . Years of education: Not on file  . Highest education level: 12th grade  Occupational History  . Occupation: Retired  Scientific laboratory technician  . Financial resource strain: Not hard at all  . Food insecurity:  Worry: Never true    Inability: Never true  . Transportation needs:    Medical: No    Non-medical: No  Tobacco Use  . Smoking status: Former Smoker    Packs/day: 0.50    Years: 50.00    Pack years: 25.00    Types: Cigarettes    Last attempt to quit: 02/21/2013    Years since quitting: 4.6  . Smokeless tobacco: Never Used  . Tobacco comment: smoking cessation materials not required  Substance and Sexual Activity  . Alcohol use: No    Alcohol/week: 0.0 standard drinks  . Drug use: No  . Sexual activity: Never  Lifestyle  . Physical  activity:    Days per week: 0 days    Minutes per session: 0 min  . Stress: Not at all  Relationships  . Social connections:    Talks on phone: Patient refused    Gets together: Patient refused    Attends religious service: Patient refused    Active member of club or organization: Patient refused    Attends meetings of clubs or organizations: Patient refused    Relationship status: Divorced  . Intimate partner violence:    Fear of current or ex partner: No    Emotionally abused: No    Physically abused: No    Forced sexual activity: No  Other Topics Concern  . Not on file  Social History Narrative  . Not on file     Current Outpatient Medications:  .  methotrexate (RHEUMATREX) 2.5 MG tablet, Take 5 tablets by mouth once a week., Disp: , Rfl:  .  albuterol (PROAIR HFA) 108 (90 Base) MCG/ACT inhaler, Inhale 2 puffs into the lungs every 6 (six) hours as needed for wheezing., Disp: 3 Inhaler, Rfl: 2 .  atorvastatin (LIPITOR) 10 MG tablet, Take 1 tablet (10 mg total) by mouth daily at 6 PM., Disp: 90 tablet, Rfl: 1 .  B Complex-C (B-COMPLEX WITH VITAMIN C) tablet, Take 1 tablet by mouth daily., Disp: , Rfl:  .  finasteride (PROSCAR) 5 MG tablet, Take 1 tablet (5 mg total) by mouth daily., Disp: 30 tablet, Rfl: 11 .  Fluticasone-Umeclidin-Vilant (TRELEGY ELLIPTA) 100-62.5-25 MCG/INH AEPB, Inhale 1 puff into the lungs daily. In place of Advair and Spiriva, Disp: 60 each, Rfl: 5 .  folic acid (FOLVITE) 1 MG tablet, Take 1 tablet (1 mg total) by mouth daily., Disp: 30 tablet, Rfl: 5 .  nitrofurantoin, macrocrystal-monohydrate, (MACROBID) 100 MG capsule, Take 1 capsule (100 mg total) by mouth every 12 (twelve) hours for 10 days., Disp: 20 capsule, Rfl: 0 .  omeprazole (PRILOSEC) 40 MG capsule, Take 1 capsule (40 mg total) by mouth daily., Disp: 90 capsule, Rfl: 1 .  OXYGEN, Place 2 L into the nose daily., Disp: , Rfl:  .  tamsulosin (FLOMAX) 0.4 MG CAPS capsule, Take 1 capsule (0.4 mg total)  by mouth daily., Disp: 30 capsule, Rfl: 11 .  traZODone (DESYREL) 100 MG tablet, TAKE 1 TABLET BY MOUTH EVERYDAY AT BEDTIME, Disp: 90 tablet, Rfl: 1  Allergies  Allergen Reactions  . Plaquenil [Hydroxychloroquine]   . Simvastatin Other (See Comments)    Bad dreams     ROS  Constitutional: Negative for fever or weight change.  Respiratory: Positive for cough and shortness of breath.   Cardiovascular: Negative for chest pain or palpitations.  Gastrointestinal: Negative for abdominal pain, no bowel changes.  Musculoskeletal: Positive  for gait problem but no  joint swelling.  Skin: Negative for rash.  Neurological: Negative for dizziness or headache.  No other specific complaints in a complete review of systems (except as listed in HPI above).  Objective  Vitals:   10/11/17 1202  BP: 98/68  Pulse: 77  Resp: 12  Temp: (!) 97.4 F (36.3 C)  TempSrc: Oral  SpO2: 93%  Weight: 117 lb 6.4 oz (53.3 kg)  Height: 5\' 8"  (1.727 m)    Body mass index is 17.85 kg/m.  Physical Exam  Constitutional: Patient appears well-developed and thin, No distress.  HEENT: head atraumatic, normocephalic, pupils equal and reactive to light, neck supple, throat within normal limits on nasal cannula oxygen  Cardiovascular: Normal rate, regular rhythm and normal heart sounds.  No murmur heard. No BLE edema. Pulmonary/Chest: Effort normal , distant breath sounds but not abnormal sounds.  No respiratory distress on nasal cannula oxygen  Abdominal: Soft.  There is no tenderness. Psychiatric: Patient has a normal mood and affect. behavior is normal. Judgment and thought content normal. Neurological : weaker on left side, but able to walk on his own.  Muscular skeletal: no deformities  Recent Results (from the past 2160 hour(s))  I-STAT creatinine     Status: None   Collection Time: 07/27/17  9:36 AM  Result Value Ref Range   Creatinine, Ser 1.20 0.61 - 1.24 mg/dL  Urinalysis, Complete     Status:  Abnormal   Collection Time: 09/13/17  8:44 AM  Result Value Ref Range   Specific Gravity, UA 1.025 1.005 - 1.030   pH, UA 6.0 5.0 - 7.5   Color, UA Yellow Yellow   Appearance Ur Cloudy (A) Clear   Leukocytes, UA 2+ (A) Negative   Protein, UA 2+ (A) Negative/Trace   Glucose, UA Negative Negative   Ketones, UA Trace (A) Negative   RBC, UA 2+ (A) Negative   Bilirubin, UA Negative Negative   Urobilinogen, Ur 0.2 0.2 - 1.0 mg/dL   Nitrite, UA Positive (A) Negative   Microscopic Examination See below:   Microscopic Examination     Status: Abnormal   Collection Time: 09/13/17  8:44 AM  Result Value Ref Range   WBC, UA >30 (H) 0 - 5 /hpf   RBC, UA 11-30 (A) 0 - 2 /hpf   Epithelial Cells (non renal) 0-10 0 - 10 /hpf   Mucus, UA Present (A) Not Estab.   Bacteria, UA Many (A) None seen/Few  PSA     Status: None   Collection Time: 09/13/17  8:46 AM  Result Value Ref Range   Prostate Specific Ag, Serum <0.1 0.0 - 4.0 ng/mL    Comment: Roche ECLIA methodology. According to the American Urological Association, Serum PSA should decrease and remain at undetectable levels after radical prostatectomy. The AUA defines biochemical recurrence as an initial PSA value 0.2 ng/mL or greater followed by a subsequent confirmatory PSA value 0.2 ng/mL or greater. Values obtained with different assay methods or kits cannot be used interchangeably. Results cannot be interpreted as absolute evidence of the presence or absence of malignant disease.   Bladder Scan (Post Void Residual) in office     Status: None   Collection Time: 09/13/17  8:55 AM  Result Value Ref Range   Scan Result >410   CULTURE, URINE COMPREHENSIVE     Status: Abnormal   Collection Time: 09/13/17  9:23 AM  Result Value Ref Range   Urine Culture, Comprehensive Final report (A)    Organism ID, Bacteria Escherichia coli (A)     Comment: Greater  than 100,000 colony forming units per mL   ANTIMICROBIAL SUSCEPTIBILITY Comment      Comment:       ** S = Susceptible; I = Intermediate; R = Resistant **                    P = Positive; N = Negative             MICS are expressed in micrograms per mL    Antibiotic                 RSLT#1    RSLT#2    RSLT#3    RSLT#4 Amoxicillin/Clavulanic Acid    I Ampicillin                     R Cefepime                       S Ceftriaxone                    S Cefuroxime                     I Ciprofloxacin                  R Ertapenem                      S Gentamicin                     S Imipenem                       S Levofloxacin                   R Meropenem                      S Nitrofurantoin                 S Piperacillin/Tazobactam        S Tetracycline                   R Tobramycin                     S Trimethoprim/Sulfa             R   CULTURE, URINE COMPREHENSIVE     Status: Abnormal   Collection Time: 10/03/17  2:09 PM  Result Value Ref Range   Urine Culture, Comprehensive Final report (A)    Organism ID, Bacteria Escherichia coli (A)     Comment: Greater than 100,000 colony forming units per mL   ANTIMICROBIAL SUSCEPTIBILITY Comment     Comment:       ** S = Susceptible; I = Intermediate; R = Resistant **                    P = Positive; N = Negative             MICS are expressed in micrograms per mL    Antibiotic                 RSLT#1    RSLT#2    RSLT#3    RSLT#4 Amoxicillin/Clavulanic Acid    I Ampicillin  R Cefepime                       S Ceftriaxone                    S Cefuroxime                     I Ciprofloxacin                  R Ertapenem                      S Gentamicin                     S Imipenem                       S Levofloxacin                   R Meropenem                      S Nitrofurantoin                 S Piperacillin/Tazobactam        S Tetracycline                   R Tobramycin                     S Trimethoprim/Sulfa             R       PHQ2/9: Depression screen [facility] 2/9 10/11/2017  08/16/2017 08/08/2017 04/24/2017 03/21/2017  Decreased Interest 0 0 0 0 0  Down, Depressed, Hopeless 0 0 0 0 0  PHQ - 2 Score 0 0 0 0 0  Altered sleeping 0 - 0 - -  Tired, decreased energy 0 - 0 - -  Change in appetite 0 - 0 - -  Feeling bad or failure about yourself  0 - 0 - -  Trouble concentrating 0 - 0 - -  Moving slowly or fidgety/restless 0 - 0 - -  Suicidal thoughts 0 - 0 - -  PHQ-9 Score 0 - 0 - -  Difficult doing work/chores Not difficult at all - Not difficult at all - -     Fall Risk: Fall Risk  10/11/2017 08/16/2017 08/08/2017 07/24/2017 04/24/2017  Falls in the past year? No No No No No  Number falls in past yr: - - - - -  Injury with Fall? - - - - -  Risk for fall due to : - - Impaired vision;Other (Comment);Medication side effect;Impaired mobility - -  Risk for fall due to: Comment - - wears eyeglasses; use of O2, weakness, dyspnea - -     Functional Status Survey: Is the patient deaf or have difficulty hearing?: No Does the patient have difficulty seeing, even when wearing glasses/contacts?: No Does the patient have difficulty concentrating, remembering, or making decisions?: No Does the patient have difficulty walking or climbing stairs?: Yes Does the patient have difficulty dressing or bathing?: No Does the patient have difficulty doing errands alone such as visiting a doctor's office or shopping?: No    Assessment & Plan  1. Prostate cancer [facility])  Seeing Urologist and scheduled for TURP   2. Primary lung cancer, right ([place])  Under the care of Dr.  Crystal, last CT reviewed   3. Dependence on supplemental oxygen  Stable   4. Hemiparesis affecting left side as late effect of cerebrovascular accident (CVA) ([place])  - Lipid panel  5. Pre-op evaluation  Explained that he has multiple risk factors and risk of complications is high, however because of urinary retention and possible reflux he may benefit from having surgery. He understands risk and benefits and  would like to proceed with surgery. We will make sure he is not anemic and kidney function is okay, repeat EKG and will send report to urologist when results are back - CBC with Differential/Platelet - COMPLETE METABOLIC PANEL WITH GFR - EKG 12-Lead - unchanged from January, asymptomatic   6. Rheumatoid arthritis of wrist, unspecified laterality, unspecified rheumatoid factor presence (HCC)  Sees Dr. Jefm Bryant and is doing well on methotrexate

## 2017-10-12 ENCOUNTER — Other Ambulatory Visit: Payer: Medicare HMO | Admitting: Urology

## 2017-10-12 LAB — LIPID PANEL
CHOLESTEROL: 150 mg/dL (ref <200)
HDL: 51 mg/dL (ref >40)
LDL Cholesterol (Calc): 85 mg/dL (calc)
NON-HDL CHOLESTEROL (CALC): 99 mg/dL (ref <130)
TRIGLYCERIDES: 55 mg/dL (ref <150)
Total CHOL/HDL Ratio: 2.9 (calc) (ref <5.0)

## 2017-10-12 LAB — COMPLETE METABOLIC PANEL WITH GFR
AG RATIO: 0.7 (calc) — AB (ref 1.0–2.5)
ALKALINE PHOSPHATASE (APISO): 89 U/L (ref 40–115)
ALT: 5 U/L — ABNORMAL LOW (ref 9–46)
AST: 14 U/L (ref 10–35)
Albumin: 3.3 g/dL — ABNORMAL LOW (ref 3.6–5.1)
BUN: 16 mg/dL (ref 7–25)
CALCIUM: 9.2 mg/dL (ref 8.6–10.3)
CO2: 27 mmol/L (ref 20–32)
Chloride: 100 mmol/L (ref 98–110)
Creat: 1.17 mg/dL (ref 0.70–1.18)
GFR, EST NON AFRICAN AMERICAN: 59 mL/min/{1.73_m2} — AB (ref >=60)
GFR, Est African American: 68 mL/min/{1.73_m2} (ref >=60)
Globulin: 5 g/dL (calc) — ABNORMAL HIGH (ref 1.9–3.7)
Glucose, Bld: 95 mg/dL (ref 65–139)
POTASSIUM: 3.9 mmol/L (ref 3.5–5.3)
Sodium: 137 mmol/L (ref 135–146)
Total Bilirubin: 0.3 mg/dL (ref 0.2–1.2)
Total Protein: 8.3 g/dL — ABNORMAL HIGH (ref 6.1–8.1)

## 2017-10-12 LAB — CBC WITH DIFFERENTIAL/PLATELET
Basophils Absolute: 31 cells/uL (ref 0–200)
Basophils Relative: 0.6 %
EOS PCT: 1.3 %
Eosinophils Absolute: 68 cells/uL (ref 15–500)
HCT: 33.8 % — ABNORMAL LOW (ref 38.5–50.0)
Hemoglobin: 11.1 g/dL — ABNORMAL LOW (ref 13.2–17.1)
Lymphs Abs: 967 cells/uL (ref 850–3900)
MCH: 30.4 pg (ref 27.0–33.0)
MCHC: 32.8 g/dL (ref 32.0–36.0)
MCV: 92.6 fL (ref 80.0–100.0)
MPV: 10.1 fL (ref 7.5–12.5)
Monocytes Relative: 10.2 %
NEUTROS PCT: 69.3 %
Neutro Abs: 3604 cells/uL (ref 1500–7800)
PLATELETS: 302 10*3/uL (ref 140–400)
RBC: 3.65 10*6/uL — AB (ref 4.20–5.80)
RDW: 13.5 % (ref 11.0–15.0)
Total Lymphocyte: 18.6 %
WBC mixed population: 530 cells/uL (ref 200–950)
WBC: 5.2 10*3/uL (ref 3.8–10.8)

## 2017-10-18 ENCOUNTER — Ambulatory Visit: Payer: Medicare HMO

## 2017-10-18 ENCOUNTER — Other Ambulatory Visit: Payer: Self-pay | Admitting: Family Medicine

## 2017-10-18 ENCOUNTER — Other Ambulatory Visit: Payer: Self-pay

## 2017-10-18 DIAGNOSIS — Z9189 Other specified personal risk factors, not elsewhere classified: Secondary | ICD-10-CM

## 2017-10-18 DIAGNOSIS — Z87440 Personal history of urinary (tract) infections: Secondary | ICD-10-CM

## 2017-10-18 LAB — MICROSCOPIC EXAMINATION

## 2017-10-18 LAB — URINALYSIS, COMPLETE
BILIRUBIN UA: NEGATIVE
Glucose, UA: NEGATIVE
Ketones, UA: NEGATIVE
Nitrite, UA: NEGATIVE
Specific Gravity, UA: >1.03 — ABNORMAL HIGH (ref 1.005–1.030)
Urobilinogen, Ur: 0.2 mg/dL (ref 0.2–1.0)
pH, UA: 6 (ref 5.0–7.5)

## 2017-10-18 NOTE — Progress Notes (Signed)
Pt is present in office for a nurse visit today. He is here per Dr. Erlene Quan for a urine analysis and culture prior to surgery. Pt currently has catheter, a cap was applied to end of catheter, pt was given water to drink, a sample was then collected from bag.

## 2017-10-21 LAB — CULTURE, URINE COMPREHENSIVE

## 2017-10-22 DIAGNOSIS — C679 Malignant neoplasm of bladder, unspecified: Secondary | ICD-10-CM

## 2017-10-22 HISTORY — DX: Malignant neoplasm of bladder, unspecified: C67.9

## 2017-10-24 ENCOUNTER — Telehealth: Payer: Self-pay | Admitting: Radiology

## 2017-10-24 ENCOUNTER — Other Ambulatory Visit: Payer: Self-pay | Admitting: Radiology

## 2017-10-24 NOTE — Telephone Encounter (Signed)
Notified patient of positive urine culture with highly resistant bacteria. Appointments made for ceftriaxone injections prior to surgery. Questions answered. Patient voices understanding. Perioperative antibiotics changed as ordered by Dr Erlene Quan.

## 2017-10-24 NOTE — Telephone Encounter (Signed)
-----   Message from Hollice Espy, MD sent at 10/24/2017  8:16 AM EDT ----- His preop culture grew highly resistant E. coli bacteria.  He is chronically colonized.  The goal here would be to decrease bacterial load before surgery.  I would like him to come in and get an injection of ceftriaxone 1 g Monday before surgery and another gram Tuesday before surgery.  Also change his perioperative antibiotics to ceftriaxone 1 g and gentamicin dosing per pharmacy for double coverage.  Hollice Espy, MD

## 2017-10-26 ENCOUNTER — Inpatient Hospital Stay: Admission: RE | Admit: 2017-10-26 | Payer: Medicare HMO | Source: Ambulatory Visit

## 2017-10-26 ENCOUNTER — Encounter
Admission: RE | Admit: 2017-10-26 | Discharge: 2017-10-26 | Disposition: A | Discharge status: Home / Self Care | Payer: Medicare HMO | Source: Ambulatory Visit | Attending: Urology | Admitting: Urology

## 2017-10-26 ENCOUNTER — Other Ambulatory Visit: Payer: Self-pay

## 2017-10-26 DIAGNOSIS — Z01812 Encounter for preprocedural laboratory examination: Secondary | ICD-10-CM | POA: Insufficient documentation

## 2017-10-26 HISTORY — DX: Hyperlipidemia, unspecified: E78.5

## 2017-10-26 LAB — CBC
HEMATOCRIT: 35.3 % — AB (ref 40.0–52.0)
HEMOGLOBIN: 11.5 g/dL — AB (ref 13.0–18.0)
MCH: 31.4 pg (ref 26.0–34.0)
MCHC: 32.6 g/dL (ref 32.0–36.0)
MCV: 96.1 fL (ref 80.0–100.0)
Platelets: 273 10*3/uL (ref 150–440)
RBC: 3.67 MIL/uL — ABNORMAL LOW (ref 4.40–5.90)
RDW: 15.8 % — ABNORMAL HIGH (ref 11.5–14.5)
WBC: 6.1 10*3/uL (ref 3.8–10.6)

## 2017-10-26 NOTE — Patient Instructions (Signed)
Your procedure is scheduled on: Wednesday 11/01/17 Report to [place] ON 2ND FLOOR MEDICAL MALL ENTRANCE. To find out your arrival time please call [phone] between 1PM - 3PM on Tuesday 10/31/17.  Remember: Instructions that are not followed completely may result in serious medical risk, up to and including death, or upon the discretion of your surgeon and anesthesiologist your surgery may need to be rescheduled.     _X__ 1. Do not eat food after midnight the night before your procedure.                 No gum chewing or hard candies. You may drink clear liquids up to 2 hours                 before you are scheduled to arrive for your surgery- DO not drink clear                 liquids within 2 hours of the start of your surgery.                 Clear Liquids include:  water, apple juice without pulp, clear carbohydrate                 drink such as Clearfast or Gatorade, Black Coffee or Tea (Do not add                 anything to coffee or tea).  __X__2.  On the morning of surgery brush your teeth with toothpaste and water, you                 may rinse your mouth with mouthwash if you wish.  Do not swallow any              toothpaste of mouthwash.     _X__ 3.  No Alcohol for 24 hours before or after surgery.   _X__ 4.  Do Not Smoke or use e-cigarettes For 24 Hours Prior to Your Surgery.                 Do not use any chewable tobacco products for at least 6 hours prior to                 surgery.  ____  5.  Bring all medications with you on the day of surgery if instructed.   __X__  6.  Notify your doctor if there is any change in your medical condition      (cold, fever, infections).     Do not wear jewelry, make-up, hairpins, clips or nail polish. Do not wear lotions, powders, or perfumes.  Do not shave 48 hours prior to surgery. Men may shave face and neck. Do not bring valuables to the hospital.    [facility] is not responsible for any belongings or  valuables.  Contacts, dentures/partials or body piercings may not be worn into surgery. Bring a case for your contacts, glasses or hearing aids, a denture cup will be supplied. Leave your suitcase in the car. After surgery it may be brought to your room. For patients admitted to the hospital, discharge time is determined by your treatment team.   Patients discharged the day of surgery will not be allowed to drive home.   Please read over the following fact sheets that you were given:   MRSA Information  __X__ Take these medicines the morning of surgery with A SIP OF WATER:  1. finasteride (PROSCAR)  2. omeprazole (PRILOSEC)  3. tamsulosin (FLOMAX)  4.  5.  6.  ____ Fleet Enema (as directed)   ____ Use CHG Soap/SAGE wipes as directed  __X__ Use inhalers on the day of surgery  ____ Stop metformin/Janumet/Farxiga 2 days prior to surgery    ____ Take 1/2 of usual insulin dose the night before surgery. No insulin the morning          of surgery.   ____ Stop Blood Thinners Coumadin/Plavix/Xarelto/Pleta/Pradaxa/Eliquis/Effient/Aspirin  on   Or contact your Surgeon, Cardiologist or Medical Doctor regarding  ability to stop your blood thinners  __X__ Stop Anti-inflammatories 7 days before surgery such as Advil, Ibuprofen, Motrin,  BC or Goodies Powder, Naprosyn, Naproxen, Aleve, Aspirin    __X__ Stop all herbal supplements, fish oil or vitamin E until after surgery. OK TO CONTINUE YOUR B COMPLEX VITAMINS   ____ Bring C-Pap to the hospital.

## 2017-10-30 ENCOUNTER — Other Ambulatory Visit: Payer: Self-pay

## 2017-10-30 ENCOUNTER — Ambulatory Visit (INDEPENDENT_AMBULATORY_CARE_PROVIDER_SITE_OTHER): Payer: Medicare HMO

## 2017-10-30 VITALS — BP 94/54 | HR 70

## 2017-10-30 DIAGNOSIS — Z87440 Personal history of urinary (tract) infections: Secondary | ICD-10-CM | POA: Diagnosis not present

## 2017-10-30 MED ORDER — CEFTRIAXONE SODIUM 500 MG IJ SOLR
1.0000 g | Freq: Once | INTRAMUSCULAR | Status: AC
  Administered 2017-10-30: 1 g via INTRAMUSCULAR

## 2017-10-31 ENCOUNTER — Ambulatory Visit (INDEPENDENT_AMBULATORY_CARE_PROVIDER_SITE_OTHER): Payer: Medicare HMO

## 2017-10-31 VITALS — BP 88/45 | HR 78 | Ht 68.0 in | Wt 122.0 lb

## 2017-10-31 DIAGNOSIS — Z87440 Personal history of urinary (tract) infections: Secondary | ICD-10-CM | POA: Diagnosis not present

## 2017-10-31 MED ORDER — CEFTRIAXONE SODIUM 500 MG IJ SOLR
1.0000 g | Freq: Once | INTRAMUSCULAR | Status: AC
  Administered 2017-10-31: 1 g via INTRAMUSCULAR

## 2017-10-31 MED ORDER — SODIUM CHLORIDE 0.9 % IV SOLN
1.0000 g | Freq: Once | INTRAVENOUS | Status: AC
  Administered 2017-11-01: 1 g via INTRAVENOUS
  Filled 2017-10-31: qty 1

## 2017-10-31 NOTE — Progress Notes (Signed)
Pt is present to day for an injection of Rocephin, prior to his surgery on 11/01/17.  He received an injection in the left ventrogluteal. The Rocephin was mixed with 2.75ml of lidocaine w/ Epi. Pt tolerated well.

## 2017-11-01 ENCOUNTER — Ambulatory Visit: Payer: Medicare HMO | Admitting: Anesthesiology

## 2017-11-01 ENCOUNTER — Other Ambulatory Visit: Payer: Self-pay

## 2017-11-01 ENCOUNTER — Observation Stay
Admission: RE | Admit: 2017-11-01 | Discharge: 2017-11-02 | Disposition: A | Discharge status: Home / Self Care | Payer: Medicare HMO | Source: Ambulatory Visit | Attending: Urology | Admitting: Urology

## 2017-11-01 ENCOUNTER — Encounter: Payer: Self-pay | Admitting: *Deleted

## 2017-11-01 ENCOUNTER — Encounter
Admission: RE | Disposition: A | Discharge status: Home / Self Care | Payer: Self-pay | Source: Ambulatory Visit | Attending: Urology

## 2017-11-01 DIAGNOSIS — E785 Hyperlipidemia, unspecified: Secondary | ICD-10-CM | POA: Insufficient documentation

## 2017-11-01 DIAGNOSIS — G4700 Insomnia, unspecified: Secondary | ICD-10-CM | POA: Insufficient documentation

## 2017-11-01 DIAGNOSIS — N323 Diverticulum of bladder: Secondary | ICD-10-CM | POA: Insufficient documentation

## 2017-11-01 DIAGNOSIS — Z7951 Long term (current) use of inhaled steroids: Secondary | ICD-10-CM | POA: Insufficient documentation

## 2017-11-01 DIAGNOSIS — Z85118 Personal history of other malignant neoplasm of bronchus and lung: Secondary | ICD-10-CM | POA: Insufficient documentation

## 2017-11-01 DIAGNOSIS — M069 Rheumatoid arthritis, unspecified: Secondary | ICD-10-CM | POA: Insufficient documentation

## 2017-11-01 DIAGNOSIS — Y838 Other surgical procedures as the cause of abnormal reaction of the patient, or of later complication, without mention of misadventure at the time of the procedure: Secondary | ICD-10-CM | POA: Insufficient documentation

## 2017-11-01 DIAGNOSIS — R339 Retention of urine, unspecified: Secondary | ICD-10-CM | POA: Insufficient documentation

## 2017-11-01 DIAGNOSIS — J449 Chronic obstructive pulmonary disease, unspecified: Secondary | ICD-10-CM | POA: Insufficient documentation

## 2017-11-01 DIAGNOSIS — T83511A Infection and inflammatory reaction due to indwelling urethral catheter, initial encounter: Secondary | ICD-10-CM | POA: Diagnosis not present

## 2017-11-01 DIAGNOSIS — K210 Gastro-esophageal reflux disease with esophagitis: Secondary | ICD-10-CM | POA: Insufficient documentation

## 2017-11-01 DIAGNOSIS — Z9981 Dependence on supplemental oxygen: Secondary | ICD-10-CM | POA: Diagnosis not present

## 2017-11-01 DIAGNOSIS — I69354 Hemiplegia and hemiparesis following cerebral infarction affecting left non-dominant side: Secondary | ICD-10-CM | POA: Diagnosis not present

## 2017-11-01 DIAGNOSIS — C61 Malignant neoplasm of prostate: Principal | ICD-10-CM | POA: Insufficient documentation

## 2017-11-01 DIAGNOSIS — Z79899 Other long term (current) drug therapy: Secondary | ICD-10-CM | POA: Diagnosis not present

## 2017-11-01 DIAGNOSIS — Z87891 Personal history of nicotine dependence: Secondary | ICD-10-CM | POA: Insufficient documentation

## 2017-11-01 DIAGNOSIS — Z923 Personal history of irradiation: Secondary | ICD-10-CM | POA: Insufficient documentation

## 2017-11-01 HISTORY — PX: CYSTOSCOPY W/ RETROGRADES: SHX1426

## 2017-11-01 HISTORY — PX: TRANSURETHRAL RESECTION OF PROSTATE: SHX73

## 2017-11-01 SURGERY — TURP (TRANSURETHRAL RESECTION OF PROSTATE)
Anesthesia: Spinal | Site: Ureter

## 2017-11-01 MED ORDER — MIDAZOLAM HCL 2 MG/2ML IJ SOLN
INTRAMUSCULAR | Status: AC
  Filled 2017-11-01: qty 2

## 2017-11-01 MED ORDER — FENTANYL CITRATE (PF) 100 MCG/2ML IJ SOLN
INTRAMUSCULAR | Status: AC
  Filled 2017-11-01: qty 2

## 2017-11-01 MED ORDER — SODIUM CHLORIDE 0.9 % IR SOLN: 3000.0000 mL | Status: DC

## 2017-11-01 MED ORDER — UMECLIDINIUM BROMIDE 62.5 MCG/INH IN AEPB
1.0000 | INHALATION_SPRAY | Freq: Every day | RESPIRATORY_TRACT | Status: DC
  Administered 2017-11-01 – 2017-11-02 (×2): 1 via RESPIRATORY_TRACT
  Filled 2017-11-01: qty 7

## 2017-11-01 MED ORDER — GENTAMICIN SULFATE 40 MG/ML IJ SOLN
5.0000 mg/kg | INTRAVENOUS | Status: AC
  Administered 2017-11-01: 280 mg via INTRAVENOUS
  Filled 2017-11-01: qty 7

## 2017-11-01 MED ORDER — BUPIVACAINE IN DEXTROSE 0.75-8.25 % IT SOLN
INTRATHECAL | Status: DC | PRN
  Administered 2017-11-01: 1.1 mL via INTRATHECAL

## 2017-11-01 MED ORDER — FLUTICASONE FUROATE-VILANTEROL 100-25 MCG/INH IN AEPB
1.0000 | INHALATION_SPRAY | Freq: Every day | RESPIRATORY_TRACT | Status: DC
  Administered 2017-11-01 – 2017-11-02 (×2): 1 via RESPIRATORY_TRACT
  Filled 2017-11-01: qty 28

## 2017-11-01 MED ORDER — DOCUSATE SODIUM 100 MG PO CAPS
100.0000 mg | ORAL_CAPSULE | Freq: Two times a day (BID) | ORAL | Status: DC
  Administered 2017-11-01 – 2017-11-02 (×2): 100 mg via ORAL
  Filled 2017-11-01 (×2): qty 1

## 2017-11-01 MED ORDER — HEPARIN SODIUM (PORCINE) 5000 UNIT/ML IJ SOLN
5000.0000 [IU] | Freq: Three times a day (TID) | INTRAMUSCULAR | Status: DC
  Administered 2017-11-02: 5000 [IU] via SUBCUTANEOUS
  Filled 2017-11-01: qty 1

## 2017-11-01 MED ORDER — ROCURONIUM BROMIDE 50 MG/5ML IV SOLN
INTRAVENOUS | Status: AC
  Filled 2017-11-01: qty 1

## 2017-11-01 MED ORDER — PHENYLEPHRINE HCL 10 MG/ML IJ SOLN
INTRAMUSCULAR | Status: DC | PRN
  Administered 2017-11-01: 200 ug via INTRAVENOUS
  Administered 2017-11-01: 100 ug via INTRAVENOUS
  Administered 2017-11-01: 50 ug via INTRAVENOUS
  Administered 2017-11-01: 100 ug via INTRAVENOUS
  Administered 2017-11-01: 50 ug via INTRAVENOUS
  Administered 2017-11-01: 100 ug via INTRAVENOUS

## 2017-11-01 MED ORDER — ACETAMINOPHEN 160 MG/5ML PO SOLN: 325.0000 mg | ORAL | Status: DC | PRN

## 2017-11-01 MED ORDER — PROPOFOL 10 MG/ML IV BOLUS
INTRAVENOUS | Status: AC
  Filled 2017-11-01: qty 20

## 2017-11-01 MED ORDER — MORPHINE SULFATE (PF) 2 MG/ML IV SOLN: 2.0000 mg | INTRAVENOUS | Status: DC | PRN

## 2017-11-01 MED ORDER — LIDOCAINE HCL (PF) 2 % IJ SOLN
INTRAMUSCULAR | Status: AC
  Filled 2017-11-01: qty 10

## 2017-11-01 MED ORDER — BELLADONNA ALKALOIDS-OPIUM 16.2-60 MG RE SUPP: 1.0000 | Freq: Four times a day (QID) | RECTAL | Status: DC | PRN

## 2017-11-01 MED ORDER — OXYBUTYNIN CHLORIDE 5 MG PO TABS: 5.0000 mg | ORAL_TABLET | Freq: Three times a day (TID) | ORAL | Status: DC | PRN

## 2017-11-01 MED ORDER — INFLUENZA VAC SPLIT HIGH-DOSE 0.5 ML IM SUSY
0.5000 mL | PREFILLED_SYRINGE | INTRAMUSCULAR | Status: DC
  Filled 2017-11-01: qty 0.5

## 2017-11-01 MED ORDER — ACETAMINOPHEN 325 MG PO TABS: 650.0000 mg | ORAL_TABLET | ORAL | Status: DC | PRN

## 2017-11-01 MED ORDER — PROPOFOL 500 MG/50ML IV EMUL
INTRAVENOUS | Status: AC
  Filled 2017-11-01: qty 50

## 2017-11-01 MED ORDER — EPHEDRINE SULFATE 50 MG/ML IJ SOLN
INTRAMUSCULAR | Status: AC
  Filled 2017-11-01: qty 1

## 2017-11-01 MED ORDER — FLUTICASONE-UMECLIDIN-VILANT 100-62.5-25 MCG/INH IN AEPB: 1.0000 | INHALATION_SPRAY | Freq: Every day | RESPIRATORY_TRACT | Status: DC

## 2017-11-01 MED ORDER — SODIUM CHLORIDE 0.9 % IV SOLN
INTRAVENOUS | Status: DC
  Administered 2017-11-01: 16:00:00 via INTRAVENOUS

## 2017-11-01 MED ORDER — TRAZODONE HCL 100 MG PO TABS
100.0000 mg | ORAL_TABLET | Freq: Every day | ORAL | Status: DC
  Administered 2017-11-01: 100 mg via ORAL
  Filled 2017-11-01: qty 1

## 2017-11-01 MED ORDER — FINASTERIDE 5 MG PO TABS
5.0000 mg | ORAL_TABLET | Freq: Every day | ORAL | Status: DC
  Administered 2017-11-01 – 2017-11-02 (×2): 5 mg via ORAL
  Filled 2017-11-01 (×2): qty 1

## 2017-11-01 MED ORDER — DIPHENHYDRAMINE HCL 50 MG/ML IJ SOLN: 12.5000 mg | Freq: Four times a day (QID) | INTRAMUSCULAR | Status: DC | PRN

## 2017-11-01 MED ORDER — FOLIC ACID 1 MG PO TABS
1.0000 mg | ORAL_TABLET | Freq: Every day | ORAL | Status: DC
  Administered 2017-11-01 – 2017-11-02 (×2): 1 mg via ORAL
  Filled 2017-11-01 (×2): qty 1

## 2017-11-01 MED ORDER — PROPOFOL 10 MG/ML IV BOLUS
INTRAVENOUS | Status: DC | PRN
  Administered 2017-11-01: 10 mg via INTRAVENOUS

## 2017-11-01 MED ORDER — MIDAZOLAM HCL 2 MG/2ML IJ SOLN
INTRAMUSCULAR | Status: DC | PRN
  Administered 2017-11-01: .5 mg via INTRAVENOUS

## 2017-11-01 MED ORDER — PROPOFOL 500 MG/50ML IV EMUL
INTRAVENOUS | Status: DC | PRN
  Administered 2017-11-01: 25 ug/kg/min via INTRAVENOUS

## 2017-11-01 MED ORDER — SODIUM CHLORIDE 0.9 % IV SOLN
1.0000 g | INTRAVENOUS | Status: DC
  Administered 2017-11-02: 1 g via INTRAVENOUS
  Filled 2017-11-01: qty 10

## 2017-11-01 MED ORDER — METHOTREXATE 2.5 MG PO TABS: 12.5000 mg | ORAL_TABLET | ORAL | Status: DC

## 2017-11-01 MED ORDER — PANTOPRAZOLE SODIUM 40 MG PO TBEC
80.0000 mg | DELAYED_RELEASE_TABLET | Freq: Every day | ORAL | Status: DC
  Administered 2017-11-01 – 2017-11-02 (×2): 80 mg via ORAL
  Filled 2017-11-01 (×2): qty 2

## 2017-11-01 MED ORDER — IOPAMIDOL (ISOVUE-M 200) INJECTION 41%
INTRAMUSCULAR | Status: DC | PRN
  Administered 2017-11-01: 15 mL

## 2017-11-01 MED ORDER — EPHEDRINE SULFATE 50 MG/ML IJ SOLN
INTRAMUSCULAR | Status: DC | PRN
  Administered 2017-11-01 (×2): 10 mg via INTRAVENOUS

## 2017-11-01 MED ORDER — LIDOCAINE HCL (CARDIAC) PF 100 MG/5ML IV SOSY
PREFILLED_SYRINGE | INTRAVENOUS | Status: DC | PRN
  Administered 2017-11-01: 100 mg via INTRAVENOUS

## 2017-11-01 MED ORDER — ACETAMINOPHEN 325 MG PO TABS: 325.0000 mg | ORAL_TABLET | ORAL | Status: DC | PRN

## 2017-11-01 MED ORDER — FENTANYL CITRATE (PF) 100 MCG/2ML IJ SOLN: 25.0000 ug | INTRAMUSCULAR | Status: DC | PRN

## 2017-11-01 MED ORDER — ONDANSETRON HCL 4 MG/2ML IJ SOLN: 4.0000 mg | INTRAMUSCULAR | Status: DC | PRN

## 2017-11-01 MED ORDER — HYDROCODONE-ACETAMINOPHEN 7.5-325 MG PO TABS: 1.0000 | ORAL_TABLET | Freq: Once | ORAL | Status: DC | PRN

## 2017-11-01 MED ORDER — OXYCODONE-ACETAMINOPHEN 5-325 MG PO TABS
1.0000 | ORAL_TABLET | ORAL | Status: DC | PRN
  Administered 2017-11-01 (×2): 1 via ORAL
  Filled 2017-11-01 (×2): qty 1

## 2017-11-01 MED ORDER — DIPHENHYDRAMINE HCL 12.5 MG/5ML PO ELIX
12.5000 mg | ORAL_SOLUTION | Freq: Four times a day (QID) | ORAL | Status: DC | PRN
  Filled 2017-11-01: qty 5

## 2017-11-01 MED ORDER — TAMSULOSIN HCL 0.4 MG PO CAPS
0.4000 mg | ORAL_CAPSULE | Freq: Every day | ORAL | Status: DC
Start: 2017-11-01 — End: 2017-11-02
  Administered 2017-11-01 – 2017-11-02 (×2): 0.4 mg via ORAL
  Filled 2017-11-01 (×2): qty 1

## 2017-11-01 MED ORDER — PROMETHAZINE HCL 25 MG/ML IJ SOLN: 6.2500 mg | INTRAMUSCULAR | Status: DC | PRN

## 2017-11-01 MED ORDER — SODIUM CHLORIDE 0.9 % IV SOLN
INTRAVENOUS | Status: DC
  Administered 2017-11-01: 11:00:00 via INTRAVENOUS

## 2017-11-01 SURGICAL SUPPLY — 33 items
ADAPTER IRRIG TUBE 2 SPIKE SOL (ADAPTER) ×8 IMPLANT
BAG DRAIN CYSTO-URO LG1000N (MISCELLANEOUS) ×4 IMPLANT
BAG URO DRAIN 4000ML (MISCELLANEOUS) ×4 IMPLANT
BRUSH SCRUB EZ  4% CHG (MISCELLANEOUS) ×2
BRUSH SCRUB EZ 4% CHG (MISCELLANEOUS) ×2 IMPLANT
CATH COUDE FOLEY 2W 5CC 20FR (CATHETERS) ×2 IMPLANT
CATH FOL 2WAY LX 24X30 (CATHETERS) IMPLANT
CATH URETL 5X70 OPEN END (CATHETERS) ×4 IMPLANT
DRAPE UTILITY 15X26 TOWEL STRL (DRAPES) ×4 IMPLANT
DRSG TELFA 3X8 NADH (GAUZE/BANDAGES/DRESSINGS) ×4 IMPLANT
ELECT LOOP 22F BIPOLAR SML (ELECTROSURGICAL)
ELECTRODE LOOP 22F BIPOLAR SML (ELECTROSURGICAL) ×2 IMPLANT
GLIDEWIRE STIFF .35X180X3 HYDR (WIRE) ×2 IMPLANT
GLOVE BIO SURGEON STRL SZ 6.5 (GLOVE) ×3 IMPLANT
GLOVE BIO SURGEONS STRL SZ 6.5 (GLOVE) ×1
GOWN STRL REUS W/ TWL LRG LVL3 (GOWN DISPOSABLE) ×4 IMPLANT
GOWN STRL REUS W/TWL LRG LVL3 (GOWN DISPOSABLE) ×4
HOLDER FOLEY CATH W/STRAP (MISCELLANEOUS) ×8 IMPLANT
KIT TURNOVER CYSTO (KITS) ×4 IMPLANT
LOOP CUT BIPOLAR 24F LRG (ELECTROSURGICAL) ×2 IMPLANT
PACK CYSTO AR (MISCELLANEOUS) ×4 IMPLANT
PAD DRESSING TELFA 3X8 NADH (GAUZE/BANDAGES/DRESSINGS) IMPLANT
SENSORWIRE 0.038 NOT ANGLED (WIRE) ×4
SET CYSTO W/LG BORE CLAMP LF (SET/KITS/TRAYS/PACK) ×2 IMPLANT
SET IRRIG Y TYPE TUR BLADDER L (SET/KITS/TRAYS/PACK) ×4 IMPLANT
SET IRRIGATING DISP (SET/KITS/TRAYS/PACK) ×4 IMPLANT
SOL .9 NS 3000ML IRR  AL (IV SOLUTION) ×12
SOL .9 NS 3000ML IRR UROMATIC (IV SOLUTION) ×12 IMPLANT
SURGILUBE 2OZ TUBE FLIPTOP (MISCELLANEOUS) ×4 IMPLANT
SYR 10ML LL (SYRINGE) ×2 IMPLANT
SYRINGE IRR TOOMEY STRL 70CC (SYRINGE) ×4 IMPLANT
WATER STERILE IRR 1000ML POUR (IV SOLUTION) ×4 IMPLANT
WIRE SENSOR 0.038 NOT ANGLED (WIRE) ×2 IMPLANT

## 2017-11-01 NOTE — Anesthesia Post-op Follow-up Note (Signed)
Anesthesia QCDR form completed.        

## 2017-11-01 NOTE — Anesthesia Preprocedure Evaluation (Signed)
Anesthesia Evaluation  Patient identified by MRN, date of birth, ID band Patient awake    Reviewed: Allergy & Precautions, H&P , NPO status , reviewed documented beta blocker date and time   Airway Mallampati: II  TM Distance: >3 FB Neck ROM: full    Dental  (+) Edentulous Upper, Edentulous Lower   Pulmonary COPD,  COPD inhaler, former smoker,     + decreased breath sounds      Cardiovascular Normal cardiovascular exam     Neuro/Psych CVA    GI/Hepatic GERD  Medicated and Controlled,  Endo/Other    Renal/GU      Musculoskeletal  (+) Arthritis ,   Abdominal   Peds  Hematology  (+) anemia ,   Anesthesia Other Findings Past Medical History: No date: Arthritis No date: Collagen vascular disease (HCC)     Comment:  rheumatoid arthritis No date: COPD (chronic obstructive pulmonary disease) (HCC) No date: GERD (gastroesophageal reflux disease) No date: History of prostate cancer No date: HLD (hyperlipidemia) No date: HOH (hard of hearing) No date: Insomnia 2017: Lung cancer ([place])     Comment:  rad tx's. No date: Oxygen deficit     Comment:  2L HS AND PRN 2006: Prostate cancer ([place])     Comment:  Rad seed tx's. No date: Stroke [facility])     Comment:  2000  Past Surgical History: 1965: APPENDECTOMY 01/05/2016: CATARACT EXTRACTION W/PHACO; Right     Comment:  Procedure: CATARACT EXTRACTION PHACO AND INTRAOCULAR               LENS PLACEMENT (IOC);  Surgeon: [name], MD;                Location: [facility] ORS;  Service: Ophthalmology;  Laterality:              Right;  [place] 1.14 AP% 18.1 CDE 13.44 FLUID PACK LOT #               [id number] 02/26/2015: ENDOBRONCHIAL ULTRASOUND; N/A     Comment:  Procedure: ENDOBRONCHIAL ULTRASOUND;  Surgeon: Flora Lipps, MD;  Location: [facility] ORS;  Service: Cardiopulmonary;              Laterality: N/A; 1965: HERNIA REPAIR 2002: INSERTION PROSTATE RADIATION SEED  BMI     Body Mass Index:  17.64 kg/m      Reproductive/Obstetrics                             Anesthesia Physical Anesthesia Plan  ASA: III  Anesthesia Plan: Spinal   Post-op Pain Management:    Induction: Intravenous  PONV Risk Score and Plan: 2 and Treatment may vary due to age or medical condition, TIVA and Ondansetron  Airway Management Planned: Nasal Cannula and Natural Airway  Additional Equipment:   Intra-op Plan:   Post-operative Plan:   Informed Consent: I have reviewed the patients History and Physical, chart, labs and discussed the procedure including the risks, benefits and alternatives for the proposed anesthesia with the patient or authorized representative who has indicated his/her understanding and acceptance.   Dental Advisory Given  Plan Discussed with: CRNA  Anesthesia Plan Comments:         Anesthesia Quick Evaluation

## 2017-11-01 NOTE — Anesthesia Procedure Notes (Signed)
Spinal  Start time: 11/01/2017 12:56 PM End time: 11/01/2017 12:58 PM Staffing Anesthesiologist: Alphonsus Sias, MD Performed: anesthesiologist  Preanesthetic Checklist Completed: patient identified, site marked, surgical consent, pre-op evaluation, timeout performed, IV checked, risks and benefits discussed and monitors and equipment checked Spinal Block Patient position: sitting Prep: ChloraPrep Patient monitoring: heart rate, cardiac monitor, continuous pulse ox and blood pressure Approach: right paramedian Location: L4-5 Injection technique: single-shot Needle Needle type: Pencil-Tip  Needle gauge: 24 G Needle length: 10 cm Needle insertion depth: 4 cm Additional Notes Easy placement, tol well

## 2017-11-01 NOTE — OR Nursing (Signed)
Notified pathology ([facility].) that seeds may be present in tissue being sent down. Chrys Racer from [facility] present in room and states they are not radioactive. Seeds that were found were given to [place].

## 2017-11-01 NOTE — Transfer of Care (Signed)
Immediate Anesthesia Transfer of Care Note  Patient: Collin Gonzales  Procedure(s) Performed: TRANSURETHRAL RESECTION OF THE PROSTATE (TURP) (channel TURP) (N/A Prostate) CYSTOSCOPY WITH RETROGRADE PYELOGRAM (Bilateral Ureter)  Patient Location: PACU  Anesthesia Type:Spinal  Level of Consciousness: awake, alert  and oriented  Airway & Oxygen Therapy: Patient Spontanous Breathing and Patient connected to nasal cannula oxygen  Post-op Assessment: Report given to RN and Post -op Vital signs reviewed and stable  Post vital signs: Reviewed and stable  Last Vitals:  Vitals Value Taken Time  BP 104/67 11/01/2017  2:46 PM  Temp    Pulse 64 11/01/2017  2:47 PM  Resp 24 11/01/2017  2:47 PM  SpO2 100 % 11/01/2017  2:47 PM  Vitals shown include unvalidated device data.  Last Pain:  Vitals:   11/01/17 1442  TempSrc:   PainSc: (P) 0-No pain         Complications: No apparent anesthesia complications

## 2017-11-01 NOTE — Op Note (Signed)
Date of procedure: 11/01/17  Preoperative diagnosis:  1. History of prostate cancer status post brachii therapy 2. Urinary retention 3. Prostatic fossa necrosis  Postoperative diagnosis:  1. Same as above  Procedure: 1. Cystoscopy 2. Bilateral retrograde pyelogram 3. TURP  Surgeon: Hollice Espy, MD  Anesthesia: General  Complications: None  Intraoperative findings: Significant J hooking of the distal ureter, but otherwise unremarkable retrograde pyelogram.  Catheter cystitis.  Irregular shaggy necrotic contracted bladder neck/prostatic fossa.  Several brachytherapy seeds unproved.  EBL: Minimal  Specimens: Prostate chips  Drains: 20 French coud catheter, two-way  Indication: Collin Gonzales is a 82 y.o. patient with history of prostate cancer status post brachy therapy with urinary retention and significantly irregular necrotic prostatic fossa.  After reviewing the management options for treatment, he elected to proceed with the above surgical procedure(s). We have discussed the potential benefits and risks of the procedure, side effects of the proposed treatment, the likelihood of the patient achieving the goals of the procedure, and any potential problems that might occur during the procedure or recuperation. Informed consent has been obtained.  Description of procedure:  The patient was taken to the operating room and general anesthesia was induced.  The patient was placed in the dorsal lithotomy position, prepped and draped in the usual sterile fashion, and preoperative antibiotics were administered. A preoperative time-out was performed.   A 20 French cystoscope was advanced per urethra into the bladder.  Upon advancing the scope through the prostatic fossa, the bladder neck was noted to be relatively fixed with a shaggy necrotic irregular prostatic fossa with narrowing at the bladder neck.  There is some bleeding with manipulation noted.  The prostatic fossa was grossly  abnormal without any recognizable anatomy.  Large amount of debris was dislodged with manipulation of the scope alone.  The bladder itself was then surveyed and noted to be moderately trabeculated with a large widemouth diverticulum just lateral to the left UO.  There is some mild catheter cystitis at the dome otherwise the bladder was without any obvious tumors or lesions.  The trigone was somewhat distorted but ultimately I was able to identify the ureteral orifice ease.  Gentle retrograde pyelogram was performed on the left side revealing significant J hooking of the left distal ureter, otherwise there is no hydronephrosis or filling defects appreciated.  See procedure was performed on the right with less J hooking of the ureter but was otherwise unremarkable without hydronephrosis or filling defects.  , Next a bipolar loop was used with saline is a medium to start resecting around the bladder neck down to level bladder neck fibers circumferentially.  A few seeds were unroofed and sent off to the cancer center with a physicist.  A channel was opened up to level of the apex of the prostate with care taken to avoid any resection distally.  The verumontanum and other anatomic structures were unidentifiable thus limited resection was performed at the apex due to concern for urinary incontinence.  After appropriate size channel was opened the prostatic fossa, the bladder was irrigated several times until the prostatic chips were cleared.  Hemostasis was achieved using the loop.  Hemostasis at this point was excellent.  There is [place] no bleeding noted.  As such, the decision was made to place a two-way coud catheter rather than initiate CBI given no active bleeding.  Just prior to placing the catheter, the patient started to cough and there was an excellent urinary stream.  Once a catheter was  placed, there is minimal to no urine output which is reassuring that he was able to adequately empty his bladder at the  end of the procedure spontaneously.  The patient was then clean and dry, repositioned the supine position, reversed anesthesia, and taken to the PACU in stable condition.    Plan: Patient will be admitted overnight for observation labs in the morning.  We will plan for voiding trial in the morning.  Hollice Espy, M.D.

## 2017-11-01 NOTE — Interval H&P Note (Signed)
History and Physical Interval Note:  11/01/2017 12:32 PM  Collin Gonzales  has presented today for surgery, with the diagnosis of urinary retention  The various methods of treatment have been discussed with the patient and family. After consideration of risks, benefits and other options for treatment, the patient has consented to  Procedure(s): TRANSURETHRAL RESECTION OF THE PROSTATE (TURP) (channel TURP) (N/A) CYSTOSCOPY WITH RETROGRADE PYELOGRAM (Bilateral) as a surgical intervention .  The patient's history has been reviewed, patient examined, no change in status, stable for surgery.  I have reviewed the patient's chart and labs.  Questions were answered to the patient's satisfaction.    RRR CTAB  Hollice Espy

## 2017-11-02 DIAGNOSIS — C61 Malignant neoplasm of prostate: Secondary | ICD-10-CM | POA: Diagnosis not present

## 2017-11-02 LAB — BASIC METABOLIC PANEL
Anion gap: 3 — ABNORMAL LOW (ref 5–15)
BUN: 19 mg/dL (ref 8–23)
CALCIUM: 7.8 mg/dL — AB (ref 8.9–10.3)
CHLORIDE: 109 mmol/L (ref 98–111)
CO2: 26 mmol/L (ref 22–32)
CREATININE: 1.04 mg/dL (ref 0.61–1.24)
Glucose, Bld: 92 mg/dL (ref 70–99)
Potassium: 3.6 mmol/L (ref 3.5–5.1)
SODIUM: 138 mmol/L (ref 135–145)

## 2017-11-02 LAB — CBC
HCT: 26.7 % — ABNORMAL LOW (ref 40.0–52.0)
HEMOGLOBIN: 9.1 g/dL — AB (ref 13.0–18.0)
MCH: 32.8 pg (ref 26.0–34.0)
MCHC: 34 g/dL (ref 32.0–36.0)
MCV: 96.4 fL (ref 80.0–100.0)
PLATELETS: 125 10*3/uL — AB (ref 150–440)
RBC: 2.77 MIL/uL — AB (ref 4.40–5.90)
RDW: 15.2 % — ABNORMAL HIGH (ref 11.5–14.5)
WBC: 3.8 10*3/uL (ref 3.8–10.6)

## 2017-11-02 NOTE — Discharge Summary (Signed)
Date of admission: 11/01/2017  Date of discharge: 11/02/2017  Admission diagnosis: History of prostate cancer, urinary retention  Discharge diagnosis: Same as above  Secondary diagnoses:  Patient Active Problem List   Diagnosis Date Noted  . Urinary retention 11/01/2017  . Dependence on supplemental oxygen 04/24/2017  . Malnutrition ([place]) 04/24/2017  . Hemiparesis affecting left side as late effect of cerebrovascular accident (CVA) ([place]) 04/24/2017  . Cataract 09/06/2016  . Drug-induced folate deficiency anemia 09/06/2016  . Esophagitis, reflux 09/06/2016  . Hypogammaglobulinemia ([place]) 09/06/2016  . Iron deficiency anemia due to chronic blood loss 08/23/2016  . Pancytopenia ([place]) 08/22/2016  . History of stroke with residual effects 06/06/2016  . Decrease in appetite 09/01/2015  . GERD (gastroesophageal reflux disease) 08/05/2015  . COPD (chronic obstructive pulmonary disease) ([place]) 08/20/2014  . Dyslipidemia 08/20/2014  . Chronic insomnia 08/20/2014  . Lung cancer ([place]) 08/20/2014  . Arthritis or polyarthritis, rheumatoid ([place]) 08/20/2014  . Prostate cancer ([place]) 11/01/2013  . Flutter-fibrillation 03/18/2005    History and Physical: For full details, please see admission history and physical. Briefly, Collin Gonzales is a 82 y.o. year old patient with urinary retention who underwent cystoscopy, bilateral retrograde Polygram and TURP admitted for postoperative observation.   Hospital Course: Patient tolerated the procedure well.  He was then transferred to the floor after an uneventful PACU stay.  His hospital course was uncomplicated.  On POD#1 he had met discharge criteria: was eating a regular diet, was up and ambulating independently,  pain was well controlled, was voiding without a catheter, and was ready to for discharge.  Physical Exam  Constitutional: He is oriented to person, place, and time. He appears well-developed.  HENT:  Head: Normocephalic and atraumatic.   Pulmonary/Chest: Effort normal. No respiratory distress.  Wearing O2  Abdominal: Soft. Bowel sounds are normal.  Genitourinary: Penis normal.  Genitourinary Comments: Foley draining clear yellow urine prior to removal  Neurological: He is alert and oriented to person, place, and time.  Skin: Skin is warm and dry.  Vitals reviewed.     Laboratory values:  Recent Labs    11/02/17 0341  WBC 3.8  HGB 9.1*  HCT 26.7*   Recent Labs    11/02/17 0341  NA 138  K 3.6  CL 109  CO2 26  GLUCOSE 92  BUN 19  CREATININE 1.04  CALCIUM 7.8*   No results for input(s): LABPT, INR in the last 72 hours. No results for input(s): LABURIN in the last 72 hours. Results for orders placed or performed in visit on 10/18/17  Microscopic Examination     Status: Abnormal   Collection Time: 10/18/17 10:47 AM  Result Value Ref Range Status   WBC, UA 11-30 (A) 0 - 5 /hpf Final   RBC, UA 11-30 (A) 0 - 2 /hpf Final   Epithelial Cells (non renal) 0-10 0 - 10 /hpf Final   Mucus, UA Present (A) Not Estab. Final   Bacteria, UA Many (A) None seen/Few Final  CULTURE, URINE COMPREHENSIVE     Status: Abnormal   Collection Time: 10/18/17 11:10 AM  Result Value Ref Range Status   Urine Culture, Comprehensive Final report (A)  Final   Organism ID, Bacteria Escherichia coli (A)  Final    Comment: Greater than 100,000 colony forming units per mL   ANTIMICROBIAL SUSCEPTIBILITY Comment  Final    Comment:       ** S = Susceptible; I = Intermediate; R = Resistant **  P = Positive; N = Negative             MICS are expressed in micrograms per mL    Antibiotic                 RSLT#1    RSLT#2    RSLT#3    RSLT#4 Amoxicillin/Clavulanic Acid    I Ampicillin                     R Cefepime                       S Ceftriaxone                    S Cefuroxime                     I Ciprofloxacin                  R Ertapenem                      S Gentamicin                     S Imipenem                        S Levofloxacin                   R Meropenem                      S Nitrofurantoin                 R Piperacillin/Tazobactam        S Tetracycline                   R Tobramycin                     S Trimethoprim/Sulfa             R     Disposition: Home  Discharge instruction: See post TURP sheet  Discharge medications:  Allergies as of 11/02/2017      Reactions   Plaquenil [hydroxychloroquine]    Bad dreams   Simvastatin Other (See Comments)   Bad dreams      Medication List    TAKE these medications   B-complex with vitamin C tablet Take 1 tablet by mouth daily.   finasteride 5 MG tablet Commonly known as:  PROSCAR Take 1 tablet (5 mg total) by mouth daily.   Fluticasone-Umeclidin-Vilant 100-62.5-25 MCG/INH Aepb Inhale 1 puff into the lungs daily. In place of Advair and Spiriva   folic acid 1 MG tablet Commonly known as:  FOLVITE Take 1 tablet (1 mg total) by mouth daily.   methotrexate 2.5 MG tablet Commonly known as:  RHEUMATREX Take 5 tablets by mouth once a week.   omeprazole 40 MG capsule Commonly known as:  PRILOSEC Take 1 capsule (40 mg total) by mouth daily.   OXYGEN Place 2 L into the nose daily as needed.   tamsulosin 0.4 MG Caps capsule Commonly known as:  FLOMAX Take 1 capsule (0.4 mg total) by mouth daily. What changed:  how much to take   traZODone 100 MG tablet Commonly known as:  DESYREL TAKE 1 TABLET BY MOUTH EVERYDAY AT BEDTIME  Followup:  Follow-up Information    [name] A, PA-C In 4 weeks.   Specialties:  Urology, Radiology Why:  post op Contact information: [address] [place]  [id number] [phone]

## 2017-11-02 NOTE — Progress Notes (Signed)
Discharge order received. Patient is alert and oriented. Vital signs stable . No signs of acute distress. Discharge instructions given. Patient verbalized understanding. No other issues noted at this time.   

## 2017-11-02 NOTE — Care Management Obs Status (Signed)
[facility] NOTIFICATION   Patient Details  Name: Collin Gonzales MRN: [id number] Date of Birth: [birth year]   Medicare Observation Status Notification Given:  No(admitted obs less thand 24 hours)    Beverly Sessions, RN 11/02/2017, 11:21 AM

## 2017-11-02 NOTE — Discharge Instructions (Signed)
Transurethral Resection of the Prostate, Care After °Refer to this sheet in the next few weeks. These instructions provide you with information about caring for yourself after your procedure. Your health care provider may also give you more specific instructions. Your treatment has been planned according to current medical practices, but problems sometimes occur. Call your health care provider if you have any problems or questions after your procedure. °What can I expect after the procedure? °After the procedure, it is common to have: °· Mild pain in your lower abdomen. °· Soreness or mild discomfort in your penis from having the catheter inserted during the procedure. °· A feeling of urgency when you need to urinate. °· A small amount of blood in your urine. You may notice some small blood clots in your urine. These are normal. ° °Follow these instructions at home: °Medicines ° °· Take over-the-counter and prescription medicines only as told by your health care provider. °· Do not drive or operate heavy machinery while taking prescription pain medicine. °· Do not drive for 24 hours if you received a sedative. °· If you were prescribed antibiotic medicine, take it as told by your health care provider. Do not stop taking the antibiotic even if you start to feel better. °Activity °· Return to your normal activities as told by your health care provider. Ask your health care provider what activities are safe for you. °· Do not lift anything that is heavier than 10 lb (4.5 kg) for 3 weeks after your procedure, or as long as told by your health care provider. °· Avoid intense physical activity for as long as told by your health care provider. °· Walk at least one time every day. This helps to prevent blood clots. You may increase your physical activity gradually as you start to feel better. °Lifestyle °· Do not drink alcohol for as long as told by your health care provider. This is especially important if you are taking  prescription pain medicines. °· Do not engage in sexual activity until your health care provider says that you can do this. °General instructions °· Do not take baths, swim, or use a hot tub until your health care provider approves. °· Drink enough fluid to keep your urine clear or pale yellow. °· Urinate as soon as you feel the need to. Do not try to hold your urine for long periods of time. °· If your health care provider approves, you may take a stool softener for 2-3 weeks to prevent you from straining to have a bowel movement. °· Wear compression stockings as told by your health care provider. These stockings help to prevent blood clots and reduce swelling in your legs. °· Keep all follow-up visits as told by your health care provider. This is important. °Contact a health care provider if: °· You have difficulty urinating. °· You have a fever. °· You have pain that gets worse or does not improve with medicine. °· You have blood in your urine that does not go away after 1 week of resting and drinking more fluids. °· You have swelling in your penis or testicles. °Get help right away if: °· You are unable to urinate. °· You are having more blood clots in your urine instead of fewer. °· You have: °? Large blood clots. °? A lot of blood in your urine. °? Pain in your back or lower abdomen. °? Pain or swelling in your legs. °? Chills and you are shaking. °This information is not intended to   replace advice given to you by your health care provider. Make sure you discuss any questions you have with your health care provider. °Document Released: 02/07/2005 Document Revised: 10/11/2015 Document Reviewed: 10/30/2014 °Elsevier Interactive Patient Education © 2017 [employer] ° °

## 2017-11-03 LAB — SURGICAL PATHOLOGY

## 2017-11-06 ENCOUNTER — Ambulatory Visit (INDEPENDENT_AMBULATORY_CARE_PROVIDER_SITE_OTHER): Payer: Medicare HMO

## 2017-11-06 ENCOUNTER — Telehealth: Payer: Self-pay | Admitting: Urology

## 2017-11-06 DIAGNOSIS — R339 Retention of urine, unspecified: Secondary | ICD-10-CM

## 2017-11-06 NOTE — Progress Notes (Signed)
Bladder Scan Patient cannot void: >600 ml Performed By: Cristie Hem, CMA  Simple Catheter Placement  Due to urinary retention patient is present today for a foley cath placement.  Patient was cleaned and prepped in a sterile fashion with betadine and lidocaine jelly 2% was instilled into the urethra. On attempt to cath, I was unable to successfully penetrate the bladder, meeting resistance. Dr. Diamantina Providence was advised and was able to successfully cath pt noting a "catch" when entering the bladder. A 18 FR coude catheter was inserted, urine return was noted  66ml, urine was yellow in color.  The balloon was filled with 10cc of sterile water.  A leg bag was attached for drainage. Patient was also given a night bag to take home and was given instruction on how to change from one bag to another.  Patient was given instruction on proper catheter care.    Preformed by: Cristie Hem, CMA and Dr. Nickolas Madrid.    Additional notes/ Follow up: Follow-up with Dr. Erlene Quan as scheduled.

## 2017-11-06 NOTE — Telephone Encounter (Signed)
Pt called office stating that he is unable to urinate all morning, stomach feels tight, no pain, tried to get pt to come on into the office, pt did not want to come, states he will come in later if he is unable to urinate, I made him an appt for 2:15 today, tried to get pt to come on in, pt insisted to wait until later and then stated he will call if he is able to urinate before appt time. FYI

## 2017-11-07 ENCOUNTER — Telehealth: Payer: Self-pay | Admitting: Urology

## 2017-11-07 NOTE — Telephone Encounter (Signed)
Patient had a catheter placed on 11/06/2017.  He called today with a complaint of the catheter leaking.  He can be reached at [phone].

## 2017-11-07 NOTE — Anesthesia Postprocedure Evaluation (Signed)
Anesthesia Post Note  Patient: Collin Gonzales  Procedure(s) Performed: TRANSURETHRAL RESECTION OF THE PROSTATE (TURP) (channel TURP) (N/A Prostate) CYSTOSCOPY WITH RETROGRADE PYELOGRAM (Bilateral Ureter)  Patient location during evaluation: PACU Anesthesia Type: Spinal Level of consciousness: awake and alert Pain management: pain level controlled Vital Signs Assessment: post-procedure vital signs reviewed and stable Respiratory status: spontaneous breathing, nonlabored ventilation, respiratory function stable and patient connected to nasal cannula oxygen Cardiovascular status: blood pressure returned to baseline and stable Postop Assessment: no apparent nausea or vomiting Anesthetic complications: no     Last Vitals:  Vitals:   11/01/17 2037 11/02/17 0548  BP:  118/67  Pulse: 73 66  Resp:  20  Temp:  36.7 C  SpO2: 97% 97%    Last Pain:  Vitals:   11/02/17 0548  TempSrc: Oral  PainSc:                  Alphonsus Sias

## 2017-11-08 ENCOUNTER — Ambulatory Visit: Payer: Medicare HMO | Admitting: Urology

## 2017-11-08 NOTE — Telephone Encounter (Signed)
Spoke with patient and explained that what he was experiencing was bladder spasms and as long as there is urine going into the bag it is ok. Patient states that he is also passing "seeds" in his stool and thinks that this may be the radiation seeds placed for his cancer. Should anything be done in regards to this? Please advise?

## 2017-11-08 NOTE — Telephone Encounter (Signed)
Patient notified, please double book for Friday at 8:30

## 2017-11-08 NOTE — Telephone Encounter (Signed)
I need to see this patient Friday to discuss his pathology results.  Please double book him for Friday morning at 830.  We will address his clinical issues as well at this point time.  In terms of passing "seeds" in his stool, this would be highly unusual but very concerning.  Please have him save any "seed" in a baggy and bring it to the office for me to look at.  Hollice Espy, MD

## 2017-11-09 NOTE — Telephone Encounter (Signed)
Patient has been notified  Collin Gonzales

## 2017-11-10 ENCOUNTER — Ambulatory Visit: Payer: Medicare HMO | Admitting: Urology

## 2017-11-10 ENCOUNTER — Encounter: Payer: Self-pay | Admitting: Urology

## 2017-11-10 ENCOUNTER — Other Ambulatory Visit: Payer: Self-pay | Admitting: Family Medicine

## 2017-11-10 VITALS — BP 104/61 | HR 74 | Ht 68.0 in | Wt 116.8 lb

## 2017-11-10 DIAGNOSIS — C675 Malignant neoplasm of bladder neck: Secondary | ICD-10-CM

## 2017-11-10 DIAGNOSIS — Z8546 Personal history of malignant neoplasm of prostate: Secondary | ICD-10-CM | POA: Diagnosis not present

## 2017-11-10 DIAGNOSIS — C61 Malignant neoplasm of prostate: Secondary | ICD-10-CM

## 2017-11-10 DIAGNOSIS — R339 Retention of urine, unspecified: Secondary | ICD-10-CM | POA: Diagnosis not present

## 2017-11-10 NOTE — Progress Notes (Signed)
11/10/2017 8:57 AM   Collin Gonzales 11-May-1935 [id number]  Referring provider: [name], MD [address] [place], [place] [address]  Chief Complaint  Patient presents with  . Urinary Retention    HPI: 82 yo M followed by urology for recurrent UTIs and urinary retention as well as a personal history of prostate cancer status post brachytherapy in 2006 who recently underwent TURP due to suspicious prostatic findings on cystoscopy after difficult Foley insertion.  Unfortunately, surgical pathology shows high grave invasive urothelial carcinoma involving the prostate with squamous differentiation, focal keratinization and extensive necrosis.  Perineural invasion was present.  Postoperatively, he was able to void spontaneously until earlier this week when he could no longer urinate and required Foley catheter replacement.  Normal LFTs.  Normal alk phos.  No recent cross-sectional abdominal or pelvic imaging.    Chest CT 3 months ago shows interval increased size of posterior right upper pole "band like" nodule measuring 1.4 x 2.3 which is felt to be most compatible with a benign process with multiple other nodules and scarring along with emphysematous changes.  He has lost 25 lbs over the past 4 years without trying.  He does live alone and does most of his own ADLs.    He has multiple medical comorbidities.  He has a personal history of COPD on 02, lung cancer, and prostate cancer as well as history of stroke amongst others.   PMH: Past Medical History:  Diagnosis Date  . Arthritis   . Collagen vascular disease (HCC)    rheumatoid arthritis  . COPD (chronic obstructive pulmonary disease) ([place])   . GERD (gastroesophageal reflux disease)   . History of prostate cancer   . HLD (hyperlipidemia)   . HOH (hard of hearing)   . Insomnia   . Lung cancer ([place]) 2017   rad tx's.  . Oxygen deficit    2L HS AND PRN  . Prostate cancer ([place]) 2006   Rad seed tx's.    . Stroke [facility])    2000    Surgical History: Past Surgical History:  Procedure Laterality Date  . APPENDECTOMY  1965  . CATARACT EXTRACTION W/PHACO Right 01/05/2016   Procedure: CATARACT EXTRACTION PHACO AND INTRAOCULAR LENS PLACEMENT (IOC);  Surgeon: [name], MD;  Location: [facility] ORS;  Service: Ophthalmology;  Laterality: Right;  [place] 1.14 AP% 18.1 CDE 13.44 FLUID PACK LOT # [id number] H  . CYSTOSCOPY W/ RETROGRADES Bilateral 11/01/2017   Procedure: CYSTOSCOPY WITH RETROGRADE PYELOGRAM;  Surgeon: Hollice Espy, MD;  Location: [facility] ORS;  Service: Urology;  Laterality: Bilateral;  . ENDOBRONCHIAL ULTRASOUND N/A 02/26/2015   Procedure: ENDOBRONCHIAL ULTRASOUND;  Surgeon: Flora Lipps, MD;  Location: [facility] ORS;  Service: Cardiopulmonary;  Laterality: N/A;  . [place]  . INSERTION PROSTATE RADIATION SEED  2002  . TRANSURETHRAL RESECTION OF PROSTATE N/A 11/01/2017   Procedure: TRANSURETHRAL RESECTION OF THE PROSTATE (TURP) (channel TURP);  Surgeon: Hollice Espy, MD;  Location: [facility] ORS;  Service: Urology;  Laterality: N/A;    Home Medications:  Allergies as of 11/10/2017      Reactions   Plaquenil [hydroxychloroquine]    Bad dreams   Simvastatin Other (See Comments)   Bad dreams      Medication List        Accurate as of 11/10/17  8:57 AM. Always use your most recent med list.          folic acid 1 MG tablet Commonly known as:  FOLVITE Take 1  tablet (1 mg total) by mouth daily.   methotrexate 2.5 MG tablet Commonly known as:  RHEUMATREX Take 5 tablets by mouth once a week.   omeprazole 40 MG capsule Commonly known as:  PRILOSEC Take 1 capsule (40 mg total) by mouth daily.   OXYGEN Place 2 L into the nose daily as needed.   tamsulosin 0.4 MG Caps capsule Commonly known as:  FLOMAX Take 1 capsule (0.4 mg total) by mouth daily.   traZODone 100 MG tablet Commonly known as:  DESYREL TAKE 1 TABLET BY MOUTH EVERYDAY AT BEDTIME       Allergies:   Allergies  Allergen Reactions  . Plaquenil [Hydroxychloroquine]     Bad dreams  . Simvastatin Other (See Comments)    Bad dreams    Family History: Family History  Problem Relation Age of Onset  . Cancer Mother   . Cancer Father        Lung Cancer  . Cancer Sister        breast  . Cancer Brother        stomach    Social History:  reports that he quit smoking about 4 years ago. His smoking use included cigarettes. He has a 25.00 pack-year smoking history. He has never used smokeless tobacco. He reports that he does not drink alcohol or use drugs.  ROS: UROLOGY Frequent Urination?: Yes Hard to postpone urination?: Yes Burning/pain with urination?: Yes Get up at night to urinate?: Yes Leakage of urine?: Yes Urine stream starts and stops?: No Trouble starting stream?: No Do you have to strain to urinate?: No Blood in urine?: No Urinary tract infection?: No Sexually transmitted disease?: No Injury to kidneys or bladder?: No Painful intercourse?: No Weak stream?: No Erection problems?: No Penile pain?: No  Gastrointestinal Nausea?: No Vomiting?: No Indigestion/heartburn?: No Diarrhea?: No Constipation?: No  Constitutional Fever: No Night sweats?: No Weight loss?: Yes Fatigue?: No  Skin Skin rash/lesions?: No Itching?: No  Eyes Blurred vision?: No Double vision?: No  Ears/Nose/Throat Sore throat?: No Sinus problems?: No  Hematologic/Lymphatic Swollen glands?: No Easy bruising?: No  Cardiovascular Leg swelling?: No Chest pain?: No  Respiratory Cough?: No Shortness of breath?: No  Endocrine Excessive thirst?: No  Musculoskeletal Back pain?: No Joint pain?: No  Neurological Headaches?: No Dizziness?: No  Psychologic Depression?: No Anxiety?: No  Physical Exam: BP 104/61 (BP Location: Left Arm, Patient Position: Sitting, Cuff Size: Normal)   Pulse 74   Ht _0  (1.727 m)   Wt 116 lb 12.8 oz (53 kg)   BMI 17.76 kg/m    Constitutional:  Alert and oriented, No acute distress.  Wearing O2.  Slightly cachectic appearing. HEENT: [place] AT, moist mucus membranes.  Trachea midline, no masses. Cardiovascular: No clubbing, cyanosis, or edema. Respiratory: Normal respiratory effort, no increased work of breathing. Skin: No rashes, bruises or suspicious lesions. Neurologic: Grossly intact, no focal deficits, moving all 4 extremities. Psychiatric: Normal mood and affect.  Laboratory Data: Lab Results  Component Value Date   WBC 3.8 11/02/2017   HGB 9.1 (L) 11/02/2017   HCT 26.7 (L) 11/02/2017   MCV 96.4 11/02/2017   PLT 125 (L) 11/02/2017    Lab Results  Component Value Date   CREATININE 1.04 11/02/2017   Urinalysis N/a  Pertinent Imaging: CT chest 07/2017 reviewed  Assessment & Plan:    1. Malignant neoplasm of urinary bladder neck (HCC) Newly diagnosed high-grade invasive urothelial carcinoma involving the prostate with squamous differentiation Pathology was discussed with  the patient at length Recommend further staging with CT abdomen pelvis with contrast, will defer repeat chest imaging at this time given that he had a CT of the chest just 3 months ago Refer the patient back to Dr. [name] who followed him for his lung cancer We discussed that options for this prostate cancer will be somewhat limited as I suspect he will have a difficulty time tolerating chemotherapy, may or may not be a candidate for further pelvic radiation in light of his history of prostate brachytherapy, and is a relatively poor surgical candidate for cystoprostatectomy given his comorbidities Plan to present the patient at multidisciplinary tumor board next week to continue the discussion on how to best manage this complex patient - Ambulatory referral to Oncology - CT Abdomen Pelvis W Contrast; Future  2. Urinary retention Maintain Foley catheter  3. History of prostate cancer Recent PSA undetectable No evidence of  disease   Hollice Espy, MD  [facility] [address] [place], [place] [address] [phone]  I spent 25 min with this patient of which greater than 50% was spent in counseling and coordination of care with the patient.

## 2017-11-12 DIAGNOSIS — C679 Malignant neoplasm of bladder, unspecified: Secondary | ICD-10-CM | POA: Insufficient documentation

## 2017-11-12 NOTE — Progress Notes (Signed)
[facility]  Telephone:([phone] Fax:([phone]) [phone]  ID: Collin Gonzales OB: [birth year]  MR#: [id number]  LZJ#:[id number]  Patient Care Team: [name], MD as PCP - General (Family Medicine) [name], MD as Referring Physician (Ophthalmology) [name], MD as Consulting Physician (Rheumatology) [name], MD as Referring Physician (Specialist) [name], MD as Consulting Physician (Urology) [name], MD as Referring Physician (Radiation Oncology)  CHIEF COMPLAINT: Locally advanced high-grade urothelial carcinoma.    INTERVAL HISTORY: Patient last evaluated greater than 2 years ago.  He was recently diagnosed with high-grade urothelial carcinoma and determined not to be a surgical candidate.  Given his history of prostate cancer brachii therapy seeds, he is not a candidate for XRT either.  Currently, patient feels well and is at his baseline.  He has no neurologic complaints.  He denies any recent fevers or illnesses.  He has chronic shortness of breath, but denies any chest pain, hemoptysis, or cough.  He has no nausea, vomiting, constipation, or diarrhea.  He has no urinary complaints today.  Patient offers no further specific complaints today.    REVIEW OF SYSTEMS:   Review of Systems  Constitutional: Negative.  Negative for fever, malaise/fatigue and weight loss.  Respiratory: Positive for shortness of breath. Negative for cough and hemoptysis.   Cardiovascular: Negative.  Negative for chest pain and leg swelling.  Gastrointestinal: Negative.  Negative for abdominal pain, blood in stool and melena.  Genitourinary: Negative.  Negative for dysuria and hematuria.  Musculoskeletal: Negative.  Negative for back pain.  Skin: Negative.  Negative for rash.  Neurological: Negative.  Negative for sensory change, focal weakness, weakness and headaches.  Psychiatric/Behavioral: Negative.  The patient is not  nervous/anxious.     As per HPI. Otherwise, a complete review of systems is negative.  PAST MEDICAL HISTORY: Past Medical History:  Diagnosis Date  . Arthritis   . Collagen vascular disease (HCC)    rheumatoid arthritis  . COPD (chronic obstructive pulmonary disease) ([place])   . GERD (gastroesophageal reflux disease)   . History of prostate cancer   . HLD (hyperlipidemia)   . HOH (hard of hearing)   . Insomnia   . Lung cancer ([place]) 2017   rad tx's.  . Oxygen deficit    2L HS AND PRN  . Prostate cancer ([place]) 2006   Rad seed tx's.  . Stroke ([place])    2000    PAST SURGICAL HISTORY: Past Surgical History:  Procedure Laterality Date  . APPENDECTOMY  1965  . CATARACT EXTRACTION W/PHACO Right 01/05/2016   Procedure: CATARACT EXTRACTION PHACO AND INTRAOCULAR LENS PLACEMENT (IOC);  Surgeon: [name], MD;  Location: [facility] ORS;  Service: Ophthalmology;  Laterality: Right;  [place] 1.14 AP% 18.1 CDE 13.44 FLUID PACK LOT # [id number] H  . CYSTOSCOPY W/ RETROGRADES Bilateral 11/01/2017   Procedure: CYSTOSCOPY WITH RETROGRADE PYELOGRAM;  Surgeon: Hollice Espy, MD;  Location: [facility] ORS;  Service: Urology;  Laterality: Bilateral;  . ENDOBRONCHIAL ULTRASOUND N/A 02/26/2015   Procedure: ENDOBRONCHIAL ULTRASOUND;  Surgeon: Flora Lipps, MD;  Location: [facility] ORS;  Service: Cardiopulmonary;  Laterality: N/A;  . [place]  . INSERTION PROSTATE RADIATION SEED  2002  . TRANSURETHRAL RESECTION OF PROSTATE N/A 11/01/2017   Procedure: TRANSURETHRAL RESECTION OF THE PROSTATE (TURP) (channel TURP);  Surgeon: Hollice Espy, MD;  Location: [facility] ORS;  Service: Urology;  Laterality: N/A;    FAMILY HISTORY: Family History  Problem Relation Age of Onset  . Cancer  Mother   . Cancer Father        Lung Cancer  . Cancer Sister        breast  . Cancer Brother        stomach    ADVANCED DIRECTIVES (Y/N):  N  HEALTH MAINTENANCE: Social History   Tobacco Use  . Smoking status: Former Smoker     Packs/day: 0.50    Years: 50.00    Pack years: 25.00    Types: Cigarettes    Last attempt to quit: 02/21/2013    Years since quitting: 4.7  . Smokeless tobacco: Never Used  . Tobacco comment: smoking cessation materials not required  Substance Use Topics  . Alcohol use: No    Alcohol/week: 0.0 standard drinks  . Drug use: No     Colonoscopy:  PAP:  Bone density:  Lipid panel:  Allergies  Allergen Reactions  . Plaquenil [Hydroxychloroquine]     Bad dreams  . Simvastatin Other (See Comments)    Bad dreams    Current Outpatient Medications  Medication Sig Dispense Refill  . folic acid (FOLVITE) 1 MG tablet Take 1 tablet (1 mg total) by mouth daily. 30 tablet 5  . methotrexate (RHEUMATREX) 2.5 MG tablet Take 5 tablets by mouth once a week.    Marland Kitchen omeprazole (PRILOSEC) 40 MG capsule Take 1 capsule (40 mg total) by mouth daily. 90 capsule 1  . OXYGEN Place 2 L into the nose daily as needed.     . tamsulosin (FLOMAX) 0.4 MG CAPS capsule Take 1 capsule (0.4 mg total) by mouth daily. (Patient taking differently: Take 0.8 mg by mouth daily. ) 30 capsule 11  . traZODone (DESYREL) 100 MG tablet TAKE 1 TABLET BY MOUTH EVERYDAY AT BEDTIME 90 tablet 1   No current facility-administered medications for this visit.     OBJECTIVE: Vitals:   11/16/17 0836  BP: 118/63  Pulse: 77  Resp: 18  Temp: (!) 95.6 F (35.3 C)     Body mass index is 17.67 kg/m.    ECOG FS:0 - Asymptomatic  General: Well-developed, well-nourished, no acute distress. Eyes: Pink conjunctiva, anicteric sclera. HEENT: Normocephalic, moist mucous membranes, clear oropharnyx. Lungs: Clear to auscultation bilaterally. Heart: Regular rate and rhythm. No rubs, murmurs, or gallops. Abdomen: Soft, nontender, nondistended. No organomegaly noted, normoactive bowel sounds. Musculoskeletal: No edema, cyanosis, or clubbing. Neuro: Alert, answering all questions appropriately. Cranial nerves grossly intact. Skin: No rashes or  petechiae noted. Psych: Normal affect. Lymphatics: No cervical, calvicular, axillary or inguinal LAD.  LAB RESULTS:  Lab Results  Component Value Date   NA 138 11/02/2017   K 3.6 11/02/2017   CL 109 11/02/2017   CO2 26 11/02/2017   GLUCOSE 92 11/02/2017   BUN 19 11/02/2017   CREATININE 1.04 11/02/2017   CALCIUM 7.8 (L) 11/02/2017   PROT 8.3 (H) 10/11/2017   ALBUMIN 2.9 (L) 03/01/2017   AST 14 10/11/2017   ALT 5 (L) 10/11/2017   ALKPHOS 83 03/01/2017   BILITOT 0.3 10/11/2017   GFRNONAA >60 11/02/2017   GFRAA >60 11/02/2017    Lab Results  Component Value Date   WBC 3.8 11/02/2017   NEUTROABS 3,604 10/11/2017   HGB 9.1 (L) 11/02/2017   HCT 26.7 (L) 11/02/2017   MCV 96.4 11/02/2017   PLT 125 (L) 11/02/2017     STUDIES: Ct Abdomen Pelvis W Contrast  Result Date: 11/14/2017 CLINICAL DATA:  Bladder cancer EXAM: CT ABDOMEN AND PELVIS WITH CONTRAST TECHNIQUE: Multidetector CT imaging  of the abdomen and pelvis was performed using the standard protocol following bolus administration of intravenous contrast. CONTRAST:  11mL OMNIPAQUE IOHEXOL 300 MG/ML  SOL[facility] COMPARISON:  08/18/2015 FINDINGS: Lower chest: Lung bases are essentially clear. Hepatobiliary: Liver is notable for heterogeneous perfusion, favoring regional steatosis in the central right liver (series 2/image 18), without focal lesion. Gallbladder is unremarkable. No intrahepatic or extrahepatic ductal dilatation. Pancreas: Within normal limits. Spleen: Within normal limits. Adrenals/Urinary Tract: Adrenal glands within normal limits. 4 mm right lower pole renal cyst (series 2/image 28). Kidneys are without is within normal limits. No hydronephrosis. Bladder is thick-walled although underdistended with indwelling Foley catheter. Stomach/Bowel: Stomach is within normal limits. No evidence of bowel obstruction. Appendix is not discretely visualized. Sigmoid diverticulosis with mild chronic mucosal wall thickening.  Vascular/Lymphatic: No evidence of abdominal aortic aneurysm. Atherosclerotic calcifications of the abdominal aorta and branch vessels. No suspicious abdominopelvic lymphadenopathy. Reproductive: Brachytherapy seeds in the prostate. Other: Trace pelvic ascites. Musculoskeletal: Degenerative changes of the lower lumbar spine. IMPRESSION: Bladder is thick-walled although underdistended/decompressed by indwelling Foley catheter in this patient with known bladder cancer. Brachytherapy seeds in the prostate. No findings specific for metastatic disease. Electronically Signed   By: Julian Hy M.D.   On: 11/14/2017 18:18    ASSESSMENT: Locally advanced high-grade urothelial carcinoma.  PLAN:    1. Locally advanced high-grade urothelial carcinoma: Case discussed with urology, radiation oncology, as well as multidisciplinary tumor board.  Is determined the patient is not a surgical candidate nor can he received additional XRT given his history of prostate cancer and brachii therapy seed placement.  Though he has a decent performance status, patient is frail and likely would not tolerate his platinum based therapy.  Plans to get a PET scan to complete the staging work-up and then return to clinic to initiate immunotherapy with Tecentriq every 3 weeks.  Patient will likely require periodic cystoscopies to assess for therapy response.  Return to clinic in approximately 1 week to initiate treatment. 2.  Right upper lobe lung mass: Although biopsy was negative, patient had a positive PET scan that was highly suspicious for underlying malignancy. He underwent SBRT in approximately May 2017.  Repeat PET scan as above.   3. Shortness of breath: Chronic and unchanged. 4.  Anemia: Patient's hemoglobin remains decreased at 9.1.  Which is approximately his baseline. 5.  Thrombocytopenia: Chronic and unchanged.   Patient expressed understanding and was in agreement with this plan. He also understands that He can call  clinic at any time with any questions, concerns, or complaints.    Lloyd Huger, MD   11/17/2017 2:35 PM

## 2017-11-14 ENCOUNTER — Ambulatory Visit
Admission: RE | Admit: 2017-11-14 | Discharge: 2017-11-14 | Disposition: A | Discharge status: Home / Self Care | Payer: Medicare HMO | Source: Ambulatory Visit | Attending: Urology | Admitting: Urology

## 2017-11-14 DIAGNOSIS — C675 Malignant neoplasm of bladder neck: Secondary | ICD-10-CM | POA: Insufficient documentation

## 2017-11-14 DIAGNOSIS — Z978 Presence of other specified devices: Secondary | ICD-10-CM | POA: Diagnosis not present

## 2017-11-14 MED ORDER — IOHEXOL 300 MG/ML  SOLN
100.0000 mL | Freq: Once | INTRAMUSCULAR | Status: AC | PRN
  Administered 2017-11-14: 100 mL via INTRAVENOUS

## 2017-11-15 IMAGING — CT CT CHEST W/ CM
2 of 3 series · 15 of 36 positions shown, 18 images · IV contrast (iopamidol)
Comparison: 06/05/2015.

CLINICAL DATA: Right upper lobe lung cancer, prostate cancer,
wheezing.

EXAM:
CT CHEST WITH CONTRAST
TECHNIQUE: Multidetector CT imaging of the chest was performed during
intravenous contrast administration.
CONTRAST:  75mL 69YB0V-[id number] IOPAMIDOL (69YB0V-EUU) INJECTION 61%

[Series 3: axial st · axial · 0.68mm/px · z∈[-599,-293]mm · 12 of 181 slices shown, 15 images]
[im 14/181  mediastinal]
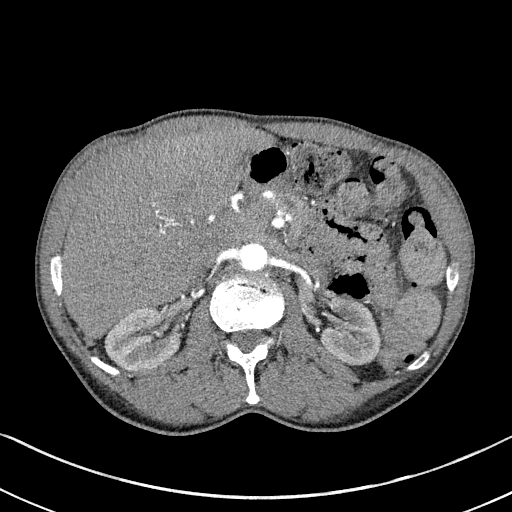
[im 14/181  lung]
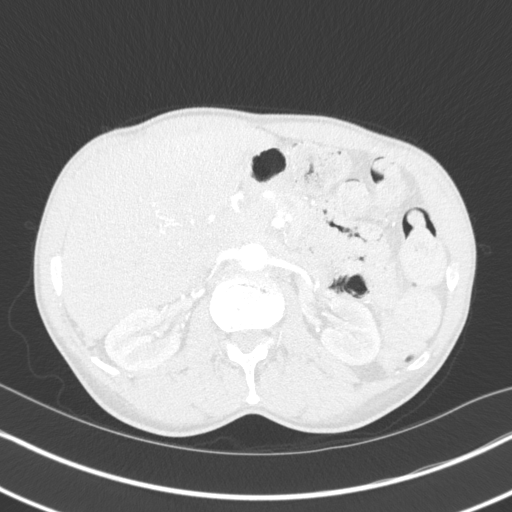
[im 27/181  lung]
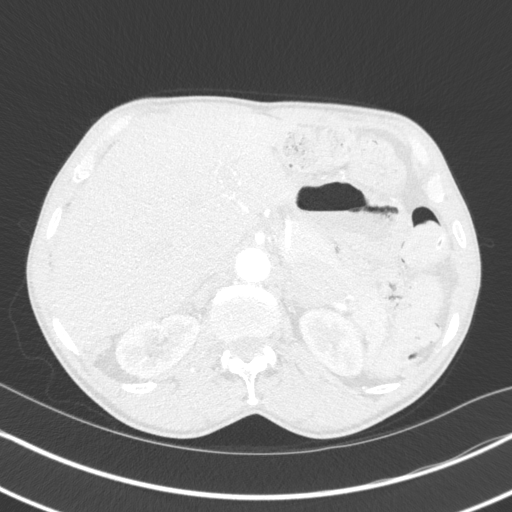
[im 41/181  lung]
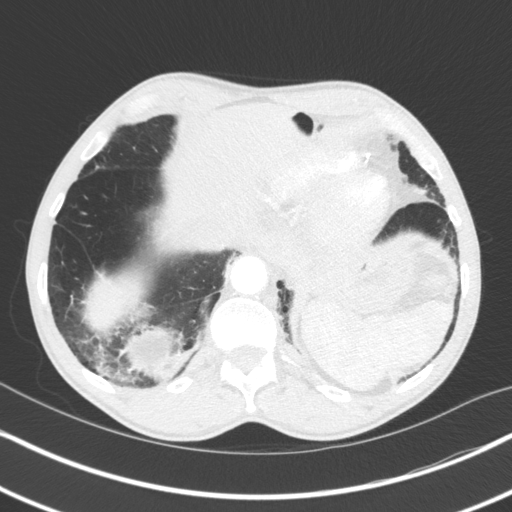
[im 54/181  lung]
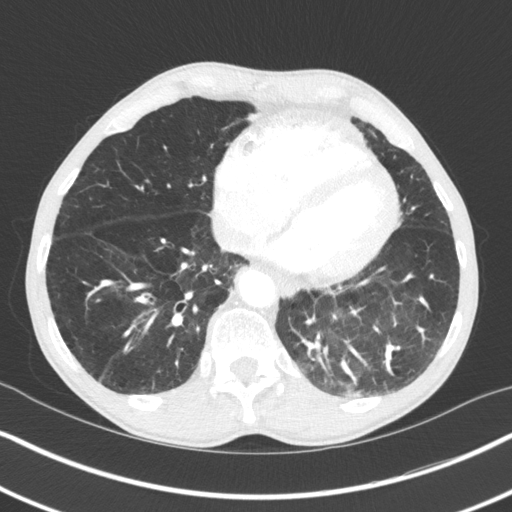
[im 67/181  mediastinal]
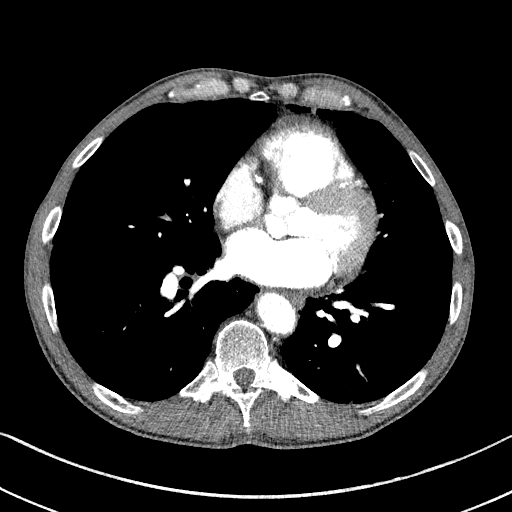
[im 67/181  lung]
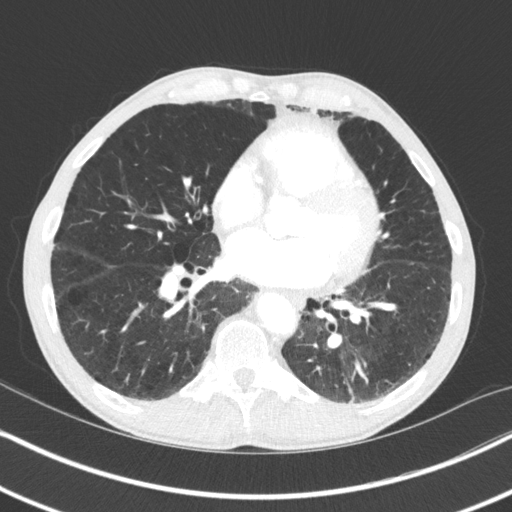
[im 81/181  lung]
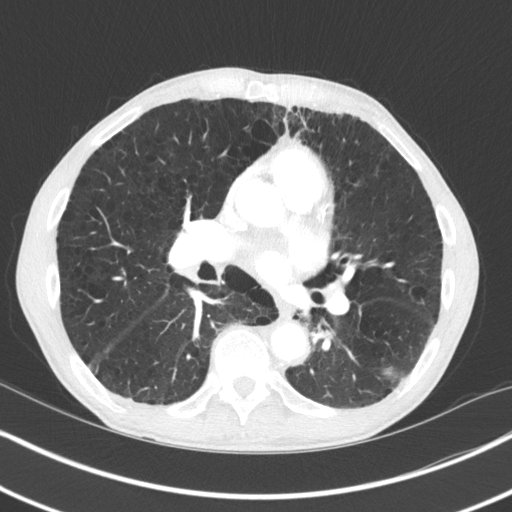
[im 101/181  lung]
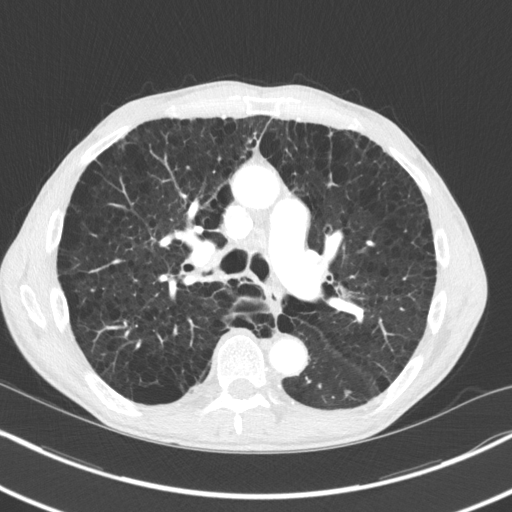
[im 114/181  lung]
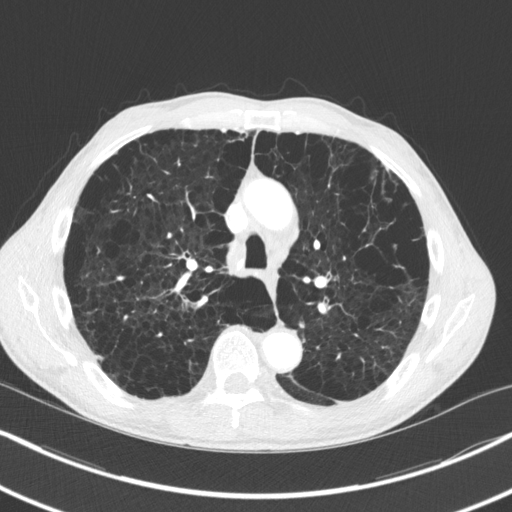
[im 127/181  mediastinal]
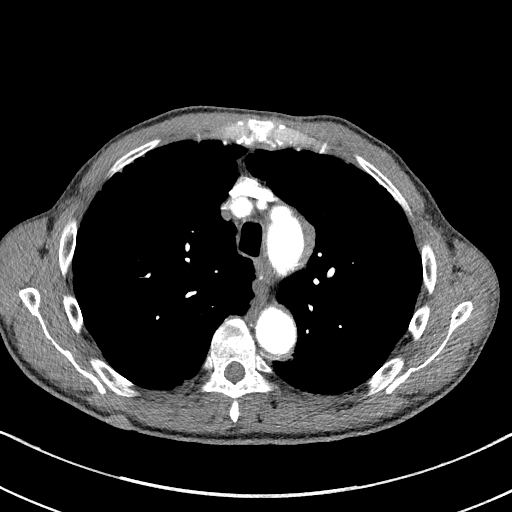
[im 127/181  lung]
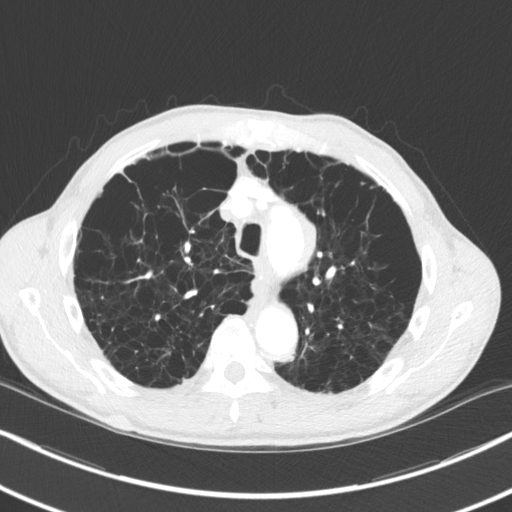
[im 141/181  lung]
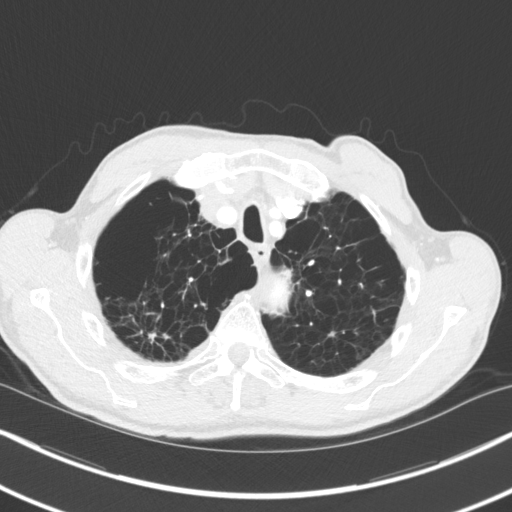
[im 154/181  lung]
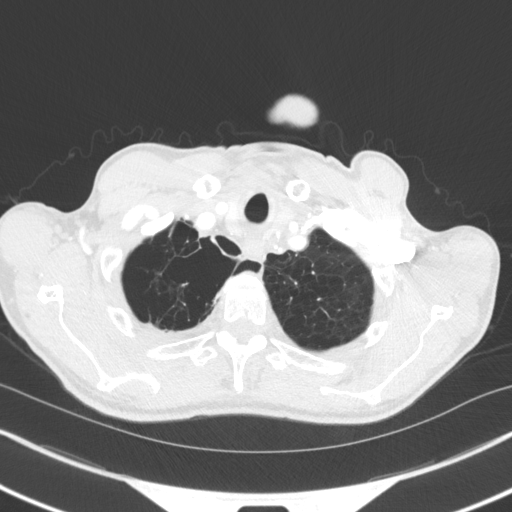
[im 167/181  lung]
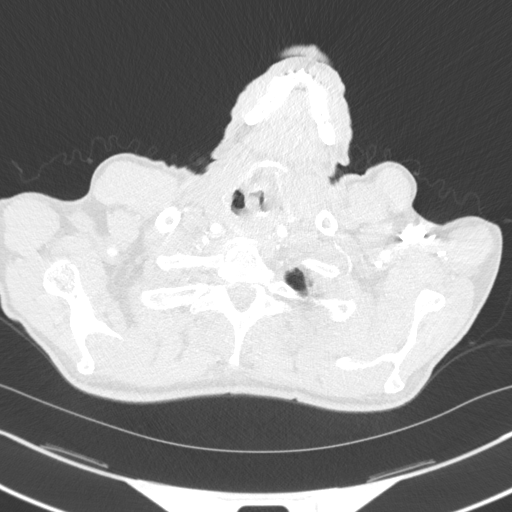

[Series 6: coronal · coronal · 0.67mm/px · 3 of 128 slices shown]
[im 26/128  lung]
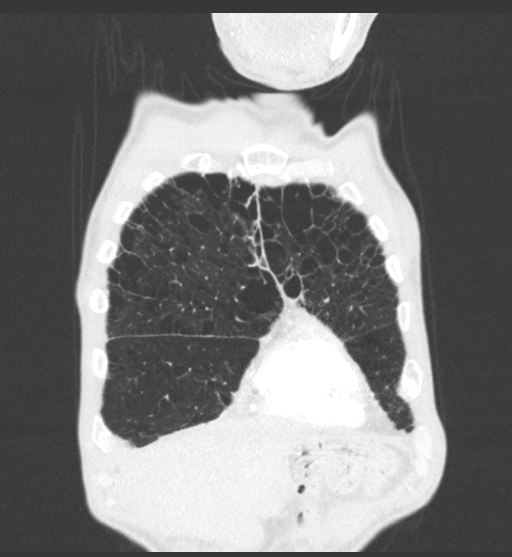
[im 51/128  lung]
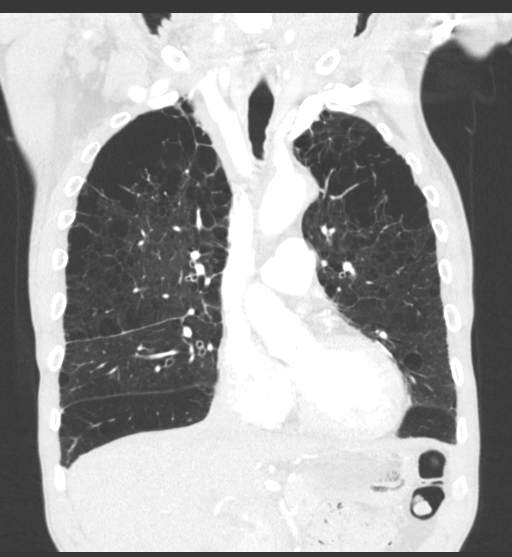
[im 77/128  lung]
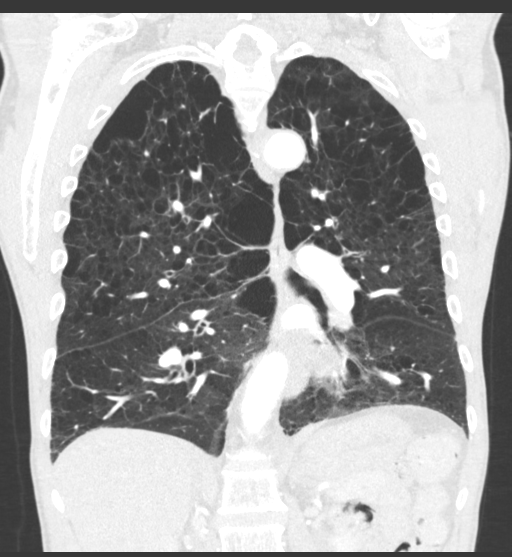

[15 of 36 positions shown; findings below may reference images not displayed]

FINDINGS: Cardiovascular: Atherosclerotic calcification of the arterial
vasculature, including coronary arteries. Heart is at the upper
limits of normal in size. No pericardial effusion.

Mediastinum/Nodes: Mediastinal lymph nodes are not enlarged by CT
size criteria. Bi hilar lymphoid tissue. No axillary adenopathy.
Esophagus is grossly unremarkable.

Lungs/Pleura: Irregular nodule in the posterior aspect of the apical
segment right upper lobe measures 6 x 14 mm (image 38), previously 8
x 16 mm. Linear scarring in the anterior segment right upper lobe.
Tubular or ovoid nodular lesion in the right middle lobe measures 9
mm (image 120), stable. Mild scattered pulmonary parenchymal
scarring. Posterior left upper lobe nodule measures 5 mm, stable.
Bullous emphysema. No pleural fluid. Airway is unremarkable.

Upper Abdomen: Visualized portions of the liver, adrenal glands,
kidneys, spleen, pancreas, stomach and bowel are grossly
unremarkable.

Musculoskeletal: No worrisome lytic or sclerotic lesions.
Degenerative changes are seen in the spine.
IMPRESSION: 1. Slight decrease in size of a spiculated nodule in the apical
segment right upper lobe.
2. Additional scattered pulmonary nodules are unchanged.
3.  Aortic atherosclerosis (XY2C3-170.0).
4.  Emphysema (XY2C3-ITT.H).  Coronary artery calcification.

## 2017-11-16 ENCOUNTER — Inpatient Hospital Stay: Payer: Medicare HMO | Attending: Oncology | Admitting: Oncology

## 2017-11-16 ENCOUNTER — Other Ambulatory Visit: Payer: Self-pay

## 2017-11-16 VITALS — BP 118/63 | HR 77 | Temp 95.6°F | Resp 18 | Wt 116.2 lb

## 2017-11-16 DIAGNOSIS — C679 Malignant neoplasm of bladder, unspecified: Secondary | ICD-10-CM | POA: Insufficient documentation

## 2017-11-16 DIAGNOSIS — R918 Other nonspecific abnormal finding of lung field: Secondary | ICD-10-CM | POA: Diagnosis not present

## 2017-11-16 DIAGNOSIS — D696 Thrombocytopenia, unspecified: Secondary | ICD-10-CM | POA: Diagnosis not present

## 2017-11-16 DIAGNOSIS — Z8546 Personal history of malignant neoplasm of prostate: Secondary | ICD-10-CM | POA: Diagnosis not present

## 2017-11-16 DIAGNOSIS — R0602 Shortness of breath: Secondary | ICD-10-CM | POA: Diagnosis not present

## 2017-11-16 DIAGNOSIS — D649 Anemia, unspecified: Secondary | ICD-10-CM | POA: Diagnosis not present

## 2017-11-16 DIAGNOSIS — Z923 Personal history of irradiation: Secondary | ICD-10-CM | POA: Insufficient documentation

## 2017-11-16 NOTE — Progress Notes (Signed)
Here for better " on a scale of 1-10 I feel like above 7 " here w daughter and g daughter.

## 2017-11-17 ENCOUNTER — Telehealth: Payer: Self-pay | Admitting: *Deleted

## 2017-11-17 MED ORDER — PROCHLORPERAZINE MALEATE 10 MG PO TABS: 10.0000 mg | ORAL_TABLET | Freq: Four times a day (QID) | ORAL | 2 refills | Status: DC | PRN

## 2017-11-17 NOTE — Telephone Encounter (Signed)
I just put in a schedule message about 5 minutes ago.  PET and chemo class next week.  See me on Thursday with tecentriq.

## 2017-11-17 NOTE — Telephone Encounter (Signed)
Patient called to find out what the plan is for his cancer treatment. He said Dr. [name] was supposed to call him and he hasn't heard anything. Please advise.   dhs

## 2017-11-17 NOTE — Progress Notes (Signed)
START OFF PATHWAY REGIMEN - Bladder   [id number]:Atezolizumab 1,200 mg q21 Days:   A cycle is every 21 days:     Atezolizumab   **Always confirm dose/schedule in your pharmacy ordering system**  Patient Characteristics: Pre Cystectomy, Clinical T2-T4a, N0-1, M0, Cystectomy Ineligible/Patient Refuses Cystectomy AJCC M Category: M0 AJCC N Category: NX AJCC T Category: TX Current evidence of distant metastases<= No AJCC 8 Stage Grouping: Unknown Intent of Therapy: Curative Intent, Discussed with Patient

## 2017-11-20 ENCOUNTER — Inpatient Hospital Stay: Payer: Medicare HMO

## 2017-11-20 NOTE — Patient Instructions (Signed)
Atezolizumab injection What is this medicine? ATEZOLIZUMAB (a te zoe LIZ ue mab) is a monoclonal antibody. It is used to treat bladder cancer (urothelial cancer) and non-small cell lung cancer. This medicine may be used for other purposes; ask your health care provider or pharmacist if you have questions. COMMON BRAND NAME(S): Tecentriq What should I tell my health care provider before I take this medicine? They need to know if you have any of these conditions: -diabetes -immune system problems -infection -inflammatory bowel disease -liver disease -lung or breathing disease -lupus -nervous system problems like myasthenia gravis or Guillain-Barre syndrome -organ transplant -an unusual or allergic reaction to atezolizumab, other medicines, foods, dyes, or preservatives -pregnant or trying to get pregnant -breast-feeding How should I use this medicine? This medicine is for infusion into a vein. It is given by a health care professional in a hospital or clinic setting. A special MedGuide will be given to you before each treatment. Be sure to read this information carefully each time. Talk to your pediatrician regarding the use of this medicine in children. Special care may be needed. Overdosage: If you think you have taken too much of this medicine contact a poison control center or emergency room at once. NOTE: This medicine is only for you. Do not share this medicine with others. What if I miss a dose? It is important not to miss your dose. Call your doctor or health care professional if you are unable to keep an appointment. What may interact with this medicine? Interactions have not been studied. This list may not describe all possible interactions. Give your health care provider a list of all the medicines, herbs, non-prescription drugs, or dietary supplements you use. Also tell them if you smoke, drink alcohol, or use illegal drugs. Some items may interact with your medicine. What  should I watch for while using this medicine? Your condition will be monitored carefully while you are receiving this medicine. You may need blood work done while you are taking this medicine. Do not become pregnant while taking this medicine or for at least 5 months after stopping it. Women should inform their doctor if they wish to become pregnant or think they might be pregnant. There is a potential for serious side effects to an unborn child. Talk to your health care professional or pharmacist for more information. Do not breast-feed an infant while taking this medicine or for at least 5 months after the last dose. What side effects may I notice from receiving this medicine? Side effects that you should report to your doctor or health care professional as soon as possible: -allergic reactions like skin rash, itching or hives, swelling of the face, lips, or tongue -black, tarry stools -bloody or watery diarrhea -breathing problems -changes in vision -chest pain or chest tightness -chills -facial flushing -fever -headache -signs and symptoms of high blood sugar such as dizziness; dry mouth; dry skin; fruity breath; nausea; stomach pain; increased hunger or thirst; increased urination -signs and symptoms of liver injury like dark yellow or brown urine; general ill feeling or flu-like symptoms; light-colored stools; loss of appetite; nausea; right upper belly pain; unusually weak or tired; yellowing of the eyes or skin -stomach pain -trouble passing urine or change in the amount of urine Side effects that usually do not require medical attention (report to your doctor or health care professional if they continue or are bothersome): -cough -diarrhea -joint pain -muscle pain -muscle weakness -tiredness -weight loss This list may not describe all   possible side effects. Call your doctor for medical advice about side effects. You may report side effects to FDA at 1-800-FDA-1088. Where should  I keep my medicine? This drug is given in a hospital or clinic and will not be stored at home. NOTE: This sheet is a summary. It may not cover all possible information. If you have questions about this medicine, talk to your doctor, pharmacist, or health care provider.  2018 Elsevier/Gold Standard (2015-03-11 17:54:14)  

## 2017-11-20 NOTE — Progress Notes (Signed)
[facility]  [phone] [phone]  ID: Collin Gonzales OB: May 12, 1935  MR#: [id number]  HBZ#:[id number]  Patient Care Team: [name], MD as PCP - General (Family Medicine) [name], MD as Referring Physician (Ophthalmology) [name], MD as Consulting Physician (Rheumatology) [name], MD as Referring Physician (Specialist) [name], MD as Consulting Physician (Urology) [name], MD as Referring Physician (Radiation Oncology)  CHIEF COMPLAINT: Locally advanced high-grade urothelial carcinoma.    INTERVAL HISTORY: Patient returns to clinic today for further evaluation, discussion of his imaging results, and initiation of Tecentriq.  He currently feels well and is asymptomatic. He has no neurologic complaints.  He denies any recent fevers or illnesses.  He has chronic shortness of breath, but denies any chest pain, hemoptysis, or cough.  He has no nausea, vomiting, constipation, or diarrhea.  He has no urinary complaints today.  Patient feels at his baseline offers no specific complaints today.  REVIEW OF SYSTEMS:   Review of Systems  Constitutional: Negative.  Negative for fever, malaise/fatigue and weight loss.  Respiratory: Negative.  Negative for cough, hemoptysis and shortness of breath.   Cardiovascular: Negative.  Negative for chest pain and leg swelling.  Gastrointestinal: Negative.  Negative for abdominal pain, blood in stool and melena.  Genitourinary: Negative.  Negative for dysuria and hematuria.  Musculoskeletal: Negative.  Negative for back pain.  Skin: Negative.  Negative for rash.  Neurological: Negative.  Negative for sensory change, focal weakness, weakness and headaches.  Psychiatric/Behavioral: Negative.  The patient is not nervous/anxious.     As per HPI. Otherwise, a complete review of systems is negative.  PAST MEDICAL HISTORY: Past Medical History:  Diagnosis  Date  . Arthritis   . Collagen vascular disease (HCC)    rheumatoid arthritis  . COPD (chronic obstructive pulmonary disease) ([place])   . GERD (gastroesophageal reflux disease)   . History of prostate cancer   . HLD (hyperlipidemia)   . HOH (hard of hearing)   . Insomnia   . Lung cancer ([place]) 2017   rad tx's.  . Oxygen deficit    2L HS AND PRN  . Prostate cancer ([place]) 2006   Rad seed tx's.  . Stroke ([place])    2000    PAST SURGICAL HISTORY: Past Surgical History:  Procedure Laterality Date  . APPENDECTOMY  1965  . CATARACT EXTRACTION W/PHACO Right 01/05/2016   Procedure: CATARACT EXTRACTION PHACO AND INTRAOCULAR LENS PLACEMENT (IOC);  Surgeon: [name], MD;  Location: [facility] ORS;  Service: Ophthalmology;  Laterality: Right;  [place] 1.14 AP% 18.1 CDE 13.44 FLUID PACK LOT # [id number]  . CYSTOSCOPY W/ RETROGRADES Bilateral 11/01/2017   Procedure: CYSTOSCOPY WITH RETROGRADE PYELOGRAM;  Surgeon: Hollice Espy, MD;  Location: [facility] ORS;  Service: Urology;  Laterality: Bilateral;  . ENDOBRONCHIAL ULTRASOUND N/A 02/26/2015   Procedure: ENDOBRONCHIAL ULTRASOUND;  Surgeon: Flora Lipps, MD;  Location: [facility] ORS;  Service: Cardiopulmonary;  Laterality: N/A;  . [place]  . INSERTION PROSTATE RADIATION SEED  2002  . TRANSURETHRAL RESECTION OF PROSTATE N/A 11/01/2017   Procedure: TRANSURETHRAL RESECTION OF THE PROSTATE (TURP) (channel TURP);  Surgeon: Hollice Espy, MD;  Location: [facility] ORS;  Service: Urology;  Laterality: N/A;    FAMILY HISTORY: Family History  Problem Relation Age of Onset  . Cancer Mother   . Cancer Father        Lung Cancer  . Cancer Sister        breast  .  Cancer Brother        stomach    ADVANCED DIRECTIVES (Y/N):  N  HEALTH MAINTENANCE: Social History   Tobacco Use  . Smoking status: Former Smoker    Packs/day: 0.50    Years: 50.00    Pack years: 25.00    Types: Cigarettes    Last attempt to quit: 02/21/2013    Years since quitting: 4.7  .  Smokeless tobacco: Never Used  . Tobacco comment: smoking cessation materials not required  Substance Use Topics  . Alcohol use: No    Alcohol/week: 0.0 standard drinks  . Drug use: No     Colonoscopy:  PAP:  Bone density:  Lipid panel:  Allergies  Allergen Reactions  . Plaquenil [Hydroxychloroquine]     Bad dreams  . Simvastatin Other (See Comments)    Bad dreams    Current Outpatient Medications  Medication Sig Dispense Refill  . folic acid (FOLVITE) 1 MG tablet Take 1 tablet (1 mg total) by mouth daily. 30 tablet 5  . methotrexate (RHEUMATREX) 2.5 MG tablet Take 5 tablets by mouth once a week.    . OXYGEN Place 2 L into the nose daily as needed.     . tamsulosin (FLOMAX) 0.4 MG CAPS capsule Take 1 capsule (0.4 mg total) by mouth daily. (Patient taking differently: Take 0.8 mg by mouth daily. ) 30 capsule 11  . traZODone (DESYREL) 100 MG tablet TAKE 1 TABLET BY MOUTH EVERYDAY AT BEDTIME 90 tablet 1  . Fluticasone-Umeclidin-Vilant (TRELEGY ELLIPTA) 100-62.5-25 MCG/INH AEPB Inhale 1 puff into the lungs daily. 60 each 5  . prochlorperazine (COMPAZINE) 10 MG tablet Take 1 tablet (10 mg total) by mouth every 6 (six) hours as needed (Nausea or vomiting). (Patient not taking: Reported on 11/24/2017) 60 tablet 2   No current facility-administered medications for this visit.     OBJECTIVE: Vitals:   11/23/17 0839  BP: (!) 109/58  Pulse: 72  Resp: 18  Temp: (!) 94.2 F (34.6 C)     Body mass index is 17.99 kg/m.    ECOG FS:0 - Asymptomatic  General: Well-developed, well-nourished, no acute distress. Eyes: Pink conjunctiva, anicteric sclera. HEENT: Normocephalic, moist mucous membranes. Lungs: Clear to auscultation bilaterally. Heart: Regular rate and rhythm. No rubs, murmurs, or gallops. Abdomen: Soft, nontender, nondistended. No organomegaly noted, normoactive bowel sounds. Musculoskeletal: No edema, cyanosis, or clubbing. Neuro: Alert, answering all questions  appropriately. Cranial nerves grossly intact. Skin: No rashes or petechiae noted. Psych: Normal affect.  LAB RESULTS:  Lab Results  Component Value Date   NA 142 11/23/2017   K 3.7 11/23/2017   CL 104 11/23/2017   CO2 29 11/23/2017   GLUCOSE 95 11/23/2017   BUN 16 11/23/2017   CREATININE 1.20 11/23/2017   CALCIUM 8.9 11/23/2017   PROT 8.1 11/23/2017   ALBUMIN 2.8 (L) 11/23/2017   AST 18 11/23/2017   ALT 8 11/23/2017   ALKPHOS 73 11/23/2017   BILITOT 0.3 11/23/2017   GFRNONAA 56 (L) 11/23/2017   GFRAA >60 11/23/2017    Lab Results  Component Value Date   WBC 8.0 11/23/2017   NEUTROABS 5.4 11/23/2017   HGB 10.3 (L) 11/23/2017   HCT 31.3 (L) 11/23/2017   MCV 95.5 11/23/2017   PLT 427 11/23/2017     STUDIES: Ct Abdomen Pelvis W Contrast  Result Date: 11/14/2017 CLINICAL DATA:  Bladder cancer EXAM: CT ABDOMEN AND PELVIS WITH CONTRAST TECHNIQUE: Multidetector CT imaging of the abdomen and pelvis was performed using the  standard protocol following bolus administration of intravenous contrast. CONTRAST:  181mL OMNIPAQUE IOHEXOL 300 MG/ML  SOLN COMPARISON:  08/18/2015 FINDINGS: Lower chest: Lung bases are essentially clear. Hepatobiliary: Liver is notable for heterogeneous perfusion, favoring regional steatosis in the central right liver (series 2/image 18), without focal lesion. Gallbladder is unremarkable. No intrahepatic or extrahepatic ductal dilatation. Pancreas: Within normal limits. Spleen: Within normal limits. Adrenals/Urinary Tract: Adrenal glands within normal limits. 4 mm right lower pole renal cyst (series 2/image 28). Kidneys are without is within normal limits. No hydronephrosis. Bladder is thick-walled although underdistended with indwelling Foley catheter. Stomach/Bowel: Stomach is within normal limits. No evidence of bowel obstruction. Appendix is not discretely visualized. Sigmoid diverticulosis with mild chronic mucosal wall thickening. Vascular/Lymphatic: No  evidence of abdominal aortic aneurysm. Atherosclerotic calcifications of the abdominal aorta and branch vessels. No suspicious abdominopelvic lymphadenopathy. Reproductive: Brachytherapy seeds in the prostate. Other: Trace pelvic ascites. Musculoskeletal: Degenerative changes of the lower lumbar spine. IMPRESSION: Bladder is thick-walled although underdistended/decompressed by indwelling Foley catheter in this patient with known bladder cancer. Brachytherapy seeds in the prostate. No findings specific for metastatic disease. Electronically Signed   By: Julian Hy M.D.   On: 11/14/2017 18:18   Nm Pet Image Initial (pi) Skull Base To Thigh  Result Date: 11/22/2017 CLINICAL DATA:  Subsequent treatment strategy for urothelial carcinoma of the bladder. Lung cancer diagnosed 2017. RIGHT upper lobe SP RT. EXAM: NUCLEAR MEDICINE PET SKULL BASE TO THIGH TECHNIQUE: 6.5 mCi F-18 FDG was injected intravenously. Full-ring PET imaging was performed from the skull base to thigh after the radiotracer. CT data was obtained and used for attenuation correction and anatomic localization. Fasting blood glucose: 80 mg/dl COMPARISON:  PET-CT 06/23/2015 FINDINGS: Mediastinal blood pool activity: SUV max 1.7 NECK: No hypermetabolic lymph nodes in the neck. Incidental CT findings: none CHEST: There is reticular scarring thickening in the RIGHT upper lobe at the previous focal hypermetabolic nodule. There is mild metabolic activity with SUV max equal 2.3 decreased from 9.3 following therapy. No discrete nodularity. Phase favored post radiation change. There is severe centrilobular emphysema the upper lobes. There is bullous change in the RIGHT upper lobe. No new pulmonary nodules. Incidental CT findings: None ABDOMEN/PELVIS: No abnormal metabolic activity liver. No hypermetabolic abdominopelvic lymph nodes. There is no evidence of local bladder cancer recurrence. Intense radiotracer activity within the bladder does limit evaluation  of the bladder near bladder tissues. Foley catheter in place. No abnormal metabolic activity the prostate bed. No hypermetabolic pelvic lymph nodes. Incidental CT findings: none SKELETON: No focal hypermetabolic activity to suggest skeletal metastasis. Incidental CT findings: none IMPRESSION: 1. No evidence of local bladder recurrence or metastasis in the pelvis. 2. No evidence distant metastatic disease. 3. The bladder itself is not well evaluated by FDG PET imaging. 4. Reticular scarring in the RIGHT upper lobe at SB RT site with mild metabolic activity is favored benign post radiation change. 5. Aortic Atherosclerosis (ICD10-I70.0) and Emphysema (ICD10-J43.9). Electronically Signed   By: Suzy Bouchard M.D.   On: 11/22/2017 15:37    ASSESSMENT: Locally advanced high-grade urothelial carcinoma.  PLAN:    1. Locally advanced high-grade urothelial carcinoma: Case discussed with urology, radiation oncology, as well as multidisciplinary tumor board.  Patient is not a surgical candidate nor can he received additional XRT given his history of prostate cancer and brachii therapy seed placement.  Though he has a decent performance status, patient is frail and likely would not tolerate cisplatin based therapy.  PET scan results  from November 22, 2017 reviewed independently and report as above with no obvious metastatic disease.  Proceed with cycle 1 Tecentriq today. Patient will likely require periodic cystoscopies to assess for therapy response.  Return to clinic in 3 weeks for further evaluation and consideration of cycle 2.   2.  Right upper lobe lung mass: PET scan results as above.  Previously although biopsy was negative, patient had a positive PET scan that was highly suspicious for underlying malignancy. He underwent SBRT in approximately May 2017.   3. Shortness of breath: Patient does not complain of this today.  Chronic and unchanged. 4.  Anemia: Decreased, but chronic and unchanged.  Monitor.   5.   Thrombocytopenia: Patient's platelet count is within normal limits today.   Patient expressed understanding and was in agreement with this plan. He also understands that He can call clinic at any time with any questions, concerns, or complaints.    Lloyd Huger, MD   11/25/2017 8:17 AM

## 2017-11-22 ENCOUNTER — Ambulatory Visit
Admission: RE | Admit: 2017-11-22 | Discharge: 2017-11-22 | Disposition: A | Discharge status: Home / Self Care | Payer: Medicare HMO | Source: Ambulatory Visit | Attending: Oncology | Admitting: Oncology

## 2017-11-22 DIAGNOSIS — C679 Malignant neoplasm of bladder, unspecified: Secondary | ICD-10-CM | POA: Diagnosis present

## 2017-11-22 DIAGNOSIS — J439 Emphysema, unspecified: Secondary | ICD-10-CM | POA: Diagnosis not present

## 2017-11-22 DIAGNOSIS — I700 Atherosclerosis of aorta: Secondary | ICD-10-CM | POA: Insufficient documentation

## 2017-11-22 LAB — GLUCOSE, CAPILLARY: Glucose-Capillary: 80 mg/dL (ref 70–99)

## 2017-11-22 MED ORDER — FLUDEOXYGLUCOSE F - 18 (FDG) INJECTION
6.0000 | Freq: Once | INTRAVENOUS | Status: AC | PRN
  Administered 2017-11-22: 6.517 via INTRAVENOUS

## 2017-11-23 ENCOUNTER — Inpatient Hospital Stay (HOSPITAL_BASED_OUTPATIENT_CLINIC_OR_DEPARTMENT_OTHER): Payer: Medicare HMO | Admitting: Oncology

## 2017-11-23 ENCOUNTER — Inpatient Hospital Stay: Payer: Medicare HMO

## 2017-11-23 ENCOUNTER — Other Ambulatory Visit: Payer: Self-pay

## 2017-11-23 ENCOUNTER — Inpatient Hospital Stay: Payer: Medicare HMO | Attending: Oncology

## 2017-11-23 VITALS — BP 109/58 | HR 72 | Temp 94.2°F | Resp 18 | Wt 118.3 lb

## 2017-11-23 DIAGNOSIS — C679 Malignant neoplasm of bladder, unspecified: Secondary | ICD-10-CM

## 2017-11-23 DIAGNOSIS — Z803 Family history of malignant neoplasm of breast: Secondary | ICD-10-CM | POA: Diagnosis not present

## 2017-11-23 DIAGNOSIS — E785 Hyperlipidemia, unspecified: Secondary | ICD-10-CM | POA: Insufficient documentation

## 2017-11-23 DIAGNOSIS — E1122 Type 2 diabetes mellitus with diabetic chronic kidney disease: Secondary | ICD-10-CM | POA: Insufficient documentation

## 2017-11-23 DIAGNOSIS — Z8673 Personal history of transient ischemic attack (TIA), and cerebral infarction without residual deficits: Secondary | ICD-10-CM | POA: Insufficient documentation

## 2017-11-23 DIAGNOSIS — D649 Anemia, unspecified: Secondary | ICD-10-CM | POA: Insufficient documentation

## 2017-11-23 DIAGNOSIS — I129 Hypertensive chronic kidney disease with stage 1 through stage 4 chronic kidney disease, or unspecified chronic kidney disease: Secondary | ICD-10-CM

## 2017-11-23 DIAGNOSIS — R0602 Shortness of breath: Secondary | ICD-10-CM

## 2017-11-23 DIAGNOSIS — Z8546 Personal history of malignant neoplasm of prostate: Secondary | ICD-10-CM

## 2017-11-23 DIAGNOSIS — R918 Other nonspecific abnormal finding of lung field: Secondary | ICD-10-CM | POA: Insufficient documentation

## 2017-11-23 DIAGNOSIS — M069 Rheumatoid arthritis, unspecified: Secondary | ICD-10-CM

## 2017-11-23 DIAGNOSIS — K219 Gastro-esophageal reflux disease without esophagitis: Secondary | ICD-10-CM | POA: Insufficient documentation

## 2017-11-23 DIAGNOSIS — N189 Chronic kidney disease, unspecified: Secondary | ICD-10-CM | POA: Diagnosis not present

## 2017-11-23 DIAGNOSIS — J449 Chronic obstructive pulmonary disease, unspecified: Secondary | ICD-10-CM | POA: Diagnosis not present

## 2017-11-23 DIAGNOSIS — Z5112 Encounter for antineoplastic immunotherapy: Secondary | ICD-10-CM | POA: Diagnosis not present

## 2017-11-23 DIAGNOSIS — Z87891 Personal history of nicotine dependence: Secondary | ICD-10-CM

## 2017-11-23 DIAGNOSIS — Z800 Family history of malignant neoplasm of digestive organs: Secondary | ICD-10-CM | POA: Diagnosis not present

## 2017-11-23 DIAGNOSIS — Z79899 Other long term (current) drug therapy: Secondary | ICD-10-CM

## 2017-11-23 DIAGNOSIS — Z801 Family history of malignant neoplasm of trachea, bronchus and lung: Secondary | ICD-10-CM | POA: Diagnosis not present

## 2017-11-23 DIAGNOSIS — Z923 Personal history of irradiation: Secondary | ICD-10-CM | POA: Diagnosis not present

## 2017-11-23 DIAGNOSIS — D696 Thrombocytopenia, unspecified: Secondary | ICD-10-CM

## 2017-11-23 LAB — CBC WITH DIFFERENTIAL/PLATELET
BASOS PCT: 1 %
Basophils Absolute: 0.1 10*3/uL (ref 0–0.1)
EOS ABS: 0.1 10*3/uL (ref 0–0.7)
EOS PCT: 2 %
HCT: 31.3 % — ABNORMAL LOW (ref 40.0–52.0)
HEMOGLOBIN: 10.3 g/dL — AB (ref 13.0–18.0)
Lymphocytes Relative: 19 %
Lymphs Abs: 1.5 10*3/uL (ref 1.0–3.6)
MCH: 31.4 pg (ref 26.0–34.0)
MCHC: 32.9 g/dL (ref 32.0–36.0)
MCV: 95.5 fL (ref 80.0–100.0)
MONOS PCT: 11 %
Monocytes Absolute: 0.9 10*3/uL (ref 0.2–1.0)
NEUTROS PCT: 67 %
Neutro Abs: 5.4 10*3/uL (ref 1.4–6.5)
PLATELETS: 427 10*3/uL (ref 150–440)
RBC: 3.28 MIL/uL — AB (ref 4.40–5.90)
RDW: 16.5 % — ABNORMAL HIGH (ref 11.5–14.5)
WBC: 8 10*3/uL (ref 3.8–10.6)

## 2017-11-23 LAB — COMPREHENSIVE METABOLIC PANEL
ALBUMIN: 2.8 g/dL — AB (ref 3.5–5.0)
ALK PHOS: 73 U/L (ref 38–126)
ALT: 8 U/L (ref 0–44)
AST: 18 U/L (ref 15–41)
Anion gap: 9 (ref 5–15)
BUN: 16 mg/dL (ref 8–23)
CALCIUM: 8.9 mg/dL (ref 8.9–10.3)
CO2: 29 mmol/L (ref 22–32)
CREATININE: 1.2 mg/dL (ref 0.61–1.24)
Chloride: 104 mmol/L (ref 98–111)
GFR calc Af Amer: >60 mL/min (ref >60)
GFR, EST NON AFRICAN AMERICAN: 56 mL/min — AB (ref >60)
GLUCOSE: 95 mg/dL (ref 70–99)
POTASSIUM: 3.7 mmol/L (ref 3.5–5.1)
Sodium: 142 mmol/L (ref 135–145)
TOTAL PROTEIN: 8.1 g/dL (ref 6.5–8.1)
Total Bilirubin: 0.3 mg/dL (ref 0.3–1.2)

## 2017-11-23 MED ORDER — SODIUM CHLORIDE 0.9 % IV SOLN
Freq: Once | INTRAVENOUS | Status: AC
  Administered 2017-11-23: 10:00:00 via INTRAVENOUS
  Filled 2017-11-23: qty 250

## 2017-11-23 MED ORDER — SODIUM CHLORIDE 0.9 % IV SOLN
1200.0000 mg | Freq: Once | INTRAVENOUS | Status: AC
  Administered 2017-11-23: 1200 mg via INTRAVENOUS
  Filled 2017-11-23: qty 20

## 2017-11-23 NOTE — Progress Notes (Signed)
Well Chemo Visit  Subjective:  Patient ID: Collin Gonzales, male    DOB: 09-20-1935  Age: 82 y.o. MRN: [id number]  CC: [place] presents to [name] for initial meeting and discussion in preparation of starting chemotherapy. We discussed that the role of the [facility] is to provide additional resources and assistance those who may have an increased risk for complications during the course of chemotherapy. High risk factors may include advanced age, performance status and/or comorbid conditions such as hypertension, DM and CKD.  We discussed the relationship with her primary care physician, available insurance, financial concerns/needs, access to medications and potential transportation issues.  Discussed that the goal of this program is to help prevent unplanned ER visits and to help reduce complications during chemotherapy. We introduced symptom management clinic and their availability for same day appointment should problems arise here at the cancer center.   Patient was seen by Dr. Erlene Gonzales his urologist for TURP (11/01/17) for a diagnosis of urinary retention after having several UTI's.  Was discharged the next day after meeting discharge criteria.  Had uneventful stay.  Unfortunately, surgical pathology revealed high-grade invasive urothelial carcinoma involving the prostate with squamous differentiation.   Has personal history of prostate cancer s/p brachii therapy in 2006 and lung cancer.  Recent PET scan for work-up of newly diagnosed urothelial carcinoma revealed highly suspicious for underlying malignancy.  He is status post SBRT in May 2017.  He was seen for initial consultation with Dr. Grayland Gonzales given new cancer diagnosed on 11/16/2017.  He was discussed in multidisciplinary tumor board where unfortunately he was not considered a surgical candidate nor could he receive additional XRT given the history of prostate cancer and brachii therapy seed  placement.  Given his performance status, it is unlikely he was going to tolerate platinum based therapy.  Plan was to initiate immunotherapy with Tecentriq every 3 weeks.  He would require periodic escapades to assess for therapy response.  He was scheduled to begin treatment 11/23/2017.  History Patient Active Problem List   Diagnosis Date Noted  . Urothelial carcinoma of bladder ([place]) 11/12/2017  . Urinary retention 11/01/2017  . Dependence on supplemental oxygen 04/24/2017  . Malnutrition ([place]) 04/24/2017  . Hemiparesis affecting left side as late effect of cerebrovascular accident (CVA) ([place]) 04/24/2017  . Cataract 09/06/2016  . Drug-induced folate deficiency anemia 09/06/2016  . Esophagitis, reflux 09/06/2016  . Hypogammaglobulinemia ([place]) 09/06/2016  . Iron deficiency anemia due to chronic blood loss 08/23/2016  . Pancytopenia ([place]) 08/22/2016  . History of stroke with residual effects 06/06/2016  . Decrease in appetite 09/01/2015  . GERD (gastroesophageal reflux disease) 08/05/2015  . COPD (chronic obstructive pulmonary disease) ([place]) 08/20/2014  . Dyslipidemia 08/20/2014  . Chronic insomnia 08/20/2014  . Lung cancer ([place]) 08/20/2014  . Arthritis or polyarthritis, rheumatoid ([place]) 08/20/2014  . Prostate cancer ([place]) 11/01/2013  . Flutter-fibrillation ([place]) 03/18/2005   Past Medical History:  Diagnosis Date  . Arthritis   . Collagen vascular disease (HCC)    rheumatoid arthritis  . COPD (chronic obstructive pulmonary disease) ([place])   . GERD (gastroesophageal reflux disease)   . History of prostate cancer   . HLD (hyperlipidemia)   . HOH (hard of hearing)   . Insomnia   . Lung cancer ([place]) 2017   rad tx's.  . Oxygen deficit    2L HS AND PRN  . Prostate cancer ([place]) 2006   Rad seed tx's.  Marland Kitchen  Stroke [facility])    2000   Past Surgical History:  Procedure Laterality Date  . APPENDECTOMY  1965  . CATARACT EXTRACTION W/PHACO Right 01/05/2016   Procedure: CATARACT  EXTRACTION PHACO AND INTRAOCULAR LENS PLACEMENT (IOC);  Surgeon: [name], MD;  Location: [facility] ORS;  Service: Ophthalmology;  Laterality: Right;  [place] 1.14 AP% 18.1 CDE 13.44 FLUID PACK LOT # [id number]  . CYSTOSCOPY W/ RETROGRADES Bilateral 11/01/2017   Procedure: CYSTOSCOPY WITH RETROGRADE PYELOGRAM;  Surgeon: Hollice Espy, MD;  Location: [facility] ORS;  Service: Urology;  Laterality: Bilateral;  . ENDOBRONCHIAL ULTRASOUND N/A 02/26/2015   Procedure: ENDOBRONCHIAL ULTRASOUND;  Surgeon: Flora Lipps, MD;  Location: [facility] ORS;  Service: Cardiopulmonary;  Laterality: N/A;  . [place]  . INSERTION PROSTATE RADIATION SEED  2002  . TRANSURETHRAL RESECTION OF PROSTATE N/A 11/01/2017   Procedure: TRANSURETHRAL RESECTION OF THE PROSTATE (TURP) (channel TURP);  Surgeon: Hollice Espy, MD;  Location: [facility] ORS;  Service: Urology;  Laterality: N/A;   Allergies  Allergen Reactions  . Plaquenil [Hydroxychloroquine]     Bad dreams  . Simvastatin Other (See Comments)    Bad dreams   Prior to Admission medications   Medication Sig Start Date End Date Taking? Authorizing Provider  folic acid (FOLVITE) 1 MG tablet Take 1 tablet (1 mg total) by mouth daily. 08/24/16   Lloyd Huger, MD  methotrexate (RHEUMATREX) 2.5 MG tablet Take 5 tablets by mouth once a week. 10/05/17   [name], MD  omeprazole (PRILOSEC) 40 MG capsule Take 1 capsule (40 mg total) by mouth daily. Patient not taking: Reported on 11/23/2017 08/05/15   Roselee Nova, MD  OXYGEN Place 2 L into the nose daily as needed.     [provider]  prochlorperazine (COMPAZINE) 10 MG tablet Take 1 tablet (10 mg total) by mouth every 6 (six) hours as needed (Nausea or vomiting). Patient not taking: Reported on 11/23/2017 11/17/17   Lloyd Huger, MD  tamsulosin (FLOMAX) 0.4 MG CAPS capsule Take 1 capsule (0.4 mg total) by mouth daily. Patient taking differently: Take 0.8 mg by mouth daily.  02/02/17   [name], MD  traZODone (DESYREL) 100 MG tablet TAKE 1 TABLET BY MOUTH EVERYDAY AT BEDTIME 08/04/17   [name], MD   Social History   Socioeconomic History  . Marital status: Divorced    Spouse name: Not on file  . Number of children: 5  . Years of education: Not on file  . Highest education level: 12th grade  Occupational History  . Occupation: Retired  Scientific laboratory technician  . Financial resource strain: Not hard at all  . Food insecurity:    Worry: Never true    Inability: Never true  . Transportation needs:    Medical: No    Non-medical: No  Tobacco Use  . Smoking status: Former Smoker    Packs/day: 0.50    Years: 50.00    Pack years: 25.00    Types: Cigarettes    Last attempt to quit: 02/21/2013    Years since quitting: 4.7  . Smokeless tobacco: Never Used  . Tobacco comment: smoking cessation materials not required  Substance and Sexual Activity  . Alcohol use: No    Alcohol/week: 0.0 standard drinks  . Drug use: No  . Sexual activity: Never  Lifestyle  . Physical activity:    Days per week: 0 days    Minutes per session: 0 min  . Stress: Not at  all  Relationships  . Social connections:    Talks on phone: Patient refused    Gets together: Patient refused    Attends religious service: Patient refused    Active member of club or organization: Patient refused    Attends meetings of clubs or organizations: Patient refused    Relationship status: Divorced  . Intimate partner violence:    Fear of current or ex partner: No    Emotionally abused: No    Physically abused: No    Forced sexual activity: No  Other Topics Concern  . Not on file  Social History Narrative  . Not on file    Review of Systems  Constitutional: Negative.  Negative for appetite change, chills, fatigue and fever.  HENT: Negative.  Negative for congestion, mouth sores, nosebleeds, sinus pain and sore throat.   Eyes: Negative.   Respiratory: Positive for shortness of breath. Negative for  cough and wheezing.   Cardiovascular: Negative.  Negative for chest pain and leg swelling.  Gastrointestinal: Negative.  Negative for abdominal distention, abdominal pain, blood in stool, constipation, diarrhea, nausea and vomiting.  Endocrine: Negative.   Genitourinary: Negative.  Negative for dysuria, flank pain, frequency, hematuria and urgency.  Musculoskeletal: Negative.  Negative for arthralgias and back pain.  Skin: Negative.  Negative for pallor.  Allergic/Immunologic: Negative.   Neurological: Negative.  Negative for dizziness, weakness and headaches.  Hematological: Negative.  Negative for adenopathy.  Psychiatric/Behavioral: Negative.  Negative for confusion. The patient is not nervous/anxious.     Objective:  There were no vitals taken for this visit.  Physical Exam  Constitutional: He is oriented to person, place, and time. Vital signs are normal. He appears well-developed and well-nourished.  HENT:  Head: Normocephalic and atraumatic.  Eyes: Pupils are equal, round, and reactive to light.  Neck: Normal range of motion.  Cardiovascular: Normal rate, regular rhythm and normal heart sounds.  No murmur heard. Pulmonary/Chest: Effort normal and breath sounds normal. He has no wheezes.  Abdominal: Soft. Normal appearance and bowel sounds are normal. He exhibits no distension. There is no tenderness.  Musculoskeletal: Normal range of motion. He exhibits no edema.  Neurological: He is alert and oriented to person, place, and time.  Skin: Skin is warm and dry. No rash noted.  Psychiatric: Judgment normal.  Nursing note and vitals reviewed.   Assessment & Plan:  Collin Gonzales is a 82 y.o. male  Who presents for chemo care and first cycle of Keytruda.  He is accompanied by his daughter.   Urothelial carcinoma: Recently diagnosed Dr. Erlene Gonzales. S/p TURP for urinary retention and frequent recurrent UTIs.  Pathology revealed high-grade urothelial carcinoma.  Unfortunately,  patient is not a surgical candidate nor can he receive additional XRT given history of prostate cancer and brachii therapy with seed placement.  Given performance status, Dr. Grayland Gonzales did not recommend him based chemotherapy.  After discussion multidisciplinary tumor board, he will began immunotherapy with Tecentriq every 3 weeks with periodic cystoscopies to assess for therapy response.  Scheduled to begin immunotherapy on 11/23/2017.  History of upper lobe lung mass: Had previous biopsy which was negative.  He is status post SBRT in May 2017 after a positive PET scan.  Repeat PET imaging from 11/22/2017 revealed mild metabolic activity that was favored to be benign postradiation change.  There was no evidence of metastatic disease.  Prostate cancer: History of brachii therapy with seed placement. S/p TURP by Dr. Erlene Gonzales.  Urinary retention/frequent UTIs: Continues Flomax 0.4  mg prescribed by Dr. Erlene Gonzales.  RA: Sees Dr. Jefm Bryant.  Takes methotrexate once a week.  He denies joint pains or swelling.  Insomnia: On trazodone.  Has been on this for years.  At this time, he denies any social or financial concerns.  He has stable transportation, either he drives himself or family member will drive him.  Was recently prescribed Compazine by Dr. Grayland Gonzales which he has already picked up.  He does not have any questions about this medication.   Greater than 50% was spent in counseling and coordination of care with this patient including but not limited to discussion of the relevant topics above (See A&P) including, but not limited to diagnosis and management of acute and chronic medical conditions.   Urothelial carcinoma of bladder ([place]) No orders of the defined types were placed in this encounter.  There are no Patient Instructions on file for this visit.  Faythe Casa, NP 11/23/2017 2:03 PM

## 2017-11-23 NOTE — Progress Notes (Signed)
Per pt " feeling pretty good " stated today in tx number 1 .

## 2017-11-24 ENCOUNTER — Ambulatory Visit (INDEPENDENT_AMBULATORY_CARE_PROVIDER_SITE_OTHER): Payer: Medicare HMO | Admitting: Family Medicine

## 2017-11-24 ENCOUNTER — Encounter: Payer: Self-pay | Admitting: Family Medicine

## 2017-11-24 VITALS — BP 112/68 | HR 87 | Temp 98.1°F | Resp 16 | Ht 68.0 in | Wt 122.5 lb

## 2017-11-24 DIAGNOSIS — C675 Malignant neoplasm of bladder neck: Secondary | ICD-10-CM

## 2017-11-24 DIAGNOSIS — J449 Chronic obstructive pulmonary disease, unspecified: Secondary | ICD-10-CM | POA: Diagnosis not present

## 2017-11-24 DIAGNOSIS — I69354 Hemiplegia and hemiparesis following cerebral infarction affecting left non-dominant side: Secondary | ICD-10-CM

## 2017-11-24 DIAGNOSIS — E440 Moderate protein-calorie malnutrition: Secondary | ICD-10-CM

## 2017-11-24 DIAGNOSIS — I700 Atherosclerosis of aorta: Secondary | ICD-10-CM

## 2017-11-24 DIAGNOSIS — F5104 Psychophysiologic insomnia: Secondary | ICD-10-CM | POA: Diagnosis not present

## 2017-11-24 DIAGNOSIS — K219 Gastro-esophageal reflux disease without esophagitis: Secondary | ICD-10-CM

## 2017-11-24 DIAGNOSIS — M069 Rheumatoid arthritis, unspecified: Secondary | ICD-10-CM

## 2017-11-24 DIAGNOSIS — Z9981 Dependence on supplemental oxygen: Secondary | ICD-10-CM

## 2017-11-24 DIAGNOSIS — Z23 Encounter for immunization: Secondary | ICD-10-CM

## 2017-11-24 LAB — THYROID PANEL WITH TSH
FREE THYROXINE INDEX: 2.2 (ref 1.2–4.9)
T3 Uptake Ratio: 29 % (ref 24–39)
T4 TOTAL: 7.7 ug/dL (ref 4.5–12.0)
TSH: 2.96 u[IU]/mL (ref 0.450–4.500)

## 2017-11-24 MED ORDER — FLUTICASONE-UMECLIDIN-VILANT 100-62.5-25 MCG/INH IN AEPB: 1.0000 | INHALATION_SPRAY | Freq: Every day | RESPIRATORY_TRACT | 5 refills | Status: DC

## 2017-11-24 NOTE — Progress Notes (Signed)
Name: Collin Gonzales   MRN: [id number]    DOB: Jul 10, 1935   Date:11/24/2017       Progress Note  Subjective  Chief Complaint  Chief Complaint  Patient presents with  . Medication Refill  . COPD    Wears Oxygen but left it in the car- sees Dr. Raul Del  . Gastroesophageal Reflux  . Insomnia  . Malnutrition Screening Tool    Eating 2 full meals a day and 2 snacks daily    HPI  Malnutrition: his weight has gone down from 140 lbs 04/2015 down to 112 lbs June 2016 but is improving, he has been increasing portion size and has gained back to 122 lbs today. He has tried increasing calorie intake also  He has temporal waisting    COPD/emphysema: history of CAP back 02/2017, he is doing better now, back to baseline cough and SOB, he is oxygen dependent, he forgot his oxygen in the car today and pulse ox was down to 86 % when he arrived. He denies wheezing. He was treated for right lung cancer, treated by Dr. Baruch Gouty with radiation and is only being monitored now.  He is using Trelegy and no longer seeing Dr. Raul Del , he needs refill of Trelegy   Atherosclerosis of aorta and coronary vessel disease: found on CT scan done 07/2016. He states he stopped taking statin and aspirin.  Discussed cardiovascular risk. He also has a history of CVA, with weakness of left forefoot, but he denies any gait instability  Prostate Cancer/bladder cancer history of prostate seed implants, he has recurrent UTIs and recent urinary retention, under the care of Dr. Pilar Jarvis and [name]   GERD: he has been off omeprazole, no heartburn at this time  RA: seeing Dr. Jefm Bryant, takes methothrexate once a week, down to 5pills per week,  denies joint pains or swelling, no pain at this time  Chronic insomnia: he is on Trazodone for years, he states he watches TV until late at night, he states it is taking longer for him to fall asleep, advised to take medication one hour before bedtime and monitor    History of CVA  stable weakness on left side, refuses statin therapy and aspirin  Patient Active Problem List   Diagnosis Date Noted  . Urothelial carcinoma of bladder ([place]) 11/12/2017  . Urinary retention 11/01/2017  . Dependence on supplemental oxygen 04/24/2017  . Malnutrition ([place]) 04/24/2017  . Hemiparesis affecting left side as late effect of cerebrovascular accident (CVA) ([place]) 04/24/2017  . Cataract 09/06/2016  . Drug-induced folate deficiency anemia 09/06/2016  . Esophagitis, reflux 09/06/2016  . Hypogammaglobulinemia ([place]) 09/06/2016  . Iron deficiency anemia due to chronic blood loss 08/23/2016  . Pancytopenia ([place]) 08/22/2016  . History of stroke with residual effects 06/06/2016  . Decrease in appetite 09/01/2015  . GERD (gastroesophageal reflux disease) 08/05/2015  . COPD (chronic obstructive pulmonary disease) ([place]) 08/20/2014  . Dyslipidemia 08/20/2014  . Chronic insomnia 08/20/2014  . Lung cancer ([place]) 08/20/2014  . Arthritis or polyarthritis, rheumatoid ([place]) 08/20/2014  . Prostate cancer ([place]) 11/01/2013  . Flutter-fibrillation ([place]) 03/18/2005    Past Surgical History:  Procedure Laterality Date  . APPENDECTOMY  1965  . CATARACT EXTRACTION W/PHACO Right 01/05/2016   Procedure: CATARACT EXTRACTION PHACO AND INTRAOCULAR LENS PLACEMENT (IOC);  Surgeon: [name], MD;  Location: [facility] ORS;  Service: Ophthalmology;  Laterality: Right;  [place] 1.14 AP% 18.1 CDE 13.44 FLUID PACK LOT # [id number] H  . CYSTOSCOPY W/ RETROGRADES Bilateral 11/01/2017  Procedure: CYSTOSCOPY WITH RETROGRADE PYELOGRAM;  Surgeon: Hollice Espy, MD;  Location: [facility];  Service: Urology;  Laterality: Bilateral;  . ENDOBRONCHIAL ULTRASOUND N/A 02/26/2015   Procedure: ENDOBRONCHIAL ULTRASOUND;  Surgeon: Flora Lipps, MD;  Location: [facility] OR[facility];  Service: Cardiopulmonary;  Laterality: N/A;  . [place]  . INSERTION PROSTATE RADIATION SEED  2002  . TRANSURETHRAL RESECTION OF PROSTATE N/A 11/01/2017    Procedure: TRANSURETHRAL RESECTION OF THE PROSTATE (TURP) (channel TURP);  Surgeon: Hollice Espy, MD;  Location: [facility] OR[facility];  Service: Urology;  Laterality: N/A;    Family History  Problem Relation Age of Onset  . Cancer Mother   . Cancer Father        Lung Cancer  . Cancer Sister        breast  . Cancer Brother        stomach    Social History   Socioeconomic History  . Marital status: Divorced    Spouse name: Not on file  . Number of children: 5  . Years of education: Not on file  . Highest education level: 12th grade  Occupational History  . Occupation: Retired  Scientific laboratory technician  . Financial resource strain: Not hard at all  . Food insecurity:    Worry: Never true    Inability: Never true  . Transportation needs:    Medical: No    Non-medical: No  Tobacco Use  . Smoking status: Former Smoker    Packs/day: 0.50    Years: 50.00    Pack years: 25.00    Types: Cigarettes    Last attempt to quit: 02/21/2013    Years since quitting: 4.7  . Smokeless tobacco: Never Used  . Tobacco comment: smoking cessation materials not required  Substance and Sexual Activity  . Alcohol use: No    Alcohol/week: 0.0 standard drinks  . Drug use: No  . Sexual activity: Never  Lifestyle  . Physical activity:    Days per week: 0 days    Minutes per session: 0 min  . Stress: Not at all  Relationships  . Social connections:    Talks on phone: Patient refused    Gets together: Patient refused    Attends religious service: Patient refused    Active member of club or organization: Patient refused    Attends meetings of clubs or organizations: Patient refused    Relationship status: Divorced  . Intimate partner violence:    Fear of current or ex partner: No    Emotionally abused: No    Physically abused: No    Forced sexual activity: No  Other Topics Concern  . Not on file  Social History Narrative  . Not on file     Current Outpatient Medications:  .  folic acid (FOLVITE) 1 MG  tablet, Take 1 tablet (1 mg total) by mouth daily., Disp: 30 tablet, Rfl: 5 .  methotrexate (RHEUMATREX) 2.5 MG tablet, Take 5 tablets by mouth once a week., Disp: , Rfl:  .  OXYGEN, Place 2 L into the nose daily as needed. , Disp: , Rfl:  .  tamsulosin (FLOMAX) 0.4 MG CAPS capsule, Take 1 capsule (0.4 mg total) by mouth daily. (Patient taking differently: Take 0.8 mg by mouth daily. ), Disp: 30 capsule, Rfl: 11 .  traZODone (DESYREL) 100 MG tablet, TAKE 1 TABLET BY MOUTH EVERYDAY AT BEDTIME, Disp: 90 tablet, Rfl: 1 .  omeprazole (PRILOSEC) 40 MG capsule, Take 1 capsule (40 mg total) by mouth daily. (Patient  not taking: Reported on 11/24/2017), Disp: 90 capsule, Rfl: 1 .  prochlorperazine (COMPAZINE) 10 MG tablet, Take 1 tablet (10 mg total) by mouth every 6 (six) hours as needed (Nausea or vomiting). (Patient not taking: Reported on 11/24/2017), Disp: 60 tablet, Rfl: 2  Allergies  Allergen Reactions  . Plaquenil [Hydroxychloroquine]     Bad dreams  . Simvastatin Other (See Comments)    Bad dreams    I personally reviewed active problem list, medication list, allergies, family history, social history with the patient/caregiver today.   ROS  Constitutional: Negative for fever, positive for mild weight change.  Respiratory: positive  for cough and shortness of breath.   Cardiovascular: Negative for chest pain or palpitations.  Gastrointestinal: Negative for abdominal pain, no bowel changes.  Musculoskeletal: Positive  for gait problem but no  joint swelling.  Skin: Negative for rash.  Neurological: Negative for dizziness or headache.  No other specific complaints in a complete review of systems (except as listed in HPI above).  Objective  Vitals:   11/24/17 1336  Pulse: 87  Resp: 16  Temp: 98.1 F (36.7 C)  TempSrc: Oral  SpO2: (!) 82%  Weight: 122 lb 8 oz (55.6 kg)  Height: '5\' 8"'  (1.727 m)    Body mass index is 18.63 kg/m.  Physical Exam  Constitutional: Patient appears  well-developed and temporal waiting, very frail   HEENT: head atraumatic, normocephalic, on nasal canula oxygen ( we gave it from our oxygen and pulse ox went up to 98 %)  neck supple, throat within normal limits Cardiovascular: Normal rate, regular rhythm and normal heart sounds.  No murmur heard. No BLE edema. Pulmonary/Chest: Effort normal and breath sounds normal. Mild respiratory distress - labored breathing  Abdominal: Soft.  There is no tenderness. Psychiatric: Patient has a normal mood and affect. behavior is normal. Judgment and thought content normal.  Recent Results (from the past 2160 hour(s))  Urinalysis, Complete     Status: Abnormal   Collection Time: 09/13/17  8:44 AM  Result Value Ref Range   Specific Gravity, UA 1.025 1.005 - 1.030   pH, UA 6.0 5.0 - 7.5   Color, UA Yellow Yellow   Appearance Ur Cloudy (A) Clear   Leukocytes, UA 2+ (A) Negative   Protein, UA 2+ (A) Negative/Trace   Glucose, UA Negative Negative   Ketones, UA Trace (A) Negative   RBC, UA 2+ (A) Negative   Bilirubin, UA Negative Negative   Urobilinogen, Ur 0.2 0.2 - 1.0 mg/dL   Nitrite, UA Positive (A) Negative   Microscopic Examination See below:   Microscopic Examination     Status: Abnormal   Collection Time: 09/13/17  8:44 AM  Result Value Ref Range   WBC, UA >30 (H) 0 - 5 /hpf   RBC, UA 11-30 (A) 0 - 2 /hpf   Epithelial Cells (non renal) 0-10 0 - 10 /hpf   Mucus, UA Present (A) Not Estab.   Bacteria, UA Many (A) None seen/Few  PSA     Status: None   Collection Time: 09/13/17  8:46 AM  Result Value Ref Range   Prostate Specific Ag, Serum <0.1 0.0 - 4.0 ng/mL    Comment: Roche ECLIA methodology. According to the American Urological Association, Serum PSA should decrease and remain at undetectable levels after radical prostatectomy. The AUA defines biochemical recurrence as an initial PSA value 0.2 ng/mL or greater followed by a subsequent confirmatory PSA value 0.2 ng/mL or greater. Values  obtained with  different assay methods or kits cannot be used interchangeably. Results cannot be interpreted as absolute evidence of the presence or absence of malignant disease.   Bladder Scan (Post Void Residual) in office     Status: None   Collection Time: 09/13/17  8:55 AM  Result Value Ref Range   Scan Result >410   CULTURE, URINE COMPREHENSIVE     Status: Abnormal   Collection Time: 09/13/17  9:23 AM  Result Value Ref Range   Urine Culture, Comprehensive Final report (A)    Organism ID, Bacteria Escherichia coli (A)     Comment: Greater than 100,000 colony forming units per mL   ANTIMICROBIAL SUSCEPTIBILITY Comment     Comment:       ** S = Susceptible; I = Intermediate; R = Resistant **                    P = Positive; N = Negative             MICS are expressed in micrograms per mL    Antibiotic                 RSLT#1    RSLT#2    RSLT#3    RSLT#4 Amoxicillin/Clavulanic Acid    I Ampicillin                     R Cefepime                       S Ceftriaxone                    S Cefuroxime                     I Ciprofloxacin                  R Ertapenem                      S Gentamicin                     S Imipenem                       S Levofloxacin                   R Meropenem                      S Nitrofurantoin                 S Piperacillin/Tazobactam        S Tetracycline                   R Tobramycin                     S Trimethoprim/Sulfa             R   CULTURE, URINE COMPREHENSIVE     Status: Abnormal   Collection Time: 10/03/17  2:09 PM  Result Value Ref Range   Urine Culture, Comprehensive Final report (A)    Organism ID, Bacteria Escherichia coli (A)     Comment: Greater than 100,000 colony forming units per mL   ANTIMICROBIAL SUSCEPTIBILITY Comment     Comment:       ** S = Susceptible; I =  Intermediate; R = Resistant **                    P = Positive; N = Negative             MICS are expressed in micrograms per mL    Antibiotic                  RSLT#1    RSLT#2    RSLT#3    RSLT#4 Amoxicillin/Clavulanic Acid    I Ampicillin                     R Cefepime                       S Ceftriaxone                    S Cefuroxime                     I Ciprofloxacin                  R Ertapenem                      S Gentamicin                     S Imipenem                       S Levofloxacin                   R Meropenem                      S Nitrofurantoin                 S Piperacillin/Tazobactam        S Tetracycline                   R Tobramycin                     S Trimethoprim/Sulfa             R   CBC with Differential/Platelet     Status: Abnormal   Collection Time: 10/11/17 12:39 PM  Result Value Ref Range   WBC 5.2 3.8 - 10.8 Thousand/uL   RBC 3.65 (L) 4.20 - 5.80 Million/uL   Hemoglobin 11.1 (L) 13.2 - 17.1 g/dL   HCT 33.8 (L) 38.5 - 50.0 %   MCV 92.6 80.0 - 100.0 fL   MCH 30.4 27.0 - 33.0 pg   MCHC 32.8 32.0 - 36.0 g/dL   RDW 13.5 11.0 - 15.0 %   Platelets 302 140 - 400 Thousand/uL   MPV 10.1 7.5 - 12.5 fL   Neutro Abs 3,604 1,500 - 7,800 cells/uL   Lymphs Abs 967 850 - 3,900 cells/uL   WBC mixed population 530 200 - 950 cells/uL   Eosinophils Absolute 68 15 - 500 cells/uL   Basophils Absolute 31 0 - 200 cells/uL   Neutrophils Relative % 69.3 %   Total Lymphocyte 18.6 %   Monocytes Relative 10.2 %   Eosinophils Relative 1.3 %   Basophils Relative 0.6 %  COMPLETE METABOLIC PANEL WITH GFR     Status: Abnormal   Collection Time: 10/11/17 12:39 PM  Result Value Ref Range  Glucose, Bld 95 65 - 139 mg/dL    Comment: .        Non-fasting reference interval .    BUN 16 7 - 25 mg/dL   Creat 1.17 0.70 - 1.18 mg/dL    Comment: For patients >79 years of age, the reference limit for Creatinine is approximately 13% higher for people identified as African-American. .    GFR, Est Non African American 59 (L) > OR = 60 mL/min/1.41m   GFR, Est African American 68 > OR = 60 mL/min/1.759m  BUN/Creatinine  Ratio NOT APPLICABLE 6 - 22 (calc)   Sodium 137 135 - 146 mmol/L   Potassium 3.9 3.5 - 5.3 mmol/L   Chloride 100 98 - 110 mmol/L   CO2 27 20 - 32 mmol/L   Calcium 9.2 8.6 - 10.3 mg/dL   Total Protein 8.3 (H) 6.1 - 8.1 g/dL   Albumin 3.3 (L) 3.6 - 5.1 g/dL   Globulin 5.0 (H) 1.9 - 3.7 g/dL (calc)   AG Ratio 0.7 (L) 1.0 - 2.5 (calc)   Total Bilirubin 0.3 0.2 - 1.2 mg/dL   Alkaline phosphatase (APISO) 89 40 - 115 U/L   AST 14 10 - 35 U/L   ALT 5 (L) 9 - 46 U/L  Lipid panel     Status: None   Collection Time: 10/11/17 12:39 PM  Result Value Ref Range   Cholesterol 150 <200 mg/dL   HDL 51 >40 mg/dL   Triglycerides 55 <150 mg/dL   LDL Cholesterol (Calc) 85 mg/dL (calc)    Comment: Reference range: <100 . Desirable range <100 mg/dL for primary prevention;   <70 mg/dL for patients with CHD or diabetic patients  with > or = 2 CHD risk factors. . Marland KitchenDL-C is now calculated using the Martin-Hopkins  calculation, which is a validated novel method providing  better accuracy than the Friedewald equation in the  estimation of LDL-C.  MaCresenciano Genret al. JAAnnamaria [id number];073(71 [id number]  (http://education.[employer].com/faq/FAQ164)    Total CHOL/HDL Ratio 2.9 <5.0 (calc)   Non-HDL Cholesterol (Calc) 99 <130 mg/dL (calc)    Comment: For patients with diabetes plus 1 major ASCVD risk  factor, treating to a non-HDL-C goal of <100 mg/dL  (LDL-C of <70 mg/dL) is considered a therapeutic  option.   Urinalysis, Complete     Status: Abnormal   Collection Time: 10/18/17 10:47 AM  Result Value Ref Range   Specific Gravity, UA >1.030 (H) 1.005 - 1.030   pH, UA 6.0 5.0 - 7.5   Color, UA Yellow Yellow   Appearance Ur Cloudy (A) Clear   Leukocytes, UA 1+ (A) Negative   Protein, UA 2+ (A) Negative/Trace   Glucose, UA Negative Negative   Ketones, UA Negative Negative   RBC, UA 2+ (A) Negative   Bilirubin, UA Negative Negative   Urobilinogen, Ur 0.2 0.2 - 1.0 mg/dL   Nitrite, UA Negative Negative    Microscopic Examination See below:   Microscopic Examination     Status: Abnormal   Collection Time: 10/18/17 10:47 AM  Result Value Ref Range   WBC, UA 11-30 (A) 0 - 5 /hpf   RBC, UA 11-30 (A) 0 - 2 /hpf   Epithelial Cells (non renal) 0-10 0 - 10 /hpf   Mucus, UA Present (A) Not Estab.   Bacteria, UA Many (A) None seen/Few  CULTURE, URINE COMPREHENSIVE     Status: Abnormal   Collection Time: 10/18/17 11:10 AM  Result Value Ref Range   Urine  Culture, Comprehensive Final report (A)    Organism ID, Bacteria Escherichia coli (A)     Comment: Greater than 100,000 colony forming units per mL   ANTIMICROBIAL SUSCEPTIBILITY Comment     Comment:       ** S = Susceptible; I = Intermediate; R = Resistant **                    P = Positive; N = Negative             MICS are expressed in micrograms per mL    Antibiotic                 RSLT#1    RSLT#2    RSLT#3    RSLT#4 Amoxicillin/Clavulanic Acid    I Ampicillin                     R Cefepime                       S Ceftriaxone                    S Cefuroxime                     I Ciprofloxacin                  R Ertapenem                      S Gentamicin                     S Imipenem                       S Levofloxacin                   R Meropenem                      S Nitrofurantoin                 R Piperacillin/Tazobactam        S Tetracycline                   R Tobramycin                     S Trimethoprim/Sulfa             R   CBC     Status: Abnormal   Collection Time: 10/26/17 12:59 PM  Result Value Ref Range   WBC 6.1 3.8 - 10.6 K/uL   RBC 3.67 (L) 4.40 - 5.90 MIL/uL   Hemoglobin 11.5 (L) 13.0 - 18.0 g/dL   HCT 35.3 (L) 40.0 - 52.0 %   MCV 96.1 80.0 - 100.0 fL   MCH 31.4 26.0 - 34.0 pg   MCHC 32.6 32.0 - 36.0 g/dL   RDW 15.8 (H) 11.5 - 14.5 %   Platelets 273 150 - 440 K/uL    Comment: Performed at [facility], [address], [place], [place] [address]  Surgical pathology     Status: None    Collection Time: 11/01/17  2:05 PM  Result Value Ref Range   SURGICAL PATHOLOGY      Surgical Pathology CASE: [id number] PATIENT: Kenney Houseman Surgical Pathology Report     SPECIMEN  SUBMITTED: A. Prostate chips  CLINICAL HISTORY: 82 y.o. patient with history of prostate cancer (2006) status post brachy therapy with urinary retention and significantly irregular necrotic prostatic fossa.  PRE-OPERATIVE DIAGNOSIS: Urinary retention  POST-OPERATIVE DIAGNOSIS: Same as pre-op     DIAGNOSIS: A. PROSTATE CHIPS; TURP: - HIGH-GRADE UROTHELIAL CARCINOMA WITH SQUAMOUS DIFFERENTIATION, FOCAL KERATINIZATION, AND EXTENSIVE NECROSIS. - PERINEURAL INVASION IS PRESENT.  Comment: These findings were communicated to Dr. Erlene Quan on 11/03/2017 via [facility] in-basket.  GROSS DESCRIPTION: A. Labeled: Prostate chips Received: In formalin Type of procedure: TURP Weight: 7 n grams Size: Aggregate, 4.6 x 3.7 x 0.6 cm Description: Rubbery pink-tan-yellow tissue fragments (no foreign material identified in the specimen grossly) Percentage of tissue submit ted for microscopic review: 100% %  Block summary: 1-7 - entirely submitted  Final Diagnosis performed by Quay Burow, MD.   Electronically signed 11/03/2017 2:41:47PM The electronic signature indicates that the named Attending Pathologist has evaluated the specimen  Technical component performed at [facility], [address], [place], [place] [address] Lab: [phone] Dir: Rush Farmer, MD, MMM  Professional component performed at [facility], [facility], [place], [place], [place] [address] Lab: [phone] Dir: Dellia Nims. Rubinas, MD   CBC     Status: Abnormal   Collection Time: 11/02/17  3:41 AM  Result Value Ref Range   WBC 3.8 3.8 - 10.6 K/uL   RBC 2.77 (L) 4.40 - 5.90 MIL/uL   Hemoglobin 9.1 (L) 13.0 - 18.0 g/dL   HCT 26.7 (L) 40.0 - 52.0 %   MCV 96.4 80.0 - 100.0 fL   MCH 32.8 26.0 - 34.0 pg   MCHC  34.0 32.0 - 36.0 g/dL   RDW 15.2 (H) 11.5 - 14.5 %   Platelets 125 (L) 150 - 440 K/uL    Comment: Performed at [facility], [address] [place], [place] [address]  Basic metabolic panel     Status: Abnormal   Collection Time: 11/02/17  3:41 AM  Result Value Ref Range   Sodium 138 135 - 145 mmol/L   Potassium 3.6 3.5 - 5.1 mmol/L   Chloride 109 98 - 111 mmol/L   CO2 26 22 - 32 mmol/L   Glucose, Bld 92 70 - 99 mg/dL   BUN 19 8 - 23 mg/dL   Creatinine, Ser 1.04 0.61 - 1.24 mg/dL   Calcium 7.8 (L) 8.9 - 10.3 mg/dL   GFR calc non Af Amer >60 >60 mL/min   GFR calc Af Amer >60 >60 mL/min    Comment: (NOTE) The eGFR has been calculated using the CKD EPI equation. This calculation has not been validated in all clinical situations. eGFR's persistently <60 mL/min signify possible Chronic Kidney Disease.    Anion gap 3 (L) 5 - 15    Comment: Performed at [facility], [address] [place],  [address]  Glucose, capillary     Status: None   Collection Time: 11/22/17 11:15 AM  Result Value Ref Range   Glucose-Capillary 80 70 - 99 mg/dL  Comprehensive metabolic panel     Status: Abnormal   Collection Time: 11/23/17  8:14 AM  Result Value Ref Range   Sodium 142 135 - 145 mmol/L   Potassium 3.7 3.5 - 5.1 mmol/L   Chloride 104 98 - 111 mmol/L   CO2 29 22 - 32 mmol/L   Glucose, Bld 95 70 - 99 mg/dL   BUN 16 8 - 23 mg/dL   Creatinine, Ser 1.20 0.61 - 1.24  mg/dL   Calcium 8.9 8.9 - 10.3 mg/dL   Total Protein 8.1 6.5 - 8.1 g/dL   Albumin 2.8 (L) 3.5 - 5.0 g/dL   AST 18 15 - 41 U/L   ALT 8 0 - 44 U/L   Alkaline Phosphatase 73 38 - 126 U/L   Total Bilirubin 0.3 0.3 - 1.2 mg/dL   GFR calc non Af Amer 56 (L) >60 mL/min   GFR calc Af Amer >60 >60 mL/min    Comment: (NOTE) The eGFR has been calculated using the CKD EPI equation. This calculation has not been validated in all clinical situations. eGFR's persistently <60 mL/min signify possible Chronic  Kidney Disease.    Anion gap 9 5 - 15    Comment: Performed at [facility], [address] [place], [place] [address]  CBC with Differential     Status: Abnormal   Collection Time: 11/23/17  8:14 AM  Result Value Ref Range   WBC 8.0 3.8 - 10.6 K/uL   RBC 3.28 (L) 4.40 - 5.90 MIL/uL   Hemoglobin 10.3 (L) 13.0 - 18.0 g/dL   HCT 31.3 (L) 40.0 - 52.0 %   MCV 95.5 80.0 - 100.0 fL   MCH 31.4 26.0 - 34.0 pg   MCHC 32.9 32.0 - 36.0 g/dL   RDW 16.5 (H) 11.5 - 14.5 %   Platelets 427 150 - 440 K/uL   Neutrophils Relative % 67 %   Neutro Abs 5.4 1.4 - 6.5 K/uL   Lymphocytes Relative 19 %   Lymphs Abs 1.5 1.0 - 3.6 K/uL   Monocytes Relative 11 %   Monocytes Absolute 0.9 0.2 - 1.0 K/uL   Eosinophils Relative 2 %   Eosinophils Absolute 0.1 0 - 0.7 K/uL   Basophils Relative 1 %   Basophils Absolute 0.1 0 - 0.1 K/uL    Comment: Performed at [facility], [address] [place], [place] [address]  Thyroid Panel With TSH     Status: None   Collection Time: 11/23/17  8:14 AM  Result Value Ref Range   TSH 2.960 0.450 - 4.500 uIU/mL   T4, Total 7.7 4.5 - 12.0 ug/dL   T3 Uptake Ratio 29 24 - 39 %   Free Thyroxine Index 2.2 1.2 - 4.9    Comment: (NOTE) Performed At: [facility] [place], [place] [id number] Rush Farmer MD MP:[id number]       PHQ2/9: Depression screen [facility] 2/9 11/24/2017 10/11/2017 08/16/2017 08/08/2017 04/24/2017  Decreased Interest 0 0 0 0 0  Down, Depressed, Hopeless 0 0 0 0 0  PHQ - 2 Score 0 0 0 0 0  Altered sleeping 3 0 - 0 -  Tired, decreased energy 3 0 - 0 -  Change in appetite 1 0 - 0 -  Feeling bad or failure about yourself  0 0 - 0 -  Trouble concentrating 0 0 - 0 -  Moving slowly or fidgety/restless 0 0 - 0 -  Suicidal thoughts 0 0 - 0 -  PHQ-9 Score 7 0 - 0 -  Difficult doing work/chores Not difficult at all Not difficult at all - Not difficult at all -     Fall Risk: Fall Risk  11/24/2017 10/11/2017 08/16/2017  08/08/2017 07/24/2017  Falls in the past year? No No No No No  Number falls in past yr: - - - - -  Injury with Fall? - - - - -  Risk for fall due to : - - -  Impaired vision;Other (Comment);Medication side effect;Impaired mobility -  Risk for fall due to: Comment - - - wears eyeglasses; use of O2, weakness, dyspnea -      Assessment & Plan  1. Chronic obstructive pulmonary disease, unspecified COPD type ([place])  Off medication, only on oxygen also on Trelegy  - Fluticasone-Umeclidin-Vilant (TRELEGY ELLIPTA) 100-62.5-25 MCG/INH AEPB; Inhale 1 puff into the lungs daily.  Dispense: 60 each; Refill: 5  2. Needs flu shot  - Flu vaccine HIGH DOSE PF  3. Chronic insomnia  He will try taking one hour before going to bed   4. Cancer, bladder, neck ([place])  Keep follow up with oncologist, he has a foley because of urinary retension  5. Dependence on supplemental oxygen  On oxygen   6. Hemiparesis affecting left side as late effect of cerebrovascular accident (CVA) ([place])  Refuses to go back on statin and aspirin   7. Rheumatoid arthritis of wrist, unspecified laterality, unspecified rheumatoid factor presence (HCC)  Keep follow up with Dr. Jefm Bryant   8. Aortic atherosclerosis ([place])   9. Gastroesophageal reflux disease, esophagitis presence not specified   10. Moderate protein-calorie malnutrition ([place])  Continue high calorie intake

## 2017-11-30 ENCOUNTER — Encounter: Payer: Self-pay | Admitting: Urology

## 2017-11-30 ENCOUNTER — Telehealth: Payer: Self-pay | Admitting: Family Medicine

## 2017-11-30 ENCOUNTER — Ambulatory Visit (INDEPENDENT_AMBULATORY_CARE_PROVIDER_SITE_OTHER): Payer: Medicare HMO | Admitting: Urology

## 2017-11-30 VITALS — BP 106/65 | HR 80 | Ht 68.0 in | Wt 115.6 lb

## 2017-11-30 DIAGNOSIS — Z8546 Personal history of malignant neoplasm of prostate: Secondary | ICD-10-CM

## 2017-11-30 DIAGNOSIS — C675 Malignant neoplasm of bladder neck: Secondary | ICD-10-CM

## 2017-11-30 DIAGNOSIS — R339 Retention of urine, unspecified: Secondary | ICD-10-CM | POA: Diagnosis not present

## 2017-11-30 NOTE — Telephone Encounter (Signed)
He needs to take medication one hour before bed time, try to go to bed earlier instead of close to midnight.  Please review sleep hygiene. What else has he tried for sleep? May switch to Remeron

## 2017-11-30 NOTE — Telephone Encounter (Signed)
Copied from [place] [id number]. Topic: Quick Communication - See Telephone Encounter >> Nov 30, 2017  8:50 AM Rutherford Nail, NT wrote: CRM for notification. See Telephone encounter for: 11/30/17. Patient calling and states that the traZODone (DESYREL) 100 MG tablet is no longer working for him. States that he mentioned it at his last appointment. Would like to know if there is anything else that could be sent to help him sleep at night? Please advise. [id number] - [place], [place] - [address]

## 2017-11-30 NOTE — Telephone Encounter (Signed)
Please advise 

## 2017-11-30 NOTE — Progress Notes (Signed)
11/30/2017 2:30 PM   [place] 08/12/1935 [id number]  Referring provider: [name], MD [address] [place], [place] [address]  No chief complaint on file.   HPI: Patient is a 82 year old Afro-American male with a high grade invasive urothelial carcinoma involving the prostate, a history of prostate cancer and recurrent urinary tract infections who presents today for a Foley exchange.  Background history 82 yo M followed by urology for recurrent UTIs and urinary retention as well as a personal history of prostate cancer status post brachytherapy in 2006 who recently underwent TURP due to suspicious prostatic findings on cystoscopy after difficult Foley insertion. Unfortunately, surgical pathology shows high grave invasive urothelial carcinoma involving the prostate with squamous differentiation, focal keratinization and extensive necrosis.  Perineural invasion was present. Postoperatively, he was able to void spontaneously until earlier this week when he could no longer urinate and required Foley catheter replacement. Normal LFTs.  Normal alk phos.  No recent cross-sectional abdominal or pelvic imaging.   Chest CT 3 months ago shows interval increased size of posterior right upper pole "band like" nodule measuring 1.4 x 2.3 which is felt to be most compatible with a benign process with multiple other nodules and scarring along with emphysematous changes. He has lost 25 lbs over the past 4 years without trying.  He does live alone and does most of his own ADLs.   He has multiple medical comorbidities.  He has a personal history of COPD on 02, lung cancer, and prostate cancer as well as history of stroke amongst others.  Contrast CT on 11/14/2017 revealed bladder is thick-walled although underdistended/decompressed by indwelling Foley catheter in this patient with known bladder cancer.  Brachytherapy seeds in the prostate.  No findings specific for metastatic  disease.  PET scan on 11/22/2017 revealed no evidence of local bladder recurrence or metastasis in the pelvis.  No evidence distant metastatic disease.  He currently undergoing treatment with Tecentriq (received 1st cycle on 11/23/2017).  He feels he is doing well and is "going to beat this thing."    He is having leakage around the catheter likely due to bladder spasms.  He is wearing depends daily.   Patient denies any gross hematuria, dysuria or suprapubic/flank pain.  Patient denies any fevers, chills, nausea or vomiting.    PMH: Past Medical History:  Diagnosis Date  . Arthritis   . Collagen vascular disease (HCC)    rheumatoid arthritis  . COPD (chronic obstructive pulmonary disease) ([place])   . GERD (gastroesophageal reflux disease)   . History of prostate cancer   . HLD (hyperlipidemia)   . HOH (hard of hearing)   . Insomnia   . Lung cancer ([place]) 2017   rad tx's.  . Oxygen deficit    2L HS AND PRN  . Prostate cancer ([place]) 2006   Rad seed tx's.  . Stroke [facility])    2000    Surgical History: Past Surgical History:  Procedure Laterality Date  . APPENDECTOMY  1965  . CATARACT EXTRACTION W/PHACO Right 01/05/2016   Procedure: CATARACT EXTRACTION PHACO AND INTRAOCULAR LENS PLACEMENT (IOC);  Surgeon: [name], MD;  Location: [facility] ORS;  Service: Ophthalmology;  Laterality: Right;  [place] 1.14 AP% 18.1 CDE 13.44 FLUID PACK LOT # [id number] H  . CYSTOSCOPY W/ RETROGRADES Bilateral 11/01/2017   Procedure: CYSTOSCOPY WITH RETROGRADE PYELOGRAM;  Surgeon: Hollice Espy, MD;  Location: [facility] ORS;  Service: Urology;  Laterality: Bilateral;  . ENDOBRONCHIAL ULTRASOUND N/A 02/26/2015   Procedure:  ENDOBRONCHIAL ULTRASOUND;  Surgeon: Flora Lipps, MD;  Location: [facility] ORS;  Service: Cardiopulmonary;  Laterality: N/A;  . [place]  . INSERTION PROSTATE RADIATION SEED  2002  . TRANSURETHRAL RESECTION OF PROSTATE N/A 11/01/2017   Procedure: TRANSURETHRAL RESECTION OF THE PROSTATE  (TURP) (channel TURP);  Surgeon: Hollice Espy, MD;  Location: [facility] ORS;  Service: Urology;  Laterality: N/A;    Home Medications:  Allergies as of 11/30/2017      Reactions   Plaquenil [hydroxychloroquine]    Bad dreams   Simvastatin Other (See Comments)   Bad dreams      Medication List        Accurate as of 11/30/17  2:30 PM. Always use your most recent med list.          Fluticasone-Umeclidin-Vilant 100-62.5-25 MCG/INH Aepb Inhale 1 puff into the lungs daily.   folic acid 1 MG tablet Commonly known as:  FOLVITE Take 1 tablet (1 mg total) by mouth daily.   methotrexate 2.5 MG tablet Commonly known as:  RHEUMATREX Take 5 tablets by mouth once a week.   OXYGEN Place 2 L into the nose daily as needed.   prochlorperazine 10 MG tablet Commonly known as:  COMPAZINE Take 1 tablet (10 mg total) by mouth every 6 (six) hours as needed (Nausea or vomiting).   tamsulosin 0.4 MG Caps capsule Commonly known as:  FLOMAX Take 1 capsule (0.4 mg total) by mouth daily.   traZODone 100 MG tablet Commonly known as:  DESYREL TAKE 1 TABLET BY MOUTH EVERYDAY AT BEDTIME       Allergies:  Allergies  Allergen Reactions  . Plaquenil [Hydroxychloroquine]     Bad dreams  . Simvastatin Other (See Comments)    Bad dreams    Family History: Family History  Problem Relation Age of Onset  . Cancer Mother   . Cancer Father        Lung Cancer  . Cancer Sister        breast  . Cancer Brother        stomach    Social History:  reports that he quit smoking about 4 years ago. His smoking use included cigarettes. He has a 25.00 pack-year smoking history. He has never used smokeless tobacco. He reports that he does not drink alcohol or use drugs.  ROS: UROLOGY Frequent Urination?: No Hard to postpone urination?: No Burning/pain with urination?: No Get up at night to urinate?: No Leakage of urine?: No Urine stream starts and stops?: No Trouble starting stream?: No Do you  have to strain to urinate?: Yes Blood in urine?: No Urinary tract infection?: No Sexually transmitted disease?: No Injury to kidneys or bladder?: No Painful intercourse?: No Weak stream?: No Erection problems?: Yes Penile pain?: Yes  Gastrointestinal Nausea?: No Vomiting?: No Indigestion/heartburn?: No Diarrhea?: No Constipation?: No  Constitutional Fever: No Night sweats?: No Weight loss?: No Fatigue?: No  Skin Skin rash/lesions?: No Itching?: No  Eyes Blurred vision?: No Double vision?: No  Ears/Nose/Throat Sore throat?: No Sinus problems?: No  Hematologic/Lymphatic Swollen glands?: No Easy bruising?: No  Cardiovascular Leg swelling?: No Chest pain?: No  Respiratory Cough?: Yes Shortness of breath?: Yes  Endocrine Excessive thirst?: No  Musculoskeletal Back pain?: No Joint pain?: No  Neurological Headaches?: No Dizziness?: No  Psychologic Depression?: No Anxiety?: No  Physical Exam: BP 106/65 (BP Location: Left Arm, Patient Position: Sitting, Cuff Size: Normal)   Pulse 80   Ht '5\' 8"'  (1.727 m)  Wt 115 lb 9.6 oz (52.4 kg)   BMI 17.58 kg/m   Constitutional:Cachectic.  Alert and oriented, No acute distress. HEENT: [place] AT, moist mucus membranes. Trachea midline, no masses. Cardiovascular: No clubbing, cyanosis, or edema. Respiratory: Normal respiratory effort, no increased work of breathing. GI: Abdomen is soft, non tender, non distended, no abdominal masses. Liver and spleen not palpable.  No hernias appreciated.  Stool sample for occult testing is not indicated.   GU: No CVA tenderness.  No bladder fullness or masses.  Patient with uncircumcised phallus.   Foreskin easily retracted.   Urethral meatus is patent.  No penile discharge. No penile lesions or rashes. Scrotum without lesions, cysts, rashes and/or edema. Testicles are located scrotally bilaterally. No masses are appreciated in the testicles. Left and right epididymis are  normal. Rectal: not examined Skin: No rashes, bruises or suspicious lesions. Lymph: No cervical or inguinal adenopathy. Neurologic: Grossly intact, no focal deficits, moving all 4 extremities. Psychiatric: Normal mood and affect.  Laboratory Data: Lab Results  Component Value Date   WBC 8.0 11/23/2017   HGB 10.3 (L) 11/23/2017   HCT 31.3 (L) 11/23/2017   MCV 95.5 11/23/2017   PLT 427 11/23/2017    Lab Results  Component Value Date   CREATININE 1.20 11/23/2017   Urinalysis N/a I have reviewed the labs.  Procedure Cath Change/ Replacement Patient is present today for a catheter change due to urinary retention.  10 ml of water was removed from the balloon, a 18 FR coude cath was removed with out difficulty.  Patient was cleaned and prepped in a sterile fashion with betadine and 2% lidocaine jelly was instilled into the urethra. A 16 FR Coude cath (18 F Coude was not in stock) was replaced into the bladder no complications were noted   Urine return was noted 40 ml and urine was yellow in color. The balloon was filled with 75m of sterile water. Foreskin down over glans.  A overnight bag was attached for drainage.  A night bag was also given to the patient and patient was given instruction on how to change from one bag to another. Patient was given proper instruction on catheter care.    Performed by: SZara Council PA-C  ROS UROLOGY Frequent Urination?: No Hard to postpone urination?: No Burning/pain with urination?: No Get up at night to urinate?: No Leakage of urine?: No Urine stream starts and stops?: No Trouble starting stream?: No Do you have to strain to urinate?: Yes Blood in urine?: No Urinary tract infection?: No Sexually transmitted disease?: No Injury to kidneys or bladder?: No Painful intercourse?: No Weak stream?: No Erection problems?: Yes Penile pain?: Yes Gastrointestinal Nausea?: No Vomiting?: No Indigestion/heartburn?: No Diarrhea?:  No Constipation?: No Constitutional Fever: No Night sweats?: No Weight loss?: No Fatigue?: No Skin Skin rash/lesions?: No Itching?: No Eyes Blurred vision?: No Double vision?: No Ears/Nose/Throat Sore throat?: No Sinus problems?: No Hematologic/Lymphatic Swollen glands?: No Easy bruising?: No Cardiovascular Leg swelling?: No Chest pain?: No Respiratory Cough?: Yes Shortness of breath?: Yes Endocrine Excessive thirst?: No Musculoskeletal Back pain?: No Joint pain?: No Neurological Headaches?: No Dizziness?: No Psychologic Depression?: No Anxiety?: No Assessment & Plan:    1. Malignant neoplasm of urinary bladder neck (HCC)  Newly diagnosed high-grade invasive urothelial carcinoma involving the prostate with squamous differentiation CT Chest scheduled for 07/2018 Received 1 cycle of Tecentriq on 11/23/2017 - scheduled for next cycle on 10/24 Will require periodic cystoscopies to assess for therapy response  2. Urinary  retention Maintain Foley catheter Foley exchanged today RTC in one month for next exchange  3. History of prostate cancer Recent PSA undetectable No evidence of disease   [name], PA-C  [facility] [address] [place], [place] [address] [phone]

## 2017-12-04 NOTE — Telephone Encounter (Signed)
Patient was given the instructions per Dr. [name] on how to improve his sleeping habits. Patient stated that he does not do anything else and will try her suggestions. He was encouraged to give our office a call if it does not improve then he we will changed his medication.

## 2017-12-10 NOTE — Progress Notes (Signed)
[place]  Telephone:(336) [phone] Fax:(336) [phone]  ID: Collin Gonzales OB: 1935/11/19  MR#: [id number]  SKA#:[id number]  Patient Care Team: [name], MD as PCP - General (Family Medicine) [name], MD as Referring Physician (Ophthalmology) [name], MD as Consulting Physician (Rheumatology) [name], MD as Referring Physician (Specialist) [name], MD as Consulting Physician (Urology) [name], MD as Referring Physician (Radiation Oncology)  CHIEF COMPLAINT: Locally advanced high-grade urothelial carcinoma.    INTERVAL HISTORY: Patient returns to clinic today for further evaluation and consideration of cycle 2 of Tecentriq.  He tolerated his first infusion well without significant side effects.  He continues to feel well and remains asymptomatic. He has no neurologic complaints.  He denies any recent fevers or illnesses.  He has chronic shortness of breath, but denies any chest pain, hemoptysis, or cough.  He has no nausea, vomiting, constipation, or diarrhea.  He has no urinary complaints today.  Patient offers no specific complaints today.  REVIEW OF SYSTEMS:   Review of Systems  Constitutional: Negative.  Negative for fever, malaise/fatigue and weight loss.  Respiratory: Negative.  Negative for cough, hemoptysis and shortness of breath.   Cardiovascular: Negative.  Negative for chest pain and leg swelling.  Gastrointestinal: Negative.  Negative for abdominal pain, blood in stool and melena.  Genitourinary: Negative.  Negative for dysuria and hematuria.  Musculoskeletal: Negative.  Negative for back pain.  Skin: Negative.  Negative for rash.  Neurological: Negative.  Negative for sensory change, focal weakness, weakness and headaches.  Psychiatric/Behavioral: Negative.  The patient is not nervous/anxious.     As per HPI. Otherwise, a complete review of systems is negative.  PAST MEDICAL  HISTORY: Past Medical History:  Diagnosis Date  . Arthritis   . Collagen vascular disease (HCC)    rheumatoid arthritis  . COPD (chronic obstructive pulmonary disease) ([place])   . GERD (gastroesophageal reflux disease)   . History of prostate cancer   . HLD (hyperlipidemia)   . HOH (hard of hearing)   . Insomnia   . Lung cancer ([place]) 2017   rad tx's.  . Oxygen deficit    2L HS AND PRN  . Prostate cancer ([place]) 2006   Rad seed tx's.  . Stroke ([place])    2000    PAST SURGICAL HISTORY: Past Surgical History:  Procedure Laterality Date  . APPENDECTOMY  1965  . CATARACT EXTRACTION W/PHACO Right 01/05/2016   Procedure: CATARACT EXTRACTION PHACO AND INTRAOCULAR LENS PLACEMENT (IOC);  Surgeon: [name], MD;  Location: [facility] ORS;  Service: Ophthalmology;  Laterality: Right;  [place] 1.14 AP% 18.1 CDE 13.44 FLUID PACK LOT # [id number] H  . CYSTOSCOPY W/ RETROGRADES Bilateral 11/01/2017   Procedure: CYSTOSCOPY WITH RETROGRADE PYELOGRAM;  Surgeon: Hollice Espy, MD;  Location: [facility] ORS;  Service: Urology;  Laterality: Bilateral;  . ENDOBRONCHIAL ULTRASOUND N/A 02/26/2015   Procedure: ENDOBRONCHIAL ULTRASOUND;  Surgeon: Flora Lipps, MD;  Location: [facility] ORS;  Service: Cardiopulmonary;  Laterality: N/A;  . [place]  . INSERTION PROSTATE RADIATION SEED  2002  . TRANSURETHRAL RESECTION OF PROSTATE N/A 11/01/2017   Procedure: TRANSURETHRAL RESECTION OF THE PROSTATE (TURP) (channel TURP);  Surgeon: Hollice Espy, MD;  Location: [facility] ORS;  Service: Urology;  Laterality: N/A;    FAMILY HISTORY: Family History  Problem Relation Age of Onset  . Cancer Mother   . Cancer Father        Lung Cancer  . Cancer Sister  breast  . Cancer Brother        stomach    ADVANCED DIRECTIVES (Y/N):  N  HEALTH MAINTENANCE: Social History   Tobacco Use  . Smoking status: Former Smoker    Packs/day: 0.50    Years: 50.00    Pack years: 25.00    Types: Cigarettes    Last attempt to quit:  02/21/2013    Years since quitting: 4.8  . Smokeless tobacco: Never Used  . Tobacco comment: smoking cessation materials not required  Substance Use Topics  . Alcohol use: No    Alcohol/week: 0.0 standard drinks  . Drug use: No     Colonoscopy:  PAP:  Bone density:  Lipid panel:  Allergies  Allergen Reactions  . Plaquenil [Hydroxychloroquine]     Bad dreams  . Simvastatin Other (See Comments)    Bad dreams    Current Outpatient Medications  Medication Sig Dispense Refill  . Fluticasone-Umeclidin-Vilant (TRELEGY ELLIPTA) 100-62.5-25 MCG/INH AEPB Inhale 1 puff into the lungs daily. 60 each 5  . folic acid (FOLVITE) 1 MG tablet Take 1 tablet (1 mg total) by mouth daily. 30 tablet 5  . methotrexate (RHEUMATREX) 2.5 MG tablet Take 5 tablets by mouth once a week.    . OXYGEN Place 2 L into the nose daily as needed.     . prochlorperazine (COMPAZINE) 10 MG tablet Take 1 tablet (10 mg total) by mouth every 6 (six) hours as needed (Nausea or vomiting). 60 tablet 2  . tamsulosin (FLOMAX) 0.4 MG CAPS capsule Take 1 capsule (0.4 mg total) by mouth daily. (Patient taking differently: Take 0.8 mg by mouth daily. ) 30 capsule 11  . traZODone (DESYREL) 100 MG tablet TAKE 1 TABLET BY MOUTH EVERYDAY AT BEDTIME 90 tablet 1   No current facility-administered medications for this visit.     OBJECTIVE: Vitals:   12/14/17 0940 12/14/17 0944  BP:  115/65  Pulse:  64  Resp: 16   Temp:  98 F (36.7 C)     Body mass index is 17.93 kg/m.    ECOG FS:0 - Asymptomatic  G general: Well-developed, well-nourished, no acute distress. Eyes: Pink conjunctiva, anicteric sclera. HEENT: Normocephalic, moist mucous membranes. Lungs: Clear to auscultation bilaterally. Heart: Regular rate and rhythm. No rubs, murmurs, or gallops. Abdomen: Soft, nontender, nondistended. No organomegaly noted, normoactive bowel sounds. Musculoskeletal: No edema, cyanosis, or clubbing. Neuro: Alert, answering all questions  appropriately. Cranial nerves grossly intact. Skin: No rashes or petechiae noted. Psych: Normal affect.  LAB RESULTS:  Lab Results  Component Value Date   NA 137 12/14/2017   K 3.7 12/14/2017   CL 103 12/14/2017   CO2 29 12/14/2017   GLUCOSE 99 12/14/2017   BUN 19 12/14/2017   CREATININE 1.08 12/14/2017   CALCIUM 9.0 12/14/2017   PROT 8.4 (H) 12/14/2017   ALBUMIN 2.7 (L) 12/14/2017   AST 18 12/14/2017   ALT 9 12/14/2017   ALKPHOS 63 12/14/2017   BILITOT 0.4 12/14/2017   GFRNONAA >60 12/14/2017   GFRAA >60 12/14/2017    Lab Results  Component Value Date   WBC 5.2 12/14/2017   NEUTROABS 3.9 12/14/2017   HGB 9.3 (L) 12/14/2017   HCT 30.1 (L) 12/14/2017   MCV 96.5 12/14/2017   PLT 161 12/14/2017     STUDIES: Nm Pet Image Initial (pi) Skull Base To Thigh  Result Date: 11/22/2017 CLINICAL DATA:  Subsequent treatment strategy for urothelial carcinoma of the bladder. Lung cancer diagnosed 2017. RIGHT upper lobe  SP RT. EXAM: NUCLEAR MEDICINE PET SKULL BASE TO THIGH TECHNIQUE: 6.5 mCi F-18 FDG was injected intravenously. Full-ring PET imaging was performed from the skull base to thigh after the radiotracer. CT data was obtained and used for attenuation correction and anatomic localization. Fasting blood glucose: 80 mg/dl COMPARISON:  PET-CT 06/23/2015 FINDINGS: Mediastinal blood pool activity: SUV max 1.7 NECK: No hypermetabolic lymph nodes in the neck. Incidental CT findings: none CHEST: There is reticular scarring thickening in the RIGHT upper lobe at the previous focal hypermetabolic nodule. There is mild metabolic activity with SUV max equal 2.3 decreased from 9.3 following therapy. No discrete nodularity. Phase favored post radiation change. There is severe centrilobular emphysema the upper lobes. There is bullous change in the RIGHT upper lobe. No new pulmonary nodules. Incidental CT findings: None ABDOMEN/PELVIS: No abnormal metabolic activity liver. No hypermetabolic  abdominopelvic lymph nodes. There is no evidence of local bladder cancer recurrence. Intense radiotracer activity within the bladder does limit evaluation of the bladder near bladder tissues. Foley catheter in place. No abnormal metabolic activity the prostate bed. No hypermetabolic pelvic lymph nodes. Incidental CT findings: none SKELETON: No focal hypermetabolic activity to suggest skeletal metastasis. Incidental CT findings: none IMPRESSION: 1. No evidence of local bladder recurrence or metastasis in the pelvis. 2. No evidence distant metastatic disease. 3. The bladder itself is not well evaluated by FDG PET imaging. 4. Reticular scarring in the RIGHT upper lobe at SB RT site with mild metabolic activity is favored benign post radiation change. 5. Aortic Atherosclerosis (ICD10-I70.0) and Emphysema (ICD10-J43.9). Electronically Signed   By: Suzy Bouchard M.D.   On: 11/22/2017 15:37    ASSESSMENT: Locally advanced high-grade urothelial carcinoma.  PLAN:    1. Locally advanced high-grade urothelial carcinoma: Case discussed with urology, radiation oncology, as well as multidisciplinary tumor board.  Patient is not a surgical candidate nor can he received additional XRT given his history of prostate cancer and brachii therapy seed placement.  Though he has a decent performance status, patient is frail and likely would not tolerate cisplatin based therapy.  PET scan results from November 22, 2017 reviewed independently and report as above with no obvious metastatic disease.  Because of this, patient will likely need periodic cystoscopies to evaluate for therapy response.  Proceed with cycle 2 of Tecentriq today.  Return to clinic in 3 weeks for further evaluation and consideration of cycle 3.   2.  Right upper lobe lung mass: PET scan results as above.  Previously although biopsy was negative, patient had a positive PET scan that was highly suspicious for underlying malignancy. He underwent SBRT in  approximately May 2017.   3. Shortness of breath: Patient does not complain of this today.  Chronic and unchanged. 4.  Anemia: Patient's hemoglobin has trended down slightly to 9.3.  Monitor.    Patient expressed understanding and was in agreement with this plan. He also understands that He can call clinic at any time with any questions, concerns, or complaints.    Lloyd Huger, MD   12/18/2017 2:34 PM

## 2017-12-14 ENCOUNTER — Inpatient Hospital Stay: Payer: Medicare HMO

## 2017-12-14 ENCOUNTER — Encounter: Payer: Self-pay | Admitting: Oncology

## 2017-12-14 ENCOUNTER — Inpatient Hospital Stay (HOSPITAL_BASED_OUTPATIENT_CLINIC_OR_DEPARTMENT_OTHER): Payer: Medicare HMO | Admitting: Oncology

## 2017-12-14 ENCOUNTER — Other Ambulatory Visit: Payer: Self-pay

## 2017-12-14 VITALS — BP 115/65 | HR 64 | Temp 98.0°F | Resp 16 | Ht 68.0 in | Wt 117.9 lb

## 2017-12-14 DIAGNOSIS — J449 Chronic obstructive pulmonary disease, unspecified: Secondary | ICD-10-CM | POA: Diagnosis not present

## 2017-12-14 DIAGNOSIS — M069 Rheumatoid arthritis, unspecified: Secondary | ICD-10-CM

## 2017-12-14 DIAGNOSIS — Z8673 Personal history of transient ischemic attack (TIA), and cerebral infarction without residual deficits: Secondary | ICD-10-CM

## 2017-12-14 DIAGNOSIS — D649 Anemia, unspecified: Secondary | ICD-10-CM | POA: Diagnosis not present

## 2017-12-14 DIAGNOSIS — Z923 Personal history of irradiation: Secondary | ICD-10-CM

## 2017-12-14 DIAGNOSIS — Z803 Family history of malignant neoplasm of breast: Secondary | ICD-10-CM

## 2017-12-14 DIAGNOSIS — K219 Gastro-esophageal reflux disease without esophagitis: Secondary | ICD-10-CM

## 2017-12-14 DIAGNOSIS — E785 Hyperlipidemia, unspecified: Secondary | ICD-10-CM

## 2017-12-14 DIAGNOSIS — C679 Malignant neoplasm of bladder, unspecified: Secondary | ICD-10-CM

## 2017-12-14 DIAGNOSIS — R918 Other nonspecific abnormal finding of lung field: Secondary | ICD-10-CM | POA: Diagnosis not present

## 2017-12-14 DIAGNOSIS — Z8546 Personal history of malignant neoplasm of prostate: Secondary | ICD-10-CM

## 2017-12-14 DIAGNOSIS — Z79899 Other long term (current) drug therapy: Secondary | ICD-10-CM

## 2017-12-14 DIAGNOSIS — E1122 Type 2 diabetes mellitus with diabetic chronic kidney disease: Secondary | ICD-10-CM

## 2017-12-14 DIAGNOSIS — Z87891 Personal history of nicotine dependence: Secondary | ICD-10-CM

## 2017-12-14 DIAGNOSIS — Z5112 Encounter for antineoplastic immunotherapy: Secondary | ICD-10-CM | POA: Diagnosis not present

## 2017-12-14 DIAGNOSIS — I129 Hypertensive chronic kidney disease with stage 1 through stage 4 chronic kidney disease, or unspecified chronic kidney disease: Secondary | ICD-10-CM

## 2017-12-14 DIAGNOSIS — Z800 Family history of malignant neoplasm of digestive organs: Secondary | ICD-10-CM

## 2017-12-14 DIAGNOSIS — N189 Chronic kidney disease, unspecified: Secondary | ICD-10-CM

## 2017-12-14 DIAGNOSIS — Z801 Family history of malignant neoplasm of trachea, bronchus and lung: Secondary | ICD-10-CM

## 2017-12-14 LAB — CBC WITH DIFFERENTIAL/PLATELET
ABS IMMATURE GRANULOCYTES: 0.02 10*3/uL (ref 0.00–0.07)
BASOS ABS: 0 10*3/uL (ref 0.0–0.1)
BASOS PCT: 0 %
Eosinophils Absolute: 0.2 10*3/uL (ref 0.0–0.5)
Eosinophils Relative: 4 %
HCT: 30.1 % — ABNORMAL LOW (ref 39.0–52.0)
Hemoglobin: 9.3 g/dL — ABNORMAL LOW (ref 13.0–17.0)
Immature Granulocytes: 0 %
Lymphocytes Relative: 15 %
Lymphs Abs: 0.8 10*3/uL (ref 0.7–4.0)
MCH: 29.8 pg (ref 26.0–34.0)
MCHC: 30.9 g/dL (ref 30.0–36.0)
MCV: 96.5 fL (ref 80.0–100.0)
Monocytes Absolute: 0.3 10*3/uL (ref 0.1–1.0)
Monocytes Relative: 6 %
NEUTROS ABS: 3.9 10*3/uL (ref 1.7–7.7)
NRBC: 0 % (ref 0.0–0.2)
Neutrophils Relative %: 75 %
PLATELETS: 161 10*3/uL (ref 150–400)
RBC: 3.12 MIL/uL — ABNORMAL LOW (ref 4.22–5.81)
RDW: 16.4 % — ABNORMAL HIGH (ref 11.5–15.5)
WBC: 5.2 10*3/uL (ref 4.0–10.5)

## 2017-12-14 LAB — COMPREHENSIVE METABOLIC PANEL
ALBUMIN: 2.7 g/dL — AB (ref 3.5–5.0)
ALT: 9 U/L (ref 0–44)
ANION GAP: 5 (ref 5–15)
AST: 18 U/L (ref 15–41)
Alkaline Phosphatase: 63 U/L (ref 38–126)
BILIRUBIN TOTAL: 0.4 mg/dL (ref 0.3–1.2)
BUN: 19 mg/dL (ref 8–23)
CHLORIDE: 103 mmol/L (ref 98–111)
CO2: 29 mmol/L (ref 22–32)
Calcium: 9 mg/dL (ref 8.9–10.3)
Creatinine, Ser: 1.08 mg/dL (ref 0.61–1.24)
GFR calc Af Amer: >60 mL/min (ref >60)
Glucose, Bld: 99 mg/dL (ref 70–99)
POTASSIUM: 3.7 mmol/L (ref 3.5–5.1)
Sodium: 137 mmol/L (ref 135–145)
TOTAL PROTEIN: 8.4 g/dL — AB (ref 6.5–8.1)

## 2017-12-14 MED ORDER — SODIUM CHLORIDE 0.9 % IV SOLN
1200.0000 mg | Freq: Once | INTRAVENOUS | Status: AC
  Administered 2017-12-14: 1200 mg via INTRAVENOUS
  Filled 2017-12-14: qty 20

## 2017-12-14 MED ORDER — SODIUM CHLORIDE 0.9 % IV SOLN
Freq: Once | INTRAVENOUS | Status: AC
  Administered 2017-12-14: 11:00:00 via INTRAVENOUS
  Filled 2017-12-14: qty 250

## 2017-12-15 LAB — THYROID PANEL WITH TSH
Free Thyroxine Index: 2.5 (ref 1.2–4.9)
T3 Uptake Ratio: 30 % (ref 24–39)
T4 TOTAL: 8.3 ug/dL (ref 4.5–12.0)
TSH: 1.65 u[IU]/mL (ref 0.450–4.500)

## 2017-12-31 NOTE — Progress Notes (Signed)
[place]  [phone] [phone]  ID: Collin Gonzales OB: [birth year]  MR#: [id number]  WEX#:[id number]  Patient Care Team: [name], MD as PCP - General (Family Medicine) [name], MD as Referring Physician (Ophthalmology) [name], MD as Consulting Physician (Rheumatology) [name], MD as Referring Physician (Specialist) [name], MD as Consulting Physician (Urology) [name], MD as Referring Physician (Radiation Oncology)  CHIEF COMPLAINT: Locally advanced high-grade urothelial carcinoma.    INTERVAL HISTORY: Patient returns to clinic today for further evaluation and consideration of cycle 3 of Tecentriq.  He continues to tolerate his treatments well without significant side effects.  He has noticed some hematuria recently, but otherwise feels well. He has no neurologic complaints.  He denies any recent fevers or illnesses.  He has chronic shortness of breath, but denies any chest pain, hemoptysis, or cough.  He has no nausea, vomiting, constipation, or diarrhea.  He has no other urologic complaints.  Patient offers no further specific complaints today.  REVIEW OF SYSTEMS:   Review of Systems  Constitutional: Negative.  Negative for fever, malaise/fatigue and weight loss.  Respiratory: Negative.  Negative for cough, hemoptysis and shortness of breath.   Cardiovascular: Negative.  Negative for chest pain and leg swelling.  Gastrointestinal: Negative.  Negative for abdominal pain, blood in stool and melena.  Genitourinary: Positive for hematuria. Negative for dysuria.  Musculoskeletal: Negative.  Negative for back pain.  Skin: Negative.  Negative for rash.  Neurological: Negative.  Negative for sensory change, focal weakness, weakness and headaches.  Psychiatric/Behavioral: Negative.  The patient is not nervous/anxious.     As per HPI. Otherwise, a complete review of systems is  negative.  PAST MEDICAL HISTORY: Past Medical History:  Diagnosis Date  . Arthritis   . Collagen vascular disease (HCC)    rheumatoid arthritis  . COPD (chronic obstructive pulmonary disease) ([place])   . GERD (gastroesophageal reflux disease)   . History of prostate cancer   . HLD (hyperlipidemia)   . HOH (hard of hearing)   . Insomnia   . Lung cancer ([place]) 2017   rad tx's.  . Oxygen deficit    2L HS AND PRN  . Prostate cancer ([employer]) 2006   Rad seed tx's.  . Stroke ([place])    2000    PAST SURGICAL HISTORY: Past Surgical History:  Procedure Laterality Date  . APPENDECTOMY  1965  . CATARACT EXTRACTION W/PHACO Right 01/05/2016   Procedure: CATARACT EXTRACTION PHACO AND INTRAOCULAR LENS PLACEMENT (IOC);  Surgeon: [name], MD;  Location: [facility] ORS;  Service: Ophthalmology;  Laterality: Right;  [place] 1.14 AP% 18.1 CDE 13.44 FLUID PACK LOT # [id number] H  . CYSTOSCOPY W/ RETROGRADES Bilateral 11/01/2017   Procedure: CYSTOSCOPY WITH RETROGRADE PYELOGRAM;  Surgeon: Hollice Espy, MD;  Location: [facility] ORS;  Service: Urology;  Laterality: Bilateral;  . ENDOBRONCHIAL ULTRASOUND N/A 02/26/2015   Procedure: ENDOBRONCHIAL ULTRASOUND;  Surgeon: Flora Lipps, MD;  Location: [facility] ORS;  Service: Cardiopulmonary;  Laterality: N/A;  . [place]  . INSERTION PROSTATE RADIATION SEED  2002  . TRANSURETHRAL RESECTION OF PROSTATE N/A 11/01/2017   Procedure: TRANSURETHRAL RESECTION OF THE PROSTATE (TURP) (channel TURP);  Surgeon: Hollice Espy, MD;  Location: [facility] ORS;  Service: Urology;  Laterality: N/A;    FAMILY HISTORY: Family History  Problem Relation Age of Onset  . Cancer Mother   . Cancer Father        Lung Cancer  . Cancer Sister  breast  . Cancer Brother        stomach    ADVANCED DIRECTIVES (Y/N):  N  HEALTH MAINTENANCE: Social History   Tobacco Use  . Smoking status: Former Smoker    Packs/day: 0.50    Years: 50.00    Pack years: 25.00    Types: Cigarettes      Last attempt to quit: 02/21/2013    Years since quitting: 4.8  . Smokeless tobacco: Never Used  . Tobacco comment: smoking cessation materials not required  Substance Use Topics  . Alcohol use: No    Alcohol/week: 0.0 standard drinks  . Drug use: No     Colonoscopy:  PAP:  Bone density:  Lipid panel:  Allergies  Allergen Reactions  . Plaquenil [Hydroxychloroquine]     Bad dreams  . Simvastatin Other (See Comments)    Bad dreams    Current Outpatient Medications  Medication Sig Dispense Refill  . finasteride (PROSCAR) 5 MG tablet Take 5 mg by mouth daily.  11  . Fluticasone-Umeclidin-Vilant (TRELEGY ELLIPTA) 100-62.5-25 MCG/INH AEPB Inhale 1 puff into the lungs daily. 60 each 5  . folic acid (FOLVITE) 1 MG tablet Take 1 tablet (1 mg total) by mouth daily. 30 tablet 5  . methotrexate (RHEUMATREX) 2.5 MG tablet Take 5 tablets by mouth once a week.    . mirtazapine (REMERON) 30 MG tablet Take 1 tablet (30 mg total) by mouth at bedtime. 30 tablet 0  . OXYGEN Place 2 L into the nose daily as needed.     . prochlorperazine (COMPAZINE) 10 MG tablet Take 1 tablet (10 mg total) by mouth every 6 (six) hours as needed (Nausea or vomiting). 60 tablet 2  . tamsulosin (FLOMAX) 0.4 MG CAPS capsule Take 1 capsule (0.4 mg total) by mouth daily. (Patient taking differently: Take 0.8 mg by mouth daily. ) 30 capsule 11  . zolpidem (AMBIEN) 5 MG tablet Take 1 tablet (5 mg total) by mouth at bedtime as needed for sleep. 30 tablet 0   No current facility-administered medications for this visit.     OBJECTIVE: Vitals:   01/04/18 0836  BP: 109/63  Pulse: 84  Resp: 16  Temp: 98.3 F (36.8 C)     Body mass index is 17.18 kg/m.    ECOG FS:0 - Asymptomatic  General: Well-developed, well-nourished, no acute distress. Eyes: Pink conjunctiva, anicteric sclera. HEENT: Normocephalic, moist mucous membranes. Lungs: Clear to auscultation bilaterally. Heart: Regular rate and rhythm. No rubs,  murmurs, or gallops. Abdomen: Soft, nontender, nondistended. No organomegaly noted, normoactive bowel sounds. Musculoskeletal: No edema, cyanosis, or clubbing. Neuro: Alert, answering all questions appropriately. Cranial nerves grossly intact. Skin: No rashes or petechiae noted. Psych: Normal affect.  LAB RESULTS:  Lab Results  Component Value Date   NA 139 01/04/2018   K 4.1 01/04/2018   CL 104 01/04/2018   CO2 28 01/04/2018   GLUCOSE 104 (H) 01/04/2018   BUN 21 01/04/2018   CREATININE 1.09 01/04/2018   CALCIUM 9.3 01/04/2018   PROT 8.8 (H) 01/04/2018   ALBUMIN 2.8 (L) 01/04/2018   AST 18 01/04/2018   ALT 10 01/04/2018   ALKPHOS 65 01/04/2018   BILITOT 0.2 (L) 01/04/2018   GFRNONAA >60 01/04/2018   GFRAA >60 01/04/2018    Lab Results  Component Value Date   WBC 6.1 01/04/2018   NEUTROABS 5.0 01/04/2018   HGB 10.0 (L) 01/04/2018   HCT 33.0 (L) 01/04/2018   MCV 95.1 01/04/2018   PLT 265 01/04/2018  STUDIES: No results found.  ASSESSMENT: Locally advanced high-grade urothelial carcinoma.  PLAN:    1. Locally advanced high-grade urothelial carcinoma: Case discussed with urology, radiation oncology, as well as multidisciplinary tumor board.  Patient is not a surgical candidate nor can he receive additional XRT given his history of prostate cancer and brachii therapy seed placement.  Though he has a decent performance status, patient is frail and likely would not tolerate cisplatin based therapy.  PET scan results from November 22, 2017 reviewed independently with no obvious metastatic disease.  Because of this, patient will likely need periodic cystoscopies to evaluate for therapy response.  Proceed with cycle 3 of Tecentriq today.  Return to clinic in 3 weeks for lab and Tecentriq only and then in 6 weeks for further evaluation and consideration of cycle 5.   2.  Right upper lobe lung mass: PET scan results as above.  Previously although biopsy was negative, patient had  a positive PET scan that was highly suspicious for underlying malignancy. He underwent SBRT in approximately May 2017.   3. Shortness of breath: Patient does not complain of this today.  Chronic and unchanged. 4.  Anemia: Despite patient's complaint of hematuria, hemoglobin is trended up slightly to 10.0. 5.  Hematuria: Patient reports he has an appointment with urology later today.  Patient expressed understanding and was in agreement with this plan. He also understands that He can call clinic at any time with any questions, concerns, or complaints.    Lloyd Huger, MD   01/05/2018 10:14 AM

## 2018-01-01 ENCOUNTER — Ambulatory Visit: Payer: Self-pay

## 2018-01-01 NOTE — Telephone Encounter (Signed)
Pt. Reports he has been having difficulty falling and staying asleep. States he talked to Dr. Ancil Boozer about this in the past, but she did not want to increase his trazodone.Has arthritic/joint pain that bothers him at night as well. Wants to know if there is anything he can take. Please advise pt. Answer Assessment - Initial Assessment Questions 1. DESCRIPTION: "Tell me about your sleeping problem."       Difficulty asleep and staying asleep 2. ONSET: "How long have you been having trouble sleeping?" (e.g., days, weeks, months)      A few months 3. RECURRENT: "Have you had sleeping problems before?"  If yes: "What happened that time?" "What helped your sleeping problem go away in the past?"      No 4. STRESS: "Is there anything in your life that is making you feel stressed or tense?"     No 5. PAIN: "Do you have any pain that is keeping you awake?" (e.g., back pain, headache, abdominal pain)     Joint pain 6. CAFFEINE ABUSE: "Do you drink caffeinated beverages, and how much each day?" (e.g., coffee, tea, colas)      Drinks tea in the morning - water the rest of the day 7. SUBSTANCE ABUSE: "Do you use any illegal drugs or alcohol?"     No 8. OTHER SYMPTOMS: "Do you have any other symptoms?"  (e.g., difficulty breathing)      No  Protocols used: INSOMNIA-A-AH

## 2018-01-02 MED ORDER — MIRTAZAPINE 30 MG PO TABS: 30.0000 mg | ORAL_TABLET | Freq: Every day | ORAL | 0 refills | Status: DC

## 2018-01-02 NOTE — Telephone Encounter (Signed)
We can switch to remeron, but again he needs to implement some sleep hygiene

## 2018-01-02 NOTE — Addendum Note (Signed)
Addended by: Loistine Chance on: 01/02/2018 06:53 PM   Modules accepted: Orders

## 2018-01-04 ENCOUNTER — Other Ambulatory Visit: Payer: Self-pay

## 2018-01-04 ENCOUNTER — Other Ambulatory Visit: Payer: Self-pay | Admitting: *Deleted

## 2018-01-04 ENCOUNTER — Encounter: Payer: Self-pay | Admitting: Oncology

## 2018-01-04 ENCOUNTER — Inpatient Hospital Stay: Payer: Medicare HMO

## 2018-01-04 ENCOUNTER — Inpatient Hospital Stay: Payer: Medicare HMO | Attending: Oncology

## 2018-01-04 ENCOUNTER — Ambulatory Visit (INDEPENDENT_AMBULATORY_CARE_PROVIDER_SITE_OTHER): Payer: Medicare HMO | Admitting: Urology

## 2018-01-04 ENCOUNTER — Encounter: Payer: Self-pay | Admitting: Urology

## 2018-01-04 ENCOUNTER — Inpatient Hospital Stay (HOSPITAL_BASED_OUTPATIENT_CLINIC_OR_DEPARTMENT_OTHER): Payer: Medicare HMO | Admitting: Oncology

## 2018-01-04 VITALS — BP 120/69 | HR 91 | Ht 68.0 in | Wt 116.0 lb

## 2018-01-04 VITALS — BP 109/63 | HR 84 | Temp 98.3°F | Resp 16 | Ht 68.0 in | Wt 113.0 lb

## 2018-01-04 DIAGNOSIS — K219 Gastro-esophageal reflux disease without esophagitis: Secondary | ICD-10-CM

## 2018-01-04 DIAGNOSIS — Z79899 Other long term (current) drug therapy: Secondary | ICD-10-CM | POA: Insufficient documentation

## 2018-01-04 DIAGNOSIS — Z87891 Personal history of nicotine dependence: Secondary | ICD-10-CM | POA: Insufficient documentation

## 2018-01-04 DIAGNOSIS — Z923 Personal history of irradiation: Secondary | ICD-10-CM | POA: Diagnosis not present

## 2018-01-04 DIAGNOSIS — M069 Rheumatoid arthritis, unspecified: Secondary | ICD-10-CM | POA: Insufficient documentation

## 2018-01-04 DIAGNOSIS — D649 Anemia, unspecified: Secondary | ICD-10-CM

## 2018-01-04 DIAGNOSIS — Z85118 Personal history of other malignant neoplasm of bronchus and lung: Secondary | ICD-10-CM

## 2018-01-04 DIAGNOSIS — Z9981 Dependence on supplemental oxygen: Secondary | ICD-10-CM

## 2018-01-04 DIAGNOSIS — R339 Retention of urine, unspecified: Secondary | ICD-10-CM | POA: Diagnosis not present

## 2018-01-04 DIAGNOSIS — R319 Hematuria, unspecified: Secondary | ICD-10-CM

## 2018-01-04 DIAGNOSIS — Z8546 Personal history of malignant neoplasm of prostate: Secondary | ICD-10-CM | POA: Insufficient documentation

## 2018-01-04 DIAGNOSIS — Z5112 Encounter for antineoplastic immunotherapy: Secondary | ICD-10-CM | POA: Diagnosis present

## 2018-01-04 DIAGNOSIS — C675 Malignant neoplasm of bladder neck: Secondary | ICD-10-CM

## 2018-01-04 DIAGNOSIS — J449 Chronic obstructive pulmonary disease, unspecified: Secondary | ICD-10-CM | POA: Insufficient documentation

## 2018-01-04 DIAGNOSIS — Z8673 Personal history of transient ischemic attack (TIA), and cerebral infarction without residual deficits: Secondary | ICD-10-CM | POA: Diagnosis not present

## 2018-01-04 DIAGNOSIS — R918 Other nonspecific abnormal finding of lung field: Secondary | ICD-10-CM

## 2018-01-04 DIAGNOSIS — C679 Malignant neoplasm of bladder, unspecified: Secondary | ICD-10-CM

## 2018-01-04 DIAGNOSIS — E785 Hyperlipidemia, unspecified: Secondary | ICD-10-CM | POA: Diagnosis not present

## 2018-01-04 DIAGNOSIS — M1990 Unspecified osteoarthritis, unspecified site: Secondary | ICD-10-CM | POA: Insufficient documentation

## 2018-01-04 LAB — CBC WITH DIFFERENTIAL/PLATELET
ABS IMMATURE GRANULOCYTES: 0.03 10*3/uL (ref 0.00–0.07)
BASOS ABS: 0 10*3/uL (ref 0.0–0.1)
Basophils Relative: 1 %
Eosinophils Absolute: 0.1 10*3/uL (ref 0.0–0.5)
Eosinophils Relative: 1 %
HCT: 33 % — ABNORMAL LOW (ref 39.0–52.0)
HEMOGLOBIN: 10 g/dL — AB (ref 13.0–17.0)
Immature Granulocytes: 1 %
LYMPHS PCT: 13 %
Lymphs Abs: 0.8 10*3/uL (ref 0.7–4.0)
MCH: 28.8 pg (ref 26.0–34.0)
MCHC: 30.3 g/dL (ref 30.0–36.0)
MCV: 95.1 fL (ref 80.0–100.0)
Monocytes Absolute: 0.2 10*3/uL (ref 0.1–1.0)
Monocytes Relative: 4 %
NEUTROS ABS: 5 10*3/uL (ref 1.7–7.7)
NEUTROS PCT: 80 %
NRBC: 0 % (ref 0.0–0.2)
PLATELETS: 265 10*3/uL (ref 150–400)
RBC: 3.47 MIL/uL — AB (ref 4.22–5.81)
RDW: 17.1 % — ABNORMAL HIGH (ref 11.5–15.5)
WBC: 6.1 10*3/uL (ref 4.0–10.5)

## 2018-01-04 LAB — COMPREHENSIVE METABOLIC PANEL
ALBUMIN: 2.8 g/dL — AB (ref 3.5–5.0)
ALT: 10 U/L (ref 0–44)
AST: 18 U/L (ref 15–41)
Alkaline Phosphatase: 65 U/L (ref 38–126)
Anion gap: 7 (ref 5–15)
BILIRUBIN TOTAL: 0.2 mg/dL — AB (ref 0.3–1.2)
BUN: 21 mg/dL (ref 8–23)
CHLORIDE: 104 mmol/L (ref 98–111)
CO2: 28 mmol/L (ref 22–32)
Calcium: 9.3 mg/dL (ref 8.9–10.3)
Creatinine, Ser: 1.09 mg/dL (ref 0.61–1.24)
Glucose, Bld: 104 mg/dL — ABNORMAL HIGH (ref 70–99)
Potassium: 4.1 mmol/L (ref 3.5–5.1)
Sodium: 139 mmol/L (ref 135–145)
Total Protein: 8.8 g/dL — ABNORMAL HIGH (ref 6.5–8.1)

## 2018-01-04 MED ORDER — ZOLPIDEM TARTRATE 5 MG PO TABS: 5.0000 mg | ORAL_TABLET | Freq: Every evening | ORAL | 0 refills | Status: DC | PRN

## 2018-01-04 MED ORDER — SODIUM CHLORIDE 0.9 % IV SOLN
1200.0000 mg | Freq: Once | INTRAVENOUS | Status: AC
  Administered 2018-01-04: 1200 mg via INTRAVENOUS
  Filled 2018-01-04: qty 20

## 2018-01-04 MED ORDER — SODIUM CHLORIDE 0.9 % IV SOLN
Freq: Once | INTRAVENOUS | Status: AC
  Administered 2018-01-04: 10:00:00 via INTRAVENOUS
  Filled 2018-01-04: qty 250

## 2018-01-04 NOTE — Progress Notes (Signed)
Patient here for pretreatment check. No complaints today.

## 2018-01-04 NOTE — Progress Notes (Signed)
01/04/2018 3:12 PM   Collin Gonzales 12/09/1935 [id number]  Referring provider: [name], MD [address] [place], [place] [address]  Chief Complaint  Patient presents with  . Follow-up    foley cath exchange     HPI: Patient is a 82 year old Afro-American male with a high grade invasive urothelial carcinoma involving the prostate, a history of prostate cancer and recurrent urinary tract infections who presents today for a Foley exchange.   He returns today for the evaluation and management of urinary retention. Pt reports getting his third cycle of Tecentriq today for his high grade urothelial carcinoma. Pt is to return in one month for foley catheter exchanged. He denies any nausea, or blood in the urine and is to doing his best to stay well hydrated.    Background history 82 yo M followed by urology for recurrent UTIs and urinary retention as well as a personal history of prostate cancer status post brachytherapy in 2006 who recently underwent TURP due to suspicious prostatic findings on cystoscopy after difficult Foley insertion. Unfortunately, surgical pathology shows high grave invasive urothelial carcinoma involving the prostate with squamous differentiation, focal keratinization and extensive necrosis.  Perineural invasion was present. Postoperatively, he was able to void spontaneously until earlier this week when he could no longer urinate and required Foley catheter replacement. Normal LFTs.  Normal alk phos.  No recent cross-sectional abdominal or pelvic imaging.   Chest CT 3 months ago shows interval increased size of posterior right upper pole "band like" nodule measuring 1.4 x 2.3 which is felt to be most compatible with a benign process with multiple other nodules and scarring along with emphysematous changes. He has lost 25 lbs over the past 4 years without trying.  He does live alone and does most of his own ADLs.   He has multiple medical  comorbidities.  He has a personal history of COPD on 02, lung cancer, and prostate cancer as well as history of stroke amongst others. Contrast CT on 11/14/2017 revealed bladder is thick-walled although underdistended/decompressed by indwelling Foley catheter in this patient with known bladder cancer.  Brachytherapy seeds in the prostate.  No findings specific for metastatic disease. PET scan on 11/22/2017 revealed no evidence of local bladder recurrence or metastasis in the pelvis.  No evidence distant metastatic disease. He currently undergoing treatment with Tecentriq (received 1st cycle on 11/23/2017).  He feels he is doing well and is "going to beat this thing."   He is having leakage around the catheter likely due to bladder spasms.  He is wearing depends daily.   Patient denies any gross hematuria, dysuria or suprapubic/flank pain.  Patient denies any fevers, chills, nausea or vomiting.   Pt reports getting his third cycle of Tecentriq today for his high grade urothelial carcinoma. Pt is to return in one month for foley catheter exchanged. He denies any nausea, or blood in the urine and is to doing his best to stay well hydrated.   PMH: Past Medical History:  Diagnosis Date  . Arthritis   . Collagen vascular disease (HCC)    rheumatoid arthritis  . COPD (chronic obstructive pulmonary disease) ([place])   . GERD (gastroesophageal reflux disease)   . History of prostate cancer   . HLD (hyperlipidemia)   . HOH (hard of hearing)   . Insomnia   . Lung cancer ([place]) 2017   rad tx's.  . Oxygen deficit    2L HS AND PRN  . Prostate cancer ([place])  2006   Rad seed tx's.  . Stroke [facility])    2000    Surgical History: Past Surgical History:  Procedure Laterality Date  . APPENDECTOMY  1965  . CATARACT EXTRACTION W/PHACO Right 01/05/2016   Procedure: CATARACT EXTRACTION PHACO AND INTRAOCULAR LENS PLACEMENT (IOC);  Surgeon: [name], MD;  Location: [facility] ORS;  Service: Ophthalmology;   Laterality: Right;  [place] 1.14 AP% 18.1 CDE 13.44 FLUID PACK LOT # [id number] H  . CYSTOSCOPY W/ RETROGRADES Bilateral 11/01/2017   Procedure: CYSTOSCOPY WITH RETROGRADE PYELOGRAM;  Surgeon: Hollice Espy, MD;  Location: [facility] ORS;  Service: Urology;  Laterality: Bilateral;  . ENDOBRONCHIAL ULTRASOUND N/A 02/26/2015   Procedure: ENDOBRONCHIAL ULTRASOUND;  Surgeon: Flora Lipps, MD;  Location: [facility] ORS;  Service: Cardiopulmonary;  Laterality: N/A;  . [place]  . INSERTION PROSTATE RADIATION SEED  2002  . TRANSURETHRAL RESECTION OF PROSTATE N/A 11/01/2017   Procedure: TRANSURETHRAL RESECTION OF THE PROSTATE (TURP) (channel TURP);  Surgeon: Hollice Espy, MD;  Location: [facility] ORS;  Service: Urology;  Laterality: N/A;    Home Medications:  Allergies as of 01/04/2018      Reactions   Plaquenil [hydroxychloroquine]    Bad dreams   Simvastatin Other (See Comments)   Bad dreams      Medication List        Accurate as of 01/04/18  3:12 PM. Always use your most recent med list.          finasteride 5 MG tablet Commonly known as:  PROSCAR Take 5 mg by mouth daily.   Fluticasone-Umeclidin-Vilant 100-62.5-25 MCG/INH Aepb Inhale 1 puff into the lungs daily.   folic acid 1 MG tablet Commonly known as:  FOLVITE Take 1 tablet (1 mg total) by mouth daily.   methotrexate 2.5 MG tablet Commonly known as:  RHEUMATREX Take 5 tablets by mouth once a week.   mirtazapine 30 MG tablet Commonly known as:  REMERON Take 1 tablet (30 mg total) by mouth at bedtime.   OXYGEN Place 2 L into the nose daily as needed.   prochlorperazine 10 MG tablet Commonly known as:  COMPAZINE Take 1 tablet (10 mg total) by mouth every 6 (six) hours as needed (Nausea or vomiting).   tamsulosin 0.4 MG Caps capsule Commonly known as:  FLOMAX Take 1 capsule (0.4 mg total) by mouth daily.   zolpidem 5 MG tablet Commonly known as:  AMBIEN Take 1 tablet (5 mg total) by mouth at bedtime as needed for sleep.        Allergies:  Allergies  Allergen Reactions  . Plaquenil [Hydroxychloroquine]     Bad dreams  . Simvastatin Other (See Comments)    Bad dreams    Family History: Family History  Problem Relation Age of Onset  . Cancer Mother   . Cancer Father        Lung Cancer  . Cancer Sister        breast  . Cancer Brother        stomach    Social History:  reports that he quit smoking about 4 years ago. His smoking use included cigarettes. He has a 25.00 pack-year smoking history. He has never used smokeless tobacco. He reports that he does not drink alcohol or use drugs.  ROS: UROLOGY Frequent Urination?: No Hard to postpone urination?: No Burning/pain with urination?: Yes Get up at night to urinate?: No Leakage of urine?: Yes Urine stream starts and stops?: Yes Trouble starting stream?: Yes Do you have to  strain to urinate?: Yes Blood in urine?: No Urinary tract infection?: No Sexually transmitted disease?: No Injury to kidneys or bladder?: No Painful intercourse?: No Weak stream?: Yes Erection problems?: Yes Penile pain?: Yes  Gastrointestinal Nausea?: No Vomiting?: No Indigestion/heartburn?: No Diarrhea?: No Constipation?: No  Constitutional Fever: No Night sweats?: No Weight loss?: Yes Fatigue?: Yes  Skin Skin rash/lesions?: No Itching?: No  Eyes Blurred vision?: No Double vision?: No  Ears/Nose/Throat Sore throat?: No Sinus problems?: No  Hematologic/Lymphatic Swollen glands?: No Easy bruising?: No  Cardiovascular Leg swelling?: No Chest pain?: No  Respiratory Cough?: Yes Shortness of breath?: Yes  Endocrine Excessive thirst?: No  Musculoskeletal Back pain?: No Joint pain?: Yes  Neurological Headaches?: No Dizziness?: No  Psychologic Depression?: No Anxiety?: No  Physical Exam: BP 120/69 (BP Location: Left Arm, Patient Position: Sitting)   Pulse 91   Ht '5\' 8"'  (1.727 m)   Wt 116 lb (52.6 kg)   BMI 17.64 kg/m     Constitutional: Well nourished. Alert and oriented, No acute distress. HEENT: [place] AT, moist mucus membranes. Trachea midline, no masses. Cardiovascular: No clubbing, cyanosis, or edema. Respiratory: Normal respiratory effort, no increased work of breathing. Skin: No rashes, bruises or suspicious lesions. Lymph: No cervical or inguinal adenopathy. Neurologic: Grossly intact, no focal deficits, moving all 4 extremities. Psychiatric: Normal mood and affect.    Laboratory Data: Results for orders placed or performed in visit on 01/04/18  Comprehensive metabolic panel  Result Value Ref Range   Sodium 139 135 - 145 mmol/L   Potassium 4.1 3.5 - 5.1 mmol/L   Chloride 104 98 - 111 mmol/L   CO2 28 22 - 32 mmol/L   Glucose, Bld 104 (H) 70 - 99 mg/dL   BUN 21 8 - 23 mg/dL   Creatinine, Ser 1.09 0.61 - 1.24 mg/dL   Calcium 9.3 8.9 - 10.3 mg/dL   Total Protein 8.8 (H) 6.5 - 8.1 g/dL   Albumin 2.8 (L) 3.5 - 5.0 g/dL   AST 18 15 - 41 U/L   ALT 10 0 - 44 U/L   Alkaline Phosphatase 65 38 - 126 U/L   Total Bilirubin 0.2 (L) 0.3 - 1.2 mg/dL   GFR calc non Af Amer >60 >60 mL/min   GFR calc Af Amer >60 >60 mL/min   Anion gap 7 5 - 15  CBC with Differential  Result Value Ref Range   WBC 6.1 4.0 - 10.5 K/uL   RBC 3.47 (L) 4.22 - 5.81 MIL/uL   Hemoglobin 10.0 (L) 13.0 - 17.0 g/dL   HCT 33.0 (L) 39.0 - 52.0 %   MCV 95.1 80.0 - 100.0 fL   MCH 28.8 26.0 - 34.0 pg   MCHC 30.3 30.0 - 36.0 g/dL   RDW 17.1 (H) 11.5 - 15.5 %   Platelets 265 150 - 400 K/uL   nRBC 0.0 0.0 - 0.2 %   Neutrophils Relative % 80 %   Neutro Abs 5.0 1.7 - 7.7 K/uL   Lymphocytes Relative 13 %   Lymphs Abs 0.8 0.7 - 4.0 K/uL   Monocytes Relative 4 %   Monocytes Absolute 0.2 0.1 - 1.0 K/uL   Eosinophils Relative 1 %   Eosinophils Absolute 0.1 0.0 - 0.5 K/uL   Basophils Relative 1 %   Basophils Absolute 0.0 0.0 - 0.1 K/uL   Immature Granulocytes 1 %   Abs Immature Granulocytes 0.03 0.00 - 0.07 K/uL   Lab Results   Component Value Date   WBC  6.1 01/04/2018   HGB 10.0 (L) 01/04/2018   HCT 33.0 (L) 01/04/2018   MCV 95.1 01/04/2018   PLT 265 01/04/2018    Lab Results  Component Value Date   CREATININE 1.09 01/04/2018   Urinalysis N/a I have reviewed the labs.    ROS UROLOGY Frequent Urination?: No Hard to postpone urination?: No Burning/pain with urination?: Yes Get up at night to urinate?: No Leakage of urine?: Yes Urine stream starts and stops?: Yes Trouble starting stream?: Yes Do you have to strain to urinate?: Yes Blood in urine?: No Urinary tract infection?: No Sexually transmitted disease?: No Injury to kidneys or bladder?: No Painful intercourse?: No Weak stream?: Yes Erection problems?: Yes Penile pain?: Yes Gastrointestinal Nausea?: No Vomiting?: No Indigestion/heartburn?: No Diarrhea?: No Constipation?: No Constitutional Fever: No Night sweats?: No Weight loss?: Yes Fatigue?: Yes Skin Skin rash/lesions?: No Itching?: No Eyes Blurred vision?: No Double vision?: No Ears/Nose/Throat Sore throat?: No Sinus problems?: No Hematologic/Lymphatic Swollen glands?: No Easy bruising?: No Cardiovascular Leg swelling?: No Chest pain?: No Respiratory Cough?: Yes Shortness of breath?: Yes Endocrine Excessive thirst?: No Musculoskeletal Back pain?: No Joint pain?: Yes Neurological Headaches?: No Dizziness?: No Psychologic Depression?: No Anxiety?: No   Pertinent Imaging CLINICAL DATA:  Subsequent treatment strategy for urothelial carcinoma of the bladder. Lung cancer diagnosed 2017. RIGHT upper lobe SP RT.  EXAM: NUCLEAR MEDICINE PET SKULL BASE TO THIGH  TECHNIQUE: 6.5 mCi F-18 FDG was injected intravenously. Full-ring PET imaging was performed from the skull base to thigh after the radiotracer. CT data was obtained and used for attenuation correction and anatomic localization.  Fasting blood glucose: 80 mg/dl  COMPARISON:  PET-CT  06/23/2015  FINDINGS: Mediastinal blood pool activity: SUV max 1.7  NECK: No hypermetabolic lymph nodes in the neck.  Incidental CT findings: none  CHEST: There is reticular scarring thickening in the RIGHT upper lobe at the previous focal hypermetabolic nodule. There is mild metabolic activity with SUV max equal 2.3 decreased from 9.3 following therapy. No discrete nodularity. Phase favored post radiation change.  There is severe centrilobular emphysema the upper lobes. There is bullous change in the RIGHT upper lobe.  No new pulmonary nodules.  Incidental CT findings: None  ABDOMEN/PELVIS:  No abnormal metabolic activity liver. No hypermetabolic abdominopelvic lymph nodes.  There is no evidence of local bladder cancer recurrence. Intense radiotracer activity within the bladder does limit evaluation of the bladder near bladder tissues.  Foley catheter in place.  No abnormal metabolic activity the prostate bed.  No hypermetabolic pelvic lymph nodes.  Incidental CT findings: none  SKELETON: No focal hypermetabolic activity to suggest skeletal metastasis.  Incidental CT findings: none  IMPRESSION: 1. No evidence of local bladder recurrence or metastasis in the pelvis. 2. No evidence distant metastatic disease. 3. The bladder itself is not well evaluated by FDG PET imaging. 4. Reticular scarring in the RIGHT upper lobe at SB RT site with mild metabolic activity is favored benign post radiation change. 5. Aortic Atherosclerosis (ICD10-I70.0) and Emphysema (ICD10-J43.9).   Electronically Signed   By: Suzy Bouchard M.D.   On: 11/22/2017 15:37   Assessment & Plan:    1. Malignant neoplasm of urinary bladder neck (HCC)  Newly diagnosed high-grade invasive urothelial carcinoma involving the prostate with squamous differentiation CT Chest scheduled for 07/2018 Received 1 cycle of Tecentriq on 11/23/2017 - received next cycle on  10/24-received next cycle 01/04/2018 Will require periodic cystoscopies to assess for therapy response  2. Urinary retention Maintain Foley  catheter Foley exchanged today RTC in one month for next exchange   3. History of prostate cancer Recent PSA undetectable 08/2017 No evidence of disease   [name], PA-C  [facility] [address], [place], [place] [address] [phone]  I, Lucas Mallow, am acting as a Education administrator for [employer],  I have reviewed the above documentation for accuracy and completeness, and I agree with the above.    [name], PA-C

## 2018-01-05 LAB — THYROID PANEL WITH TSH
FREE THYROXINE INDEX: 2.4 (ref 1.2–4.9)
T3 UPTAKE RATIO: 29 % (ref 24–39)
T4, Total: 8.4 ug/dL (ref 4.5–12.0)
TSH: 1.99 u[IU]/mL (ref 0.450–4.500)

## 2018-01-09 NOTE — Telephone Encounter (Signed)
Left message about Dr. Ancil Boozer sending patient in Remeron to help him sleep but to also follow good sleep hygiene habits with it.

## 2018-01-25 ENCOUNTER — Inpatient Hospital Stay: Payer: Medicare HMO | Attending: Nurse Practitioner

## 2018-01-25 ENCOUNTER — Inpatient Hospital Stay: Payer: Medicare HMO

## 2018-01-25 VITALS — BP 118/60 | HR 87 | Temp 98.2°F | Resp 18 | Wt 115.4 lb

## 2018-01-25 DIAGNOSIS — Z5112 Encounter for antineoplastic immunotherapy: Secondary | ICD-10-CM | POA: Insufficient documentation

## 2018-01-25 DIAGNOSIS — C679 Malignant neoplasm of bladder, unspecified: Secondary | ICD-10-CM

## 2018-01-25 DIAGNOSIS — Z923 Personal history of irradiation: Secondary | ICD-10-CM | POA: Insufficient documentation

## 2018-01-25 LAB — CBC WITH DIFFERENTIAL/PLATELET
ABS IMMATURE GRANULOCYTES: 0.03 10*3/uL (ref 0.00–0.07)
BASOS PCT: 1 %
Basophils Absolute: 0 10*3/uL (ref 0.0–0.1)
EOS ABS: 0.1 10*3/uL (ref 0.0–0.5)
EOS PCT: 1 %
HCT: 33.4 % — ABNORMAL LOW (ref 39.0–52.0)
Hemoglobin: 10 g/dL — ABNORMAL LOW (ref 13.0–17.0)
Immature Granulocytes: 1 %
Lymphocytes Relative: 14 %
Lymphs Abs: 0.9 10*3/uL (ref 0.7–4.0)
MCH: 28.4 pg (ref 26.0–34.0)
MCHC: 29.9 g/dL — AB (ref 30.0–36.0)
MCV: 94.9 fL (ref 80.0–100.0)
MONO ABS: 0.7 10*3/uL (ref 0.1–1.0)
Monocytes Relative: 11 %
Neutro Abs: 4.6 10*3/uL (ref 1.7–7.7)
Neutrophils Relative %: 72 %
PLATELETS: 349 10*3/uL (ref 150–400)
RBC: 3.52 MIL/uL — AB (ref 4.22–5.81)
RDW: 17.7 % — AB (ref 11.5–15.5)
WBC: 6.3 10*3/uL (ref 4.0–10.5)
nRBC: 0 % (ref 0.0–0.2)

## 2018-01-25 LAB — COMPREHENSIVE METABOLIC PANEL
ALK PHOS: 67 U/L (ref 38–126)
ALT: 9 U/L (ref 0–44)
AST: 18 U/L (ref 15–41)
Albumin: 2.7 g/dL — ABNORMAL LOW (ref 3.5–5.0)
Anion gap: 8 (ref 5–15)
BILIRUBIN TOTAL: 0.3 mg/dL (ref 0.3–1.2)
BUN: 21 mg/dL (ref 8–23)
CALCIUM: 8.8 mg/dL — AB (ref 8.9–10.3)
CO2: 30 mmol/L (ref 22–32)
Chloride: 101 mmol/L (ref 98–111)
Creatinine, Ser: 1.11 mg/dL (ref 0.61–1.24)
GFR calc Af Amer: >60 mL/min (ref >60)
Glucose, Bld: 125 mg/dL — ABNORMAL HIGH (ref 70–99)
Potassium: 3.8 mmol/L (ref 3.5–5.1)
Sodium: 139 mmol/L (ref 135–145)
TOTAL PROTEIN: 8.7 g/dL — AB (ref 6.5–8.1)

## 2018-01-25 MED ORDER — SODIUM CHLORIDE 0.9 % IV SOLN
Freq: Once | INTRAVENOUS | Status: AC
  Administered 2018-01-25: 14:00:00 via INTRAVENOUS
  Filled 2018-01-25: qty 250

## 2018-01-25 MED ORDER — SODIUM CHLORIDE 0.9 % IV SOLN
1200.0000 mg | Freq: Once | INTRAVENOUS | Status: AC
  Administered 2018-01-25: 1200 mg via INTRAVENOUS
  Filled 2018-01-25: qty 20

## 2018-01-26 LAB — THYROID PANEL WITH TSH
FREE THYROXINE INDEX: 2.6 (ref 1.2–4.9)
T3 UPTAKE RATIO: 32 % (ref 24–39)
T4, Total: 8.2 ug/dL (ref 4.5–12.0)
TSH: 0.932 u[IU]/mL (ref 0.450–4.500)

## 2018-02-01 ENCOUNTER — Ambulatory Visit: Payer: Medicare HMO | Admitting: Urology

## 2018-02-02 ENCOUNTER — Encounter: Payer: Self-pay | Admitting: Urology

## 2018-02-02 ENCOUNTER — Ambulatory Visit (INDEPENDENT_AMBULATORY_CARE_PROVIDER_SITE_OTHER): Payer: Medicare HMO | Admitting: Urology

## 2018-02-02 VITALS — BP 95/57 | HR 89 | Ht 68.0 in | Wt 111.5 lb

## 2018-02-02 DIAGNOSIS — C675 Malignant neoplasm of bladder neck: Secondary | ICD-10-CM

## 2018-02-02 DIAGNOSIS — R339 Retention of urine, unspecified: Secondary | ICD-10-CM | POA: Diagnosis not present

## 2018-02-02 LAB — URINALYSIS, COMPLETE
Bilirubin, UA: NEGATIVE
Glucose, UA: NEGATIVE
Ketones, UA: NEGATIVE
Nitrite, UA: NEGATIVE
Specific Gravity, UA: >1.03 — ABNORMAL HIGH (ref 1.005–1.030)
Urobilinogen, Ur: 1 mg/dL (ref 0.2–1.0)
pH, UA: 6 (ref 5.0–7.5)

## 2018-02-02 LAB — MICROSCOPIC EXAMINATION

## 2018-02-02 NOTE — Progress Notes (Signed)
Cath Change/ Replacement  Patient is present today for a catheter change due to urinary retention.  43ml of water was removed from the balloon, a 16FR foley cath was removed with out difficulty.  Patient was cleaned and prepped in a sterile fashion with betadine and 2% lidocaine jelly was instilled into the urethra. A 16 FR foley cath was replaced into the bladder no complications were noted Urine return was noted 23ml and urine was yellow in color. The balloon was filled with 41ml of sterile water. A leg bag was attached for drainage.   Preformed by: Elberta Leatherwood, CMA  Follow up: 1 month Cath change

## 2018-02-06 ENCOUNTER — Ambulatory Visit: Payer: Medicare HMO | Admitting: Urology

## 2018-02-06 ENCOUNTER — Ambulatory Visit: Payer: Self-pay | Admitting: *Deleted

## 2018-02-06 NOTE — Telephone Encounter (Signed)
  Pt called complaining of no appetite and wanting a medication called in to him. Advised that he should be seen by his provider if has no appetite for 2 weeks now. He denies having sores in his mouth but has a sore spot on the gum, right in the front.  Denies nausea or vomiting. He usually eats eggs and sausage for breakfast and maybe eat something else afterwards. He has Ensure or Boost that he will drink. And also drinks water. He is losing weight. Appointment scheduled per protocol. Routing to flow at [facility]. Reason for Disposition . Nursing judgment or information in reference  Answer Assessment - Initial Assessment Questions 1. REASON FOR CALL: "What is your main concern right now?"     Loss of appetite 2. ONSET: "When did the appetite start?"     Last 2 weeks 3. SEVERITY: "How bad is the loss of appetite?"     Lost weight, probably 15 lbs in the last 2 months 4. FEVER: "Do you have a fever?"     no 5. OTHER SYMPTOMS: "Do you have any other new symptoms?"     Some weakness, has bladder spasms at times (bladder cancer) 6. INTERVENTIONS AND RESPONSE: "What have you done so far to try to make this better? What medications have you used?"     Not really 7. PREGNANCY: "Is there any chance you are pregnant?"     n/a  Protocols used: NO GUIDELINE AVAILABLE-A-AH

## 2018-02-07 LAB — CULTURE, URINE COMPREHENSIVE

## 2018-02-08 ENCOUNTER — Other Ambulatory Visit: Payer: Self-pay

## 2018-02-08 ENCOUNTER — Telehealth: Payer: Self-pay | Admitting: Urology

## 2018-02-08 ENCOUNTER — Ambulatory Visit (INDEPENDENT_AMBULATORY_CARE_PROVIDER_SITE_OTHER): Payer: Medicare HMO | Admitting: Family Medicine

## 2018-02-08 ENCOUNTER — Encounter: Payer: Self-pay | Admitting: Family Medicine

## 2018-02-08 VITALS — BP 94/60 | HR 86 | Temp 97.5°F | Resp 22 | Ht 68.0 in | Wt 112.3 lb

## 2018-02-08 DIAGNOSIS — R918 Other nonspecific abnormal finding of lung field: Secondary | ICD-10-CM | POA: Diagnosis not present

## 2018-02-08 DIAGNOSIS — J449 Chronic obstructive pulmonary disease, unspecified: Secondary | ICD-10-CM

## 2018-02-08 DIAGNOSIS — IMO0002 Reserved for concepts with insufficient information to code with codable children: Secondary | ICD-10-CM

## 2018-02-08 DIAGNOSIS — G4709 Other insomnia: Secondary | ICD-10-CM

## 2018-02-08 DIAGNOSIS — C675 Malignant neoplasm of bladder neck: Secondary | ICD-10-CM

## 2018-02-08 DIAGNOSIS — I4891 Unspecified atrial fibrillation: Secondary | ICD-10-CM | POA: Diagnosis not present

## 2018-02-08 DIAGNOSIS — A498 Other bacterial infections of unspecified site: Secondary | ICD-10-CM

## 2018-02-08 DIAGNOSIS — M069 Rheumatoid arthritis, unspecified: Secondary | ICD-10-CM

## 2018-02-08 DIAGNOSIS — I69354 Hemiplegia and hemiparesis following cerebral infarction affecting left non-dominant side: Secondary | ICD-10-CM

## 2018-02-08 DIAGNOSIS — I4892 Unspecified atrial flutter: Secondary | ICD-10-CM

## 2018-02-08 DIAGNOSIS — E440 Moderate protein-calorie malnutrition: Secondary | ICD-10-CM

## 2018-02-08 MED ORDER — MEGESTROL ACETATE 20 MG PO TABS: 20.0000 mg | ORAL_TABLET | Freq: Two times a day (BID) | ORAL | 0 refills | Status: DC

## 2018-02-08 MED ORDER — QUETIAPINE FUMARATE 25 MG PO TABS: 25.0000 mg | ORAL_TABLET | Freq: Every day | ORAL | 0 refills | Status: DC

## 2018-02-08 MED ORDER — CEFUROXIME AXETIL 250 MG PO TABS: 250.0000 mg | ORAL_TABLET | Freq: Two times a day (BID) | ORAL | 0 refills | Status: DC

## 2018-02-08 NOTE — Progress Notes (Signed)
Name: Collin Gonzales   MRN: [id number]    DOB: Mar 01, 1935   Date:02/08/2018       Progress Note  Subjective  Chief Complaint  Chief Complaint  Patient presents with  . Anorexia    x 2 weeks    HPI  He was here today with grand-daughter: Charmece , and daughter: [name]   Malnutrition: his weight has gone down from 140 lbs 04/2015 down to 112 lbs 2019 and has been stable for months however he feels like he cannot a lot and would like something to improve his appetite. .He has tried increasing calorie intake also     COPD/emphysema: history of CAP back 02/2017, he is doing better now, back to baseline cough and SOB, he is oxygen dependent 2 liters. Hewas treated for right lung cancer, treated by Dr. Baruch Gouty with radiation and is only being monitored now.  He is using Trelegy. He has not seen Dr. Raul Del in over one year  Atherosclerosis of aorta and coronary vessel disease: found on CT scan done 07/2016. He states he stopped taking statin and aspirin.  Discussed cardiovascular risk, however because of anemia he will stay off aspirin, he had weird dreams with statin and does not want to go back on medication . He also has a history of CVA, with weakness of left forefoot, but hedenies any gait instability  History of prostate cancer now with bladder cancer : history of prostate seed implants, hehas recurrent UTIs and recent urinary retention, under the care of Dr. Pilar Jarvis and [name] . He is has a Foley that is changed once a month at Urologist , no blood in urine, he has intermittent penis pain that is brief and intense  GERD: he has been off omeprazole, no heartburn at this time. Unchanged   RA: seeing Dr. Jefm Bryant, takes methothrexate once a week,down to 5pills per week,denies joint pains or swelling, no pain at this time  Chronic insomnia: he failed Trazodone, Remeron, and also Ambien 5 mg, we will try seroquel to see if helps with sleep, also discussed sleep  hygiened   History of CVA stable weakness on left side, refuses statin therapy and aspirin  Afib: not on aspirin, denies palpitation  Patient Active Problem List   Diagnosis Date Noted  . Urothelial carcinoma of bladder ([place]) 11/12/2017  . Urinary retention 11/01/2017  . Dependence on supplemental oxygen 04/24/2017  . Malnutrition ([place]) 04/24/2017  . Hemiparesis affecting left side as late effect of cerebrovascular accident (CVA) ([place]) 04/24/2017  . Cataract 09/06/2016  . Drug-induced folate deficiency anemia 09/06/2016  . Esophagitis, reflux 09/06/2016  . Hypogammaglobulinemia ([place]) 09/06/2016  . Iron deficiency anemia due to chronic blood loss 08/23/2016  . Pancytopenia ([place]) 08/22/2016  . Decrease in appetite 09/01/2015  . GERD (gastroesophageal reflux disease) 08/05/2015  . COPD (chronic obstructive pulmonary disease) ([place]) 08/20/2014  . Dyslipidemia 08/20/2014  . Chronic insomnia 08/20/2014  . Arthritis or polyarthritis, rheumatoid ([place]) 08/20/2014  . History of prostate cancer 11/01/2013  . Flutter-fibrillation ([place]) 03/18/2005    Past Surgical History:  Procedure Laterality Date  . APPENDECTOMY  1965  . CATARACT EXTRACTION W/PHACO Right 01/05/2016   Procedure: CATARACT EXTRACTION PHACO AND INTRAOCULAR LENS PLACEMENT (IOC);  Surgeon: [name], MD;  Location: [facility] ORS;  Service: Ophthalmology;  Laterality: Right;  [place] 1.14 AP% 18.1 CDE 13.44 FLUID PACK LOT # [id number]  . CYSTOSCOPY W/ RETROGRADES Bilateral 11/01/2017   Procedure: CYSTOSCOPY WITH RETROGRADE PYELOGRAM;  Surgeon: Hollice Espy, MD;  Location: [facility]  ORS;  Service: Urology;  Laterality: Bilateral;  . ENDOBRONCHIAL ULTRASOUND N/A 02/26/2015   Procedure: ENDOBRONCHIAL ULTRASOUND;  Surgeon: Flora Lipps, MD;  Location: [facility] ORS;  Service: Cardiopulmonary;  Laterality: N/A;  . [place]  . INSERTION PROSTATE RADIATION SEED  2002  . TRANSURETHRAL RESECTION OF PROSTATE N/A 11/01/2017   Procedure:  TRANSURETHRAL RESECTION OF THE PROSTATE (TURP) (channel TURP);  Surgeon: Hollice Espy, MD;  Location: [facility] ORS;  Service: Urology;  Laterality: N/A;    Family History  Problem Relation Age of Onset  . Cancer Mother   . Cancer Father        Lung Cancer  . Cancer Sister        breast  . Cancer Brother        stomach    Social History   Socioeconomic History  . Marital status: Divorced    Spouse name: Not on file  . Number of children: 5  . Years of education: Not on file  . Highest education level: 12th grade  Occupational History  . Occupation: Retired  Scientific laboratory technician  . Financial resource strain: Not hard at all  . Food insecurity:    Worry: Never true    Inability: Never true  . Transportation needs:    Medical: No    Non-medical: No  Tobacco Use  . Smoking status: Former Smoker    Packs/day: 0.50    Years: 50.00    Pack years: 25.00    Types: Cigarettes    Last attempt to quit: 02/21/2013    Years since quitting: 4.9  . Smokeless tobacco: Never Used  . Tobacco comment: smoking cessation materials not required  Substance and Sexual Activity  . Alcohol use: No    Alcohol/week: 0.0 standard drinks  . Drug use: No  . Sexual activity: Never  Lifestyle  . Physical activity:    Days per week: 0 days    Minutes per session: 0 min  . Stress: Not at all  Relationships  . Social connections:    Talks on phone: Patient refused    Gets together: Patient refused    Attends religious service: Patient refused    Active member of club or organization: Patient refused    Attends meetings of clubs or organizations: Patient refused    Relationship status: Divorced  . Intimate partner violence:    Fear of current or ex partner: No    Emotionally abused: No    Physically abused: No    Forced sexual activity: No  Other Topics Concern  . Not on file  Social History Narrative  . Not on file     Current Outpatient Medications:  .  finasteride (PROSCAR) 5 MG tablet,  Take 5 mg by mouth daily., Disp: , Rfl: 11 .  Fluticasone-Umeclidin-Vilant (TRELEGY ELLIPTA) 100-62.5-25 MCG/INH AEPB, Inhale 1 puff into the lungs daily., Disp: 60 each, Rfl: 5 .  folic acid (FOLVITE) 1 MG tablet, Take 1 tablet (1 mg total) by mouth daily., Disp: 30 tablet, Rfl: 5 .  methotrexate (RHEUMATREX) 2.5 MG tablet, Take 5 tablets by mouth once a week., Disp: , Rfl:  .  OXYGEN, Place 2 L into the nose daily as needed. , Disp: , Rfl:  .  prochlorperazine (COMPAZINE) 10 MG tablet, Take 1 tablet (10 mg total) by mouth every 6 (six) hours as needed (Nausea or vomiting)., Disp: 60 tablet, Rfl: 2 .  tamsulosin (FLOMAX) 0.4 MG CAPS capsule, Take 1 capsule (0.4 mg total) by mouth  daily. (Patient taking differently: Take 0.8 mg by mouth daily. ), Disp: 30 capsule, Rfl: 11 .  megestrol (MEGACE) 20 MG tablet, Take 1 tablet (20 mg total) by mouth 2 (two) times daily., Disp: 60 tablet, Rfl: 0 .  QUEtiapine (SEROQUEL) 25 MG tablet, Take 1 tablet (25 mg total) by mouth at bedtime., Disp: 30 tablet, Rfl: 0  Allergies  Allergen Reactions  . Plaquenil [Hydroxychloroquine]     Bad dreams  . Simvastatin Other (See Comments)    Bad dreams    I personally reviewed active problem list, medication list, allergies, family history, social history with the patient/caregiver today.   ROS  Constitutional: Negative for fever , positive for  weight change.  Respiratory: Positive  for cough and shortness of breath.   Cardiovascular: Negative for chest pain or palpitations.  Gastrointestinal: Negative for abdominal pain, positive for  bowel changes - not as often .  Musculoskeletal: positive  for gait problem and intermittent  joint swelling.  Skin: Negative for rash.  Neurological: Negative for dizziness or headache.  No other specific complaints in a complete review of systems (except as listed in HPI above).  Objective  Vitals:   02/08/18 0930  BP: 94/60  Pulse: 86  Resp: 16  Temp: (!) 97.5 F  (36.4 C)  TempSrc: Oral  SpO2: 90%  Weight: 112 lb 4.8 oz (50.9 kg)  Height: _0  (1.727 m)    Body mass index is 17.08 kg/m.  Physical Exam  Constitutional: Patient appears well-developed and malnourished. Positive for respiratory distress.  HEENT: head atraumatic, normocephalic, pupils equal and reactive to light,  neck supple, throat within normal limits Cardiovascular: Normal rate, regular rhythm and normal heart sounds.  No murmur heard. No BLE edema. Pulmonary/Chest: Effort normal and breath sounds normal. tachypnea  Abdominal: Soft.  There is no tenderness. Psychiatric: Patient has a normal mood and affect. behavior is normal. Judgment and thought content normal.  Recent Results (from the past 2160 hour(s))  Glucose, capillary     Status: None   Collection Time: 11/22/17 11:15 AM  Result Value Ref Range   Glucose-Capillary 80 70 - 99 mg/dL  Comprehensive metabolic panel     Status: Abnormal   Collection Time: 11/23/17  8:14 AM  Result Value Ref Range   Sodium 142 135 - 145 mmol/L   Potassium 3.7 3.5 - 5.1 mmol/L   Chloride 104 98 - 111 mmol/L   CO2 29 22 - 32 mmol/L   Glucose, Bld 95 70 - 99 mg/dL   BUN 16 8 - 23 mg/dL   Creatinine, Ser 1.20 0.61 - 1.24 mg/dL   Calcium 8.9 8.9 - 10.3 mg/dL   Total Protein 8.1 6.5 - 8.1 g/dL   Albumin 2.8 (L) 3.5 - 5.0 g/dL   AST 18 15 - 41 U/L   ALT 8 0 - 44 U/L   Alkaline Phosphatase 73 38 - 126 U/L   Total Bilirubin 0.3 0.3 - 1.2 mg/dL   GFR calc non Af Amer 56 (L) >60 mL/min   GFR calc Af Amer >60 >60 mL/min    Comment: (NOTE) The eGFR has been calculated using the CKD EPI equation. This calculation has not been validated in all clinical situations. eGFR's persistently <60 mL/min signify possible Chronic Kidney Disease.    Anion gap 9 5 - 15    Comment: Performed at [facility], [address] [place], [place] [address]  CBC with Differential     Status: Abnormal  Collection Time: 11/23/17  8:14 AM  Result  Value Ref Range   WBC 8.0 3.8 - 10.6 K/uL   RBC 3.28 (L) 4.40 - 5.90 MIL/uL   Hemoglobin 10.3 (L) 13.0 - 18.0 g/dL   HCT 31.3 (L) 40.0 - 52.0 %   MCV 95.5 80.0 - 100.0 fL   MCH 31.4 26.0 - 34.0 pg   MCHC 32.9 32.0 - 36.0 g/dL   RDW 16.5 (H) 11.5 - 14.5 %   Platelets 427 150 - 440 K/uL   Neutrophils Relative % 67 %   Neutro Abs 5.4 1.4 - 6.5 K/uL   Lymphocytes Relative 19 %   Lymphs Abs 1.5 1.0 - 3.6 K/uL   Monocytes Relative 11 %   Monocytes Absolute 0.9 0.2 - 1.0 K/uL   Eosinophils Relative 2 %   Eosinophils Absolute 0.1 0 - 0.7 K/uL   Basophils Relative 1 %   Basophils Absolute 0.1 0 - 0.1 K/uL    Comment: Performed at [facility], [address] [place], [place] [address]  Thyroid Panel With TSH     Status: None   Collection Time: 11/23/17  8:14 AM  Result Value Ref Range   TSH 2.960 0.450 - 4.500 uIU/mL   T4, Total 7.7 4.5 - 12.0 ug/dL   T3 Uptake Ratio 29 24 - 39 %   Free Thyroxine Index 2.2 1.2 - 4.9    Comment: (NOTE) Performed At: [facility] [place], [place] [id number] Rush Farmer MD FY:[id number]   Comprehensive metabolic panel     Status: Abnormal   Collection Time: 12/14/17  9:26 AM  Result Value Ref Range   Sodium 137 135 - 145 mmol/L   Potassium 3.7 3.5 - 5.1 mmol/L   Chloride 103 98 - 111 mmol/L   CO2 29 22 - 32 mmol/L   Glucose, Bld 99 70 - 99 mg/dL   BUN 19 8 - 23 mg/dL   Creatinine, Ser 1.08 0.61 - 1.24 mg/dL   Calcium 9.0 8.9 - 10.3 mg/dL   Total Protein 8.4 (H) 6.5 - 8.1 g/dL   Albumin 2.7 (L) 3.5 - 5.0 g/dL   AST 18 15 - 41 U/L   ALT 9 0 - 44 U/L   Alkaline Phosphatase 63 38 - 126 U/L   Total Bilirubin 0.4 0.3 - 1.2 mg/dL   GFR calc non Af Amer >60 >60 mL/min   GFR calc Af Amer >60 >60 mL/min    Comment: (NOTE) The eGFR has been calculated using the CKD EPI equation. This calculation has not been validated in all clinical situations. eGFR's persistently <60 mL/min signify possible Chronic  Kidney Disease.    Anion gap 5 5 - 15    Comment: Performed at [facility], [address] [place], [place] [address]  CBC with Differential     Status: Abnormal   Collection Time: 12/14/17  9:26 AM  Result Value Ref Range   WBC 5.2 4.0 - 10.5 K/uL   RBC 3.12 (L) 4.22 - 5.81 MIL/uL   Hemoglobin 9.3 (L) 13.0 - 17.0 g/dL   HCT 30.1 (L) 39.0 - 52.0 %   MCV 96.5 80.0 - 100.0 fL   MCH 29.8 26.0 - 34.0 pg   MCHC 30.9 30.0 - 36.0 g/dL   RDW 16.4 (H) 11.5 - 15.5 %   Platelets 161 150 - 400 K/uL   nRBC 0.0 0.0 - 0.2 %   Neutrophils Relative % 75 %   Neutro Abs  3.9 1.7 - 7.7 K/uL   Lymphocytes Relative 15 %   Lymphs Abs 0.8 0.7 - 4.0 K/uL   Monocytes Relative 6 %   Monocytes Absolute 0.3 0.1 - 1.0 K/uL   Eosinophils Relative 4 %   Eosinophils Absolute 0.2 0.0 - 0.5 K/uL   Basophils Relative 0 %   Basophils Absolute 0.0 0.0 - 0.1 K/uL   Immature Granulocytes 0 %   Abs Immature Granulocytes 0.02 0.00 - 0.07 K/uL    Comment: Performed at [facility], [address] [place], [place] [address]  Thyroid Panel With TSH     Status: None   Collection Time: 12/14/17  9:26 AM  Result Value Ref Range   TSH 1.650 0.450 - 4.500 uIU/mL   T4, Total 8.3 4.5 - 12.0 ug/dL   T3 Uptake Ratio 30 24 - 39 %   Free Thyroxine Index 2.5 1.2 - 4.9    Comment: (NOTE) Performed At: [facility] [place], [place] [id number] Rush Farmer MD TD:[id number]   Comprehensive metabolic panel     Status: Abnormal   Collection Time: 01/04/18  8:07 AM  Result Value Ref Range   Sodium 139 135 - 145 mmol/L   Potassium 4.1 3.5 - 5.1 mmol/L   Chloride 104 98 - 111 mmol/L   CO2 28 22 - 32 mmol/L   Glucose, Bld 104 (H) 70 - 99 mg/dL   BUN 21 8 - 23 mg/dL   Creatinine, Ser 1.09 0.61 - 1.24 mg/dL   Calcium 9.3 8.9 - 10.3 mg/dL   Total Protein 8.8 (H) 6.5 - 8.1 g/dL   Albumin 2.8 (L) 3.5 - 5.0 g/dL   AST 18 15 - 41 U/L   ALT 10 0 - 44 U/L   Alkaline Phosphatase 65 38 - 126 U/L    Total Bilirubin 0.2 (L) 0.3 - 1.2 mg/dL   GFR calc non Af Amer >60 >60 mL/min   GFR calc Af Amer >60 >60 mL/min    Comment: (NOTE) The eGFR has been calculated using the CKD EPI equation. This calculation has not been validated in all clinical situations. eGFR's persistently <60 mL/min signify possible Chronic Kidney Disease.    Anion gap 7 5 - 15    Comment: Performed at [facility], [address] [place],  [address]  CBC with Differential     Status: Abnormal   Collection Time: 01/04/18  8:07 AM  Result Value Ref Range   WBC 6.1 4.0 - 10.5 K/uL   RBC 3.47 (L) 4.22 - 5.81 MIL/uL   Hemoglobin 10.0 (L) 13.0 - 17.0 g/dL   HCT 33.0 (L) 39.0 - 52.0 %   MCV 95.1 80.0 - 100.0 fL   MCH 28.8 26.0 - 34.0 pg   MCHC 30.3 30.0 - 36.0 g/dL   RDW 17.1 (H) 11.5 - 15.5 %   Platelets 265 150 - 400 K/uL   nRBC 0.0 0.0 - 0.2 %   Neutrophils Relative % 80 %   Neutro Abs 5.0 1.7 - 7.7 K/uL   Lymphocytes Relative 13 %   Lymphs Abs 0.8 0.7 - 4.0 K/uL   Monocytes Relative 4 %   Monocytes Absolute 0.2 0.1 - 1.0 K/uL   Eosinophils Relative 1 %   Eosinophils Absolute 0.1 0.0 - 0.5 K/uL   Basophils Relative 1 %   Basophils Absolute 0.0 0.0 - 0.1 K/uL   Immature Granulocytes 1 %   Abs Immature Granulocytes 0.03 0.00 - 0.07 K/uL  Comment: Performed at [facility], [address] [place], [place] [address]  Thyroid Panel With TSH     Status: None   Collection Time: 01/04/18  8:07 AM  Result Value Ref Range   TSH 1.990 0.450 - 4.500 uIU/mL   T4, Total 8.4 4.5 - 12.0 ug/dL   T3 Uptake Ratio 29 24 - 39 %   Free Thyroxine Index 2.4 1.2 - 4.9    Comment: (NOTE) Performed At: [facility] [place], [place] [id number] Rush Farmer MD WF:[id number]   Comprehensive metabolic panel     Status: Abnormal   Collection Time: 01/25/18  1:13 PM  Result Value Ref Range   Sodium 139 135 - 145 mmol/L   Potassium 3.8 3.5 - 5.1 mmol/L   Chloride 101 98 - 111  mmol/L   CO2 30 22 - 32 mmol/L   Glucose, Bld 125 (H) 70 - 99 mg/dL   BUN 21 8 - 23 mg/dL   Creatinine, Ser 1.11 0.61 - 1.24 mg/dL   Calcium 8.8 (L) 8.9 - 10.3 mg/dL   Total Protein 8.7 (H) 6.5 - 8.1 g/dL   Albumin 2.7 (L) 3.5 - 5.0 g/dL   AST 18 15 - 41 U/L   ALT 9 0 - 44 U/L   Alkaline Phosphatase 67 38 - 126 U/L   Total Bilirubin 0.3 0.3 - 1.2 mg/dL   GFR calc non Af Amer >60 >60 mL/min   GFR calc Af Amer >60 >60 mL/min   Anion gap 8 5 - 15    Comment: Performed at [facility], [address] [place], [place] [address]  CBC with Differential     Status: Abnormal   Collection Time: 01/25/18  1:13 PM  Result Value Ref Range   WBC 6.3 4.0 - 10.5 K/uL   RBC 3.52 (L) 4.22 - 5.81 MIL/uL   Hemoglobin 10.0 (L) 13.0 - 17.0 g/dL   HCT 33.4 (L) 39.0 - 52.0 %   MCV 94.9 80.0 - 100.0 fL   MCH 28.4 26.0 - 34.0 pg   MCHC 29.9 (L) 30.0 - 36.0 g/dL   RDW 17.7 (H) 11.5 - 15.5 %   Platelets 349 150 - 400 K/uL   nRBC 0.0 0.0 - 0.2 %   Neutrophils Relative % 72 %   Neutro Abs 4.6 1.7 - 7.7 K/uL   Lymphocytes Relative 14 %   Lymphs Abs 0.9 0.7 - 4.0 K/uL   Monocytes Relative 11 %   Monocytes Absolute 0.7 0.1 - 1.0 K/uL   Eosinophils Relative 1 %   Eosinophils Absolute 0.1 0.0 - 0.5 K/uL   Basophils Relative 1 %   Basophils Absolute 0.0 0.0 - 0.1 K/uL   Immature Granulocytes 1 %   Abs Immature Granulocytes 0.03 0.00 - 0.07 K/uL    Comment: Performed at [facility], [address] [place], [place] [address]  Thyroid Panel With TSH     Status: None   Collection Time: 01/25/18  1:13 PM  Result Value Ref Range   TSH 0.932 0.450 - 4.500 uIU/mL   T4, Total 8.2 4.5 - 12.0 ug/dL   T3 Uptake Ratio 32 24 - 39 %   Free Thyroxine Index 2.6 1.2 - 4.9    Comment: (NOTE) Performed At: [facility] [place], [place] [id number] Rush Farmer MD VO:[id number]   Urinalysis, Complete     Status: Abnormal   Collection Time: 02/02/18 11:45 AM  Result Value Ref  Range   Specific Gravity, UA >1.030 (H) 1.005 - 1.030   pH, UA 6.0 5.0 - 7.5   Color, UA Yellow Yellow   Appearance Ur Cloudy (A) Clear   Leukocytes, UA 1+ (A) Negative   Protein, UA 3+ (A) Negative/Trace   Glucose, UA Negative Negative   Ketones, UA Negative Negative   RBC, UA 2+ (A) Negative   Bilirubin, UA Negative Negative   Urobilinogen, Ur 1.0 0.2 - 1.0 mg/dL   Nitrite, UA Negative Negative   Microscopic Examination See below:   Microscopic Examination     Status: Abnormal   Collection Time: 02/02/18 11:45 AM  Result Value Ref Range   WBC, UA 11-30 (A) 0 - 5 /hpf   RBC, UA 3-10 (A) 0 - 2 /hpf   Epithelial Cells (non renal) 0-10 0 - 10 /hpf   Mucus, UA Present (A) Not Estab.   Bacteria, UA Many (A) None seen/Few  CULTURE, URINE COMPREHENSIVE     Status: Abnormal   Collection Time: 02/02/18 12:16 PM  Result Value Ref Range   Urine Culture, Comprehensive Final report (A)    Organism ID, Bacteria Proteus mirabilis (A)     Comment: 25,000-50,000 colony forming units per mL Cefazolin <=4 ug/mL Cefazolin with an MIC <=16 predicts susceptibility to the oral agents cefaclor, cefdinir, cefpodoxime, cefprozil, cefuroxime, cephalexin, and loracarbef when used for therapy of uncomplicated urinary tract infections due to E. coli, Klebsiella pneumoniae, and Proteus mirabilis.    ANTIMICROBIAL SUSCEPTIBILITY Comment     Comment:       ** S = Susceptible; I = Intermediate; R = Resistant **                    P = Positive; N = Negative             MICS are expressed in micrograms per mL    Antibiotic                 RSLT#1    RSLT#2    RSLT#3    RSLT#4 Amoxicillin/Clavulanic Acid    S Ampicillin                     S Cefepime                       S Ceftriaxone                    S Cefuroxime                     S Ciprofloxacin                  S Ertapenem                      S Gentamicin                     S Levofloxacin                   S Meropenem                       S Nitrofurantoin                 R Piperacillin/Tazobactam        S Tetracycline  R Tobramycin                     S Trimethoprim/Sulfa             S      PHQ2/9: Depression screen [facility] 2/9 11/24/2017 10/11/2017 08/16/2017 08/08/2017 04/24/2017  Decreased Interest 0 0 0 0 0  Down, Depressed, Hopeless 0 0 0 0 0  PHQ - 2 Score 0 0 0 0 0  Altered sleeping 3 0 - 0 -  Tired, decreased energy 3 0 - 0 -  Change in appetite 1 0 - 0 -  Feeling bad or failure about yourself  0 0 - 0 -  Trouble concentrating 0 0 - 0 -  Moving slowly or fidgety/restless 0 0 - 0 -  Suicidal thoughts 0 0 - 0 -  PHQ-9 Score 7 0 - 0 -  Difficult doing work/chores Not difficult at all Not difficult at all - Not difficult at all -    Fall Risk: Fall Risk  11/24/2017 10/11/2017 08/16/2017 08/08/2017 07/24/2017  Falls in the past year? _0   Number falls in past yr: - - - - -  Injury with Fall? - - - - -  Risk for fall due to : - - - Impaired vision;Other (Comment);Medication side effect;Impaired mobility -  Risk for fall due to: Comment - - - wears eyeglasses; use of O2, weakness, dyspnea -      Assessment & Plan  1. Chronic obstructive pulmonary disease, unspecified COPD type ([place])  Under the care of Dr. Orland Dec, on 2 liters of oxygen   2. Atrial fibrillation/flutter ([place])  Not on aspirin because of anemia  3. Cancer, bladder, neck ([place])  Seeing Urologist and oncologist   4. Mass of right lung  Seen by hematologist treated back in 2017  5. Rheumatoid arthritis of wrist, unspecified laterality, unspecified rheumatoid factor presence (HCC)  On medication, sees Dr. Jefm Bryant every 6 months , pain on joints in under control   6. Hemiparesis affecting left side as late effect of cerebrovascular accident (CVA) ([place])  He used to take statin therapy but he stopped because of weird dreams, he does not want medication   7. Moderate protein-calorie malnutrition (HCC)  - megestrol  (MEGACE) 20 MG tablet; Take 1 tablet (20 mg total) by mouth 2 (two) times daily.  Dispense: 60 tablet; Refill: 0  8. Other insomnia  - QUEtiapine (SEROQUEL) 25 MG tablet; Take 1 tablet (25 mg total) by mouth at bedtime.  Dispense: 30 tablet; Refill: 0   Discussed importance of taking/bringing medications to all doctor visits

## 2018-02-08 NOTE — Telephone Encounter (Signed)
Left vm to call back   McGowan, Hunt Oris, PA-C  [facility]        Please let Mr. Copeman know that his urine culture was positive for infection. He needs to start Septra DS, twice daily for seven days.

## 2018-02-08 NOTE — Telephone Encounter (Signed)
When sending rx for septra interaction flagged as a high interaction as patient is currently taking methotrexate. Consulted with Dr.Stoioff who states that Ceftin would be a more appropriate as not to cause any risks to the patient. RX sent in.

## 2018-02-08 NOTE — Telephone Encounter (Signed)
Pt called office and I read message from [facility]

## 2018-02-08 NOTE — Progress Notes (Signed)
See telephone encounter 02/08/2018

## 2018-02-15 ENCOUNTER — Ambulatory Visit: Payer: Medicare HMO

## 2018-02-15 ENCOUNTER — Ambulatory Visit: Payer: Medicare HMO | Admitting: Nurse Practitioner

## 2018-02-15 ENCOUNTER — Other Ambulatory Visit: Payer: Medicare HMO

## 2018-02-16 NOTE — Progress Notes (Signed)
[place]  [phone] [phone]  ID: [name]: 1935-03-30  MR#: [id number]  QQP#:[id number]  Patient Care Team: [name], MD as PCP - General (Family Medicine) [name], MD as Referring Physician (Ophthalmology) [name], MD as Consulting Physician (Rheumatology) [name], MD as Referring Physician (Specialist) [name], MD as Consulting Physician (Urology) [name], MD as Referring Physician (Radiation Oncology)  CHIEF COMPLAINT: Locally advanced high-grade urothelial carcinoma.    INTERVAL HISTORY: Patient returns to clinic today for further evaluation and consideration of cycle 5 of Tecentriq.  He continues to tolerate his treatments well without significant side effects.  He currently feels well and is asymptomatic. He has no neurologic complaints.  He denies any recent fevers or illnesses.  He has chronic shortness of breath, but denies any chest pain, hemoptysis, or cough.  He has no nausea, vomiting, constipation, or diarrhea.  He denies any hematuria or other urologic complaints.  Patient offers no specific complaints today.  REVIEW OF SYSTEMS:   Review of Systems  Constitutional: Negative.  Negative for fever, malaise/fatigue and weight loss.  Respiratory: Negative.  Negative for cough, hemoptysis and shortness of breath.   Cardiovascular: Negative.  Negative for chest pain and leg swelling.  Gastrointestinal: Negative.  Negative for abdominal pain, blood in stool and melena.  Genitourinary: Negative.  Negative for dysuria and hematuria.  Musculoskeletal: Negative.  Negative for back pain.  Skin: Negative.  Negative for rash.  Neurological: Negative.  Negative for sensory change, focal weakness, weakness and headaches.  Psychiatric/Behavioral: Negative.  The patient is not nervous/anxious.     As per HPI. Otherwise, a complete review of systems is negative.  PAST  MEDICAL HISTORY: Past Medical History:  Diagnosis Date  . Arthritis   . Collagen vascular disease (HCC)    rheumatoid arthritis  . COPD (chronic obstructive pulmonary disease) ([place])   . GERD (gastroesophageal reflux disease)   . History of prostate cancer   . HLD (hyperlipidemia)   . HOH (hard of hearing)   . Insomnia   . Lung cancer ([place]) 2017   rad tx's.  . Oxygen deficit    2L HS AND PRN  . Prostate cancer ([place]) 2006   Rad seed tx's.  . Stroke ([place])    2000    PAST SURGICAL HISTORY: Past Surgical History:  Procedure Laterality Date  . APPENDECTOMY  1965  . CATARACT EXTRACTION W/PHACO Right 01/05/2016   Procedure: CATARACT EXTRACTION PHACO AND INTRAOCULAR LENS PLACEMENT (IOC);  Surgeon: [name], MD;  Location: [facility] ORS;  Service: Ophthalmology;  Laterality: Right;  [place] 1.14 AP% 18.1 CDE 13.44 FLUID PACK LOT # [id number] H  . CYSTOSCOPY W/ RETROGRADES Bilateral 11/01/2017   Procedure: CYSTOSCOPY WITH RETROGRADE PYELOGRAM;  Surgeon: Hollice Espy, MD;  Location: [facility] ORS;  Service: Urology;  Laterality: Bilateral;  . ENDOBRONCHIAL ULTRASOUND N/A 02/26/2015   Procedure: ENDOBRONCHIAL ULTRASOUND;  Surgeon: Flora Lipps, MD;  Location: [facility] ORS;  Service: Cardiopulmonary;  Laterality: N/A;  . [place]  . INSERTION PROSTATE RADIATION SEED  2002  . TRANSURETHRAL RESECTION OF PROSTATE N/A 11/01/2017   Procedure: TRANSURETHRAL RESECTION OF THE PROSTATE (TURP) (channel TURP);  Surgeon: Hollice Espy, MD;  Location: [facility] ORS;  Service: Urology;  Laterality: N/A;    FAMILY HISTORY: Family History  Problem Relation Age of Onset  . Cancer Mother   . Cancer Father        Lung Cancer  . Cancer Sister  breast  . Cancer Brother        stomach    ADVANCED DIRECTIVES (Y/N):  N  HEALTH MAINTENANCE: Social History   Tobacco Use  . Smoking status: Former Smoker    Packs/day: 0.50    Years: 50.00    Pack years: 25.00    Types: Cigarettes    Last attempt  to quit: 02/21/2013    Years since quitting: 5.0  . Smokeless tobacco: Never Used  . Tobacco comment: smoking cessation materials not required  Substance Use Topics  . Alcohol use: No    Alcohol/week: 0.0 standard drinks  . Drug use: No     Colonoscopy:  PAP:  Bone density:  Lipid panel:  Allergies  Allergen Reactions  . Plaquenil [Hydroxychloroquine]     Bad dreams  . Simvastatin Other (See Comments)    Bad dreams    Current Outpatient Medications  Medication Sig Dispense Refill  . cefUROXime (CEFTIN) 250 MG tablet Take 1 tablet (250 mg total) by mouth 2 (two) times daily with a meal. 14 tablet 0  . finasteride (PROSCAR) 5 MG tablet Take 5 mg by mouth daily.  11  . Fluticasone-Umeclidin-Vilant (TRELEGY ELLIPTA) 100-62.5-25 MCG/INH AEPB Inhale 1 puff into the lungs daily. 60 each 5  . folic acid (FOLVITE) 1 MG tablet Take 1 tablet (1 mg total) by mouth daily. 30 tablet 5  . megestrol (MEGACE) 20 MG tablet Take 1 tablet (20 mg total) by mouth 2 (two) times daily. 60 tablet 0  . methotrexate (RHEUMATREX) 2.5 MG tablet Take 5 tablets by mouth once a week.    . OXYGEN Place 2 L into the nose daily as needed.     . prochlorperazine (COMPAZINE) 10 MG tablet Take 1 tablet (10 mg total) by mouth every 6 (six) hours as needed (Nausea or vomiting). 60 tablet 2  . QUEtiapine (SEROQUEL) 25 MG tablet Take 1 tablet (25 mg total) by mouth at bedtime. 30 tablet 0  . tamsulosin (FLOMAX) 0.4 MG CAPS capsule Take 1 capsule (0.4 mg total) by mouth daily. (Patient taking differently: Take 0.8 mg by mouth daily. ) 30 capsule 11   No current facility-administered medications for this visit.     OBJECTIVE: Vitals:   02/22/18 1120  BP: 112/64  Pulse: 96  Temp: 97.6 F (36.4 C)     Body mass index is 17.79 kg/m.    ECOG FS:0 - Asymptomatic  General: Well-developed, well-nourished, no acute distress. Eyes: Pink conjunctiva, anicteric sclera. HEENT: Normocephalic, moist mucous membranes. Lungs:  Clear to auscultation bilaterally. Heart: Regular rate and rhythm. No rubs, murmurs, or gallops. Abdomen: Soft, nontender, nondistended. No organomegaly noted, normoactive bowel sounds. Musculoskeletal: No edema, cyanosis, or clubbing. Neuro: Alert, answering all questions appropriately. Cranial nerves grossly intact. Skin: No rashes or petechiae noted. Psych: Normal affect.  LAB RESULTS:  Lab Results  Component Value Date   NA 138 02/22/2018   K 3.4 (L) 02/22/2018   CL 105 02/22/2018   CO2 24 02/22/2018   GLUCOSE 105 (H) 02/22/2018   BUN 26 (H) 02/22/2018   CREATININE 1.12 02/22/2018   CALCIUM 9.1 02/22/2018   PROT 9.1 (H) 02/22/2018   ALBUMIN 2.5 (L) 02/22/2018   AST 22 02/22/2018   ALT 14 02/22/2018   ALKPHOS 59 02/22/2018   BILITOT 0.4 02/22/2018   GFRNONAA >60 02/22/2018   GFRAA >60 02/22/2018    Lab Results  Component Value Date   WBC 5.5 02/22/2018   NEUTROABS 4.3 02/22/2018  HGB 9.6 (L) 02/22/2018   HCT 31.9 (L) 02/22/2018   MCV 93.0 02/22/2018   PLT 242 02/22/2018     STUDIES: No results found.  ASSESSMENT: Locally advanced high-grade urothelial carcinoma.  PLAN:    1. Locally advanced high-grade urothelial carcinoma: Case discussed with urology, radiation oncology, as well as multidisciplinary tumor board.  Patient is not a surgical candidate nor can he receive additional XRT given his history of prostate cancer and brachii therapy seed placement.  Though he has a decent performance status, patient is frail and likely would not tolerate cisplatin based therapy.  PET scan results from November 22, 2017 reviewed independently with no obvious metastatic disease.  Because of this, patient will likely need periodic cystoscopies to evaluate for therapy response.  Proceed with cycle 5 of Tecentriq next week secondary to insurance delays.  Patient will then return to clinic in 4 weeks for Tecentriq only and then in 7 weeks for further evaluation and consideration of  cycle 7.  2.  Right upper lobe lung mass: PET scan results as above.  Previously although biopsy was negative, patient had a positive PET scan that was highly suspicious for underlying malignancy. He underwent SBRT in approximately May 2017.   3.  Shortness of breath: Patient does not complain of this today.  Chronic and unchanged. 4.  Anemia: Patient's hemoglobin is slowly trending down at 9.6.  Monitor. 5.  Hematuria: Patient does not complain of this today.  Continue follow-up with urology as indicated. 6.  Hypokalemia: Mild, monitor.  Patient expressed understanding and was in agreement with this plan. He also understands that He can call clinic at any time with any questions, concerns, or complaints.    Lloyd Huger, MD   02/23/2018 9:03 AM

## 2018-02-21 ENCOUNTER — Other Ambulatory Visit: Payer: Self-pay | Admitting: Oncology

## 2018-02-22 ENCOUNTER — Inpatient Hospital Stay: Payer: Medicare Other

## 2018-02-22 ENCOUNTER — Other Ambulatory Visit: Payer: Self-pay

## 2018-02-22 ENCOUNTER — Inpatient Hospital Stay: Payer: Medicare Other | Attending: Oncology

## 2018-02-22 ENCOUNTER — Inpatient Hospital Stay (HOSPITAL_BASED_OUTPATIENT_CLINIC_OR_DEPARTMENT_OTHER): Payer: Medicare Other | Admitting: Oncology

## 2018-02-22 VITALS — BP 112/64 | HR 96 | Temp 97.6°F | Ht 68.0 in | Wt 117.0 lb

## 2018-02-22 DIAGNOSIS — Z801 Family history of malignant neoplasm of trachea, bronchus and lung: Secondary | ICD-10-CM | POA: Insufficient documentation

## 2018-02-22 DIAGNOSIS — E785 Hyperlipidemia, unspecified: Secondary | ICD-10-CM | POA: Insufficient documentation

## 2018-02-22 DIAGNOSIS — Z803 Family history of malignant neoplasm of breast: Secondary | ICD-10-CM

## 2018-02-22 DIAGNOSIS — Z5111 Encounter for antineoplastic chemotherapy: Secondary | ICD-10-CM | POA: Diagnosis not present

## 2018-02-22 DIAGNOSIS — Z79899 Other long term (current) drug therapy: Secondary | ICD-10-CM | POA: Insufficient documentation

## 2018-02-22 DIAGNOSIS — Z923 Personal history of irradiation: Secondary | ICD-10-CM

## 2018-02-22 DIAGNOSIS — J449 Chronic obstructive pulmonary disease, unspecified: Secondary | ICD-10-CM | POA: Insufficient documentation

## 2018-02-22 DIAGNOSIS — Z8546 Personal history of malignant neoplasm of prostate: Secondary | ICD-10-CM | POA: Diagnosis not present

## 2018-02-22 DIAGNOSIS — K219 Gastro-esophageal reflux disease without esophagitis: Secondary | ICD-10-CM | POA: Diagnosis not present

## 2018-02-22 DIAGNOSIS — Z85118 Personal history of other malignant neoplasm of bronchus and lung: Secondary | ICD-10-CM | POA: Insufficient documentation

## 2018-02-22 DIAGNOSIS — E876 Hypokalemia: Secondary | ICD-10-CM | POA: Insufficient documentation

## 2018-02-22 DIAGNOSIS — C679 Malignant neoplasm of bladder, unspecified: Secondary | ICD-10-CM | POA: Insufficient documentation

## 2018-02-22 DIAGNOSIS — E786 Lipoprotein deficiency: Secondary | ICD-10-CM | POA: Diagnosis not present

## 2018-02-22 DIAGNOSIS — Z9981 Dependence on supplemental oxygen: Secondary | ICD-10-CM | POA: Insufficient documentation

## 2018-02-22 DIAGNOSIS — Z8673 Personal history of transient ischemic attack (TIA), and cerebral infarction without residual deficits: Secondary | ICD-10-CM

## 2018-02-22 DIAGNOSIS — D649 Anemia, unspecified: Secondary | ICD-10-CM

## 2018-02-22 DIAGNOSIS — Z800 Family history of malignant neoplasm of digestive organs: Secondary | ICD-10-CM | POA: Diagnosis not present

## 2018-02-22 LAB — COMPREHENSIVE METABOLIC PANEL
ALK PHOS: 59 U/L (ref 38–126)
ALT: 14 U/L (ref 0–44)
AST: 22 U/L (ref 15–41)
Albumin: 2.5 g/dL — ABNORMAL LOW (ref 3.5–5.0)
Anion gap: 9 (ref 5–15)
BUN: 26 mg/dL — ABNORMAL HIGH (ref 8–23)
CALCIUM: 9.1 mg/dL (ref 8.9–10.3)
CO2: 24 mmol/L (ref 22–32)
Chloride: 105 mmol/L (ref 98–111)
Creatinine, Ser: 1.12 mg/dL (ref 0.61–1.24)
GFR calc Af Amer: >60 mL/min (ref >60)
GFR calc non Af Amer: >60 mL/min (ref >60)
Glucose, Bld: 105 mg/dL — ABNORMAL HIGH (ref 70–99)
Potassium: 3.4 mmol/L — ABNORMAL LOW (ref 3.5–5.1)
Sodium: 138 mmol/L (ref 135–145)
TOTAL PROTEIN: 9.1 g/dL — AB (ref 6.5–8.1)
Total Bilirubin: 0.4 mg/dL (ref 0.3–1.2)

## 2018-02-22 LAB — CBC WITH DIFFERENTIAL/PLATELET
Abs Immature Granulocytes: 0.02 10*3/uL (ref 0.00–0.07)
BASOS PCT: 0 %
Basophils Absolute: 0 10*3/uL (ref 0.0–0.1)
Eosinophils Absolute: 0.1 10*3/uL (ref 0.0–0.5)
Eosinophils Relative: 2 %
HCT: 31.9 % — ABNORMAL LOW (ref 39.0–52.0)
Hemoglobin: 9.6 g/dL — ABNORMAL LOW (ref 13.0–17.0)
Immature Granulocytes: 0 %
Lymphocytes Relative: 16 %
Lymphs Abs: 0.9 10*3/uL (ref 0.7–4.0)
MCH: 28 pg (ref 26.0–34.0)
MCHC: 30.1 g/dL (ref 30.0–36.0)
MCV: 93 fL (ref 80.0–100.0)
Monocytes Absolute: 0.2 10*3/uL (ref 0.1–1.0)
Monocytes Relative: 4 %
Neutro Abs: 4.3 10*3/uL (ref 1.7–7.7)
Neutrophils Relative %: 78 %
PLATELETS: 242 10*3/uL (ref 150–400)
RBC: 3.43 MIL/uL — ABNORMAL LOW (ref 4.22–5.81)
RDW: 18 % — AB (ref 11.5–15.5)
WBC: 5.5 10*3/uL (ref 4.0–10.5)
nRBC: 0 % (ref 0.0–0.2)

## 2018-02-22 NOTE — Progress Notes (Signed)
Patient is here today to follow up on his urothelial carcinoma of bladder. Patient stated that he had been doing well with no complaints.

## 2018-02-23 LAB — THYROID PANEL WITH TSH
Free Thyroxine Index: 2.3 (ref 1.2–4.9)
T3 Uptake Ratio: 30 % (ref 24–39)
T4, Total: 7.5 ug/dL (ref 4.5–12.0)
TSH: 1.71 u[IU]/mL (ref 0.450–4.500)

## 2018-02-28 ENCOUNTER — Inpatient Hospital Stay: Payer: Medicare Other

## 2018-02-28 VITALS — BP 108/62 | HR 87 | Temp 96.0°F | Resp 20 | Wt 110.4 lb

## 2018-02-28 DIAGNOSIS — K219 Gastro-esophageal reflux disease without esophagitis: Secondary | ICD-10-CM | POA: Diagnosis not present

## 2018-02-28 DIAGNOSIS — Z803 Family history of malignant neoplasm of breast: Secondary | ICD-10-CM | POA: Diagnosis not present

## 2018-02-28 DIAGNOSIS — Z8673 Personal history of transient ischemic attack (TIA), and cerebral infarction without residual deficits: Secondary | ICD-10-CM | POA: Diagnosis not present

## 2018-02-28 DIAGNOSIS — Z801 Family history of malignant neoplasm of trachea, bronchus and lung: Secondary | ICD-10-CM | POA: Diagnosis not present

## 2018-02-28 DIAGNOSIS — D649 Anemia, unspecified: Secondary | ICD-10-CM | POA: Diagnosis not present

## 2018-02-28 DIAGNOSIS — Z5111 Encounter for antineoplastic chemotherapy: Secondary | ICD-10-CM | POA: Diagnosis not present

## 2018-02-28 DIAGNOSIS — Z85118 Personal history of other malignant neoplasm of bronchus and lung: Secondary | ICD-10-CM | POA: Diagnosis not present

## 2018-02-28 DIAGNOSIS — C679 Malignant neoplasm of bladder, unspecified: Secondary | ICD-10-CM | POA: Diagnosis not present

## 2018-02-28 DIAGNOSIS — E785 Hyperlipidemia, unspecified: Secondary | ICD-10-CM | POA: Diagnosis not present

## 2018-02-28 DIAGNOSIS — J449 Chronic obstructive pulmonary disease, unspecified: Secondary | ICD-10-CM | POA: Diagnosis not present

## 2018-02-28 DIAGNOSIS — Z79899 Other long term (current) drug therapy: Secondary | ICD-10-CM | POA: Diagnosis not present

## 2018-02-28 DIAGNOSIS — Z800 Family history of malignant neoplasm of digestive organs: Secondary | ICD-10-CM | POA: Diagnosis not present

## 2018-02-28 DIAGNOSIS — Z9981 Dependence on supplemental oxygen: Secondary | ICD-10-CM | POA: Diagnosis not present

## 2018-02-28 DIAGNOSIS — Z923 Personal history of irradiation: Secondary | ICD-10-CM | POA: Diagnosis not present

## 2018-02-28 DIAGNOSIS — E876 Hypokalemia: Secondary | ICD-10-CM | POA: Diagnosis not present

## 2018-02-28 MED ORDER — SODIUM CHLORIDE 0.9 % IV SOLN
1200.0000 mg | Freq: Once | INTRAVENOUS | Status: AC
  Administered 2018-02-28: 1200 mg via INTRAVENOUS
  Filled 2018-02-28: qty 20

## 2018-02-28 MED ORDER — SODIUM CHLORIDE 0.9 % IV SOLN
Freq: Once | INTRAVENOUS | Status: AC
  Administered 2018-02-28: 11:00:00 via INTRAVENOUS
  Filled 2018-02-28: qty 250

## 2018-02-28 MED ORDER — HEPARIN SOD (PORK) LOCK FLUSH 100 UNIT/ML IV SOLN
500.0000 [IU] | Freq: Once | INTRAVENOUS | Status: DC | PRN
  Filled 2018-02-28: qty 5

## 2018-03-02 ENCOUNTER — Other Ambulatory Visit: Payer: Self-pay | Admitting: Family Medicine

## 2018-03-02 DIAGNOSIS — G4709 Other insomnia: Secondary | ICD-10-CM

## 2018-03-02 DIAGNOSIS — E440 Moderate protein-calorie malnutrition: Secondary | ICD-10-CM

## 2018-03-05 ENCOUNTER — Ambulatory Visit (INDEPENDENT_AMBULATORY_CARE_PROVIDER_SITE_OTHER): Payer: Medicare Other | Admitting: Family Medicine

## 2018-03-05 DIAGNOSIS — C675 Malignant neoplasm of bladder neck: Secondary | ICD-10-CM | POA: Diagnosis not present

## 2018-03-05 NOTE — Progress Notes (Signed)
Cath Change/ Replacement  Patient is present today for a catheter change due to urinary retention.  56ml of water was removed from the balloon, a 16FR foley cath was removed with out difficulty.  Patient was cleaned and prepped in a sterile fashion with betadine and 2% lidocaine jelly was instilled into the urethra. A 16 FR foley cath was replaced into the bladder no complications were noted Urine return was noted 27ml and urine was clear in color. The balloon was filled with 41ml of sterile water. A leg bag was attached for drainage.    Preformed by: Elberta Leatherwood, CMA  Follow up: 1 month

## 2018-03-07 ENCOUNTER — Ambulatory Visit: Payer: Medicare HMO | Admitting: Urology

## 2018-03-08 ENCOUNTER — Encounter: Payer: Self-pay | Admitting: Urology

## 2018-03-08 ENCOUNTER — Ambulatory Visit: Payer: Medicare Other

## 2018-03-08 DIAGNOSIS — J449 Chronic obstructive pulmonary disease, unspecified: Secondary | ICD-10-CM | POA: Diagnosis not present

## 2018-03-14 ENCOUNTER — Ambulatory Visit (INDEPENDENT_AMBULATORY_CARE_PROVIDER_SITE_OTHER): Payer: Medicare Other | Admitting: Urology

## 2018-03-14 ENCOUNTER — Encounter: Payer: Self-pay | Admitting: Urology

## 2018-03-14 DIAGNOSIS — C675 Malignant neoplasm of bladder neck: Secondary | ICD-10-CM

## 2018-03-14 DIAGNOSIS — N321 Vesicointestinal fistula: Secondary | ICD-10-CM

## 2018-03-14 NOTE — Progress Notes (Signed)
03/14/2018 4:10 PM   Collin Gonzales 1935/09/08 [id number]  Referring provider: [name], MD [address] [place], [place] [address]  Chief Complaint  Patient presents with  . Urinary Retention    HPI: Patient is a 83 year old Afro-American male with a high grade invasive urothelial carcinoma involving the prostate, a history of prostate cancer and recurrent urinary tract infections who presents today for a Foley exchange.   Background history 83 yo M followed by urology for recurrent UTIs and urinary retention as well as a personal history of prostate cancer status post brachytherapy in 2006 who recently underwent TURP due to suspicious prostatic findings on cystoscopy after difficult Foley insertion. Unfortunately, surgical pathology shows high grave invasive urothelial carcinoma involving the prostate with squamous differentiation, focal keratinization and extensive necrosis.  Perineural invasion was present. Postoperatively, he was able to void spontaneously until earlier this week when he could no longer urinate and required Foley catheter replacement. Normal LFTs.  Normal alk phos.  No recent cross-sectional abdominal or pelvic imaging.   Chest CT 3 months ago shows interval increased size of posterior right upper pole "band like" nodule measuring 1.4 x 2.3 which is felt to be most compatible with a benign process with multiple other nodules and scarring along with emphysematous changes. He has lost 25 lbs over the past 4 years without trying.  He does live alone and does most of his own ADLs.   He has multiple medical comorbidities.  He has a personal history of COPD on 02, lung cancer, and prostate cancer as well as history of stroke amongst others. Contrast CT on 11/14/2017 revealed bladder is thick-walled although underdistended/decompressed by indwelling Foley catheter in this patient with known bladder cancer.  Brachytherapy seeds in the prostate.  No  findings specific for metastatic disease. PET scan on 11/22/2017 revealed no evidence of local bladder recurrence or metastasis in the pelvis.  No evidence distant metastatic disease. He currently undergoing treatment with Tecentriq   He had his catheter exchanged on 03/05/2018 and returned today for a clogged catheter.  Nursing staff exchanged the catheter, but they discovered findings that were concerning for a possible colovesicular fistula.  Urine had a very foul order, there was stool in the leg bag and air was expelled when placing the catheter.  It did irrigate clear.   Collin Gonzales denies any gross hematuria or suprapubic/flank pain.  Patient denies any fevers, chills, nausea or vomiting.   PMH: Past Medical History:  Diagnosis Date  . Arthritis   . Collagen vascular disease (HCC)    rheumatoid arthritis  . COPD (chronic obstructive pulmonary disease) ([place])   . GERD (gastroesophageal reflux disease)   . History of prostate cancer   . HLD (hyperlipidemia)   . HOH (hard of hearing)   . Insomnia   . Lung cancer ([place]) 2017   rad tx's.  . Oxygen deficit    2L HS AND PRN  . Prostate cancer ([place]) 2006   Rad seed tx's.  . Stroke [facility])    2000    Surgical History: Past Surgical History:  Procedure Laterality Date  . APPENDECTOMY  1965  . CATARACT EXTRACTION W/PHACO Right 01/05/2016   Procedure: CATARACT EXTRACTION PHACO AND INTRAOCULAR LENS PLACEMENT (IOC);  Surgeon: [name], MD;  Location: [facility] ORS;  Service: Ophthalmology;  Laterality: Right;  [place] 1.14 AP% 18.1 CDE 13.44 FLUID PACK LOT # [id number] H  . CYSTOSCOPY W/ RETROGRADES Bilateral 11/01/2017   Procedure: CYSTOSCOPY WITH RETROGRADE PYELOGRAM;  Surgeon: Hollice Espy, MD;  Location: [facility] ORS;  Service: Urology;  Laterality: Bilateral;  . ENDOBRONCHIAL ULTRASOUND N/A 02/26/2015   Procedure: ENDOBRONCHIAL ULTRASOUND;  Surgeon: Flora Lipps, MD;  Location: [facility] ORS;  Service: Cardiopulmonary;  Laterality: N/A;  .  [place]  . INSERTION PROSTATE RADIATION SEED  2002  . TRANSURETHRAL RESECTION OF PROSTATE N/A 11/01/2017   Procedure: TRANSURETHRAL RESECTION OF THE PROSTATE (TURP) (channel TURP);  Surgeon: Hollice Espy, MD;  Location: [facility] ORS;  Service: Urology;  Laterality: N/A;    Home Medications:  Allergies as of 03/14/2018      Reactions   Plaquenil [hydroxychloroquine]    Bad dreams   Simvastatin Other (See Comments)   Bad dreams      Medication List       Accurate as of March 14, 2018  4:10 PM. Always use your most recent med list.        cefUROXime 250 MG tablet Commonly known as:  CEFTIN Take 1 tablet (250 mg total) by mouth 2 (two) times daily with a meal.   finasteride 5 MG tablet Commonly known as:  PROSCAR Take 5 mg by mouth daily.   Fluticasone-Umeclidin-Vilant 100-62.5-25 MCG/INH Aepb Commonly known as:  TRELEGY ELLIPTA Inhale 1 puff into the lungs daily.   folic acid 1 MG tablet Commonly known as:  FOLVITE Take 1 tablet (1 mg total) by mouth daily.   megestrol 20 MG tablet Commonly known as:  MEGACE TAKE 1 TABLET BY MOUTH TWICE A DAY   methotrexate 2.5 MG tablet Commonly known as:  RHEUMATREX Take 5 tablets by mouth once a week.   OXYGEN Place 2 L into the nose daily as needed.   prochlorperazine 10 MG tablet Commonly known as:  COMPAZINE Take 1 tablet (10 mg total) by mouth every 6 (six) hours as needed (Nausea or vomiting).   QUEtiapine 25 MG tablet Commonly known as:  SEROQUEL TAKE 1 TABLET BY MOUTH EVERYDAY AT BEDTIME   tamsulosin 0.4 MG Caps capsule Commonly known as:  FLOMAX Take 1 capsule (0.4 mg total) by mouth daily.       Allergies:  Allergies  Allergen Reactions  . Plaquenil [Hydroxychloroquine]     Bad dreams  . Simvastatin Other (See Comments)    Bad dreams    Family History: Family History  Problem Relation Age of Onset  . Cancer Mother   . Cancer Father        Lung Cancer  . Cancer Sister        breast  .  Cancer Brother        stomach    Social History:  reports that he quit smoking about 5 years ago. His smoking use included cigarettes. He has a 25.00 pack-year smoking history. He has never used smokeless tobacco. He reports that he does not drink alcohol or use drugs.  Physical Exam: Constitutional:  Elderly.  Cachetic.  Alert.  No acute distress. Cardiovascular: No clubbing, cyanosis, or edema. Respiratory: Normal respiratory effort, no increased work of breathing. GI: Abdomen is soft, non tender and non distended.   GU: No CVA tenderness.  No bladder fullness or masses.  Patient with uncircumcised phallus. Foreskin easily retracted.  Foley is in place.  Scrotum without lesions, cysts, rashes and/or edema.  Bilateral hydroceles noted.  Testicles are located scrotally bilaterally. No masses are appreciated in the testicles. Left and right epididymis are normal. Skin: No rashes, bruises or suspicious lesions. Lymph: Inguinal adenopathy. Neurologic: Grossly intact, no  focal deficits, moving all 4 extremities. Psychiatric: Normal mood and affect.    Laboratory Data: Results for orders placed or performed in visit on 02/22/18  Comprehensive metabolic panel  Result Value Ref Range   Sodium 138 135 - 145 mmol/L   Potassium 3.4 (L) 3.5 - 5.1 mmol/L   Chloride 105 98 - 111 mmol/L   CO2 24 22 - 32 mmol/L   Glucose, Bld 105 (H) 70 - 99 mg/dL   BUN 26 (H) 8 - 23 mg/dL   Creatinine, Ser 1.12 0.61 - 1.24 mg/dL   Calcium 9.1 8.9 - 10.3 mg/dL   Total Protein 9.1 (H) 6.5 - 8.1 g/dL   Albumin 2.5 (L) 3.5 - 5.0 g/dL   AST 22 15 - 41 U/L   ALT 14 0 - 44 U/L   Alkaline Phosphatase 59 38 - 126 U/L   Total Bilirubin 0.4 0.3 - 1.2 mg/dL   GFR calc non Af Amer >60 >60 mL/min   GFR calc Af Amer >60 >60 mL/min   Anion gap 9 5 - 15  CBC with Differential  Result Value Ref Range   WBC 5.5 4.0 - 10.5 K/uL   RBC 3.43 (L) 4.22 - 5.81 MIL/uL   Hemoglobin 9.6 (L) 13.0 - 17.0 g/dL   HCT 31.9 (L) 39.0 -  52.0 %   MCV 93.0 80.0 - 100.0 fL   MCH 28.0 26.0 - 34.0 pg   MCHC 30.1 30.0 - 36.0 g/dL   RDW 18.0 (H) 11.5 - 15.5 %   Platelets 242 150 - 400 K/uL   nRBC 0.0 0.0 - 0.2 %   Neutrophils Relative % 78 %   Neutro Abs 4.3 1.7 - 7.7 K/uL   Lymphocytes Relative 16 %   Lymphs Abs 0.9 0.7 - 4.0 K/uL   Monocytes Relative 4 %   Monocytes Absolute 0.2 0.1 - 1.0 K/uL   Eosinophils Relative 2 %   Eosinophils Absolute 0.1 0.0 - 0.5 K/uL   Basophils Relative 0 %   Basophils Absolute 0.0 0.0 - 0.1 K/uL   Immature Granulocytes 0 %   Abs Immature Granulocytes 0.02 0.00 - 0.07 K/uL  Thyroid Panel With TSH  Result Value Ref Range   TSH 1.710 0.450 - 4.500 uIU/mL   T4, Total 7.5 4.5 - 12.0 ug/dL   T3 Uptake Ratio 30 24 - 39 %   Free Thyroxine Index 2.3 1.2 - 4.9   Lab Results  Component Value Date   WBC 5.5 02/22/2018   HGB 9.6 (L) 02/22/2018   HCT 31.9 (L) 02/22/2018   MCV 93.0 02/22/2018   PLT 242 02/22/2018    Lab Results  Component Value Date   CREATININE 1.12 02/22/2018   Urinalysis N/a I have reviewed the labs.    ROS     Pertinent Imaging CLINICAL DATA:  Subsequent treatment strategy for urothelial carcinoma of the bladder. Lung cancer diagnosed 2017. RIGHT upper lobe SP RT.  EXAM: NUCLEAR MEDICINE PET SKULL BASE TO THIGH  TECHNIQUE: 6.5 mCi F-18 FDG was injected intravenously. Full-ring PET imaging was performed from the skull base to thigh after the radiotracer. CT data was obtained and used for attenuation correction and anatomic localization.  Fasting blood glucose: 80 mg/dl  COMPARISON:  PET-CT 06/23/2015  FINDINGS: Mediastinal blood pool activity: SUV max 1.7  NECK: No hypermetabolic lymph nodes in the neck.  Incidental CT findings: none  CHEST: There is reticular scarring thickening in the RIGHT upper lobe at the previous focal hypermetabolic  nodule. There is mild metabolic activity with SUV max equal 2.3 decreased from 9.3 following  therapy. No discrete nodularity. Phase favored post radiation change.  There is severe centrilobular emphysema the upper lobes. There is bullous change in the RIGHT upper lobe.  No new pulmonary nodules.  Incidental CT findings: None  ABDOMEN/PELVIS:  No abnormal metabolic activity liver. No hypermetabolic abdominopelvic lymph nodes.  There is no evidence of local bladder cancer recurrence. Intense radiotracer activity within the bladder does limit evaluation of the bladder near bladder tissues.  Foley catheter in place.  No abnormal metabolic activity the prostate bed.  No hypermetabolic pelvic lymph nodes.  Incidental CT findings: none  SKELETON: No focal hypermetabolic activity to suggest skeletal metastasis.  Incidental CT findings: none  IMPRESSION: 1. No evidence of local bladder recurrence or metastasis in the pelvis. 2. No evidence distant metastatic disease. 3. The bladder itself is not well evaluated by FDG PET imaging. 4. Reticular scarring in the RIGHT upper lobe at SB RT site with mild metabolic activity is favored benign post radiation change. 5. Aortic Atherosclerosis (ICD10-I70.0) and Emphysema (ICD10-J43.9).   Electronically Signed   By: Suzy Bouchard M.D.   On: 11/22/2017 15:37  Procedure Cath Change/ Replacement Patient is present today for a catheter change due to urinary retention.  46m of water was removed from the balloon, a 16FR foley cath was removed with out difficulty.  Patient was cleaned and prepped in a sterile fashion with betadine and 2% lidocaine jelly was instilled into the urethra. A 18 FR foley cath was replaced into the bladder complications were noted as: bladder spasms which resulted in urine flowing around the catheter around the penis. Patient was covered in feces and was cleaned with hiclens.  Of note the urine in the bag that was removed was full with sediment and a brown substance which could be feces.  There was a very foul odor with the cath removal. Urine return was not noted however catheter irrigated clear on multiple attempts. The balloon was filled with 129mof sterile water. A leg bag was attached for drainage.  A night bag was also given to the patient and patient was given instruction on how to change from one bag to another. Patient was given proper instruction on catheter care.    Preformed by: OkGordy ClementCMA (AAMA) and SaFonnie JarvisCMA    Assessment & Plan:    1. Malignant neoplasm of urinary bladder neck (HCC)  Newly diagnosed high-grade invasive urothelial carcinoma involving the prostate with squamous differentiation CT Chest scheduled for 07/2018 Receiving Tecentriq  Will require periodic cystoscopies to assess for therapy response  2. Urinary retention Maintain Foley catheter Foley exchanged today - findings suspicious for a colo vesicular fistula during the cath exchange - pneumaturia and stool in leg bag  RTC in one month for next exchange   3. History of prostate cancer Recent PSA undetectable 08/2017 No evidence of disease   SHZara CouncilPA-C  [id number] Mechanic CourtSuOconeeuPembroke Pines[place] [phone]

## 2018-03-22 ENCOUNTER — Inpatient Hospital Stay: Payer: Medicare Other

## 2018-03-22 VITALS — BP 99/61 | HR 81 | Temp 96.6°F | Resp 16 | Wt 107.8 lb

## 2018-03-22 DIAGNOSIS — Z923 Personal history of irradiation: Secondary | ICD-10-CM | POA: Diagnosis not present

## 2018-03-22 DIAGNOSIS — Z800 Family history of malignant neoplasm of digestive organs: Secondary | ICD-10-CM | POA: Diagnosis not present

## 2018-03-22 DIAGNOSIS — K219 Gastro-esophageal reflux disease without esophagitis: Secondary | ICD-10-CM | POA: Diagnosis not present

## 2018-03-22 DIAGNOSIS — C679 Malignant neoplasm of bladder, unspecified: Secondary | ICD-10-CM

## 2018-03-22 DIAGNOSIS — J449 Chronic obstructive pulmonary disease, unspecified: Secondary | ICD-10-CM | POA: Diagnosis not present

## 2018-03-22 DIAGNOSIS — Z5111 Encounter for antineoplastic chemotherapy: Secondary | ICD-10-CM | POA: Diagnosis not present

## 2018-03-22 DIAGNOSIS — Z801 Family history of malignant neoplasm of trachea, bronchus and lung: Secondary | ICD-10-CM | POA: Diagnosis not present

## 2018-03-22 DIAGNOSIS — Z803 Family history of malignant neoplasm of breast: Secondary | ICD-10-CM | POA: Diagnosis not present

## 2018-03-22 DIAGNOSIS — Z9981 Dependence on supplemental oxygen: Secondary | ICD-10-CM | POA: Diagnosis not present

## 2018-03-22 DIAGNOSIS — Z8673 Personal history of transient ischemic attack (TIA), and cerebral infarction without residual deficits: Secondary | ICD-10-CM | POA: Diagnosis not present

## 2018-03-22 DIAGNOSIS — E876 Hypokalemia: Secondary | ICD-10-CM | POA: Diagnosis not present

## 2018-03-22 DIAGNOSIS — E785 Hyperlipidemia, unspecified: Secondary | ICD-10-CM | POA: Diagnosis not present

## 2018-03-22 DIAGNOSIS — Z85118 Personal history of other malignant neoplasm of bronchus and lung: Secondary | ICD-10-CM | POA: Diagnosis not present

## 2018-03-22 DIAGNOSIS — Z79899 Other long term (current) drug therapy: Secondary | ICD-10-CM | POA: Diagnosis not present

## 2018-03-22 DIAGNOSIS — D649 Anemia, unspecified: Secondary | ICD-10-CM | POA: Diagnosis not present

## 2018-03-22 MED ORDER — SODIUM CHLORIDE 0.9 % IV SOLN
1200.0000 mg | Freq: Once | INTRAVENOUS | Status: AC
  Administered 2018-03-22: 1200 mg via INTRAVENOUS
  Filled 2018-03-22: qty 20

## 2018-03-22 MED ORDER — SODIUM CHLORIDE 0.9 % IV SOLN
Freq: Once | INTRAVENOUS | Status: AC
  Administered 2018-03-22: 14:00:00 via INTRAVENOUS
  Filled 2018-03-22: qty 250

## 2018-03-22 NOTE — Progress Notes (Signed)
Per Tillie Rung, Dr. [name] states ok to proceed with treatment despite last lab check being 1/2.

## 2018-03-26 ENCOUNTER — Ambulatory Visit
Admission: RE | Admit: 2018-03-26 | Discharge: 2018-03-26 | Disposition: A | Discharge status: Home / Self Care | Payer: Medicare Other | Source: Ambulatory Visit | Attending: Urology | Admitting: Urology

## 2018-03-26 ENCOUNTER — Ambulatory Visit (INDEPENDENT_AMBULATORY_CARE_PROVIDER_SITE_OTHER): Payer: Medicare Other | Admitting: Family Medicine

## 2018-03-26 ENCOUNTER — Other Ambulatory Visit: Payer: Self-pay | Admitting: Family Medicine

## 2018-03-26 ENCOUNTER — Telehealth: Payer: Self-pay | Admitting: Urology

## 2018-03-26 DIAGNOSIS — C675 Malignant neoplasm of bladder neck: Secondary | ICD-10-CM | POA: Diagnosis not present

## 2018-03-26 DIAGNOSIS — E440 Moderate protein-calorie malnutrition: Secondary | ICD-10-CM

## 2018-03-26 DIAGNOSIS — G4709 Other insomnia: Secondary | ICD-10-CM

## 2018-03-26 DIAGNOSIS — C680 Malignant neoplasm of urethra: Secondary | ICD-10-CM | POA: Diagnosis not present

## 2018-03-26 DIAGNOSIS — N321 Vesicointestinal fistula: Secondary | ICD-10-CM | POA: Diagnosis not present

## 2018-03-26 NOTE — Telephone Encounter (Signed)
Patient called the office today.  He is having problems with his catheter leaking and not draining in the bag.  Per Morey Hummingbird, I have added the patient to the nurse schedule for today.

## 2018-03-26 NOTE — Telephone Encounter (Signed)
Refill request for general medication: Seroquel 25 mg  Megace 20 mg  Last office visit: 02/08/2018  Last physical exam: 08/08/2017  Follow-ups on file. 03/30/2018

## 2018-03-26 NOTE — Progress Notes (Signed)
Patient presents today with complaints of his catheter leaking around the penis. He has had this complaint for a couple months with catheter changes not helping the problem. A CT scan was done today to determine where the issue is coming from. Dr. Erlene Quan reviewed the CT scan and he is to be here tomorrow for a office visit with Dr. Erlene Quan to discuss CT results and plan.

## 2018-03-27 ENCOUNTER — Ambulatory Visit (INDEPENDENT_AMBULATORY_CARE_PROVIDER_SITE_OTHER): Payer: Medicare Other | Admitting: Urology

## 2018-03-27 ENCOUNTER — Encounter: Payer: Self-pay | Admitting: Urology

## 2018-03-27 VITALS — BP 94/54 | HR 86 | Ht 66.0 in | Wt 108.0 lb

## 2018-03-27 DIAGNOSIS — C675 Malignant neoplasm of bladder neck: Secondary | ICD-10-CM

## 2018-03-27 DIAGNOSIS — N4289 Other specified disorders of prostate: Secondary | ICD-10-CM | POA: Diagnosis not present

## 2018-03-27 NOTE — Progress Notes (Signed)
03/27/2018  10:49 AM   [name] [birth year] [id number]  Referring provider: [name], MD [address] [place], [place] [address]  Chief Complaint  Patient presents with  . Results    CTscan    HPI: Collin Gonzales is a 83 y.o. Black or [place] American male that presents today to discuss management options following his recent CT scan results. He has a history of high-grade invasive urothelial carcinoma involving the prostate, prostate cancer and recurrent urinary tract infections.  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  Prostate Cancer S/p brachytherapy in 2006; TURP 10/2017 due to suspicious prostatic findings on cystoscopy after difficult Foley insertion.  Surgical pathology reveals high-grade invasive urothelial carcinoma involving the prostate with squamous differentiation, focal keratinization and extensive necrosis. Perineural invasion was present.  Chest CT 07/27/2017 shows interval increased size of posterior right upper pole "band like" nodule measuring 1.4 x 2.3 which is felt to be most compatible with a benign process with multiple other nodules and scarring along with emphysematous changes.  Contrast CT on 11/14/2017 revealed bladder is thick-walled although underdistended/decompressed by indwelling Foley catheter in this patient with known bladder cancer.  Brachytherapy seeds in the prostate.  No findings specific for metastatic disease.  PET scan on 11/22/2017 revealed no evidence of local bladder recurrence or metastasis in the pelvis.  No evidence distant metastatic disease. He currently undergoing treatment with Tecentriq.  CT Pelvis wo contrast (03/26/2018) reveals new circumfrential wall thickening in nondescended lower rectum, cannot exclude proctitis, low lying foley catheter with balloon dilated within TURP defect with tip within prostatic urethra near junction with  bladder, Nonspecific tiny focus of gas in the midline at the interface of the remnant prostate and rectum, difficult to exclude a rectal-prostatic fistula, very limited scan due to absence of enteric and IV contrast.  Today he presents with 2 family members.  He has had difficulty with his Foley catheter over the past few exchanges with non-drainage and leaking around the catheter.  On most recent Foley catheter insertion, air was noted as well as extreme foul-smelling material highly concerning for fistula.  He does note that he has seen prostate seeds in his stool.  He has been having loose stools and is struggling with constipation.  No fevers or chills.  No abdominal pain.  PMH: Past Medical History:  Diagnosis Date  . Arthritis   . Collagen vascular disease (HCC)    rheumatoid arthritis  . COPD (chronic obstructive pulmonary disease) ([place])   . GERD (gastroesophageal reflux disease)   . History of prostate cancer   . HLD (hyperlipidemia)   . HOH (hard of hearing)   . Insomnia   . Lung cancer ([place]) 2017   rad tx's.  . Oxygen deficit    2L HS AND PRN  . Prostate cancer ([place]) 2006   Rad seed tx's.  . Stroke [facility])    2000    Surgical History: Past Surgical History:  Procedure Laterality Date  . APPENDECTOMY  1965  . CATARACT EXTRACTION W/PHACO Right 01/05/2016   Procedure: CATARACT EXTRACTION PHACO AND INTRAOCULAR LENS PLACEMENT (IOC);  Surgeon: [name], MD;  Location: [facility] ORS;  Service: Ophthalmology;  Laterality: Right;  [place] 1.14 AP% 18.1 CDE 13.44 FLUID PACK LOT # [id number] H  . CYSTOSCOPY W/ RETROGRADES Bilateral 11/01/2017   Procedure: CYSTOSCOPY WITH RETROGRADE PYELOGRAM;  Surgeon: Hollice Espy, MD;  Location: [facility] ORS;  Service: Urology;  Laterality: Bilateral;  .  ENDOBRONCHIAL ULTRASOUND N/A 02/26/2015   Procedure: ENDOBRONCHIAL ULTRASOUND;  Surgeon: Flora Lipps, MD;  Location: [facility] ORS;  Service: Cardiopulmonary;  Laterality: N/A;  . [place]  .  INSERTION PROSTATE RADIATION SEED  2002  . TRANSURETHRAL RESECTION OF PROSTATE N/A 11/01/2017   Procedure: TRANSURETHRAL RESECTION OF THE PROSTATE (TURP) (channel TURP);  Surgeon: Hollice Espy, MD;  Location: [facility] ORS;  Service: Urology;  Laterality: N/A;   Home Medications:  Allergies as of 03/27/2018      Reactions   Plaquenil [hydroxychloroquine]    Bad dreams   Simvastatin Other (See Comments)   Bad dreams      Medication List       Accurate as of March 27, 2018 10:49 AM. Always use your most recent med list.        finasteride 5 MG tablet Commonly known as:  PROSCAR Take 5 mg by mouth daily.   Fluticasone-Umeclidin-Vilant 100-62.5-25 MCG/INH Aepb Commonly known as:  TRELEGY ELLIPTA Inhale 1 puff into the lungs daily.   folic acid 1 MG tablet Commonly known as:  FOLVITE Take 1 tablet (1 mg total) by mouth daily.   megestrol 20 MG tablet Commonly known as:  MEGACE TAKE 1 TABLET BY MOUTH TWICE A DAY   methotrexate 2.5 MG tablet Commonly known as:  RHEUMATREX Take 5 tablets by mouth once a week.   OXYGEN Place 2 L into the nose daily as needed.   prochlorperazine 10 MG tablet Commonly known as:  COMPAZINE Take 1 tablet (10 mg total) by mouth every 6 (six) hours as needed (Nausea or vomiting).   QUEtiapine 25 MG tablet Commonly known as:  SEROQUEL TAKE 1 TABLET BY MOUTH EVERYDAY AT BEDTIME   tamsulosin 0.4 MG Caps capsule Commonly known as:  FLOMAX Take 1 capsule (0.4 mg total) by mouth daily.      Allergies:  Allergies  Allergen Reactions  . Plaquenil [Hydroxychloroquine]     Bad dreams  . Simvastatin Other (See Comments)    Bad dreams   Family History: Family History  Problem Relation Age of Onset  . Cancer Mother   . Cancer Father        Lung Cancer  . Cancer Sister        breast  . Cancer Brother        stomach    Social History:  reports that he quit smoking about 5 years ago. His smoking use included cigarettes. He has a 25.00  pack-year smoking history. He has never used smokeless tobacco. He reports that he does not drink alcohol or use drugs.  ROS: UROLOGY Frequent Urination?: No Hard to postpone urination?: No Burning/pain with urination?: Yes Get up at night to urinate?: No Leakage of urine?: Yes Urine stream starts and stops?: No Trouble starting stream?: No Do you have to strain to urinate?: No Blood in urine?: No Urinary tract infection?: No Sexually transmitted disease?: No Injury to kidneys or bladder?: No Painful intercourse?: No Weak stream?: No Erection problems?: No Penile pain?: No  Gastrointestinal Nausea?: No Vomiting?: No Indigestion/heartburn?: No Diarrhea?: No Constipation?: No  Constitutional Fever: No Night sweats?: No Weight loss?: Yes Fatigue?: Yes  Skin Skin rash/lesions?: No Itching?: No  Eyes Blurred vision?: No Double vision?: No  Ears/Nose/Throat Sore throat?: No Sinus problems?: No  Hematologic/Lymphatic Swollen glands?: No Easy bruising?: No  Cardiovascular Leg swelling?: No Chest pain?: No  Respiratory Cough?: No Shortness of breath?: No  Endocrine Excessive thirst?: No  Musculoskeletal Back pain?:  No Joint pain?: No  Neurological Headaches?: No Dizziness?: No  Psychologic Depression?: No Anxiety?: No  Physical Exam: BP (!) 94/54   Pulse 86   Ht 5\' 6"  (1.676 m)   Wt 108 lb (49 kg)   BMI 17.43 kg/m   Constitutional:  Alert and oriented, No acute distress. Accompanied by two male family members. Respiratory: Normal respiratory effort, no increased work of breathing. Patient has an oxygen tank.  Foul smelling. Head: Normocephalic and Atraumatic. GU: No CVA tenderness Skin: No rashes, bruises or suspicious lesions. Neurologic: Grossly intact, no focal deficits, moving all 4 extremities. Psychiatric: Normal mood and affect.  Laboratory Data: Lab Results  Component Value Date   WBC 5.5 02/22/2018   HGB 9.6 (L) 02/22/2018     HCT 31.9 (L) 02/22/2018   MCV 93.0 02/22/2018   PLT 242 02/22/2018   Lab Results  Component Value Date   CREATININE 1.12 02/22/2018   Pertinent Imaging: CT Pelvis Wo Contrast  Result Date: 03/26/2018  CLINICAL DATA:  Urothelial carcinoma involving the prostate status post TURP 11/01/2017. Bladder outlet obstruction requiring Foley catheter placement. Stool in urine. Clinical concern for colovesical fistula. Remote history of prostate cancer status post brachytherapy.   EXAM: CT PELVIS WITHOUT CONTRAST   TECHNIQUE: Multidetector CT imaging of the pelvis was performed following the standard protocol without intravenous contrast.   COMPARISON:  11/22/2017 PET-CT.  11/14/2017 CT abdomen/pelvis.   FINDINGS:   Urinary Tract: Bladder is collapsed and poorly evaluated on this scan. Small amount of nonspecific gas is scattered within the collapsed bladder.   Bowel: Evaluation of the bowel is limited by absence of enteric and IV contrast. There is apparent new circumferential wall thickening in the nondistended lower rectum (series 2/image 39). Mild sigmoid diverticulosis, with no evidence of sigmoid colon wall thickening. No dilated or thick-walled pelvic small bowel loops. Moderate to large amount of stool throughout the visualized colon.   Vascular/Lymphatic: Atherosclerotic nonaneurysmal visualized abdominal aorta. Bilateral iliofemoral atherosclerosis. No pathologically enlarged lymph nodes in the pelvis.   Reproductive: Status post interval TURP. Foley catheter balloon is dilated within the TURP defect. Tip of Foley catheter is located within the prostatic urethra near the junction with the bladder. A few brachytherapy seeds are still visualized at the periphery of the remnant prostate. A tiny focus of gas is present at the interface of the remnant prostate and rectum in the midline (series 2/image 39), difficult to exclude a rectal-prostatic fistula.   Other: No focal fluid collections.   No pelvic ascites.   Musculoskeletal: No aggressive appearing focal osseous lesions. Marked lower lumbar spondylosis.   IMPRESSION:  1. Very limited scan due to the absence of enteric and IV contrast.  2. Foley catheter is low lying with the Foley balloon dilated within the TURP defect and with tip of Foley catheter within the prostatic urethra near the junction with the bladder. Bladder is decompressed and poorly evaluated on this scan.  3. Apparent new circumferential wall thickening in the nondistended lower rectum, cannot exclude proctitis.  4. Nonspecific tiny focus of gas in the midline at the interface of the remnant prostate and rectum, difficult to exclude a rectal-prostatic fistula. Consider further evaluation with CT pelvis with IV contrast after instillation of rectal contrast via a rectal catheter.  5. Moderate to large colonic stool volume, suggesting constipation.   Electronically Signed By: Ilona Sorrel M.D.   On: 03/26/2018 16:44   I have personally reviewed all the images today.  Foley is  malpositioned in the prostate, constipation noted.  Area around the Foley catheter highly suspicious for rectal prostatic fistula  Assessment & Plan:    1. Urothelial carcinoma, locally advanced  - s/p brachytherapy in 2006, TURP 10/2017  - Discussed suspicion of connection between prostate and rectum secondary to cancer growth.  - Will discuss further with  Dr. [name] in [place] discussion today with patients family today about overall disease process, progression and prognosis             -My concern at this point is that he will eventually become septic, obstructed or other complications however any surgical intervention would be palliative thus the benefits are limited    2. Constipation  - Evident on imaging  - Discussed the importance that he relieve his constipation and recommended over-the-counter laxatives, i.e. Milk or Magnesium, and then implementation of daily  stool softener  3. 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 Options limited as he is a poor surgical candidate            -Foley is malpositioned in the prostate, difficulty getting the Foley in the bladder likely secondary to disease progression and bladder neck contracture           -- Plan for voiding today, closely followed, and re-evaluation of symptoms tomorrow given malpositioned Foley           -He is unable to void, may consider placement of SP tube versus bilateral percutaneous nephrostomy tubes to divert urine          -Discussed case with general surgery, ? Role of diverting colostomy.  Collin Gonzales is not particularly interested in this and is concerned about undergoing major surgical procedure     Return for return before end of day if symptoms worsen without catheter., see again tomorrow and follow closely  Guy [address] [place], [place] [address] [phone]  I, Temidayo Atanda-Ogunleye , am acting as a scribe for Hollice Espy, MD  I spent  40 min with this patient of which greater than 50% was spent in counseling and coordination of care with the patient.

## 2018-03-27 NOTE — Progress Notes (Addendum)
03/28/2018  1:19 PM   Xai EUELL SCHIFF 1935-11-08 [id number]  Referring provider: [name], MD [address] [place], [place] [address]  Chief Complaint  Patient presents with  . Follow-up    HPI: LAKODA MCANANY is a 83 y.o. male African American who presents today for a symptom recheck the day after catheter removal.  He has a history of high-grade invasive urothelial carcinoma involving the prostate, prostate cancer, and urinary tract infections; he had a foley catheter removed yesterday after CT revealed the foley low lying within the TURP defect, with the tip of the catheter within prostatic urethra near junction with the bladder.  Bladder decompressed and poorly evaluated.  He reports that he is been voiding without difficulty since his catheter was removed.  PVR today is minimal.  He did also take mag citrate yesterday for constipation which is resulted in numerous formed and liquid bowel movements.  He is feeling more comfortable in his lower abdomen at this point in time.  He is accompanied get by family members today.  They are concerned about his blood pressure, 84/48 however he is non-tachycardic and otherwise asymptomatic.  He denies any dizziness with standing, or any other concerning symptoms.  PMH: Past Medical History:  Diagnosis Date  . Arthritis   . Collagen vascular disease (HCC)    rheumatoid arthritis  . COPD (chronic obstructive pulmonary disease) ([place])   . GERD (gastroesophageal reflux disease)   . History of prostate cancer   . HLD (hyperlipidemia)   . HOH (hard of hearing)   . Insomnia   . Lung cancer ([place]) 2017   rad tx's.  . Oxygen deficit    2L HS AND PRN  . Prostate cancer ([place]) 2006   Rad seed tx's.  . Stroke [facility])    2000    Surgical History: Past Surgical History:  Procedure Laterality Date  . APPENDECTOMY  1965  . CATARACT EXTRACTION W/PHACO Right 01/05/2016   Procedure: CATARACT EXTRACTION PHACO AND  INTRAOCULAR LENS PLACEMENT (IOC);  Surgeon: [name], MD;  Location: [facility] ORS;  Service: Ophthalmology;  Laterality: Right;  [place] 1.14 AP% 18.1 CDE 13.44 FLUID PACK LOT # [id number] H  . CYSTOSCOPY W/ RETROGRADES Bilateral 11/01/2017   Procedure: CYSTOSCOPY WITH RETROGRADE PYELOGRAM;  Surgeon: Hollice Espy, MD;  Location: [facility] ORS;  Service: Urology;  Laterality: Bilateral;  . ENDOBRONCHIAL ULTRASOUND N/A 02/26/2015   Procedure: ENDOBRONCHIAL ULTRASOUND;  Surgeon: Flora Lipps, MD;  Location: [facility] ORS;  Service: Cardiopulmonary;  Laterality: N/A;  . [place]  . INSERTION PROSTATE RADIATION SEED  2002  . TRANSURETHRAL RESECTION OF PROSTATE N/A 11/01/2017   Procedure: TRANSURETHRAL RESECTION OF THE PROSTATE (TURP) (channel TURP);  Surgeon: Hollice Espy, MD;  Location: [facility] ORS;  Service: Urology;  Laterality: N/A;    Home Medications:  Allergies as of 03/28/2018      Reactions   Plaquenil [hydroxychloroquine]    Bad dreams   Simvastatin Other (See Comments)   Bad dreams      Medication List       Accurate as of March 28, 2018  1:19 PM. Always use your most recent med list.        finasteride 5 MG tablet Commonly known as:  PROSCAR Take 5 mg by mouth daily.   Fluticasone-Umeclidin-Vilant 100-62.5-25 MCG/INH Aepb Commonly known as:  TRELEGY ELLIPTA Inhale 1 puff into the lungs daily.   folic acid 1 MG tablet Commonly known as:  FOLVITE Take 1 tablet (1  mg total) by mouth daily.   megestrol 20 MG tablet Commonly known as:  MEGACE TAKE 1 TABLET BY MOUTH TWICE A DAY   methotrexate 2.5 MG tablet Commonly known as:  RHEUMATREX Take 5 tablets by mouth once a week.   OXYGEN Place 2 L into the nose daily as needed.   prochlorperazine 10 MG tablet Commonly known as:  COMPAZINE Take 1 tablet (10 mg total) by mouth every 6 (six) hours as needed (Nausea or vomiting).   QUEtiapine 25 MG tablet Commonly known as:  SEROQUEL TAKE 1 TABLET BY MOUTH EVERYDAY AT  BEDTIME   tamsulosin 0.4 MG Caps capsule Commonly known as:  FLOMAX Take 1 capsule (0.4 mg total) by mouth daily.       Allergies:  Allergies  Allergen Reactions  . Plaquenil [Hydroxychloroquine]     Bad dreams  . Simvastatin Other (See Comments)    Bad dreams    Family History: Family History  Problem Relation Age of Onset  . Cancer Mother   . Cancer Father        Lung Cancer  . Cancer Sister        breast  . Cancer Brother        stomach    Social History:  reports that he quit smoking about 5 years ago. His smoking use included cigarettes. He has a 25.00 pack-year smoking history. He has never used smokeless tobacco. He reports that he does not drink alcohol or use drugs.  ROS: UROLOGY Frequent Urination?: Yes Hard to postpone urination?: No Burning/pain with urination?: No Get up at night to urinate?: No Leakage of urine?: Yes Urine stream starts and stops?: Yes Trouble starting stream?: Yes Do you have to strain to urinate?: Yes Blood in urine?: No Urinary tract infection?: No Sexually transmitted disease?: No Injury to kidneys or bladder?: No Painful intercourse?: No Weak stream?: Yes Erection problems?: No Penile pain?: No  Gastrointestinal Nausea?: No Vomiting?: No Indigestion/heartburn?: No Diarrhea?: No Constipation?: No  Constitutional Fever: No Night sweats?: No Weight loss?: No Fatigue?: Yes  Skin Skin rash/lesions?: No Itching?: No  Eyes Blurred vision?: No Double vision?: No  Ears/Nose/Throat Sore throat?: No Sinus problems?: No  Hematologic/Lymphatic Swollen glands?: No Easy bruising?: No  Cardiovascular Leg swelling?: No Chest pain?: No  Respiratory Cough?: No Shortness of breath?: Yes  Endocrine Excessive thirst?: No  Musculoskeletal Back pain?: No Joint pain?: No  Neurological Headaches?: No Dizziness?: No  Psychologic Depression?: No Anxiety?: No  Physical Exam: BP (!) 80/51   Pulse 96   Ht  5\' 6"  (1.676 m)   Wt 108 lb (49 kg)   BMI 17.43 kg/m   Constitutional:  Well nourished. Alert and oriented, No acute distress.  But by 2 family members again today.  Thin, cachectic appearing.  Extremely malodorous. HEENT: [place] AT, moist mucus membranes.  Trachea midline, no masses. Cardiovascular: No clubbing, cyanosis, or edema. Respiratory: Normal respiratory effort, no increased work of breathing. GI: Abdomen is soft, non tender, non distended, no abdominal masses.  Nonpalpable letter. Skin: No rashes, bruises or suspicious lesions. Neurologic: Grossly intact, no focal deficits, moving all 4 extremities. Psychiatric: Normal mood and affect.  Laboratory Data: Lab Results  Component Value Date   WBC 5.5 02/22/2018   HGB 9.6 (L) 02/22/2018   HCT 31.9 (L) 02/22/2018   MCV 93.0 02/22/2018   PLT 242 02/22/2018   Lab Results  Component Value Date   CREATININE 1.12 02/22/2018   Results for Kenney Houseman  Jerilynn Mages (MRN [id number]) as of 03/28/2018 12:24  Ref. Range 09/13/2017 08:46  Prostate Specific Ag, Serum Latest Ref Range: 0.0 - 4.0 ng/mL <0.1    Assessment & Plan:    1.  Locally advanced urothelial carcinoma By Dr. 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 [name] yesterday who will see the patient in the near future however likely few alternatives I did have a frank discussion again today with the patient his family about his overall prognosis, poor Recommend consideration of additional palliative care options versus hospice Dr. [name] will further this discussion  2. Foley Catheter Removal - CT revealed the foley low lying within the TURP defect with the tip of catheter within prostatic urethra near junction within the bladder.  Bladder decompressed and poorly evaluated. - Catheter was removed  yesterday (03/27/2018) -He was to be voiding spontaneously at this point in time without difficulty thus we will leave the Foley catheter out for the time being especially in the setting of difficult placement, likely secondary to bladder neck contracture/fibrosis  3. Constipation -Responded well to aggressive bowel regimen, mag citrate -Recommend addition of Colace twice daily for maintenance  4.  Rectorostatic fistula Was discussed briefly with Dr. Adora Fridge yesterday, any surgical intervention would be palliative in likely not well tolerated given his medical comorbidities Patient is not interested in diverted at this point in time and is now doing well with Foley/bowel regimen Continue supportive care Refer to general surgery if Mr. Angelos changes his mind  Return in about 2 weeks (around 04/11/2018) for recheck .  Hollice Espy, MD  [facility] [address] [place], [place] [address] [phone]  .

## 2018-03-28 ENCOUNTER — Ambulatory Visit (INDEPENDENT_AMBULATORY_CARE_PROVIDER_SITE_OTHER): Payer: Medicare Other | Admitting: Urology

## 2018-03-28 ENCOUNTER — Encounter: Payer: Self-pay | Admitting: Urology

## 2018-03-28 VITALS — BP 84/48 | HR 96 | Ht 66.0 in | Wt 108.0 lb

## 2018-03-28 DIAGNOSIS — C675 Malignant neoplasm of bladder neck: Secondary | ICD-10-CM

## 2018-03-28 DIAGNOSIS — N4289 Other specified disorders of prostate: Secondary | ICD-10-CM

## 2018-03-28 DIAGNOSIS — K5900 Constipation, unspecified: Secondary | ICD-10-CM

## 2018-03-28 LAB — BLADDER SCAN AMB NON-IMAGING

## 2018-03-28 NOTE — Progress Notes (Signed)
Catheter Removal  Patient is present today for a catheter removal.  42ml of water was drained from the balloon. A 16FR foley cath was removed from the bladder no complications were noted . Patient tolerated well.  Preformed by: Fonnie Jarvis, CMA

## 2018-03-30 ENCOUNTER — Ambulatory Visit (INDEPENDENT_AMBULATORY_CARE_PROVIDER_SITE_OTHER): Payer: Medicare Other | Admitting: Family Medicine

## 2018-03-30 ENCOUNTER — Encounter: Payer: Self-pay | Admitting: Family Medicine

## 2018-03-30 VITALS — BP 94/48 | HR 57 | Temp 97.3°F | Resp 20 | Ht 66.0 in | Wt 109.4 lb

## 2018-03-30 DIAGNOSIS — E876 Hypokalemia: Secondary | ICD-10-CM

## 2018-03-30 DIAGNOSIS — G4709 Other insomnia: Secondary | ICD-10-CM | POA: Diagnosis not present

## 2018-03-30 DIAGNOSIS — E440 Moderate protein-calorie malnutrition: Secondary | ICD-10-CM

## 2018-03-30 MED ORDER — QUETIAPINE FUMARATE 50 MG PO TABS: 50.0000 mg | ORAL_TABLET | Freq: Every day | ORAL | 0 refills | Status: DC

## 2018-03-30 MED ORDER — MEGESTROL ACETATE 20 MG PO TABS: 20.0000 mg | ORAL_TABLET | Freq: Two times a day (BID) | ORAL | 1 refills | Status: AC

## 2018-03-30 NOTE — Progress Notes (Signed)
Name: Collin Gonzales   MRN: [id number]    DOB: [birth year]   Date:03/30/2018       Progress Note  Subjective  Chief Complaint  Chief Complaint  Patient presents with  . Follow-up    1 month F/U  . Insomnia    States he needs an increase that sometimes it works but other times he can't fall nor stay asleep  . Moderate Protein-calorie malnutrtion    Patient has losted 3 more pounds but states he was unable to pick up this medication due to indication of being on nitroglycin. Eating 2 bigs meals every day.    HPI  Malnutrition: his weight has gone down from 140 lbs 04/2015 down to 112 lbs 2019 and has been stable for months , however lost another 3 lbs since last visit. He is taking megace but still does not feel hungry, discussed protein supplements at home, he does not want to stop megace yet. Discussed small meals but frequent snacks to help with caloric intake, we will give him a list of foods to eat  Chronic insomnia: he failed Trazodone, Remeron, and also Ambien 5 mg, he states seroquel 25 mg helped him the best, however still takes a long time to fall asleep. He states going to bed around 11 pm and takes hours to fall asleep, he wakes up around 8 am. He does not watch TV in his room, he states mind is busy at night, discussed mindfulness.   Low potassium: we will recheck potassium and magnesium today  He came in alone today.    Patient Active Problem List   Diagnosis Date Noted  . Urothelial carcinoma of bladder ([place]) 11/12/2017  . Urinary retention 11/01/2017  . Dependence on supplemental oxygen 04/24/2017  . Malnutrition ([place]) 04/24/2017  . Hemiparesis affecting left side as late effect of cerebrovascular accident (CVA) ([place]) 04/24/2017  . Cataract 09/06/2016  . Drug-induced folate deficiency anemia 09/06/2016  . Esophagitis, reflux 09/06/2016  . Iron deficiency anemia due to chronic blood loss 08/23/2016  . Decrease in appetite 09/01/2015  . GERD (gastroesophageal  reflux disease) 08/05/2015  . COPD (chronic obstructive pulmonary disease) ([place]) 08/20/2014  . Dyslipidemia 08/20/2014  . Chronic insomnia 08/20/2014  . Arthritis or polyarthritis, rheumatoid ([place]) 08/20/2014  . History of prostate cancer 11/01/2013  . Flutter-fibrillation ([place]) 03/18/2005    Past Surgical History:  Procedure Laterality Date  . APPENDECTOMY  1965  . CATARACT EXTRACTION W/PHACO Right 01/05/2016   Procedure: CATARACT EXTRACTION PHACO AND INTRAOCULAR LENS PLACEMENT (IOC);  Surgeon: [name], MD;  Location: [facility] ORS;  Service: Ophthalmology;  Laterality: Right;  [place] 1.14 AP% 18.1 CDE 13.44 FLUID PACK LOT # [id number] H  . CYSTOSCOPY W/ RETROGRADES Bilateral 11/01/2017   Procedure: CYSTOSCOPY WITH RETROGRADE PYELOGRAM;  Surgeon: Hollice Espy, MD;  Location: [facility] OR[facility];  Service: Urology;  Laterality: Bilateral;  . ENDOBRONCHIAL ULTRASOUND N/A 02/26/2015   Procedure: ENDOBRONCHIAL ULTRASOUND;  Surgeon: Flora Lipps, MD;  Location: [facility] ORS;  Service: Cardiopulmonary;  Laterality: N/A;  . [place]  . INSERTION PROSTATE RADIATION SEED  2002  . TRANSURETHRAL RESECTION OF PROSTATE N/A 11/01/2017   Procedure: TRANSURETHRAL RESECTION OF THE PROSTATE (TURP) (channel TURP);  Surgeon: Hollice Espy, MD;  Location: [facility] OR[facility];  Service: Urology;  Laterality: N/A;    Family History  Problem Relation Age of Onset  . Cancer Mother   . Cancer Father        Lung Cancer  . Cancer Sister  breast  . Cancer Brother        stomach    Social History   Socioeconomic History  . Marital status: Divorced    Spouse name: Not on file  . Number of children: 5  . Years of education: Not on file  . Highest education level: 12th grade  Occupational History  . Occupation: Retired  Scientific laboratory technician  . Financial resource strain: Not hard at all  . Food insecurity:    Worry: Never true    Inability: Never true  . Transportation needs:    Medical: No    Non-medical: No   Tobacco Use  . Smoking status: Former Smoker    Packs/day: 0.50    Years: 50.00    Pack years: 25.00    Types: Cigarettes    Last attempt to quit: 02/21/2013    Years since quitting: 5.1  . Smokeless tobacco: Never Used  . Tobacco comment: smoking cessation materials not required  Substance and Sexual Activity  . Alcohol use: No    Alcohol/week: 0.0 standard drinks  . Drug use: No  . Sexual activity: Never  Lifestyle  . Physical activity:    Days per week: 0 days    Minutes per session: 0 min  . Stress: Not at all  Relationships  . Social connections:    Talks on phone: Patient refused    Gets together: Patient refused    Attends religious service: Patient refused    Active member of club or organization: Patient refused    Attends meetings of clubs or organizations: Patient refused    Relationship status: Divorced  . Intimate partner violence:    Fear of current or ex partner: No    Emotionally abused: No    Physically abused: No    Forced sexual activity: No  Other Topics Concern  . Not on file  Social History Narrative  . Not on file     Current Outpatient Medications:  .  finasteride (PROSCAR) 5 MG tablet, Take 5 mg by mouth daily., Disp: , Rfl: 11 .  Fluticasone-Umeclidin-Vilant (TRELEGY ELLIPTA) 100-62.5-25 MCG/INH AEPB, Inhale 1 puff into the lungs daily., Disp: 60 each, Rfl: 5 .  folic acid (FOLVITE) 1 MG tablet, Take 1 tablet (1 mg total) by mouth daily., Disp: 30 tablet, Rfl: 5 .  methotrexate (RHEUMATREX) 2.5 MG tablet, Take 5 tablets by mouth once a week., Disp: , Rfl:  .  OXYGEN, Place 2 L into the nose daily as needed. , Disp: , Rfl:  .  prochlorperazine (COMPAZINE) 10 MG tablet, Take 1 tablet (10 mg total) by mouth every 6 (six) hours as needed (Nausea or vomiting)., Disp: 60 tablet, Rfl: 2 .  QUEtiapine (SEROQUEL) 25 MG tablet, TAKE 1 TABLET BY MOUTH EVERYDAY AT BEDTIME, Disp: 30 tablet, Rfl: 0 .  tamsulosin (FLOMAX) 0.4 MG CAPS capsule, Take 1 capsule  (0.4 mg total) by mouth daily. (Patient taking differently: Take 0.8 mg by mouth daily. ), Disp: 30 capsule, Rfl: 11 .  megestrol (MEGACE) 20 MG tablet, TAKE 1 TABLET BY MOUTH TWICE A DAY (Patient not taking: Reported on 03/30/2018), Disp: 60 tablet, Rfl: 0  Allergies  Allergen Reactions  . Plaquenil [Hydroxychloroquine]     Bad dreams  . Simvastatin Other (See Comments)    Bad dreams    I personally reviewed active problem list, medication list, allergies, family history, social history with the patient/caregiver today.   ROS  Constitutional: Negative for fever, positive for mild  weight  change . He is clean, but has an urine odor - likely from incontinence - he wears depends.  Respiratory: positive  for mild cough and shortness of breath - stable with oxygen    Cardiovascular: Negative for chest pain or palpitations.  Gastrointestinal: Negative for abdominal pain, no bowel changes.  Musculoskeletal: Negative for gait problem or joint swelling.  Skin: Negative for rash.  Neurological: Negative for dizziness or headache.  No other specific complaints in a complete review of systems (except as listed in HPI above).  Objective  Vitals:   03/30/18 1540  BP: (!) 94/48  Pulse: (!) 57  Resp: 20  Temp: (!) 97.3 F (36.3 C)  TempSrc: Oral  SpO2: 99%  Weight: 109 lb 6.4 oz (49.6 kg)  Height: '5\' 6"'  (1.676 m)    Body mass index is 17.66 kg/m.  Physical Exam  Constitutional: Patient appears malnourished, temporal waiting No distress.  HEENT: head atraumatic, normocephalic, pupils equal and reactive to light,  neck supple, throat within normal limits. Tongue is pale Cardiovascular: Normal rate, regular rhythm and normal heart sounds.  No murmur heard. No BLE edema. Pulmonary/Chest: Effort normal and breath sounds normal. No respiratory distress. Abdominal: Soft.  There is no tenderness. Psychiatric: Patient has a normal mood and affect. behavior is normal. Judgment and thought  content normal.  Recent Results (from the past 2160 hour(s))  Comprehensive metabolic panel     Status: Abnormal   Collection Time: 01/04/18  8:07 AM  Result Value Ref Range   Sodium 139 135 - 145 mmol/L   Potassium 4.1 3.5 - 5.1 mmol/L   Chloride 104 98 - 111 mmol/L   CO2 28 22 - 32 mmol/L   Glucose, Bld 104 (H) 70 - 99 mg/dL   BUN 21 8 - 23 mg/dL   Creatinine, Ser 1.09 0.61 - 1.24 mg/dL   Calcium 9.3 8.9 - 10.3 mg/dL   Total Protein 8.8 (H) 6.5 - 8.1 g/dL   Albumin 2.8 (L) 3.5 - 5.0 g/dL   AST 18 15 - 41 U/L   ALT 10 0 - 44 U/L   Alkaline Phosphatase 65 38 - 126 U/L   Total Bilirubin 0.2 (L) 0.3 - 1.2 mg/dL   GFR calc non Af Amer >60 >60 mL/min   GFR calc Af Amer >60 >60 mL/min    Comment: (NOTE) The eGFR has been calculated using the CKD EPI equation. This calculation has not been validated in all clinical situations. eGFR's persistently <60 mL/min signify possible Chronic Kidney Disease.    Anion gap 7 5 - 15    Comment: Performed at [facility], [address] [place], [place] [address]  CBC with Differential     Status: Abnormal   Collection Time: 01/04/18  8:07 AM  Result Value Ref Range   WBC 6.1 4.0 - 10.5 K/uL   RBC 3.47 (L) 4.22 - 5.81 MIL/uL   Hemoglobin 10.0 (L) 13.0 - 17.0 g/dL   HCT 33.0 (L) 39.0 - 52.0 %   MCV 95.1 80.0 - 100.0 fL   MCH 28.8 26.0 - 34.0 pg   MCHC 30.3 30.0 - 36.0 g/dL   RDW 17.1 (H) 11.5 - 15.5 %   Platelets 265 150 - 400 K/uL   nRBC 0.0 0.0 - 0.2 %   Neutrophils Relative % 80 %   Neutro Abs 5.0 1.7 - 7.7 K/uL   Lymphocytes Relative 13 %   Lymphs Abs 0.8 0.7 - 4.0 K/uL   Monocytes Relative  4 %   Monocytes Absolute 0.2 0.1 - 1.0 K/uL   Eosinophils Relative 1 %   Eosinophils Absolute 0.1 0.0 - 0.5 K/uL   Basophils Relative 1 %   Basophils Absolute 0.0 0.0 - 0.1 K/uL   Immature Granulocytes 1 %   Abs Immature Granulocytes 0.03 0.00 - 0.07 K/uL    Comment: Performed at [facility], [address] [place],  [place] [address]  Thyroid Panel With TSH     Status: None   Collection Time: 01/04/18  8:07 AM  Result Value Ref Range   TSH 1.990 0.450 - 4.500 uIU/mL   T4, Total 8.4 4.5 - 12.0 ug/dL   T3 Uptake Ratio 29 24 - 39 %   Free Thyroxine Index 2.4 1.2 - 4.9    Comment: (NOTE) Performed At: [facility] [place], [place] [id number] Rush Farmer MD [id number]   Comprehensive metabolic panel     Status: Abnormal   Collection Time: 01/25/18  1:13 PM  Result Value Ref Range   Sodium 139 135 - 145 mmol/L   Potassium 3.8 3.5 - 5.1 mmol/L   Chloride 101 98 - 111 mmol/L   CO2 30 22 - 32 mmol/L   Glucose, Bld 125 (H) 70 - 99 mg/dL   BUN 21 8 - 23 mg/dL   Creatinine, Ser 1.11 0.61 - 1.24 mg/dL   Calcium 8.8 (L) 8.9 - 10.3 mg/dL   Total Protein 8.7 (H) 6.5 - 8.1 g/dL   Albumin 2.7 (L) 3.5 - 5.0 g/dL   AST 18 15 - 41 U/L   ALT 9 0 - 44 U/L   Alkaline Phosphatase 67 38 - 126 U/L   Total Bilirubin 0.3 0.3 - 1.2 mg/dL   GFR calc non Af Amer >60 >60 mL/min   GFR calc Af Amer >60 >60 mL/min   Anion gap 8 5 - 15    Comment: Performed at [facility], [address] [place], [place] [address]  CBC with Differential     Status: Abnormal   Collection Time: 01/25/18  1:13 PM  Result Value Ref Range   WBC 6.3 4.0 - 10.5 K/uL   RBC 3.52 (L) 4.22 - 5.81 MIL/uL   Hemoglobin 10.0 (L) 13.0 - 17.0 g/dL   HCT 33.4 (L) 39.0 - 52.0 %   MCV 94.9 80.0 - 100.0 fL   MCH 28.4 26.0 - 34.0 pg   MCHC 29.9 (L) 30.0 - 36.0 g/dL   RDW 17.7 (H) 11.5 - 15.5 %   Platelets 349 150 - 400 K/uL   nRBC 0.0 0.0 - 0.2 %   Neutrophils Relative % 72 %   Neutro Abs 4.6 1.7 - 7.7 K/uL   Lymphocytes Relative 14 %   Lymphs Abs 0.9 0.7 - 4.0 K/uL   Monocytes Relative 11 %   Monocytes Absolute 0.7 0.1 - 1.0 K/uL   Eosinophils Relative 1 %   Eosinophils Absolute 0.1 0.0 - 0.5 K/uL   Basophils Relative 1 %   Basophils Absolute 0.0 0.0 - 0.1 K/uL   Immature Granulocytes 1 %   Abs Immature Granulocytes  0.03 0.00 - 0.07 K/uL    Comment: Performed at [facility], [address] [place],  [address]  Thyroid Panel With TSH     Status: None   Collection Time: 01/25/18  1:13 PM  Result Value Ref Range   TSH 0.932 0.450 - 4.500 uIU/mL   T4, Total 8.2 4.5 - 12.0 ug/dL  T3 Uptake Ratio 32 24 - 39 %   Free Thyroxine Index 2.6 1.2 - 4.9    Comment: (NOTE) Performed At: [facility] [place], [place] [id number] Rush Farmer MD [id number]   Urinalysis, Complete     Status: Abnormal   Collection Time: 02/02/18 11:45 AM  Result Value Ref Range   Specific Gravity, UA >1.030 (H) 1.005 - 1.030   pH, UA 6.0 5.0 - 7.5   Color, UA Yellow Yellow   Appearance Ur Cloudy (A) Clear   Leukocytes, UA 1+ (A) Negative   Protein, UA 3+ (A) Negative/Trace   Glucose, UA Negative Negative   Ketones, UA Negative Negative   RBC, UA 2+ (A) Negative   Bilirubin, UA Negative Negative   Urobilinogen, Ur 1.0 0.2 - 1.0 mg/dL   Nitrite, UA Negative Negative   Microscopic Examination See below:   Microscopic Examination     Status: Abnormal   Collection Time: 02/02/18 11:45 AM  Result Value Ref Range   WBC, UA 11-30 (A) 0 - 5 /hpf   RBC, UA 3-10 (A) 0 - 2 /hpf   Epithelial Cells (non renal) 0-10 0 - 10 /hpf   Mucus, UA Present (A) Not Estab.   Bacteria, UA Many (A) None seen/Few  CULTURE, URINE COMPREHENSIVE     Status: Abnormal   Collection Time: 02/02/18 12:16 PM  Result Value Ref Range   Urine Culture, Comprehensive Final report (A)    Organism ID, Bacteria Proteus mirabilis (A)     Comment: 25,000-50,000 colony forming units per mL Cefazolin <=4 ug/mL Cefazolin with an MIC <=16 predicts susceptibility to the oral agents cefaclor, cefdinir, cefpodoxime, cefprozil, cefuroxime, cephalexin, and loracarbef when used for therapy of uncomplicated urinary tract infections due to E. coli, Klebsiella pneumoniae, and Proteus mirabilis.    ANTIMICROBIAL SUSCEPTIBILITY  Comment     Comment:       ** S = Susceptible; I = Intermediate; R = Resistant **                    P = Positive; N = Negative             MICS are expressed in micrograms per mL    Antibiotic                 RSLT#1    RSLT#2    RSLT#3    RSLT#4 Amoxicillin/Clavulanic Acid    S Ampicillin                     S Cefepime                       S Ceftriaxone                    S Cefuroxime                     S Ciprofloxacin                  S Ertapenem                      S Gentamicin                     S Levofloxacin                   S Meropenem  S Nitrofurantoin                 R Piperacillin/Tazobactam        S Tetracycline                   R Tobramycin                     S Trimethoprim/Sulfa             S   Comprehensive metabolic panel     Status: Abnormal   Collection Time: 02/22/18 10:59 AM  Result Value Ref Range   Sodium 138 135 - 145 mmol/L   Potassium 3.4 (L) 3.5 - 5.1 mmol/L   Chloride 105 98 - 111 mmol/L   CO2 24 22 - 32 mmol/L   Glucose, Bld 105 (H) 70 - 99 mg/dL   BUN 26 (H) 8 - 23 mg/dL   Creatinine, Ser 1.12 0.61 - 1.24 mg/dL   Calcium 9.1 8.9 - 10.3 mg/dL   Total Protein 9.1 (H) 6.5 - 8.1 g/dL   Albumin 2.5 (L) 3.5 - 5.0 g/dL   AST 22 15 - 41 U/L   ALT 14 0 - 44 U/L   Alkaline Phosphatase 59 38 - 126 U/L   Total Bilirubin 0.4 0.3 - 1.2 mg/dL   GFR calc non Af Amer >60 >60 mL/min   GFR calc Af Amer >60 >60 mL/min   Anion gap 9 5 - 15    Comment: Performed at [facility], [address] [place], [place] [address]  CBC with Differential     Status: Abnormal   Collection Time: 02/22/18 10:59 AM  Result Value Ref Range   WBC 5.5 4.0 - 10.5 K/uL   RBC 3.43 (L) 4.22 - 5.81 MIL/uL   Hemoglobin 9.6 (L) 13.0 - 17.0 g/dL   HCT 31.9 (L) 39.0 - 52.0 %   MCV 93.0 80.0 - 100.0 fL   MCH 28.0 26.0 - 34.0 pg   MCHC 30.1 30.0 - 36.0 g/dL   RDW 18.0 (H) 11.5 - 15.5 %   Platelets 242 150 - 400 K/uL   nRBC 0.0 0.0 - 0.2 %    Neutrophils Relative % 78 %   Neutro Abs 4.3 1.7 - 7.7 K/uL   Lymphocytes Relative 16 %   Lymphs Abs 0.9 0.7 - 4.0 K/uL   Monocytes Relative 4 %   Monocytes Absolute 0.2 0.1 - 1.0 K/uL   Eosinophils Relative 2 %   Eosinophils Absolute 0.1 0.0 - 0.5 K/uL   Basophils Relative 0 %   Basophils Absolute 0.0 0.0 - 0.1 K/uL   Immature Granulocytes 0 %   Abs Immature Granulocytes 0.02 0.00 - 0.07 K/uL    Comment: Performed at [facility], [address] [place], [place] [address]  Thyroid Panel With TSH     Status: None   Collection Time: 02/22/18 10:59 AM  Result Value Ref Range   TSH 1.710 0.450 - 4.500 uIU/mL   T4, Total 7.5 4.5 - 12.0 ug/dL   T3 Uptake Ratio 30 24 - 39 %   Free Thyroxine Index 2.3 1.2 - 4.9    Comment: (NOTE) Performed At: [facility] [place], [place] [id number] Rush Farmer MD [id number]   BLADDER SCAN AMB NON-IMAGING     Status: None   Collection Time: 03/28/18  1:22 PM  Result Value Ref Range   Scan Result 44m  PHQ2/9: Depression screen [facility] 2/9 11/24/2017 10/11/2017 08/16/2017 08/08/2017 04/24/2017  Decreased Interest 0 0 0 0 0  Down, Depressed, Hopeless 0 0 0 0 0  PHQ - 2 Score 0 0 0 0 0  Altered sleeping 3 0 - 0 -  Tired, decreased energy 3 0 - 0 -  Change in appetite 1 0 - 0 -  Feeling bad or failure about yourself  0 0 - 0 -  Trouble concentrating 0 0 - 0 -  Moving slowly or fidgety/restless 0 0 - 0 -  Suicidal thoughts 0 0 - 0 -  PHQ-9 Score 7 0 - 0 -  Difficult doing work/chores Not difficult at all Not difficult at all - Not difficult at all -     Fall Risk: Fall Risk  03/30/2018 11/24/2017 10/11/2017 08/16/2017 08/08/2017  Falls in the past year? 0 No No No No  Number falls in past yr: - - - - -  Injury with Fall? - - - - -  Risk for fall due to : - - - - Impaired vision;Other (Comment);Medication side effect;Impaired mobility  Risk for fall due to: Comment - - - - wears eyeglasses; use of O2, weakness,  dyspnea     Functional Status Survey: Is the patient deaf or have difficulty hearing?: Yes(Left Ear Hearing Loss) Does the patient have difficulty seeing, even when wearing glasses/contacts?: Yes Does the patient have difficulty concentrating, remembering, or making decisions?: No Does the patient have difficulty walking or climbing stairs?: Yes Does the patient have difficulty dressing or bathing?: No Does the patient have difficulty doing errands alone such as visiting a doctor's office or shopping?: No    Assessment & Plan  1. Hypokalemia  - Potassium - Magnesium  2. Moderate protein-calorie malnutrition (HCC)  - megestrol (MEGACE) 20 MG tablet; Take 1 tablet (20 mg total) by mouth 2 (two) times daily.  Dispense: 180 tablet; Refill: 1  3. Other insomnia  - QUEtiapine (SEROQUEL) 50 MG tablet; Take 1 tablet (50 mg total) by mouth at bedtime.  Dispense: 90 tablet; Refill: 0

## 2018-03-30 NOTE — Patient Instructions (Signed)

## 2018-03-31 LAB — POTASSIUM: Potassium: 4.8 mmol/L (ref 3.5–5.3)

## 2018-03-31 LAB — MAGNESIUM: Magnesium: 2.1 mg/dL (ref 1.5–2.5)

## 2018-04-05 ENCOUNTER — Ambulatory Visit: Payer: Medicare Other

## 2018-04-06 ENCOUNTER — Ambulatory Visit: Payer: Medicare Other

## 2018-04-08 DIAGNOSIS — J449 Chronic obstructive pulmonary disease, unspecified: Secondary | ICD-10-CM | POA: Diagnosis not present

## 2018-04-08 NOTE — Progress Notes (Signed)
Collin Gonzales  [phone] [phone]  ID: Collin Gonzales OB: [birth year]  MR#: [id number]  GXQ#:[id number]  Patient Care Team: [name], MD as PCP - General (Family Medicine) [name], MD as Referring Physician (Ophthalmology) [name], MD as Consulting Physician (Rheumatology) [name], MD as Referring Physician (Specialist) [name], MD as Consulting Physician (Urology) [name], MD as Referring Physician (Radiation Oncology)  CHIEF COMPLAINT: Locally advanced high-grade urothelial carcinoma.    INTERVAL HISTORY: Patient returns to clinic today for further evaluation and consideration of cycle 7 of Tecentriq.  He was evaluated by urology in the interim and thought to have progressive disease on CT scan and evaluation.  He currently feels well and is asymptomatic.  He has had his Foley removed and declines any urinary complaints.  He has no neurologic complaints.  He denies any recent fevers or illnesses.  He has chronic shortness of breath, but denies any chest pain, hemoptysis, or cough.  He has no nausea, vomiting, constipation, or diarrhea.  Patient offers no further specific complaints today.  REVIEW OF SYSTEMS:   Review of Systems  Constitutional: Negative.  Negative for fever, malaise/fatigue and weight loss.  Respiratory: Negative.  Negative for cough, hemoptysis and shortness of breath.   Cardiovascular: Negative.  Negative for chest pain and leg swelling.  Gastrointestinal: Negative.  Negative for abdominal pain, blood in stool and melena.  Genitourinary: Negative.  Negative for dysuria and hematuria.  Musculoskeletal: Negative.  Negative for back pain.  Skin: Negative.  Negative for rash.  Neurological: Negative.  Negative for sensory change, focal weakness, weakness and headaches.  Psychiatric/Behavioral: Negative.  The patient is not nervous/anxious.     As per HPI.  Otherwise, a complete review of systems is negative.  PAST MEDICAL HISTORY: Past Medical History:  Diagnosis Date  . Arthritis   . Collagen vascular disease (HCC)    rheumatoid arthritis  . COPD (chronic obstructive pulmonary disease) ([place])   . GERD (gastroesophageal reflux disease)   . History of prostate cancer   . HLD (hyperlipidemia)   . HOH (hard of hearing)   . Insomnia   . Lung cancer ([place]) 2017   rad tx's.  . Oxygen deficit    2L HS AND PRN  . Prostate cancer ([place]) 2006   Rad seed tx's.  . Stroke ([place])    2000    PAST SURGICAL HISTORY: Past Surgical History:  Procedure Laterality Date  . APPENDECTOMY  1965  . CATARACT EXTRACTION W/PHACO Right 01/05/2016   Procedure: CATARACT EXTRACTION PHACO AND INTRAOCULAR LENS PLACEMENT (IOC);  Surgeon: [name], MD;  Location: [facility] ORS;  Service: Ophthalmology;  Laterality: Right;  [place] 1.14 AP% 18.1 CDE 13.44 FLUID PACK LOT # [id number] H  . CYSTOSCOPY W/ RETROGRADES Bilateral 11/01/2017   Procedure: CYSTOSCOPY WITH RETROGRADE PYELOGRAM;  Surgeon: Hollice Espy, MD;  Location: [facility] ORS;  Service: Urology;  Laterality: Bilateral;  . ENDOBRONCHIAL ULTRASOUND N/A 02/26/2015   Procedure: ENDOBRONCHIAL ULTRASOUND;  Surgeon: Flora Lipps, MD;  Location: [facility] ORS;  Service: Cardiopulmonary;  Laterality: N/A;  . [place]  . INSERTION PROSTATE RADIATION SEED  2002  . TRANSURETHRAL RESECTION OF PROSTATE N/A 11/01/2017   Procedure: TRANSURETHRAL RESECTION OF THE PROSTATE (TURP) (channel TURP);  Surgeon: Hollice Espy, MD;  Location: [facility] ORS;  Service: Urology;  Laterality: N/A;    FAMILY HISTORY: Family History  Problem Relation Age of Onset  . Cancer Mother   . Cancer Father  Lung Cancer  . Cancer Sister        breast  . Cancer Brother        stomach    ADVANCED DIRECTIVES (Y/N):  N  HEALTH MAINTENANCE: Social History   Tobacco Use  . Smoking status: Former Smoker    Packs/day: 0.50    Years: 50.00      Pack years: 25.00    Types: Cigarettes    Last attempt to quit: 02/21/2013    Years since quitting: 5.1  . Smokeless tobacco: Never Used  . Tobacco comment: smoking cessation materials not required  Substance Use Topics  . Alcohol use: No    Alcohol/week: 0.0 standard drinks  . Drug use: No     Colonoscopy:  PAP:  Bone density:  Lipid panel:  Allergies  Allergen Reactions  . Plaquenil [Hydroxychloroquine]     Bad dreams  . Simvastatin Other (See Comments)    Bad dreams    Current Outpatient Medications  Medication Sig Dispense Refill  . finasteride (PROSCAR) 5 MG tablet Take 5 mg by mouth daily.  11  . Fluticasone-Umeclidin-Vilant (TRELEGY ELLIPTA) 100-62.5-25 MCG/INH AEPB Inhale 1 puff into the lungs daily. 60 each 5  . folic acid (FOLVITE) 1 MG tablet Take 1 tablet (1 mg total) by mouth daily. 30 tablet 5  . megestrol (MEGACE) 20 MG tablet Take 1 tablet (20 mg total) by mouth 2 (two) times daily. 180 tablet 1  . methotrexate (RHEUMATREX) 2.5 MG tablet Take 5 tablets by mouth once a week.    . OXYGEN Place 2 L into the nose daily as needed.     . prochlorperazine (COMPAZINE) 10 MG tablet Take 1 tablet (10 mg total) by mouth every 6 (six) hours as needed (Nausea or vomiting). 60 tablet 2  . QUEtiapine (SEROQUEL) 50 MG tablet Take 1 tablet (50 mg total) by mouth at bedtime. 90 tablet 0  . tamsulosin (FLOMAX) 0.4 MG CAPS capsule Take 1 capsule (0.4 mg total) by mouth daily. (Patient taking differently: Take 0.8 mg by mouth daily. ) 30 capsule 11   No current facility-administered medications for this visit.     OBJECTIVE: Vitals:   04/12/18 1102  BP: 109/68  Pulse: 84  Temp: 97.8 F (36.6 C)  SpO2: (!) 88%     Body mass index is 17.75 kg/m.    ECOG FS:0 - Asymptomatic  General: Well-developed, well-nourished, no acute distress. Eyes: Pink conjunctiva, anicteric sclera. HEENT: Normocephalic, moist mucous membranes. Lungs: Clear to auscultation bilaterally. Heart:  Regular rate and rhythm. No rubs, murmurs, or gallops. Abdomen: Soft, nontender, nondistended. No organomegaly noted, normoactive bowel sounds. Musculoskeletal: No edema, cyanosis, or clubbing. Neuro: Alert, answering all questions appropriately. Cranial nerves grossly intact. Skin: No rashes or petechiae noted. Psych: Normal affect.  LAB RESULTS:  Lab Results  Component Value Date   NA 138 04/12/2018   K 4.2 04/12/2018   CL 106 04/12/2018   CO2 24 04/12/2018   GLUCOSE 119 (H) 04/12/2018   BUN 26 (H) 04/12/2018   CREATININE 1.18 04/12/2018   CALCIUM 8.7 (L) 04/12/2018   PROT 8.9 (H) 04/12/2018   ALBUMIN 2.4 (L) 04/12/2018   AST 20 04/12/2018   ALT 12 04/12/2018   ALKPHOS 72 04/12/2018   BILITOT 0.3 04/12/2018   GFRNONAA 58 (L) 04/12/2018   GFRAA >60 04/12/2018    Lab Results  Component Value Date   WBC 5.9 04/12/2018   NEUTROABS 3.2 04/12/2018   HGB 9.8 (L) 04/12/2018  HCT 32.9 (L) 04/12/2018   MCV 91.4 04/12/2018   PLT 348 04/12/2018     STUDIES: Ct Pelvis Wo Contrast  Result Date: 03/26/2018 CLINICAL DATA:  Urothelial carcinoma involving the prostate status post TURP 11/01/2017. Bladder outlet obstruction requiring Foley catheter placement. Stool in urine. Clinical concern for colovesical fistula. Remote history of prostate cancer status post brachytherapy. EXAM: CT PELVIS WITHOUT CONTRAST TECHNIQUE: Multidetector CT imaging of the pelvis was performed following the standard protocol without intravenous contrast. COMPARISON:  11/22/2017 PET-CT.  11/14/2017 CT abdomen/pelvis. FINDINGS: Urinary Tract: Bladder is collapsed and poorly evaluated on this scan. Small amount of nonspecific gas is scattered within the collapsed bladder. Bowel: Evaluation of the bowel is limited by absence of enteric and IV contrast. There is apparent new circumferential wall thickening in the nondistended lower rectum (series 2/image 39). Mild sigmoid diverticulosis, with no evidence of sigmoid  colon wall thickening. No dilated or thick-walled pelvic small bowel loops. Moderate to large amount of stool throughout the visualized colon. Vascular/Lymphatic: Atherosclerotic nonaneurysmal visualized abdominal aorta. Bilateral iliofemoral atherosclerosis. No pathologically enlarged lymph nodes in the pelvis. Reproductive: Status post interval TURP. Foley catheter balloon is dilated within the TURP defect. Tip of Foley catheter is located within the prostatic urethra near the junction with the bladder. A few brachytherapy seeds are still visualized at the periphery of the remnant prostate. A tiny focus of gas is present at the interface of the remnant prostate and rectum in the midline (series 2/image 39), difficult to exclude a rectal-prostatic fistula. Other: No focal fluid collections.  No pelvic ascites. Musculoskeletal: No aggressive appearing focal osseous lesions. Marked lower lumbar spondylosis. IMPRESSION: 1. Very limited scan due to the absence of enteric and IV contrast. 2. Foley catheter is low lying with the Foley balloon dilated within the TURP defect and with tip of Foley catheter within the prostatic urethra near the junction with the bladder. Bladder is decompressed and poorly evaluated on this scan. 3. Apparent new circumferential wall thickening in the nondistended lower rectum, cannot exclude proctitis. 4. Nonspecific tiny focus of gas in the midline at the interface of the remnant prostate and rectum, difficult to exclude a rectal-prostatic fistula. Consider further evaluation with CT pelvis with IV contrast after instillation of rectal contrast via a rectal catheter. 5. Moderate to large colonic stool volume, suggesting constipation. Electronically Signed   By: Ilona Sorrel M.D.   On: 03/26/2018 16:44    ASSESSMENT: Locally advanced high-grade urothelial carcinoma.  PLAN:    1. Locally advanced high-grade urothelial carcinoma: Case discussed with urology, radiation oncology, as well  as multidisciplinary tumor board.  Patient is not a surgical candidate nor can he receive additional XRT given his history of prostate cancer and brachii therapy seed placement.  Though he has a decent performance status, patient is frail and likely would not tolerate cisplatin based therapy.  CT scan results from March 26, 2018 reviewed independently and report as above with concern for recurrence of disease.  Case was also discussed with Dr. Erlene Quan of urology.  After lengthy discussion with the patient, he wishes to continue palliative treatment with Tecentriq expressing understanding that it may not be offering much benefit.  Plan to do 2 additional cycles and then repeat CT scan in approximately 2 months.  If patient has continued progression of disease, he may tolerate single agent gemcitabine.  Hospice was also discussed today, but patient is not interested at this time.  He had an appointment with palliative care today.  Return to clinic in 3 weeks for further evaluation and consideration of cycle 8 of Tecentriq.   2.  Right upper lobe lung mass: Previously, although biopsy was negative, patient had a positive PET scan that was highly suspicious for underlying malignancy. He underwent SBRT in approximately May 2017.   3.  Shortness of breath: Chronic and unchanged.  Patient's oxygen saturations are below 90% in clinic and recommended that he use his oxygen which he has at home more frequently. 4.  Anemia: Patient's hemoglobin is decreased, but stable at 9.8.   5.  Hematuria: Patient does not complain of this today.  Continue follow-up with urology as indicated. 6.  Hypokalemia: Resolved.  Patient expressed understanding and was in agreement with this plan. He also understands that He can call clinic at any time with any questions, concerns, or complaints.    Lloyd Huger, MD   04/12/2018 4:54 PM

## 2018-04-09 ENCOUNTER — Other Ambulatory Visit: Payer: Self-pay | Admitting: Family Medicine

## 2018-04-09 DIAGNOSIS — F5104 Psychophysiologic insomnia: Secondary | ICD-10-CM

## 2018-04-10 ENCOUNTER — Ambulatory Visit: Payer: Medicare Other | Admitting: Urology

## 2018-04-12 ENCOUNTER — Inpatient Hospital Stay (HOSPITAL_BASED_OUTPATIENT_CLINIC_OR_DEPARTMENT_OTHER): Payer: Medicare Other | Admitting: Hospice and Palliative Medicine

## 2018-04-12 ENCOUNTER — Inpatient Hospital Stay (HOSPITAL_BASED_OUTPATIENT_CLINIC_OR_DEPARTMENT_OTHER): Payer: Medicare Other | Admitting: Oncology

## 2018-04-12 ENCOUNTER — Inpatient Hospital Stay: Payer: Medicare Other | Attending: Oncology

## 2018-04-12 ENCOUNTER — Other Ambulatory Visit: Payer: Self-pay

## 2018-04-12 ENCOUNTER — Inpatient Hospital Stay: Payer: Medicare Other

## 2018-04-12 VITALS — BP 109/68 | HR 84 | Temp 97.8°F | Ht 66.0 in | Wt 110.0 lb

## 2018-04-12 VITALS — HR 76 | Resp 20

## 2018-04-12 DIAGNOSIS — Z85118 Personal history of other malignant neoplasm of bronchus and lung: Secondary | ICD-10-CM | POA: Diagnosis not present

## 2018-04-12 DIAGNOSIS — Z8546 Personal history of malignant neoplasm of prostate: Secondary | ICD-10-CM | POA: Insufficient documentation

## 2018-04-12 DIAGNOSIS — C679 Malignant neoplasm of bladder, unspecified: Secondary | ICD-10-CM

## 2018-04-12 DIAGNOSIS — Z9981 Dependence on supplemental oxygen: Secondary | ICD-10-CM

## 2018-04-12 DIAGNOSIS — K219 Gastro-esophageal reflux disease without esophagitis: Secondary | ICD-10-CM | POA: Diagnosis not present

## 2018-04-12 DIAGNOSIS — Z87891 Personal history of nicotine dependence: Secondary | ICD-10-CM

## 2018-04-12 DIAGNOSIS — R634 Abnormal weight loss: Secondary | ICD-10-CM | POA: Diagnosis not present

## 2018-04-12 DIAGNOSIS — Z79899 Other long term (current) drug therapy: Secondary | ICD-10-CM | POA: Insufficient documentation

## 2018-04-12 DIAGNOSIS — J449 Chronic obstructive pulmonary disease, unspecified: Secondary | ICD-10-CM | POA: Insufficient documentation

## 2018-04-12 DIAGNOSIS — E785 Hyperlipidemia, unspecified: Secondary | ICD-10-CM

## 2018-04-12 DIAGNOSIS — R531 Weakness: Secondary | ICD-10-CM | POA: Diagnosis not present

## 2018-04-12 DIAGNOSIS — Z923 Personal history of irradiation: Secondary | ICD-10-CM

## 2018-04-12 DIAGNOSIS — Z515 Encounter for palliative care: Secondary | ICD-10-CM

## 2018-04-12 DIAGNOSIS — D649 Anemia, unspecified: Secondary | ICD-10-CM | POA: Insufficient documentation

## 2018-04-12 DIAGNOSIS — M069 Rheumatoid arthritis, unspecified: Secondary | ICD-10-CM | POA: Diagnosis not present

## 2018-04-12 DIAGNOSIS — Z8673 Personal history of transient ischemic attack (TIA), and cerebral infarction without residual deficits: Secondary | ICD-10-CM | POA: Insufficient documentation

## 2018-04-12 DIAGNOSIS — Z5112 Encounter for antineoplastic immunotherapy: Secondary | ICD-10-CM | POA: Insufficient documentation

## 2018-04-12 LAB — COMPREHENSIVE METABOLIC PANEL
ALT: 12 U/L (ref 0–44)
ANION GAP: 8 (ref 5–15)
AST: 20 U/L (ref 15–41)
Albumin: 2.4 g/dL — ABNORMAL LOW (ref 3.5–5.0)
Alkaline Phosphatase: 72 U/L (ref 38–126)
BUN: 26 mg/dL — ABNORMAL HIGH (ref 8–23)
CO2: 24 mmol/L (ref 22–32)
Calcium: 8.7 mg/dL — ABNORMAL LOW (ref 8.9–10.3)
Chloride: 106 mmol/L (ref 98–111)
Creatinine, Ser: 1.18 mg/dL (ref 0.61–1.24)
GFR calc Af Amer: >60 mL/min (ref >60)
GFR calc non Af Amer: 58 mL/min — ABNORMAL LOW (ref >60)
Glucose, Bld: 119 mg/dL — ABNORMAL HIGH (ref 70–99)
Potassium: 4.2 mmol/L (ref 3.5–5.1)
Sodium: 138 mmol/L (ref 135–145)
Total Bilirubin: 0.3 mg/dL (ref 0.3–1.2)
Total Protein: 8.9 g/dL — ABNORMAL HIGH (ref 6.5–8.1)

## 2018-04-12 LAB — CBC WITH DIFFERENTIAL/PLATELET
Abs Immature Granulocytes: 0.03 10*3/uL (ref 0.00–0.07)
BASOS PCT: 1 %
Basophils Absolute: 0 10*3/uL (ref 0.0–0.1)
Eosinophils Absolute: 0.2 10*3/uL (ref 0.0–0.5)
Eosinophils Relative: 3 %
HCT: 32.9 % — ABNORMAL LOW (ref 39.0–52.0)
Hemoglobin: 9.8 g/dL — ABNORMAL LOW (ref 13.0–17.0)
Immature Granulocytes: 1 %
Lymphocytes Relative: 34 %
Lymphs Abs: 2 10*3/uL (ref 0.7–4.0)
MCH: 27.2 pg (ref 26.0–34.0)
MCHC: 29.8 g/dL — ABNORMAL LOW (ref 30.0–36.0)
MCV: 91.4 fL (ref 80.0–100.0)
Monocytes Absolute: 0.4 10*3/uL (ref 0.1–1.0)
Monocytes Relative: 7 %
Neutro Abs: 3.2 10*3/uL (ref 1.7–7.7)
Neutrophils Relative %: 54 %
PLATELETS: 348 10*3/uL (ref 150–400)
RBC: 3.6 MIL/uL — ABNORMAL LOW (ref 4.22–5.81)
RDW: 19.8 % — ABNORMAL HIGH (ref 11.5–15.5)
WBC: 5.9 10*3/uL (ref 4.0–10.5)
nRBC: 0 % (ref 0.0–0.2)

## 2018-04-12 MED ORDER — SODIUM CHLORIDE 0.9 % IV SOLN
1200.0000 mg | Freq: Once | INTRAVENOUS | Status: AC
  Administered 2018-04-12: 1200 mg via INTRAVENOUS
  Filled 2018-04-12: qty 20

## 2018-04-12 MED ORDER — SODIUM CHLORIDE 0.9 % IV SOLN
Freq: Once | INTRAVENOUS | Status: AC
  Administered 2018-04-12: 12:00:00 via INTRAVENOUS
  Filled 2018-04-12: qty 250

## 2018-04-12 NOTE — Progress Notes (Signed)
Patient is here today to follow up on his Urothelial carcinoma of bladder. Patient stated that he had been doing well. Patient sounded SOB and he stated that he has COPD and wears oxygen on a needed basis.

## 2018-04-12 NOTE — Progress Notes (Signed)
[place]  [phone] [phone]) [phone]   Name: Collin Gonzales Date: 04/12/2018 MRN: [id number]  DOB: 1936/01/24  Patient Care Team: [name], MD as PCP - General (Family Medicine) [name], MD as Referring Physician (Ophthalmology) [name], MD as Consulting Physician (Rheumatology) [name], MD as Referring Physician (Specialist) [name], MD as Consulting Physician (Urology) [name], MD as Referring Physician (Radiation Oncology)    REASON FOR CONSULTATION: Palliative Care consult requested for this 83 y.o. male with multiple medical problems including locally advanced high-grade urothelial carcinoma.  PMH also notable for O2 dependent COPD, PET positive lung mass status post SBRT, prostate cancer status post brachii therapy seed implant, history of CVA.  Patient is felt not to be a surgical candidate for his urothelial carcinoma and treatment options are felt to be somewhat limited based on frailty.  He was referred to palliative care to help address goals.   SOCIAL HISTORY:     reports that he quit smoking about 5 years ago. His smoking use included cigarettes. He has a 25.00 pack-year smoking history. He has never used smokeless tobacco. He reports that he does not drink alcohol or use drugs.   Patient is divorced.  He lives at home alone.  He has multiple children who live throughout the state.  Patient used to work in [employer].  ADVANCE DIRECTIVES:  Does not have  CODE STATUS:   PAST MEDICAL HISTORY: Past Medical History:  Diagnosis Date  . Arthritis   . Collagen vascular disease (HCC)    rheumatoid arthritis  . COPD (chronic obstructive pulmonary disease) ([place])   . GERD (gastroesophageal reflux disease)   . History of prostate cancer   . HLD (hyperlipidemia)   . HOH (hard of hearing)   . Insomnia   . Lung cancer ([place]) 2017   rad tx's.  .  Oxygen deficit    2L HS AND PRN  . Prostate cancer ([place]) 2006   Rad seed tx's.  . Stroke ([place])    2000    PAST SURGICAL HISTORY:  Past Surgical History:  Procedure Laterality Date  . APPENDECTOMY  1965  . CATARACT EXTRACTION W/PHACO Right 01/05/2016   Procedure: CATARACT EXTRACTION PHACO AND INTRAOCULAR LENS PLACEMENT (IOC);  Surgeon: [name], MD;  Location: [facility] ORS;  Service: Ophthalmology;  Laterality: Right;  [place] 1.14 AP% 18.1 CDE 13.44 FLUID PACK LOT # [id number] H  . CYSTOSCOPY W/ RETROGRADES Bilateral 11/01/2017   Procedure: CYSTOSCOPY WITH RETROGRADE PYELOGRAM;  Surgeon: Hollice Espy, MD;  Location: [facility] OR[facility];  Service: Urology;  Laterality: Bilateral;  . ENDOBRONCHIAL ULTRASOUND N/A 02/26/2015   Procedure: ENDOBRONCHIAL ULTRASOUND;  Surgeon: Flora Lipps, MD;  Location: [facility];  Service: Cardiopulmonary;  Laterality: N/A;  . [place]  . INSERTION PROSTATE RADIATION SEED  2002  . TRANSURETHRAL RESECTION OF PROSTATE N/A 11/01/2017   Procedure: TRANSURETHRAL RESECTION OF THE PROSTATE (TURP) (channel TURP);  Surgeon: Hollice Espy, MD;  Location: [facility] ORS;  Service: Urology;  Laterality: N/A;    HEMATOLOGY/ONCOLOGY HISTORY:    Urothelial carcinoma of bladder ([place])   11/12/2017 Initial Diagnosis    Urothelial carcinoma of bladder ([place])    11/23/2017 -  Chemotherapy    The patient had atezolizumab (TECENTRIQ) 1,200 mg in sodium chloride 0.9 % 250 mL chemo infusion, 1,200 mg, Intravenous, Once, 6 of 12 cycles Administration: 1,200 mg (11/23/2017), 1,200 mg (12/14/2017), 1,200 mg (01/04/2018), 1,200 mg (01/25/2018), 1,200 mg (  03/22/2018), 1,200 mg (02/28/2018)  for chemotherapy treatment.      ALLERGIES:  is allergic to plaquenil [hydroxychloroquine] and simvastatin.  MEDICATIONS:  Current Outpatient Medications  Medication Sig Dispense Refill  . finasteride (PROSCAR) 5 MG tablet Take 5 mg by mouth daily.  11  . Fluticasone-Umeclidin-Vilant (TRELEGY ELLIPTA)  100-62.5-25 MCG/INH AEPB Inhale 1 puff into the lungs daily. 60 each 5  . folic acid (FOLVITE) 1 MG tablet Take 1 tablet (1 mg total) by mouth daily. 30 tablet 5  . megestrol (MEGACE) 20 MG tablet Take 1 tablet (20 mg total) by mouth 2 (two) times daily. 180 tablet 1  . methotrexate (RHEUMATREX) 2.5 MG tablet Take 5 tablets by mouth once a week.    . OXYGEN Place 2 L into the nose daily as needed.     . prochlorperazine (COMPAZINE) 10 MG tablet Take 1 tablet (10 mg total) by mouth every 6 (six) hours as needed (Nausea or vomiting). 60 tablet 2  . QUEtiapine (SEROQUEL) 50 MG tablet Take 1 tablet (50 mg total) by mouth at bedtime. 90 tablet 0  . tamsulosin (FLOMAX) 0.4 MG CAPS capsule Take 1 capsule (0.4 mg total) by mouth daily. (Patient taking differently: Take 0.8 mg by mouth daily. ) 30 capsule 11   No current facility-administered medications for this visit.     VITAL SIGNS: There were no vitals taken for this visit. There were no vitals filed for this visit.  Estimated body mass index is 17.75 kg/m as calculated from the following:   Height as of an earlier encounter on 04/12/18: _0  (1.676 m).   Weight as of an earlier encounter on 04/12/18: 110 lb (49.9 kg).  LABS: CBC:    Component Value Date/Time   WBC 5.9 04/12/2018 1048   HGB 9.8 (L) 04/12/2018 1048   HGB 12.3 (L) 11/21/2014 1010   HCT 32.9 (L) 04/12/2018 1048   HCT 39.3 11/21/2014 1010   PLT 348 04/12/2018 1048   PLT 387 (H) 11/21/2014 1010   MCV 91.4 04/12/2018 1048   MCV 95 11/21/2014 1010   MCV 95 01/05/2014 0429   NEUTROABS 3.2 04/12/2018 1048   NEUTROABS 3.2 11/21/2014 1010   NEUTROABS 5.3 01/05/2014 0429   LYMPHSABS 2.0 04/12/2018 1048   LYMPHSABS 1.7 11/21/2014 1010   LYMPHSABS 0.4 (L) 01/05/2014 0429   MONOABS 0.4 04/12/2018 1048   MONOABS 0.2 01/05/2014 0429   EOSABS 0.2 04/12/2018 1048   EOSABS 0.1 11/21/2014 1010   EOSABS 0.0 01/05/2014 0429   BASOSABS 0.0 04/12/2018 1048   BASOSABS 0.0 11/21/2014  1010   BASOSABS 0.0 01/05/2014 0429   Comprehensive Metabolic Panel:    Component Value Date/Time   NA 138 04/12/2018 1048   NA 139 12/08/2014 1004   NA 138 01/04/2014 0430   K 4.2 04/12/2018 1048   K 4.4 01/04/2014 0430   CL 106 04/12/2018 1048   CL 102 01/04/2014 0430   CO2 24 04/12/2018 1048   CO2 30 01/04/2014 0430   BUN 26 (H) 04/12/2018 1048   BUN 15 12/08/2014 1004   BUN 17 01/04/2014 0430   CREATININE 1.18 04/12/2018 1048   CREATININE 1.17 10/11/2017 1239   GLUCOSE 119 (H) 04/12/2018 1048   GLUCOSE 121 (H) 01/04/2014 0430   CALCIUM 8.7 (L) 04/12/2018 1048   CALCIUM 8.4 (L) 01/04/2014 0430   AST 20 04/12/2018 1048   ALT 12 04/12/2018 1048   ALKPHOS 72 04/12/2018 1048   BILITOT 0.3 04/12/2018 1048   BILITOT  0.3 12/08/2014 1004   PROT 8.9 (H) 04/12/2018 1048   PROT 8.6 (H) 12/08/2014 1004   ALBUMIN 2.4 (L) 04/12/2018 1048   ALBUMIN 3.7 12/08/2014 1004    RADIOGRAPHIC STUDIES: Ct Pelvis Wo Contrast  Result Date: 03/26/2018 CLINICAL DATA:  Urothelial carcinoma involving the prostate status post TURP 11/01/2017. Bladder outlet obstruction requiring Foley catheter placement. Stool in urine. Clinical concern for colovesical fistula. Remote history of prostate cancer status post brachytherapy. EXAM: CT PELVIS WITHOUT CONTRAST TECHNIQUE: Multidetector CT imaging of the pelvis was performed following the standard protocol without intravenous contrast. COMPARISON:  11/22/2017 PET-CT.  11/14/2017 CT abdomen/pelvis. FINDINGS: Urinary Tract: Bladder is collapsed and poorly evaluated on this scan. Small amount of nonspecific gas is scattered within the collapsed bladder. Bowel: Evaluation of the bowel is limited by absence of enteric and IV contrast. There is apparent new circumferential wall thickening in the nondistended lower rectum (series 2/image 39). Mild sigmoid diverticulosis, with no evidence of sigmoid colon wall thickening. No dilated or thick-walled pelvic small bowel loops.  Moderate to large amount of stool throughout the visualized colon. Vascular/Lymphatic: Atherosclerotic nonaneurysmal visualized abdominal aorta. Bilateral iliofemoral atherosclerosis. No pathologically enlarged lymph nodes in the pelvis. Reproductive: Status post interval TURP. Foley catheter balloon is dilated within the TURP defect. Tip of Foley catheter is located within the prostatic urethra near the junction with the bladder. A few brachytherapy seeds are still visualized at the periphery of the remnant prostate. A tiny focus of gas is present at the interface of the remnant prostate and rectum in the midline (series 2/image 39), difficult to exclude a rectal-prostatic fistula. Other: No focal fluid collections.  No pelvic ascites. Musculoskeletal: No aggressive appearing focal osseous lesions. Marked lower lumbar spondylosis. IMPRESSION: 1. Very limited scan due to the absence of enteric and IV contrast. 2. Foley catheter is low lying with the Foley balloon dilated within the TURP defect and with tip of Foley catheter within the prostatic urethra near the junction with the bladder. Bladder is decompressed and poorly evaluated on this scan. 3. Apparent new circumferential wall thickening in the nondistended lower rectum, cannot exclude proctitis. 4. Nonspecific tiny focus of gas in the midline at the interface of the remnant prostate and rectum, difficult to exclude a rectal-prostatic fistula. Consider further evaluation with CT pelvis with IV contrast after instillation of rectal contrast via a rectal catheter. 5. Moderate to large colonic stool volume, suggesting constipation. Electronically Signed   By: Ilona Sorrel M.D.   On: 03/26/2018 16:44    PERFORMANCE STATUS (ECOG) : 0 - Asymptomatic  Review of Systems As noted above. Otherwise, a complete review of systems is negative.  Physical Exam General: NAD, frail appearing, thin Cardiovascular: regular rate and rhythm Pulmonary: clear ant  fields Abdomen: soft, nontender, + bowel sounds Extremities: no edema, no joint deformities Skin: no rashes Neurological: Weakness but otherwise nonfocal  IMPRESSION: Met with patient today while he was receiving an infusion.  I introduced palliative care services and attempted to establish therapeutic rapport.  Patient says he lives at home alone.  He reports being functionally independent and still drives.  He describes having a good support system with his children being available if he needed them.  Patient denies any acute or distressing symptoms.  I do note that he has had some weight loss over the last several months.  His weight was around 117 pounds in November and is down to 110 pounds today.  He reports drinking one Ensure a day.  I recommend that he increase that to 2 or 3 times a day.  Also make referral to RD.  We will meet with patient when he returns to see oncology.  I have to speak with him privately in the clinic, which would allow a more thorough discussion regarding his goals and advance care planning.  PLAN: Continue current scope of treatment Referral to RD RTC in 2-3 weeks   Patient expressed understanding and was in agreement with this plan. He also understands that He can call clinic at any time with any questions, concerns, or complaints.     Time Total: 20 minutes  Visit consisted of counseling and education dealing with the complex and emotionally intense issues of symptom management and palliative care in the setting of serious and potentially life-threatening illness.Greater than 50%  of this time was spent counseling and coordinating care related to the above assessment and plan.  Signed by: Altha Harm, PhD, NP-C [phone] (Work Cell)

## 2018-04-13 LAB — THYROID PANEL WITH TSH
Free Thyroxine Index: 2.3 (ref 1.2–4.9)
T3 Uptake Ratio: 28 % (ref 24–39)
T4, Total: 8.2 ug/dL (ref 4.5–12.0)
TSH: 1.94 u[IU]/mL (ref 0.450–4.500)

## 2018-04-18 ENCOUNTER — Ambulatory Visit: Payer: Medicare Other | Admitting: Urology

## 2018-04-18 NOTE — Progress Notes (Incomplete)
04/18/2018  1:06 PM   [place] 06/28/1935 [id number]  Referring provider: [name], MD [address] [place],  [address]  No chief complaint on file.   HPI: Collin Gonzales is a 83 yo African American M who presents today for a symptom recheck after    PMH: Past Medical History:  Diagnosis Date   Arthritis    Collagen vascular disease (HCC)    rheumatoid arthritis   COPD (chronic obstructive pulmonary disease) (HCC)    GERD (gastroesophageal reflux disease)    History of prostate cancer    HLD (hyperlipidemia)    HOH (hard of hearing)    Insomnia    Lung cancer ([place]) 2017   rad tx's.   Oxygen deficit    2L HS AND PRN   Prostate cancer ([place]) 2006   Rad seed tx's.   Stroke [facility])    2000    Surgical History: Past Surgical History:  Procedure Laterality Date   APPENDECTOMY  1965   CATARACT EXTRACTION W/PHACO Right 01/05/2016   Procedure: CATARACT EXTRACTION PHACO AND INTRAOCULAR LENS PLACEMENT (IOC);  Surgeon: [name], MD;  Location: [facility] ORS;  Service: Ophthalmology;  Laterality: Right;  [place] 1.14 AP% 18.1 CDE 13.44 FLUID PACK LOT # [id number] H   CYSTOSCOPY W/ RETROGRADES Bilateral 11/01/2017   Procedure: CYSTOSCOPY WITH RETROGRADE PYELOGRAM;  Surgeon: Hollice Espy, MD;  Location: [facility] ORS;  Service: Urology;  Laterality: Bilateral;   ENDOBRONCHIAL ULTRASOUND N/A 02/26/2015   Procedure: ENDOBRONCHIAL ULTRASOUND;  Surgeon: Flora Lipps, MD;  Location: [facility] ORS;  Service: Cardiopulmonary;  Laterality: N/A;   HERNIA REPAIR  1965   INSERTION PROSTATE RADIATION SEED  2002   TRANSURETHRAL RESECTION OF PROSTATE N/A 11/01/2017   Procedure: TRANSURETHRAL RESECTION OF THE PROSTATE (TURP) (channel TURP);  Surgeon: Hollice Espy, MD;  Location: [facility] ORS;  Service: Urology;  Laterality: N/A;    Home Medications:  Allergies as of 04/18/2018      Reactions   Plaquenil [hydroxychloroquine]    Bad dreams   Simvastatin Other (See Comments)   Bad dreams      Medication List       Accurate as of April 18, 2018  1:06 PM. Always use your most recent med list.        finasteride 5 MG tablet Commonly known as:  PROSCAR Take 5 mg by mouth daily.   Fluticasone-Umeclidin-Vilant 100-62.5-25 MCG/INH Aepb Commonly known as:  TRELEGY ELLIPTA Inhale 1 puff into the lungs daily.   folic acid 1 MG tablet Commonly known as:  FOLVITE Take 1 tablet (1 mg total) by mouth daily.   megestrol 20 MG tablet Commonly known as:  MEGACE Take 1 tablet (20 mg total) by mouth 2 (two) times daily.   methotrexate 2.5 MG tablet Commonly known as:  RHEUMATREX Take 5 tablets by mouth once a week.   OXYGEN Place 2 L into the nose daily as needed.   prochlorperazine 10 MG tablet Commonly known as:  COMPAZINE Take 1 tablet (10 mg total) by mouth every 6 (six) hours as needed (Nausea or vomiting).   QUEtiapine 50 MG tablet Commonly known as:  SEROQUEL Take 1 tablet (50 mg total) by mouth at bedtime.   tamsulosin 0.4 MG Caps capsule Commonly known as:  FLOMAX Take 1 capsule (0.4 mg total) by mouth daily.       Allergies:  Allergies  Allergen Reactions   Plaquenil [Hydroxychloroquine]     Bad dreams   Simvastatin Other (See Comments)  Bad dreams    Family History: Family History  Problem Relation Age of Onset   Cancer Mother    Cancer Father        Lung Cancer   Cancer Sister        breast   Cancer Brother        stomach    Social History:  reports that he quit smoking about 5 years ago. His smoking use included cigarettes. He has a 25.00 pack-year smoking history. He has never used smokeless tobacco. He reports that he does not drink alcohol or use drugs.  ROS:                                        Physical Exam: There were no vitals taken for this visit.  Constitutional:  Alert and oriented, No acute distress. HEENT: [place] AT, moist mucus membranes.   Trachea midline, no masses. Cardiovascular: No clubbing, cyanosis, or edema. Respiratory: Normal respiratory effort, no increased work of breathing. GI: Abdomen is soft, nontender, nondistended, no abdominal masses GU: No CVA tenderness Lymph: No cervical or inguinal lymphadenopathy. Skin: No rashes, bruises or suspicious lesions. Neurologic: Grossly intact, no focal deficits, moving all 4 extremities. Psychiatric: Normal mood and affect.  Laboratory Data: Lab Results  Component Value Date   WBC 5.9 04/12/2018   HGB 9.8 (L) 04/12/2018   HCT 32.9 (L) 04/12/2018   MCV 91.4 04/12/2018   PLT 348 04/12/2018    Lab Results  Component Value Date   CREATININE 1.18 04/12/2018    No results found for: PSA  No results found for: TESTOSTERONE  No results found for: HGBA1C  Urinalysis    Component Value Date/Time   COLORURINE YELLOW (A) 03/01/2017 2243   APPEARANCEUR Cloudy (A) 02/02/2018 1145   LABSPEC 1.019 03/01/2017 2243   PHURINE 5.0 03/01/2017 2243   GLUCOSEU Negative 02/02/2018 1145   HGBUR MODERATE (A) 03/01/2017 2243   BILIRUBINUR Negative 02/02/2018 1145   KETONESUR 5 (A) 03/01/2017 2243   PROTEINUR 3+ (A) 02/02/2018 1145   PROTEINUR 30 (A) 03/01/2017 2243   UROBILINOGEN 0.2 03/21/2017 1431   NITRITE Negative 02/02/2018 1145   NITRITE NEGATIVE 03/01/2017 2243   LEUKOCYTESUR 1+ (A) 02/02/2018 1145    Lab Results  Component Value Date   LABMICR See below: 02/02/2018   WBCUA 11-30 (A) 02/02/2018   RBCUA 3-10 (A) 02/02/2018   LABEPIT 0-10 02/02/2018   MUCUS Present (A) 02/02/2018   BACTERIA Many (A) 02/02/2018    Pertinent Imaging: *** No results found for this or any previous visit. No results found for this or any previous visit. No results found for this or any previous visit. No results found for this or any previous visit. No results found for this or any previous visit. No results found for this or any previous visit. No results found for this or  any previous visit. No results found for this or any previous visit.  Assessment & Plan:     1.  Locally advanced urothelial carcinoma By Dr. 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 [name] yesterday who will see the patient in the near future however likely few alternatives I did have a frank discussion again today with the patient his family about his overall  prognosis, poor Recommend consideration of additional palliative care options versus hospice Dr. [name] will further this discussion  2. Foley Catheter Removal - CT revealed the foley low lying within the TURP defect with the tip of catheter within prostatic urethra near junction within the bladder.  Bladder decompressed and poorly evaluated. - Catheter was removed yesterday (03/27/2018) -He was to be voiding spontaneously at this point in time without difficulty thus we will leave the Foley catheter out for the time being especially in the setting of difficult placement, likely secondary to bladder neck contracture/fibrosis  3. Constipation -Responded well to aggressive bowel regimen, mag citrate -Recommend addition of Colace twice daily for maintenance  4.  Rectorostatic fistula Was discussed briefly with Dr. Adora Fridge yesterday, any surgical intervention would be palliative in likely not well tolerated given his medical comorbidities Patient is not interested in diverted at this point in time and is now doing well with Foley/bowel regimen Continue supportive care Refer to general surgery if Collin Gonzales changes his mind  No follow-ups on file.  [facility] [address], [place], [place] [address] [phone]  I, Lucas Mallow, am acting as a scribe for Dr. Hollice Espy,  {Add Scribe Attestation Statement}

## 2018-04-25 ENCOUNTER — Encounter: Payer: Self-pay | Admitting: Urology

## 2018-04-25 ENCOUNTER — Ambulatory Visit (INDEPENDENT_AMBULATORY_CARE_PROVIDER_SITE_OTHER): Payer: Medicare Other | Admitting: Urology

## 2018-04-25 VITALS — BP 89/42 | HR 91 | Ht 68.0 in | Wt 109.0 lb

## 2018-04-25 DIAGNOSIS — R339 Retention of urine, unspecified: Secondary | ICD-10-CM | POA: Diagnosis not present

## 2018-04-25 DIAGNOSIS — N4289 Other specified disorders of prostate: Secondary | ICD-10-CM

## 2018-04-25 DIAGNOSIS — C675 Malignant neoplasm of bladder neck: Secondary | ICD-10-CM

## 2018-04-25 LAB — BLADDER SCAN AMB NON-IMAGING

## 2018-05-03 ENCOUNTER — Inpatient Hospital Stay: Payer: Medicare Other | Admitting: Hospice and Palliative Medicine

## 2018-05-03 ENCOUNTER — Inpatient Hospital Stay: Payer: Medicare Other

## 2018-05-03 ENCOUNTER — Inpatient Hospital Stay: Payer: Medicare Other | Admitting: Oncology

## 2018-05-07 DIAGNOSIS — J449 Chronic obstructive pulmonary disease, unspecified: Secondary | ICD-10-CM | POA: Diagnosis not present

## 2018-05-09 ENCOUNTER — Other Ambulatory Visit: Payer: Self-pay

## 2018-05-09 ENCOUNTER — Inpatient Hospital Stay: Payer: Medicare Other | Attending: Oncology

## 2018-05-09 ENCOUNTER — Inpatient Hospital Stay (HOSPITAL_BASED_OUTPATIENT_CLINIC_OR_DEPARTMENT_OTHER): Payer: Medicare Other | Admitting: Hospice and Palliative Medicine

## 2018-05-09 ENCOUNTER — Inpatient Hospital Stay (HOSPITAL_BASED_OUTPATIENT_CLINIC_OR_DEPARTMENT_OTHER): Payer: Medicare Other | Admitting: Oncology

## 2018-05-09 ENCOUNTER — Encounter: Payer: Self-pay | Admitting: Oncology

## 2018-05-09 ENCOUNTER — Inpatient Hospital Stay: Payer: Medicare Other

## 2018-05-09 VITALS — BP 101/57 | HR 95 | Temp 96.9°F | Resp 18 | Wt 108.6 lb

## 2018-05-09 DIAGNOSIS — R918 Other nonspecific abnormal finding of lung field: Secondary | ICD-10-CM | POA: Insufficient documentation

## 2018-05-09 DIAGNOSIS — J449 Chronic obstructive pulmonary disease, unspecified: Secondary | ICD-10-CM

## 2018-05-09 DIAGNOSIS — K219 Gastro-esophageal reflux disease without esophagitis: Secondary | ICD-10-CM | POA: Insufficient documentation

## 2018-05-09 DIAGNOSIS — C679 Malignant neoplasm of bladder, unspecified: Secondary | ICD-10-CM

## 2018-05-09 DIAGNOSIS — G893 Neoplasm related pain (acute) (chronic): Secondary | ICD-10-CM

## 2018-05-09 DIAGNOSIS — Z9221 Personal history of antineoplastic chemotherapy: Secondary | ICD-10-CM

## 2018-05-09 DIAGNOSIS — M069 Rheumatoid arthritis, unspecified: Secondary | ICD-10-CM

## 2018-05-09 DIAGNOSIS — Z8673 Personal history of transient ischemic attack (TIA), and cerebral infarction without residual deficits: Secondary | ICD-10-CM | POA: Diagnosis not present

## 2018-05-09 DIAGNOSIS — Z9981 Dependence on supplemental oxygen: Secondary | ICD-10-CM

## 2018-05-09 DIAGNOSIS — Z801 Family history of malignant neoplasm of trachea, bronchus and lung: Secondary | ICD-10-CM

## 2018-05-09 DIAGNOSIS — D649 Anemia, unspecified: Secondary | ICD-10-CM | POA: Insufficient documentation

## 2018-05-09 DIAGNOSIS — Z515 Encounter for palliative care: Secondary | ICD-10-CM

## 2018-05-09 DIAGNOSIS — Z800 Family history of malignant neoplasm of digestive organs: Secondary | ICD-10-CM

## 2018-05-09 DIAGNOSIS — Z79899 Other long term (current) drug therapy: Secondary | ICD-10-CM | POA: Diagnosis not present

## 2018-05-09 DIAGNOSIS — E785 Hyperlipidemia, unspecified: Secondary | ICD-10-CM | POA: Insufficient documentation

## 2018-05-09 DIAGNOSIS — Z87891 Personal history of nicotine dependence: Secondary | ICD-10-CM | POA: Insufficient documentation

## 2018-05-09 DIAGNOSIS — Z85118 Personal history of other malignant neoplasm of bronchus and lung: Secondary | ICD-10-CM

## 2018-05-09 DIAGNOSIS — Z8546 Personal history of malignant neoplasm of prostate: Secondary | ICD-10-CM | POA: Diagnosis not present

## 2018-05-09 DIAGNOSIS — Z923 Personal history of irradiation: Secondary | ICD-10-CM

## 2018-05-09 DIAGNOSIS — Z7189 Other specified counseling: Secondary | ICD-10-CM

## 2018-05-09 LAB — CBC WITH DIFFERENTIAL/PLATELET
ABS IMMATURE GRANULOCYTES: 0.02 10*3/uL (ref 0.00–0.07)
Basophils Absolute: 0 10*3/uL (ref 0.0–0.1)
Basophils Relative: 0 %
Eosinophils Absolute: 0.2 10*3/uL (ref 0.0–0.5)
Eosinophils Relative: 3 %
HCT: 28.8 % — ABNORMAL LOW (ref 39.0–52.0)
Hemoglobin: 8.5 g/dL — ABNORMAL LOW (ref 13.0–17.0)
Immature Granulocytes: 0 %
Lymphocytes Relative: 19 %
Lymphs Abs: 1.4 10*3/uL (ref 0.7–4.0)
MCH: 27.3 pg (ref 26.0–34.0)
MCHC: 29.5 g/dL — ABNORMAL LOW (ref 30.0–36.0)
MCV: 92.6 fL (ref 80.0–100.0)
Monocytes Absolute: 0.1 10*3/uL (ref 0.1–1.0)
Monocytes Relative: 2 %
NEUTROS ABS: 5.7 10*3/uL (ref 1.7–7.7)
NEUTROS PCT: 76 %
NRBC: 0 % (ref 0.0–0.2)
Platelets: 410 10*3/uL — ABNORMAL HIGH (ref 150–400)
RBC: 3.11 MIL/uL — ABNORMAL LOW (ref 4.22–5.81)
RDW: 18.5 % — ABNORMAL HIGH (ref 11.5–15.5)
WBC: 7.4 10*3/uL (ref 4.0–10.5)

## 2018-05-09 LAB — COMPREHENSIVE METABOLIC PANEL
ALT: 16 U/L (ref 0–44)
AST: 23 U/L (ref 15–41)
Albumin: 2.2 g/dL — ABNORMAL LOW (ref 3.5–5.0)
Alkaline Phosphatase: 62 U/L (ref 38–126)
Anion gap: 6 (ref 5–15)
BILIRUBIN TOTAL: 0.4 mg/dL (ref 0.3–1.2)
BUN: 22 mg/dL (ref 8–23)
CO2: 22 mmol/L (ref 22–32)
Calcium: 8.4 mg/dL — ABNORMAL LOW (ref 8.9–10.3)
Chloride: 109 mmol/L (ref 98–111)
Creatinine, Ser: 0.99 mg/dL (ref 0.61–1.24)
GFR calc Af Amer: >60 mL/min (ref >60)
GFR calc non Af Amer: >60 mL/min (ref >60)
Glucose, Bld: 169 mg/dL — ABNORMAL HIGH (ref 70–99)
Potassium: 3.5 mmol/L (ref 3.5–5.1)
Sodium: 137 mmol/L (ref 135–145)
Total Protein: 8.3 g/dL — ABNORMAL HIGH (ref 6.5–8.1)

## 2018-05-09 MED ORDER — HYDROCODONE-ACETAMINOPHEN 5-325 MG PO TABS: 1.0000 | ORAL_TABLET | Freq: Four times a day (QID) | ORAL | 0 refills | Status: DC | PRN

## 2018-05-09 NOTE — Progress Notes (Signed)
Patient here for follow up. No concerns voiced.  °

## 2018-05-09 NOTE — Progress Notes (Signed)
[place]  [phone] [phone]   Name: Collin Gonzales Date: 05/09/2018 MRN: [id number]  DOB: 25-Mar-1935  Patient Care Team: [name], MD as PCP - General (Family Medicine) [name], MD as Referring Physician (Ophthalmology) [name], MD as Consulting Physician (Rheumatology) [name], MD as Referring Physician (Specialist) [name], MD as Consulting Physician (Urology) [name], MD as Referring Physician (Radiation Oncology)    REASON FOR CONSULTATION: Palliative Care consult requested for this 83 y.o. male with multiple medical problems including locally advanced high-grade urothelial carcinoma.  PMH also notable for O2 dependent COPD, PET positive lung mass status post SBRT, prostate cancer status post brachii therapy seed implant, history of CVA.  Patient is felt not to be a surgical candidate for his urothelial carcinoma and treatment options are felt to be somewhat limited based on frailty.  He was referred to palliative care to help address goals.   SOCIAL HISTORY:     reports that he quit smoking about 5 years ago. His smoking use included cigarettes. He has a 25.00 pack-year smoking history. He has never used smokeless tobacco. He reports that he does not drink alcohol or use drugs.   Patient is divorced.  He lives at home alone.  He has multiple children who live throughout the state.  Patient used to work in [employer].  ADVANCE DIRECTIVES:  Does not have  CODE STATUS:   PAST MEDICAL HISTORY: Past Medical History:  Diagnosis Date  . Arthritis   . Collagen vascular disease (HCC)    rheumatoid arthritis  . COPD (chronic obstructive pulmonary disease) ([place])   . GERD (gastroesophageal reflux disease)   . History of prostate cancer   . HLD (hyperlipidemia)   . HOH (hard of hearing)   . Insomnia   . Lung cancer ([place]) 2017   rad tx's.  .  Oxygen deficit    2L HS AND PRN  . Prostate cancer ([place]) 2006   Rad seed tx's.  . Stroke ([place])    2000    PAST SURGICAL HISTORY:  Past Surgical History:  Procedure Laterality Date  . APPENDECTOMY  1965  . CATARACT EXTRACTION W/PHACO Right 01/05/2016   Procedure: CATARACT EXTRACTION PHACO AND INTRAOCULAR LENS PLACEMENT (IOC);  Surgeon: [name], MD;  Location: [facility] ORS;  Service: Ophthalmology;  Laterality: Right;  [place] 1.14 AP% 18.1 CDE 13.44 FLUID PACK LOT # [id number] H  . CYSTOSCOPY W/ RETROGRADES Bilateral 11/01/2017   Procedure: CYSTOSCOPY WITH RETROGRADE PYELOGRAM;  Surgeon: Hollice Espy, MD;  Location: [facility] ORS;  Service: Urology;  Laterality: Bilateral;  . ENDOBRONCHIAL ULTRASOUND N/A 02/26/2015   Procedure: ENDOBRONCHIAL ULTRASOUND;  Surgeon: Flora Lipps, MD;  Location: [facility] OR[facility];  Service: Cardiopulmonary;  Laterality: N/A;  . [place]  . INSERTION PROSTATE RADIATION SEED  2002  . TRANSURETHRAL RESECTION OF PROSTATE N/A 11/01/2017   Procedure: TRANSURETHRAL RESECTION OF THE PROSTATE (TURP) (channel TURP);  Surgeon: Hollice Espy, MD;  Location: [facility] ORS;  Service: Urology;  Laterality: N/A;    HEMATOLOGY/ONCOLOGY HISTORY:    Urothelial carcinoma of bladder ([place])   11/12/2017 Initial Diagnosis    Urothelial carcinoma of bladder ([place])    11/23/2017 -  Chemotherapy    The patient had atezolizumab (TECENTRIQ) 1,200 mg in sodium chloride 0.9 % 250 mL chemo infusion, 1,200 mg, Intravenous, Once, 7 of 12 cycles Administration: 1,200 mg (11/23/2017), 1,200 mg (12/14/2017), 1,200 mg (01/04/2018), 1,200 mg (01/25/2018), 1,200 mg (  03/22/2018), 1,200 mg (02/28/2018), 1,200 mg (04/12/2018)  for chemotherapy treatment.      ALLERGIES:  is allergic to plaquenil [hydroxychloroquine] and simvastatin.  MEDICATIONS:  Current Outpatient Medications  Medication Sig Dispense Refill  . finasteride (PROSCAR) 5 MG tablet Take 5 mg by mouth daily.  11  .  Fluticasone-Umeclidin-Vilant (TRELEGY ELLIPTA) 100-62.5-25 MCG/INH AEPB Inhale 1 puff into the lungs daily. 60 each 5  . folic acid (FOLVITE) 1 MG tablet Take 1 tablet (1 mg total) by mouth daily. 30 tablet 5  . megestrol (MEGACE) 20 MG tablet Take 1 tablet (20 mg total) by mouth 2 (two) times daily. 180 tablet 1  . methotrexate (RHEUMATREX) 2.5 MG tablet Take 5 tablets by mouth once a week.    . OXYGEN Place 2 L into the nose daily as needed.     . prochlorperazine (COMPAZINE) 10 MG tablet Take 1 tablet (10 mg total) by mouth every 6 (six) hours as needed (Nausea or vomiting). (Patient not taking: Reported on 05/09/2018) 60 tablet 2  . QUEtiapine (SEROQUEL) 50 MG tablet Take 1 tablet (50 mg total) by mouth at bedtime. 90 tablet 0  . tamsulosin (FLOMAX) 0.4 MG CAPS capsule Take 1 capsule (0.4 mg total) by mouth daily. (Patient taking differently: Take 0.8 mg by mouth daily. ) 30 capsule 11   No current facility-administered medications for this visit.     VITAL SIGNS: There were no vitals taken for this visit. There were no vitals filed for this visit.  Estimated body mass index is 16.51 kg/m as calculated from the following:   Height as of 04/25/18: 5\' 8"  (1.727 m).   Weight as of an earlier encounter on 05/09/18: 108 lb 9.6 oz (49.3 kg).  LABS: CBC:    Component Value Date/Time   WBC 7.4 05/09/2018 0908   HGB 8.5 (L) 05/09/2018 0908   HGB 12.3 (L) 11/21/2014 1010   HCT 28.8 (L) 05/09/2018 0908   HCT 39.3 11/21/2014 1010   PLT 410 (H) 05/09/2018 0908   PLT 387 (H) 11/21/2014 1010   MCV 92.6 05/09/2018 0908   MCV 95 11/21/2014 1010   MCV 95 01/05/2014 0429   NEUTROABS 5.7 05/09/2018 0908   NEUTROABS 3.2 11/21/2014 1010   NEUTROABS 5.3 01/05/2014 0429   LYMPHSABS 1.4 05/09/2018 0908   LYMPHSABS 1.7 11/21/2014 1010   LYMPHSABS 0.4 (L) 01/05/2014 0429   MONOABS 0.1 05/09/2018 0908   MONOABS 0.2 01/05/2014 0429   EOSABS 0.2 05/09/2018 0908   EOSABS 0.1 11/21/2014 1010   EOSABS 0.0  01/05/2014 0429   BASOSABS 0.0 05/09/2018 0908   BASOSABS 0.0 11/21/2014 1010   BASOSABS 0.0 01/05/2014 0429   Comprehensive Metabolic Panel:    Component Value Date/Time   NA 137 05/09/2018 0908   NA 139 12/08/2014 1004   NA 138 01/04/2014 0430   K 3.5 05/09/2018 0908   K 4.4 01/04/2014 0430   CL 109 05/09/2018 0908   CL 102 01/04/2014 0430   CO2 22 05/09/2018 0908   CO2 30 01/04/2014 0430   BUN 22 05/09/2018 0908   BUN 15 12/08/2014 1004   BUN 17 01/04/2014 0430   CREATININE 0.99 05/09/2018 0908   CREATININE 1.17 10/11/2017 1239   GLUCOSE 169 (H) 05/09/2018 0908   GLUCOSE 121 (H) 01/04/2014 0430   CALCIUM 8.4 (L) 05/09/2018 0908   CALCIUM 8.4 (L) 01/04/2014 0430   AST 23 05/09/2018 0908   ALT 16 05/09/2018 0908   ALKPHOS 62 05/09/2018 0908  BILITOT 0.4 05/09/2018 0908   BILITOT 0.3 12/08/2014 1004   PROT 8.3 (H) 05/09/2018 0908   PROT 8.6 (H) 12/08/2014 1004   ALBUMIN 2.2 (L) 05/09/2018 0908   ALBUMIN 3.7 12/08/2014 1004    RADIOGRAPHIC STUDIES: No results found.  PERFORMANCE STATUS (ECOG) : 1  Review of Systems As noted above. Otherwise, a complete review of systems is negative.  Physical Exam General: NAD, frail appearing, thin Cardiovascular: regular rate and rhythm Pulmonary: labored with exertion, on O2 Abdomen: soft, nontender, + bowel sounds Extremities: no edema, no joint deformities Skin: no rashes Neurological: Weakness but otherwise nonfocal  IMPRESSION: Follow-up visit today with patient.  Patient was scheduled to receive treatment today but RNs had difficulty with obtaining IV access. Patient would like to pursue port placement.   Patient says he has had some penile/bladder pain. It is described as being sharp and more severe at night when he is trying to sleep. Pain is often 8 out of 10. He has some urinary incontinence with movement/coughing. No burning or urgency. No fevers or chills. Patient is interested in initiating analgesia and says he  has tolerated pain medication in the past but cannot recall what he took. Will start prn Norco.   I reviewed with patient ACP documents and a MOST form. Will plan to complete when he returns to the clinic.   PDMP reviewed.   Case and plan discussed with Dr. [name].   PLAN: -Continue current scope of treatment -Norco 5-325mg  Q6H prn for pain (Rx #30) -ACP/MOST form when he returns to the clinic -RTC in 2-3 weeks   Patient expressed understanding and was in agreement with this plan. He also understands that He can call clinic at any time with any questions, concerns, or complaints.     Time Total: 20 minutes  Visit consisted of counseling and education dealing with the complex and emotionally intense issues of symptom management and palliative care in the setting of serious and potentially life-threatening illness.Greater than 50%  of this time was spent counseling and coordinating care related to the above assessment and plan.  Signed by: Altha Harm, PhD, NP-C [phone] (Work Cell)

## 2018-05-09 NOTE — Progress Notes (Signed)
Pt was stuck 5 times by multiple nurses for IV placement for treatment. Pt refuses to be stuck again, MD and care team notified. Orders have been arranged for port placement. Pt verbalizes understanding and is interested in port placement.   Collin Gonzales [employer]

## 2018-05-09 NOTE — Progress Notes (Signed)
[place]  [phone] [phone]) [phone]  ID: Collin Gonzales OB: November 20, 1935  MR#: [id number]  ZYS#:[id number]  Patient Care Team: [name], MD as PCP - General (Family Medicine) [name], MD as Referring Physician (Ophthalmology) [name], MD as Consulting Physician (Rheumatology) [name], MD as Referring Physician (Specialist) [name], MD as Consulting Physician (Urology) [name], MD as Referring Physician (Radiation Oncology)  CHIEF COMPLAINT: Locally advanced high-grade urothelial carcinoma.    INTERVAL HISTORY: Patient returns to clinic today for further evaluation and consideration of cycle 8 of Tecentriq.  He continues to have pain, but otherwise feels well.  He has no neurologic complaints.  He denies any recent fevers or illnesses.  He has chronic shortness of breath, but denies any chest pain, hemoptysis, or cough.  He has no nausea, vomiting, constipation, or diarrhea.  He has no urinary complaints.  Patient offers no further specific complaints today.    REVIEW OF SYSTEMS:   Review of Systems  Constitutional: Negative.  Negative for fever, malaise/fatigue and weight loss.  Respiratory: Negative.  Negative for cough, hemoptysis and shortness of breath.   Cardiovascular: Negative.  Negative for chest pain and leg swelling.  Gastrointestinal: Negative.  Negative for abdominal pain, blood in stool and melena.  Genitourinary: Negative.  Negative for dysuria and hematuria.  Musculoskeletal: Negative.  Negative for back pain.  Skin: Negative.  Negative for rash.  Neurological: Negative.  Negative for sensory change, focal weakness, weakness and headaches.  Psychiatric/Behavioral: Negative.  The patient is not nervous/anxious.     As per HPI. Otherwise, a complete review of systems is negative.  PAST MEDICAL HISTORY: Past Medical History:  Diagnosis Date  . Arthritis   . Collagen  vascular disease (HCC)    rheumatoid arthritis  . COPD (chronic obstructive pulmonary disease) ([place])   . GERD (gastroesophageal reflux disease)   . History of prostate cancer   . HLD (hyperlipidemia)   . HOH (hard of hearing)   . Insomnia   . Lung cancer ([place]) 2017   rad tx's.  . Oxygen deficit    2L HS AND PRN  . Prostate cancer ([place]) 2006   Rad seed tx's.  . Stroke ([place])    2000    PAST SURGICAL HISTORY: Past Surgical History:  Procedure Laterality Date  . APPENDECTOMY  1965  . CATARACT EXTRACTION W/PHACO Right 01/05/2016   Procedure: CATARACT EXTRACTION PHACO AND INTRAOCULAR LENS PLACEMENT (IOC);  Surgeon: [name], MD;  Location: [facility] ORS;  Service: Ophthalmology;  Laterality: Right;  [place] 1.14 AP% 18.1 CDE 13.44 FLUID PACK LOT # [id number] H  . CYSTOSCOPY W/ RETROGRADES Bilateral 11/01/2017   Procedure: CYSTOSCOPY WITH RETROGRADE PYELOGRAM;  Surgeon: Hollice Espy, MD;  Location: [facility] ORS;  Service: Urology;  Laterality: Bilateral;  . ENDOBRONCHIAL ULTRASOUND N/A 02/26/2015   Procedure: ENDOBRONCHIAL ULTRASOUND;  Surgeon: Flora Lipps, MD;  Location: [facility] ORS;  Service: Cardiopulmonary;  Laterality: N/A;  . [place]  . INSERTION PROSTATE RADIATION SEED  2002  . TRANSURETHRAL RESECTION OF PROSTATE N/A 11/01/2017   Procedure: TRANSURETHRAL RESECTION OF THE PROSTATE (TURP) (channel TURP);  Surgeon: Hollice Espy, MD;  Location: [facility] ORS;  Service: Urology;  Laterality: N/A;    FAMILY HISTORY: Family History  Problem Relation Age of Onset  . Cancer Mother   . Cancer Father        Lung Cancer  . Cancer Sister        breast  .  Cancer Brother        stomach    ADVANCED DIRECTIVES (Y/N):  N  HEALTH MAINTENANCE: Social History   Tobacco Use  . Smoking status: Former Smoker    Packs/day: 0.50    Years: 50.00    Pack years: 25.00    Types: Cigarettes    Last attempt to quit: 02/21/2013    Years since quitting: 5.2  . Smokeless tobacco: Never Used  .  Tobacco comment: smoking cessation materials not required  Substance Use Topics  . Alcohol use: No    Alcohol/week: 0.0 standard drinks  . Drug use: No     Colonoscopy:  PAP:  Bone density:  Lipid panel:  Allergies  Allergen Reactions  . Plaquenil [Hydroxychloroquine]     Bad dreams  . Simvastatin Other (See Comments)    Bad dreams    Current Outpatient Medications  Medication Sig Dispense Refill  . finasteride (PROSCAR) 5 MG tablet Take 5 mg by mouth daily.  11  . Fluticasone-Umeclidin-Vilant (TRELEGY ELLIPTA) 100-62.5-25 MCG/INH AEPB Inhale 1 puff into the lungs daily. 60 each 5  . folic acid (FOLVITE) 1 MG tablet Take 1 tablet (1 mg total) by mouth daily. 30 tablet 5  . megestrol (MEGACE) 20 MG tablet Take 1 tablet (20 mg total) by mouth 2 (two) times daily. 180 tablet 1  . methotrexate (RHEUMATREX) 2.5 MG tablet Take 5 tablets by mouth once a week.    . OXYGEN Place 2 L into the nose daily as needed.     Marland Kitchen QUEtiapine (SEROQUEL) 50 MG tablet Take 1 tablet (50 mg total) by mouth at bedtime. 90 tablet 0  . tamsulosin (FLOMAX) 0.4 MG CAPS capsule Take 1 capsule (0.4 mg total) by mouth daily. (Patient taking differently: Take 0.8 mg by mouth daily. ) 30 capsule 11  . HYDROcodone-acetaminophen (NORCO) 5-325 MG tablet Take 1 tablet by mouth every 6 (six) hours as needed for moderate pain. 30 tablet 0  . prochlorperazine (COMPAZINE) 10 MG tablet Take 1 tablet (10 mg total) by mouth every 6 (six) hours as needed (Nausea or vomiting). (Patient not taking: Reported on 05/09/2018) 60 tablet 2   No current facility-administered medications for this visit.     OBJECTIVE: Vitals:   05/09/18 0931  BP: (!) 101/57  Pulse: 95  Resp: 18  Temp: (!) 96.9 F (36.1 C)  SpO2: 93%     Body mass index is 16.51 kg/m.    ECOG FS:0 - Asymptomatic  General: Well-developed, well-nourished, no acute distress. Eyes: Pink conjunctiva, anicteric sclera. HEENT: Normocephalic, moist mucous membranes.  Lungs: Clear to auscultation bilaterally. Heart: Regular rate and rhythm. No rubs, murmurs, or gallops. Abdomen: Soft, nontender, nondistended. No organomegaly noted, normoactive bowel sounds. Musculoskeletal: No edema, cyanosis, or clubbing. Neuro: Alert, answering all questions appropriately. Cranial nerves grossly intact. Skin: No rashes or petechiae noted. Psych: Normal affect.  LAB RESULTS:  Lab Results  Component Value Date   NA 137 05/09/2018   K 3.5 05/09/2018   CL 109 05/09/2018   CO2 22 05/09/2018   GLUCOSE 169 (H) 05/09/2018   BUN 22 05/09/2018   CREATININE 0.99 05/09/2018   CALCIUM 8.4 (L) 05/09/2018   PROT 8.3 (H) 05/09/2018   ALBUMIN 2.2 (L) 05/09/2018   AST 23 05/09/2018   ALT 16 05/09/2018   ALKPHOS 62 05/09/2018   BILITOT 0.4 05/09/2018   GFRNONAA >60 05/09/2018   GFRAA >60 05/09/2018    Lab Results  Component Value Date   WBC  7.4 05/09/2018   NEUTROABS 5.7 05/09/2018   HGB 8.5 (L) 05/09/2018   HCT 28.8 (L) 05/09/2018   MCV 92.6 05/09/2018   PLT 410 (H) 05/09/2018     STUDIES: No results found.  ASSESSMENT: Locally advanced high-grade urothelial carcinoma.  PLAN:    1. Locally advanced high-grade urothelial carcinoma: Case discussed with urology, radiation oncology, as well as multidisciplinary tumor board.  Patient is not a surgical candidate nor can he receive additional XRT given his history of prostate cancer and brachii therapy seed placement.  Though he has a decent performance status, patient is frail and likely would not tolerate cisplatin based therapy.  CT scan results from March 26, 2018 reviewed independently with concern for progression of disease.  Case was also discussed with Dr. Erlene Quan of urology.  After lengthy discussion with the patient, he wishes to continue palliative treatment with Tecentriq expressing understanding that it may not be offering much benefit.  Unfortunately, patient did not have IV access today and did not  receive treatment.  He has finally agreed to port placement.  Will reimage in 3 weeks and patient will follow-up several days later to discuss continuing Tecentriq for possibly transitioning to dose reduced single agent gemcitabine.  Previously, hospice and end-of-life were discussed but patient is not interested at this time.   2.  Right upper lobe lung mass: Previously, although biopsy was negative, patient had a positive PET scan that was highly suspicious for underlying malignancy. He underwent SBRT in approximately May 2017.   3.  Shortness of breath: Chronic and unchanged.  Patient now requires oxygen 24 hours/day.   4.  Anemia: Patient's hemoglobin continues to trend down and is now 8.5.  Monitor. 5.  Hematuria: Patient does not complain of this today.  Continue follow-up with urology as indicated. 6.  Hypokalemia: Resolved.  Patient expressed understanding and was in agreement with this plan. He also understands that He can call clinic at any time with any questions, concerns, or complaints.    Lloyd Huger, MD   05/09/2018 5:24 PM

## 2018-05-10 LAB — THYROID PANEL WITH TSH
Free Thyroxine Index: 2.1 (ref 1.2–4.9)
T3 Uptake Ratio: 29 % (ref 24–39)
T4, Total: 7.2 ug/dL (ref 4.5–12.0)
TSH: 1.44 u[IU]/mL (ref 0.450–4.500)

## 2018-05-13 ENCOUNTER — Other Ambulatory Visit (INDEPENDENT_AMBULATORY_CARE_PROVIDER_SITE_OTHER): Payer: Self-pay | Admitting: Nurse Practitioner

## 2018-05-14 ENCOUNTER — Other Ambulatory Visit: Payer: Self-pay

## 2018-05-14 ENCOUNTER — Ambulatory Visit
Admission: RE | Admit: 2018-05-14 | Discharge: 2018-05-14 | Disposition: A | Discharge status: Home / Self Care | Payer: Medicare Other | Attending: Vascular Surgery | Admitting: Vascular Surgery

## 2018-05-14 ENCOUNTER — Encounter
Admission: RE | Disposition: A | Discharge status: Home / Self Care | Payer: Self-pay | Source: Home / Self Care | Attending: Vascular Surgery

## 2018-05-14 DIAGNOSIS — C679 Malignant neoplasm of bladder, unspecified: Secondary | ICD-10-CM

## 2018-05-14 DIAGNOSIS — Z452 Encounter for adjustment and management of vascular access device: Secondary | ICD-10-CM | POA: Insufficient documentation

## 2018-05-14 HISTORY — PX: PORTA CATH INSERTION: CATH118285

## 2018-05-14 SURGERY — PORTA CATH INSERTION
Anesthesia: Moderate Sedation

## 2018-05-14 MED ORDER — MIDAZOLAM HCL 2 MG/2ML IJ SOLN
INTRAMUSCULAR | Status: DC | PRN
  Administered 2018-05-14: 2 mg via INTRAVENOUS
  Administered 2018-05-14: 1 mg via INTRAVENOUS

## 2018-05-14 MED ORDER — CEFAZOLIN SODIUM-DEXTROSE 2-4 GM/100ML-% IV SOLN
INTRAVENOUS | Status: AC
  Administered 2018-05-14: 2 g
  Filled 2018-05-14: qty 100

## 2018-05-14 MED ORDER — DIPHENHYDRAMINE HCL 50 MG/ML IJ SOLN: 50.0000 mg | Freq: Once | INTRAMUSCULAR | Status: DC | PRN

## 2018-05-14 MED ORDER — HEPARIN (PORCINE) IN NACL 1000-0.9 UT/500ML-% IV SOLN
INTRAVENOUS | Status: AC
  Filled 2018-05-14: qty 500

## 2018-05-14 MED ORDER — FAMOTIDINE 20 MG PO TABS: 40.0000 mg | ORAL_TABLET | Freq: Once | ORAL | Status: DC | PRN

## 2018-05-14 MED ORDER — FENTANYL CITRATE (PF) 100 MCG/2ML IJ SOLN
INTRAMUSCULAR | Status: DC | PRN
  Administered 2018-05-14: 25 ug via INTRAVENOUS
  Administered 2018-05-14: 50 ug via INTRAVENOUS

## 2018-05-14 MED ORDER — SODIUM CHLORIDE 0.9 % IV SOLN
INTRAVENOUS | Status: DC
  Administered 2018-05-14: 10:00:00 via INTRAVENOUS

## 2018-05-14 MED ORDER — LIDOCAINE-EPINEPHRINE (PF) 1 %-1:200000 IJ SOLN
INTRAMUSCULAR | Status: AC
  Filled 2018-05-14: qty 30

## 2018-05-14 MED ORDER — MIDAZOLAM HCL 5 MG/5ML IJ SOLN
INTRAMUSCULAR | Status: AC
  Filled 2018-05-14: qty 5

## 2018-05-14 MED ORDER — FENTANYL CITRATE (PF) 100 MCG/2ML IJ SOLN
INTRAMUSCULAR | Status: AC
  Filled 2018-05-14: qty 2

## 2018-05-14 MED ORDER — CEFAZOLIN SODIUM-DEXTROSE 2-4 GM/100ML-% IV SOLN: 2.0000 g | Freq: Once | INTRAVENOUS | Status: DC

## 2018-05-14 MED ORDER — HYDROMORPHONE HCL 1 MG/ML IJ SOLN: 1.0000 mg | Freq: Once | INTRAMUSCULAR | Status: DC | PRN

## 2018-05-14 MED ORDER — METHYLPREDNISOLONE SODIUM SUCC 125 MG IJ SOLR: 125.0000 mg | Freq: Once | INTRAMUSCULAR | Status: DC | PRN

## 2018-05-14 MED ORDER — MIDAZOLAM HCL 2 MG/ML PO SYRP: 8.0000 mg | ORAL_SOLUTION | Freq: Once | ORAL | Status: DC | PRN

## 2018-05-14 MED ORDER — SODIUM CHLORIDE 0.9 % IV SOLN
Freq: Once | INTRAVENOUS | Status: DC
  Filled 2018-05-14: qty 2

## 2018-05-14 MED ORDER — ONDANSETRON HCL 4 MG/2ML IJ SOLN: 4.0000 mg | Freq: Four times a day (QID) | INTRAMUSCULAR | Status: DC | PRN

## 2018-05-14 SURGICAL SUPPLY — 10 items
COVER PROBE U/S 5X48 (MISCELLANEOUS) ×3 IMPLANT
DERMABOND ADVANCED (GAUZE/BANDAGES/DRESSINGS) ×2
DERMABOND ADVANCED .7 DNX12 (GAUZE/BANDAGES/DRESSINGS) ×1 IMPLANT
KIT PORT POWER 8FR ISP CVUE (Port) ×3 IMPLANT
PACK ANGIOGRAPHY (CUSTOM PROCEDURE TRAY) ×3 IMPLANT
PAD GROUND ADULT SPLIT (MISCELLANEOUS) ×3 IMPLANT
PENCIL ELECTRO HAND CTR (MISCELLANEOUS) ×3 IMPLANT
SUT MNCRL AB 4-0 PS2 18 (SUTURE) ×3 IMPLANT
SUT PROLENE 0 CT 1 30 (SUTURE) ×3 IMPLANT
SUT VICRYL+ 3-0 36IN CT-1 (SUTURE) ×3 IMPLANT

## 2018-05-14 NOTE — H&P (Signed)
[facility] History & Physical Update  The patient was interviewed and re-examined.  The patient's previous History and Physical has been reviewed and is unchanged.  There is no change in the plan of care. We plan to proceed with the scheduled procedure.  Leotis Pain, MD  05/14/2018, 10:50 AM

## 2018-05-14 NOTE — Op Note (Signed)
      [facility]       Operative Note  Date: 05/14/2018  Preoperative diagnosis:  1. Bladder cancer  Postoperative diagnosis:  Same as above  Procedures: #1. Ultrasound guidance for vascular access to the right internal jugular vein. #2. Fluoroscopic guidance for placement of catheter. #3. Placement of CT compatible Port-A-Cath, right internal jugular vein.  Surgeon: Leotis Pain, MD.   Anesthesia: Local with moderate conscious sedation for approximately 20 minutes using 3 mg of Versed and 75 mcg of Fentanyl  Fluoroscopy time: less than 1 minute  Contrast used: 0  Estimated blood loss: 3 cc  Indication for the procedure:  The patient is a 83 y.o.male with bladder cancer.  The patient needs a Port-A-Cath for durable venous access, chemotherapy, lab draws, and CT scans. We are asked to place this. Risks and benefits were discussed and informed consent was obtained.  Description of procedure: The patient was brought to the vascular and interventional radiology suite.  Moderate conscious sedation was administered throughout the procedure during a face to face encounter with the patient with my supervision of the RN administering medicines and monitoring the patient's vital signs, pulse oximetry, telemetry and mental status throughout from the start of the procedure until the patient was taken to the recovery room. The right neck chest and shoulder were sterilely prepped and draped, and a sterile surgical field was created. Ultrasound was used to help visualize a patent right internal jugular vein. This was then accessed under direct ultrasound guidance without difficulty with the Seldinger needle and a permanent image was recorded. A J-wire was placed. After skin nick and dilatation, the peel-away sheath was then placed over the wire. I then anesthetized an area under the clavicle approximately 1-2 fingerbreadths. A transverse incision was created and an inferior pocket was  created with electrocautery and blunt dissection. The port was then brought onto the field, placed into the pocket and secured to the chest wall with 2 Prolene sutures. The catheter was connected to the port and tunneled from the subclavicular incision to the access site. Fluoroscopic guidance was then used to cut the catheter to an appropriate length. The catheter was then placed through the peel-away sheath and the peel-away sheath was removed. The catheter tip was parked in excellent location under fluorocoscopic guidance in the cavoatrial junction. The pocket was then irrigated with antibiotic impregnated saline and the wound was closed with a running 3-0 Vicryl and a 4-0 Monocryl. The access incision was closed with a single 4-0 Monocryl. The Huber needle was used to withdraw blood and flush the port with heparinized saline. Dermabond was then placed as a dressing. The patient tolerated the procedure well and was taken to the recovery room in stable condition.   Leotis Pain 05/14/2018 12:49 PM   This note was created with Dragon Medical transcription system. Any errors in dictation are purely unintentional.

## 2018-05-15 ENCOUNTER — Encounter: Payer: Self-pay | Admitting: Vascular Surgery

## 2018-05-24 NOTE — Progress Notes (Signed)
04/25/2018 3:09 PM   Collin Gonzales 07-24-1935 [id number]  Referring provider: [name], MD [address] [place], [place] [address]  Chief Complaint  Patient presents with  . 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  He was previously managed with a chronic indwelling Foley.  This is subsequently been removed he is now voiding independently.  PVR today remains minimal.  He still passing a small amount of debris in his urine but this is slowed down.   PMH: Past Medical History:  Diagnosis Date  . Arthritis   . Collagen vascular disease (HCC)    rheumatoid arthritis  . COPD (chronic obstructive pulmonary disease) ([place])   . GERD (gastroesophageal reflux disease)   . History of prostate cancer   . HLD (hyperlipidemia)   . HOH (hard of hearing)   . Insomnia   . Lung cancer ([place]) 2017   rad tx's.  . Oxygen deficit    2L HS AND PRN  . Prostate cancer ([place]) 2006   Rad seed tx's.  . Stroke [facility])    2000    Surgical History: Past Surgical History:  Procedure Laterality Date  . APPENDECTOMY  1965  . CATARACT EXTRACTION W/PHACO Right 01/05/2016   Procedure: CATARACT EXTRACTION PHACO AND INTRAOCULAR LENS PLACEMENT (IOC);  Surgeon: [name], MD;  Location: [facility] ORS;  Service: Ophthalmology;  Laterality: Right;  [place] 1.14 AP% 18.1 CDE 13.44 FLUID PACK LOT # [id number] H  . CYSTOSCOPY W/ RETROGRADES Bilateral 11/01/2017   Procedure: CYSTOSCOPY WITH RETROGRADE PYELOGRAM;  Surgeon: Hollice Espy, MD;  Location: [facility];  Service: Urology;  Laterality: Bilateral;  . ENDOBRONCHIAL ULTRASOUND N/A 02/26/2015   Procedure: ENDOBRONCHIAL ULTRASOUND;  Surgeon: Flora Lipps, MD;  Location: [facility];  Service: Cardiopulmonary;  Laterality: N/A;  . [place]  . INSERTION PROSTATE RADIATION SEED  2002  . PORTA CATH INSERTION N/A  05/14/2018   Procedure: PORTA CATH INSERTION;  Surgeon: Algernon Huxley, MD;  Location: [facility];  Service: Cardiovascular;  Laterality: N/A;  . TRANSURETHRAL RESECTION OF PROSTATE N/A 11/01/2017   Procedure: TRANSURETHRAL RESECTION OF THE PROSTATE (TURP) (channel TURP);  Surgeon: Hollice Espy, MD;  Location: [facility];  Service: Urology;  Laterality: N/A;    Home Medications:  Allergies as of 04/25/2018      Reactions   Plaquenil [hydroxychloroquine]    Bad dreams   Simvastatin Other (See Comments)   Bad dreams      Medication List       Accurate as of April 25, 2018 11:59 PM. Always use your most recent med list.        finasteride 5 MG tablet Commonly known as:  PROSCAR Take 5 mg by mouth daily.   Fluticasone-Umeclidin-Vilant 100-62.5-25 MCG/INH Aepb Commonly known as:  Trelegy Ellipta Inhale 1 puff into the lungs daily.   folic acid 1 MG tablet Commonly known as:  FOLVITE Take 1 tablet (1 mg total) by mouth daily.   megestrol 20 MG tablet Commonly known as:  MEGACE Take 1 tablet (20 mg total) by mouth 2 (two) times daily.   methotrexate 2.5 MG tablet Commonly known as:  RHEUMATREX Take 5 tablets by mouth once a week.   OXYGEN Place 2 L into the nose daily as needed.   prochlorperazine 10 MG tablet Commonly known as:  COMPAZINE Take 1 tablet (10 mg  total) by mouth every 6 (six) hours as needed (Nausea or vomiting).   QUEtiapine 50 MG tablet Commonly known as:  SEROQUEL Take 1 tablet (50 mg total) by mouth at bedtime.   tamsulosin 0.4 MG Caps capsule Commonly known as:  FLOMAX Take 1 capsule (0.4 mg total) by mouth daily.       Allergies:  Allergies  Allergen Reactions  . Plaquenil [Hydroxychloroquine]     Bad dreams  . Simvastatin Other (See Comments)    Bad dreams    Family History: Family History  Problem Relation Age of Onset  . Cancer Mother   . Cancer Father        Lung Cancer  . Cancer Sister        breast  . Cancer Brother         stomach    Social History:  reports that he quit smoking about 5 years ago. His smoking use included cigarettes. He has a 25.00 pack-year smoking history. He has never used smokeless tobacco. He reports that he does not drink alcohol or use drugs.  ROS: UROLOGY Frequent Urination?: No Hard to postpone urination?: Yes Burning/pain with urination?: No Get up at night to urinate?: Yes Leakage of urine?: Yes Urine stream starts and stops?: Yes Trouble starting stream?: No Do you have to strain to urinate?: No Blood in urine?: No Urinary tract infection?: No Sexually transmitted disease?: No Injury to kidneys or bladder?: No Painful intercourse?: No Weak stream?: Yes Erection problems?: Yes Penile pain?: Yes  Gastrointestinal Nausea?: No Vomiting?: No Indigestion/heartburn?: No Diarrhea?: No Constipation?: No  Constitutional Fever: No Night sweats?: No Weight loss?: Yes Fatigue?: Yes  Skin Skin rash/lesions?: No Itching?: No  Eyes Blurred vision?: No Double vision?: No  Ears/Nose/Throat Sore throat?: No Sinus problems?: No  Hematologic/Lymphatic Swollen glands?: No Easy bruising?: No  Cardiovascular Leg swelling?: No Chest pain?: No  Respiratory Cough?: Yes Shortness of breath?: Yes  Endocrine Excessive thirst?: No  Musculoskeletal Back pain?: No Joint pain?: No  Neurological Headaches?: No Dizziness?: No  Psychologic Depression?: No Anxiety?: No  Physical Exam: BP (!) 89/42   Pulse 91   Ht 5\' 8"  (1.727 m)   Wt 109 lb (49.4 kg)   BMI 16.57 kg/m   Constitutional:  Alert and oriented, No acute distress.  Cachectic appearing.  Nontoxic.  Wearing O2. HEENT: [place] AT, moist mucus membranes.  Trachea midline, no masses. Cardiovascular: No clubbing, cyanosis, or edema. Abdomen: Soft, nontender. Respiratory: Normal respiratory effort, no increased work of breathing. Skin: No rashes, bruises or suspicious lesions. Neurologic: Grossly  intact, no focal deficits, moving all 4 extremities. Psychiatric: Normal mood and affect.  Pertinent Imaging: Results for orders placed or performed in visit on 04/25/18  BLADDER SCAN AMB NON-IMAGING  Result Value Ref Range   Scan Result 162ml      Assessment & Plan:    1. Malignant neoplasm of urinary bladder neck (HCC) Locally advanced Palliative treatment managed by Dr. [name]  2. Urinary retention Foley now removed, appears to be voiding independently without issues Bladder scan today is reassuring - BLADDER SCAN AMB NON-IMAGING  3. Rectoprostatic fistula Based on symptoms and CT scan findings, probable recto prostatic fistula Not a surgical candidate, previously discussed with general surgery We will manage conservatively   Return in about 3 months (around 07/26/2018) for shannon PVR, symptoms recheck.  Hollice Espy, MD  [facility] [address] [place] [employer], [place] [address] [phone]

## 2018-05-25 ENCOUNTER — Other Ambulatory Visit: Payer: Self-pay

## 2018-05-25 ENCOUNTER — Ambulatory Visit
Admission: RE | Admit: 2018-05-25 | Discharge: 2018-05-25 | Disposition: A | Discharge status: Home / Self Care | Payer: Medicare Other | Source: Ambulatory Visit | Attending: Oncology | Admitting: Oncology

## 2018-05-25 ENCOUNTER — Ambulatory Visit: Payer: Medicare Other

## 2018-05-25 DIAGNOSIS — C679 Malignant neoplasm of bladder, unspecified: Secondary | ICD-10-CM | POA: Insufficient documentation

## 2018-05-25 DIAGNOSIS — R911 Solitary pulmonary nodule: Secondary | ICD-10-CM | POA: Diagnosis not present

## 2018-05-25 DIAGNOSIS — R319 Hematuria, unspecified: Secondary | ICD-10-CM | POA: Diagnosis not present

## 2018-05-25 HISTORY — DX: Malignant neoplasm of unspecified part of right bronchus or lung: C34.91

## 2018-05-25 HISTORY — DX: Malignant neoplasm of bladder, unspecified: C67.9

## 2018-05-25 MED ORDER — IOHEXOL 300 MG/ML  SOLN
75.0000 mL | Freq: Once | INTRAMUSCULAR | Status: AC | PRN
  Administered 2018-05-25: 75 mL via INTRAVENOUS

## 2018-05-28 ENCOUNTER — Telehealth: Payer: Self-pay | Admitting: Family Medicine

## 2018-05-28 ENCOUNTER — Ambulatory Visit: Payer: Medicare HMO | Admitting: Family Medicine

## 2018-05-28 NOTE — Telephone Encounter (Signed)
Called pt to inform him that Dr Ancil Boozer did not want him coming here until the [facility] virus was gone. Pt will call back after he speaks to his daughters

## 2018-05-28 NOTE — Telephone Encounter (Signed)
Patient daughter states he would rather come in since granddaughter was unable to get his vitals. Please reschedule.

## 2018-05-28 NOTE — Telephone Encounter (Signed)
Tiffany asked me to resch this . Called both daughters and was not able to get either one of them . Left message to call and resch for Sowles tried to do a phone visit but was unable to do so.

## 2018-05-29 ENCOUNTER — Other Ambulatory Visit: Payer: Self-pay

## 2018-05-30 ENCOUNTER — Inpatient Hospital Stay: Payer: Medicare Other

## 2018-05-30 ENCOUNTER — Inpatient Hospital Stay (HOSPITAL_BASED_OUTPATIENT_CLINIC_OR_DEPARTMENT_OTHER): Payer: Medicare Other | Admitting: Oncology

## 2018-05-30 ENCOUNTER — Other Ambulatory Visit: Payer: Self-pay

## 2018-05-30 ENCOUNTER — Inpatient Hospital Stay: Payer: Medicare Other | Attending: Oncology | Admitting: *Deleted

## 2018-05-30 ENCOUNTER — Inpatient Hospital Stay (HOSPITAL_BASED_OUTPATIENT_CLINIC_OR_DEPARTMENT_OTHER): Payer: Medicare Other | Admitting: Hospice and Palliative Medicine

## 2018-05-30 ENCOUNTER — Encounter: Payer: Self-pay | Admitting: Oncology

## 2018-05-30 VITALS — BP 96/58 | HR 93 | Temp 98.1°F | Ht 66.0 in | Wt 107.0 lb

## 2018-05-30 DIAGNOSIS — Z87891 Personal history of nicotine dependence: Secondary | ICD-10-CM | POA: Insufficient documentation

## 2018-05-30 DIAGNOSIS — G4700 Insomnia, unspecified: Secondary | ICD-10-CM

## 2018-05-30 DIAGNOSIS — D649 Anemia, unspecified: Secondary | ICD-10-CM | POA: Insufficient documentation

## 2018-05-30 DIAGNOSIS — N289 Disorder of kidney and ureter, unspecified: Secondary | ICD-10-CM

## 2018-05-30 DIAGNOSIS — C61 Malignant neoplasm of prostate: Secondary | ICD-10-CM

## 2018-05-30 DIAGNOSIS — Z95828 Presence of other vascular implants and grafts: Secondary | ICD-10-CM

## 2018-05-30 DIAGNOSIS — C679 Malignant neoplasm of bladder, unspecified: Secondary | ICD-10-CM

## 2018-05-30 DIAGNOSIS — K219 Gastro-esophageal reflux disease without esophagitis: Secondary | ICD-10-CM

## 2018-05-30 DIAGNOSIS — Z8673 Personal history of transient ischemic attack (TIA), and cerebral infarction without residual deficits: Secondary | ICD-10-CM | POA: Insufficient documentation

## 2018-05-30 DIAGNOSIS — G893 Neoplasm related pain (acute) (chronic): Secondary | ICD-10-CM | POA: Diagnosis not present

## 2018-05-30 DIAGNOSIS — M069 Rheumatoid arthritis, unspecified: Secondary | ICD-10-CM | POA: Diagnosis not present

## 2018-05-30 DIAGNOSIS — E785 Hyperlipidemia, unspecified: Secondary | ICD-10-CM | POA: Diagnosis not present

## 2018-05-30 DIAGNOSIS — Z7951 Long term (current) use of inhaled steroids: Secondary | ICD-10-CM

## 2018-05-30 DIAGNOSIS — R918 Other nonspecific abnormal finding of lung field: Secondary | ICD-10-CM

## 2018-05-30 DIAGNOSIS — Z79899 Other long term (current) drug therapy: Secondary | ICD-10-CM | POA: Diagnosis not present

## 2018-05-30 DIAGNOSIS — Z5112 Encounter for antineoplastic immunotherapy: Secondary | ICD-10-CM | POA: Insufficient documentation

## 2018-05-30 DIAGNOSIS — J449 Chronic obstructive pulmonary disease, unspecified: Secondary | ICD-10-CM

## 2018-05-30 DIAGNOSIS — Z515 Encounter for palliative care: Secondary | ICD-10-CM

## 2018-05-30 LAB — CBC WITH DIFFERENTIAL/PLATELET
Abs Immature Granulocytes: 0.06 10*3/uL (ref 0.00–0.07)
Basophils Absolute: 0 10*3/uL (ref 0.0–0.1)
Basophils Relative: 1 %
Eosinophils Absolute: 0.2 10*3/uL (ref 0.0–0.5)
Eosinophils Relative: 3 %
HCT: 31 % — ABNORMAL LOW (ref 39.0–52.0)
Hemoglobin: 9.2 g/dL — ABNORMAL LOW (ref 13.0–17.0)
Immature Granulocytes: 1 %
Lymphocytes Relative: 26 %
Lymphs Abs: 1.3 10*3/uL (ref 0.7–4.0)
MCH: 27.6 pg (ref 26.0–34.0)
MCHC: 29.7 g/dL — ABNORMAL LOW (ref 30.0–36.0)
MCV: 93.1 fL (ref 80.0–100.0)
Monocytes Absolute: 0.8 10*3/uL (ref 0.1–1.0)
Monocytes Relative: 15 %
Neutro Abs: 2.7 10*3/uL (ref 1.7–7.7)
Neutrophils Relative %: 54 %
Platelets: 373 10*3/uL (ref 150–400)
RBC: 3.33 MIL/uL — ABNORMAL LOW (ref 4.22–5.81)
RDW: 18.3 % — ABNORMAL HIGH (ref 11.5–15.5)
WBC: 5 10*3/uL (ref 4.0–10.5)
nRBC: 0 % (ref 0.0–0.2)

## 2018-05-30 LAB — COMPREHENSIVE METABOLIC PANEL
ALT: 9 U/L (ref 0–44)
AST: 16 U/L (ref 15–41)
Albumin: 2.2 g/dL — ABNORMAL LOW (ref 3.5–5.0)
Alkaline Phosphatase: 68 U/L (ref 38–126)
Anion gap: 3 — ABNORMAL LOW (ref 5–15)
BUN: 22 mg/dL (ref 8–23)
CO2: 27 mmol/L (ref 22–32)
Calcium: 8.2 mg/dL — ABNORMAL LOW (ref 8.9–10.3)
Chloride: 107 mmol/L (ref 98–111)
Creatinine, Ser: 1.38 mg/dL — ABNORMAL HIGH (ref 0.61–1.24)
GFR calc Af Amer: 56 mL/min — ABNORMAL LOW (ref >60)
GFR calc non Af Amer: 48 mL/min — ABNORMAL LOW (ref >60)
Glucose, Bld: 92 mg/dL (ref 70–99)
Potassium: 3.8 mmol/L (ref 3.5–5.1)
Sodium: 137 mmol/L (ref 135–145)
Total Bilirubin: 0.2 mg/dL — ABNORMAL LOW (ref 0.3–1.2)
Total Protein: 7.7 g/dL (ref 6.5–8.1)

## 2018-05-30 MED ORDER — SODIUM CHLORIDE 0.9 % IV SOLN
1200.0000 mg | Freq: Once | INTRAVENOUS | Status: AC
  Administered 2018-05-30: 1200 mg via INTRAVENOUS
  Filled 2018-05-30: qty 20

## 2018-05-30 MED ORDER — LEVOFLOXACIN 500 MG PO TABS: 500.0000 mg | ORAL_TABLET | Freq: Every day | ORAL | 0 refills | Status: DC

## 2018-05-30 MED ORDER — HEPARIN SOD (PORK) LOCK FLUSH 100 UNIT/ML IV SOLN
500.0000 [IU] | Freq: Once | INTRAVENOUS | Status: AC
  Administered 2018-05-30: 12:00:00 500 [IU] via INTRAVENOUS

## 2018-05-30 MED ORDER — SODIUM CHLORIDE 0.9 % IV SOLN
Freq: Once | INTRAVENOUS | Status: AC
  Administered 2018-05-30: 11:00:00 via INTRAVENOUS
  Filled 2018-05-30: qty 250

## 2018-05-30 MED ORDER — SODIUM CHLORIDE 0.9% FLUSH
10.0000 mL | Freq: Once | INTRAVENOUS | Status: AC
  Administered 2018-05-30: 10 mL via INTRAVENOUS
  Filled 2018-05-30: qty 10

## 2018-05-30 NOTE — Progress Notes (Signed)
Patient stated that he had been doing well with no complaints. Patient stated that his appetite is good.

## 2018-05-30 NOTE — Progress Notes (Signed)
[place]  [phone] [phone]   Name: Collin Gonzales Date: 05/30/2018 MRN: [id number]  DOB: 1935/07/21  Patient Care Team: [name], MD as PCP - General (Family Medicine) [name], MD as Referring Physician (Ophthalmology) [name], MD as Consulting Physician (Rheumatology) [name], MD as Referring Physician (Specialist) [name], MD as Consulting Physician (Urology) [name], MD as Referring Physician (Radiation Oncology)    REASON FOR CONSULTATION: Palliative Care consult requested for this 83 y.o. male with multiple medical problems including locally advanced high-grade urothelial carcinoma.  PMH also notable for O2 dependent COPD, PET positive lung mass status post SBRT, prostate cancer status post brachii therapy seed implant, history of CVA.  Patient is felt not to be a surgical candidate for his urothelial carcinoma and treatment options are felt to be somewhat limited based on frailty.  He was referred to palliative care to help address goals.   SOCIAL HISTORY:     reports that he quit smoking about 5 years ago. His smoking use included cigarettes. He has a 25.00 pack-year smoking history. He has never used smokeless tobacco. He reports that he does not drink alcohol or use drugs.   Patient is divorced.  He lives at home alone.  He has multiple children who live throughout the state.  Patient used to work in [employer].  ADVANCE DIRECTIVES:  Does not have  CODE STATUS:   PAST MEDICAL HISTORY: Past Medical History:  Diagnosis Date   Arthritis    Collagen vascular disease (HCC)    rheumatoid arthritis   COPD (chronic obstructive pulmonary disease) (HCC)    GERD (gastroesophageal reflux disease)    History of prostate cancer    HLD (hyperlipidemia)    HOH (hard of hearing)    Insomnia    Oxygen deficit    2L HS AND PRN    Primary cancer of right lung ([place]) 2017   rad tx's.   Prostate cancer ([place]) 2006   Rad seed tx's.   Stroke [facility])    2000   Urothelial carcinoma of bladder ([place]) 10/2017   TURP to Prostate and chemo tx's.    PAST SURGICAL HISTORY:  Past Surgical History:  Procedure Laterality Date   APPENDECTOMY  1965   CATARACT EXTRACTION W/PHACO Right 01/05/2016   Procedure: CATARACT EXTRACTION PHACO AND INTRAOCULAR LENS PLACEMENT ([place]);  Surgeon: [name], MD;  Location: [facility] ORS;  Service: Ophthalmology;  Laterality: Right;  [place] 1.14 AP% 18.1 CDE 13.44 FLUID PACK LOT # [id number] H   CYSTOSCOPY W/ RETROGRADES Bilateral 11/01/2017   Procedure: CYSTOSCOPY WITH RETROGRADE PYELOGRAM;  Surgeon: Hollice Espy, MD;  Location: [facility] ORS;  Service: Urology;  Laterality: Bilateral;   ENDOBRONCHIAL ULTRASOUND N/A 02/26/2015   Procedure: ENDOBRONCHIAL ULTRASOUND;  Surgeon: Flora Lipps, MD;  Location: [facility] ORS;  Service: Cardiopulmonary;  Laterality: N/A;   [place] RADIATION SEED  2002   PORTA CATH INSERTION N/A 05/14/2018   Procedure: PORTA CATH INSERTION;  Surgeon: Algernon Huxley, MD;  Location: [place] CV LAB;  Service: Cardiovascular;  Laterality: N/A;   TRANSURETHRAL RESECTION OF PROSTATE N/A 11/01/2017   Procedure: TRANSURETHRAL RESECTION OF THE PROSTATE (TURP) (channel TURP);  Surgeon: Hollice Espy, MD;  Location: [facility] ORS;  Service: Urology;  Laterality: N/A;    HEMATOLOGY/ONCOLOGY HISTORY:    Urothelial carcinoma of bladder ([place])   11/12/2017 Initial Diagnosis    Urothelial carcinoma of bladder (  [place])    11/23/2017 -  Chemotherapy    The patient had atezolizumab (TECENTRIQ) 1,200 mg in sodium chloride 0.9 % 250 mL chemo infusion, 1,200 mg, Intravenous, Once, 8 of 12 cycles Administration: 1,200 mg (11/23/2017), 1,200 mg (12/14/2017), 1,200 mg (01/04/2018), 1,200 mg (01/25/2018), 1,200 mg (03/22/2018), 1,200 mg (02/28/2018), 1,200 mg (04/12/2018)  for  chemotherapy treatment.      ALLERGIES:  is allergic to plaquenil [hydroxychloroquine] and simvastatin.  MEDICATIONS:  Current Outpatient Medications  Medication Sig Dispense Refill   finasteride (PROSCAR) 5 MG tablet Take 5 mg by mouth daily.  11   Fluticasone-Umeclidin-Vilant (TRELEGY ELLIPTA) 100-62.5-25 MCG/INH AEPB Inhale 1 puff into the lungs daily. 60 each 5   folic acid (FOLVITE) 1 MG tablet Take 1 tablet (1 mg total) by mouth daily. 30 tablet 5   HYDROcodone-acetaminophen (NORCO) 5-325 MG tablet Take 1 tablet by mouth every 6 (six) hours as needed for moderate pain. 30 tablet 0   levofloxacin (LEVAQUIN) 500 MG tablet Take 1 tablet (500 mg total) by mouth daily. 7 tablet 0   megestrol (MEGACE) 20 MG tablet Take 1 tablet (20 mg total) by mouth 2 (two) times daily. 180 tablet 1   methotrexate (RHEUMATREX) 2.5 MG tablet Take 5 tablets by mouth once a week.     OXYGEN Place 2 L into the nose daily as needed.      prochlorperazine (COMPAZINE) 10 MG tablet Take 1 tablet (10 mg total) by mouth every 6 (six) hours as needed (Nausea or vomiting). 60 tablet 2   QUEtiapine (SEROQUEL) 50 MG tablet Take 1 tablet (50 mg total) by mouth at bedtime. 90 tablet 0   tamsulosin (FLOMAX) 0.4 MG CAPS capsule Take 1 capsule (0.4 mg total) by mouth daily. (Patient taking differently: Take 0.8 mg by mouth daily. ) 30 capsule 11   No current facility-administered medications for this visit.     VITAL SIGNS: There were no vitals taken for this visit. There were no vitals filed for this visit.  Estimated body mass index is 17.27 kg/m as calculated from the following:   Height as of an earlier encounter on 05/30/18: 5\' 6"  (1.676 m).   Weight as of an earlier encounter on 05/30/18: 107 lb (48.5 kg).  LABS: CBC:    Component Value Date/Time   WBC 5.0 05/30/2018 0927   HGB 9.2 (L) 05/30/2018 0927   HGB 12.3 (L) 11/21/2014 1010   HCT 31.0 (L) 05/30/2018 0927   HCT 39.3 11/21/2014 1010   PLT  373 05/30/2018 0927   PLT 387 (H) 11/21/2014 1010   MCV 93.1 05/30/2018 0927   MCV 95 11/21/2014 1010   MCV 95 01/05/2014 0429   NEUTROABS 2.7 05/30/2018 0927   NEUTROABS 3.2 11/21/2014 1010   NEUTROABS 5.3 01/05/2014 0429   LYMPHSABS 1.3 05/30/2018 0927   LYMPHSABS 1.7 11/21/2014 1010   LYMPHSABS 0.4 (L) 01/05/2014 0429   MONOABS 0.8 05/30/2018 0927   MONOABS 0.2 01/05/2014 0429   EOSABS 0.2 05/30/2018 0927   EOSABS 0.1 11/21/2014 1010   EOSABS 0.0 01/05/2014 0429   BASOSABS 0.0 05/30/2018 0927   BASOSABS 0.0 11/21/2014 1010   BASOSABS 0.0 01/05/2014 0429   Comprehensive Metabolic Panel:    Component Value Date/Time   NA 137 05/30/2018 0927   NA 139 12/08/2014 1004   NA 138 01/04/2014 0430   K 3.8 05/30/2018 0927   K 4.4 01/04/2014 0430   CL 107 05/30/2018 0927   CL 102 01/04/2014 0430  CO2 27 05/30/2018 0927   CO2 30 01/04/2014 0430   BUN 22 05/30/2018 0927   BUN 15 12/08/2014 1004   BUN 17 01/04/2014 0430   CREATININE 1.38 (H) 05/30/2018 0927   CREATININE 1.17 10/11/2017 1239   GLUCOSE 92 05/30/2018 0927   GLUCOSE 121 (H) 01/04/2014 0430   CALCIUM 8.2 (L) 05/30/2018 0927   CALCIUM 8.4 (L) 01/04/2014 0430   AST 16 05/30/2018 0927   ALT 9 05/30/2018 0927   ALKPHOS 68 05/30/2018 0927   BILITOT 0.2 (L) 05/30/2018 0927   BILITOT 0.3 12/08/2014 1004   PROT 7.7 05/30/2018 0927   PROT 8.6 (H) 12/08/2014 1004   ALBUMIN 2.2 (L) 05/30/2018 0927   ALBUMIN 3.7 12/08/2014 1004    RADIOGRAPHIC STUDIES: Ct Chest W Contrast  Result Date: 05/25/2018 CLINICAL DATA:  Prostate cancer with radiation seeds in 2007. Bladder cancer diagnosed in September. SPRT for right upper lobe lung mass in 2017. EXAM: CT CHEST, ABDOMEN, AND PELVIS WITH CONTRAST TECHNIQUE: Multidetector CT imaging of the chest, abdomen and pelvis was performed following the standard protocol during bolus administration of intravenous contrast. CONTRAST:  22mL OMNIPAQUE IOHEXOL 300 MG/ML  SOLN COMPARISON:  Pelvic  CT of 03/26/2018. Most recent abdominopelvic CT of 11/14/2017. PET of 12/12/2017. Chest CT of 07/27/2017. FINDINGS: CT CHEST FINDINGS Cardiovascular: Right Port-A-Cath tip at low SVC. Aortic atherosclerosis. Normal heart size, without pericardial effusion. Multivessel coronary artery atherosclerosis. No central pulmonary embolism, on this non-dedicated study. Mediastinum/Nodes: No mediastinal or hilar adenopathy. Lungs/Pleura: No pleural fluid. Probable secretions in the dependent mainstem bronchi bilaterally. Advanced bullous type emphysema. Posterior right upper lobe volume loss and consolidation measure on the order of 3.6 by 1.8 cm today versus 3.9 x 1.9 cm on the prior exam (when remeasured). New right middle lobe and lingular consolidation with air bronchograms and volume loss. Lateral left upper lobe mild airspace disease and air bronchograms, new on image 102/3. A left lower lobe pulmonary nodule measures 5 mm on image 120/3, new since 07/27/2017. A 4 mm superior segment left lower lobe pulmonary nodule on image 84/3 and a posterior left upper lobe 5 mm nodule on image 73/3 are both similar. Musculoskeletal: No acute osseous abnormality. Lower cervical spondylosis. CT ABDOMEN PELVIS FINDINGS Hepatobiliary: Normal liver. Normal gallbladder, without biliary ductal dilatation. Pancreas: Normal, without mass or ductal dilatation. Spleen: Normal in size, without focal abnormality. Adrenals/Urinary Tract: Normal right adrenal gland. Mild left adrenal thickening. An upper pole left renal lesion is too small to characterize. No hydronephrosis. Since the pelvic CT of 03/26/2018, the Foley catheter has been removed. There is persistent area either an or inferior to the caudal most aspect of the bladder, including on image 110/2. Stomach/Bowel: Normal stomach, without wall thickening. Anal/rectal wall thickening adjacent to the bladder is improved to resolved, including on image 112/2. Scattered colonic diverticula.  Colonic stool burden suggests constipation. Normal terminal ileum. Normal small bowel. Vascular/Lymphatic: Aortic and branch vessel atherosclerosis. No abdominopelvic adenopathy. Reproductive: Radiation seeds within the prostate. Right larger than left hydroceles, incompletely and suboptimally imaged. Other: No significant free fluid.  No free intraperitoneal air. Musculoskeletal: No acute osseous abnormality. Lumbosacral spondylosis. IMPRESSION: 1. Since the pelvic CT of 03/26/2018, removal of Foley catheter. Persistent air either within or inferior to the urinary bladder (in the region of the vesicourethral junction). Unless there has been recent instrumentation, this is suspicious for a fistulous communication from the bladder base/urethra to the rectum or anus. The extent of adjacent anorectal wall thickening is decreased.  2. Although limited by paucity of abdominopelvic fat, no evidence of abdominopelvic adenopathy to suggest metastatic disease. 3. New left upper lobe, right middle lobe, and lingular pulmonary opacities, suspicious for infection. 4. Left-sided pulmonary nodules are primarily similar. A left lower lobe 5 mm nodule is new and warrants follow-up attention. 5. Aortic atherosclerosis (ICD10-I70.0), coronary artery atherosclerosis and emphysema (ICD10-J43.9). 6.  Possible constipation. 7. Similar consolidation and architectural distortion in the posterior right apex. Electronically Signed   By: Abigail Miyamoto M.D.   On: 05/25/2018 14:41   Ct Abdomen Pelvis W Contrast  Result Date: 05/25/2018 CLINICAL DATA:  Prostate cancer with radiation seeds in 2007. Bladder cancer diagnosed in September. SPRT for right upper lobe lung mass in 2017. EXAM: CT CHEST, ABDOMEN, AND PELVIS WITH CONTRAST TECHNIQUE: Multidetector CT imaging of the chest, abdomen and pelvis was performed following the standard protocol during bolus administration of intravenous contrast. CONTRAST:  79mL OMNIPAQUE IOHEXOL 300 MG/ML  SOLN  COMPARISON:  Pelvic CT of 03/26/2018. Most recent abdominopelvic CT of 11/14/2017. PET of 12/12/2017. Chest CT of 07/27/2017. FINDINGS: CT CHEST FINDINGS Cardiovascular: Right Port-A-Cath tip at low SVC. Aortic atherosclerosis. Normal heart size, without pericardial effusion. Multivessel coronary artery atherosclerosis. No central pulmonary embolism, on this non-dedicated study. Mediastinum/Nodes: No mediastinal or hilar adenopathy. Lungs/Pleura: No pleural fluid. Probable secretions in the dependent mainstem bronchi bilaterally. Advanced bullous type emphysema. Posterior right upper lobe volume loss and consolidation measure on the order of 3.6 by 1.8 cm today versus 3.9 x 1.9 cm on the prior exam (when remeasured). New right middle lobe and lingular consolidation with air bronchograms and volume loss. Lateral left upper lobe mild airspace disease and air bronchograms, new on image 102/3. A left lower lobe pulmonary nodule measures 5 mm on image 120/3, new since 07/27/2017. A 4 mm superior segment left lower lobe pulmonary nodule on image 84/3 and a posterior left upper lobe 5 mm nodule on image 73/3 are both similar. Musculoskeletal: No acute osseous abnormality. Lower cervical spondylosis. CT ABDOMEN PELVIS FINDINGS Hepatobiliary: Normal liver. Normal gallbladder, without biliary ductal dilatation. Pancreas: Normal, without mass or ductal dilatation. Spleen: Normal in size, without focal abnormality. Adrenals/Urinary Tract: Normal right adrenal gland. Mild left adrenal thickening. An upper pole left renal lesion is too small to characterize. No hydronephrosis. Since the pelvic CT of 03/26/2018, the Foley catheter has been removed. There is persistent area either an or inferior to the caudal most aspect of the bladder, including on image 110/2. Stomach/Bowel: Normal stomach, without wall thickening. Anal/rectal wall thickening adjacent to the bladder is improved to resolved, including on image 112/2. Scattered  colonic diverticula. Colonic stool burden suggests constipation. Normal terminal ileum. Normal small bowel. Vascular/Lymphatic: Aortic and branch vessel atherosclerosis. No abdominopelvic adenopathy. Reproductive: Radiation seeds within the prostate. Right larger than left hydroceles, incompletely and suboptimally imaged. Other: No significant free fluid.  No free intraperitoneal air. Musculoskeletal: No acute osseous abnormality. Lumbosacral spondylosis. IMPRESSION: 1. Since the pelvic CT of 03/26/2018, removal of Foley catheter. Persistent air either within or inferior to the urinary bladder (in the region of the vesicourethral junction). Unless there has been recent instrumentation, this is suspicious for a fistulous communication from the bladder base/urethra to the rectum or anus. The extent of adjacent anorectal wall thickening is decreased. 2. Although limited by paucity of abdominopelvic fat, no evidence of abdominopelvic adenopathy to suggest metastatic disease. 3. New left upper lobe, right middle lobe, and lingular pulmonary opacities, suspicious for infection. 4. Left-sided pulmonary nodules  are primarily similar. A left lower lobe 5 mm nodule is new and warrants follow-up attention. 5. Aortic atherosclerosis (ICD10-I70.0), coronary artery atherosclerosis and emphysema (ICD10-J43.9). 6.  Possible constipation. 7. Similar consolidation and architectural distortion in the posterior right apex. Electronically Signed   By: Abigail Miyamoto M.D.   On: 05/25/2018 14:41    PERFORMANCE STATUS (ECOG) : 1  Review of Systems As noted above. Otherwise, a complete review of systems is negative.  Physical Exam General: NAD, frail appearing, thin Pulmonary: unlabored Extremities: no edema Skin: no rashes Neurological: Weakness but otherwise nonfocal  IMPRESSION: Routine follow-up visit today with patient to discuss pain management.  Patient was seen while he was receiving treatment in the infusion area.    Patient says that pain is markedly improved.  He denies any pain at present.  Patient says he is taking the Norco just as needed and has not required any in the past several days.  Patient denies any other distressing symptoms or acute changes.  He feels he is coping well.  Weight is low but stable at 107 pounds.  Patient is not routinely using oral supplements.  I encouraged him to drink at least one a day.  Would recommend referral to dietitian in the setting of weight loss.  I have previously reviewed with patient ACP documents and a MOST form. Will plan to complete when I can speak with him privately in an exam room.   Case and plan discussed with Dr. [name].   PLAN: -Continue current scope of treatment -Continue Norco 5-325mg  Q6H prn for pain -ACP/MOST form when he returns to the clinic -RTC in 2-3 weeks   Patient expressed understanding and was in agreement with this plan. He also understands that He can call clinic at any time with any questions, concerns, or complaints.     Time Total: 15 minutes  Visit consisted of counseling and education dealing with the complex and emotionally intense issues of symptom management and palliative care in the setting of serious and potentially life-threatening illness.Greater than 50%  of this time was spent counseling and coordinating care related to the above assessment and plan.  Signed by: Altha Harm, PhD, NP-C [phone] (Work Cell)

## 2018-05-30 NOTE — Progress Notes (Signed)
Collin Gonzales  [phone] [phone]  ID: Collin Gonzales OB: 03-07-1935  MR#: [id number]  [id number]#:[id number]  Patient Care Team: [name], MD as PCP - General (Family Medicine) [name], MD as Referring Physician (Ophthalmology) [name], MD as Consulting Physician (Rheumatology) [name], MD as Referring Physician (Specialist) [name], MD as Consulting Physician (Urology) [name], MD as Referring Physician (Radiation Oncology)  CHIEF COMPLAINT: Locally advanced high-grade urothelial carcinoma.    INTERVAL HISTORY: Patient returns to clinic today for further evaluation, discussion of his imaging results, and reconsideration of cycle 8 of Tecentriq.  He recently had a port placed and tolerated the procedure well without significant side effects.  He does not complain of pain today.  He currently feels well and is asymptomatic. He has no neurologic complaints.  He denies any recent fevers or illnesses.  He has chronic shortness of breath, but denies any chest pain, hemoptysis, or cough.  He has no nausea, vomiting, constipation, or diarrhea.  He has no urinary complaints.  Patient feels at his baseline offers no further specific complaints today.  REVIEW OF SYSTEMS:   Review of Systems  Constitutional: Negative.  Negative for fever, malaise/fatigue and weight loss.  Respiratory: Positive for shortness of breath. Negative for cough and hemoptysis.   Cardiovascular: Negative.  Negative for chest pain and leg swelling.  Gastrointestinal: Negative.  Negative for abdominal pain, blood in stool and melena.  Genitourinary: Negative.  Negative for dysuria and hematuria.  Musculoskeletal: Negative.  Negative for back pain.  Skin: Negative.  Negative for rash.  Neurological: Negative.  Negative for sensory change, focal weakness, weakness and headaches.  Psychiatric/Behavioral: Negative.  The patient is  not nervous/anxious.     As per HPI. Otherwise, a complete review of systems is negative.  PAST MEDICAL HISTORY: Past Medical History:  Diagnosis Date   Arthritis    Collagen vascular disease (HCC)    rheumatoid arthritis   COPD (chronic obstructive pulmonary disease) (HCC)    GERD (gastroesophageal reflux disease)    History of prostate cancer    HLD (hyperlipidemia)    HOH (hard of hearing)    Insomnia    Oxygen deficit    2L HS AND PRN   Primary cancer of right lung ([place]) 2017   rad tx's.   Prostate cancer ([place]) 2006   Rad seed tx's.   Stroke [facility])    2000   Urothelial carcinoma of bladder ([place]) 10/2017   TURP to Prostate and chemo tx's.    PAST SURGICAL HISTORY: Past Surgical History:  Procedure Laterality Date   APPENDECTOMY  1965   CATARACT EXTRACTION W/PHACO Right 01/05/2016   Procedure: CATARACT EXTRACTION PHACO AND INTRAOCULAR LENS PLACEMENT ([place]);  Surgeon: [name], MD;  Location: [facility] ORS;  Service: Ophthalmology;  Laterality: Right;  [place] 1.14 AP% 18.1 CDE 13.44 FLUID PACK LOT # [id number]   CYSTOSCOPY W/ RETROGRADES Bilateral 11/01/2017   Procedure: CYSTOSCOPY WITH RETROGRADE PYELOGRAM;  Surgeon: Hollice Espy, MD;  Location: [facility] ORS;  Service: Urology;  Laterality: Bilateral;   ENDOBRONCHIAL ULTRASOUND N/A 02/26/2015   Procedure: ENDOBRONCHIAL ULTRASOUND;  Surgeon: Flora Lipps, MD;  Location: [facility] ORS;  Service: Cardiopulmonary;  Laterality: N/A;   [place] RADIATION SEED  2002   PORTA CATH INSERTION N/A 05/14/2018   Procedure: PORTA CATH INSERTION;  Surgeon: Algernon Huxley, MD;  Location: [place] [facility];  Service: Cardiovascular;  Laterality: N/A;  TRANSURETHRAL RESECTION OF PROSTATE N/A 11/01/2017   Procedure: TRANSURETHRAL RESECTION OF THE PROSTATE (TURP) (channel TURP);  Surgeon: Hollice Espy, MD;  Location: [facility] ORS;  Service: Urology;  Laterality: N/A;    FAMILY HISTORY: Family  History  Problem Relation Age of Onset   Cancer Mother    Cancer Father        Lung Cancer   Cancer Sister        breast   Cancer Brother        stomach    ADVANCED DIRECTIVES (Y/N):  N  HEALTH MAINTENANCE: Social History   Tobacco Use   Smoking status: Former Smoker    Packs/day: 0.50    Years: 50.00    Pack years: 25.00    Types: Cigarettes    Last attempt to quit: 02/21/2013    Years since quitting: 5.2   Smokeless tobacco: Never Used   Tobacco comment: smoking cessation materials not required  Substance Use Topics   Alcohol use: No    Alcohol/week: 0.0 standard drinks   Drug use: No     Colonoscopy:  PAP:  Bone density:  Lipid panel:  Allergies  Allergen Reactions   Plaquenil [Hydroxychloroquine]     Bad dreams   Simvastatin Other (See Comments)    Bad dreams    Current Outpatient Medications  Medication Sig Dispense Refill   finasteride (PROSCAR) 5 MG tablet Take 5 mg by mouth daily.  11   Fluticasone-Umeclidin-Vilant (TRELEGY ELLIPTA) 100-62.5-25 MCG/INH AEPB Inhale 1 puff into the lungs daily. 60 each 5   folic acid (FOLVITE) 1 MG tablet Take 1 tablet (1 mg total) by mouth daily. 30 tablet 5   HYDROcodone-acetaminophen (NORCO) 5-325 MG tablet Take 1 tablet by mouth every 6 (six) hours as needed for moderate pain. 30 tablet 0   megestrol (MEGACE) 20 MG tablet Take 1 tablet (20 mg total) by mouth 2 (two) times daily. 180 tablet 1   methotrexate (RHEUMATREX) 2.5 MG tablet Take 5 tablets by mouth once a week.     OXYGEN Place 2 L into the nose daily as needed.      prochlorperazine (COMPAZINE) 10 MG tablet Take 1 tablet (10 mg total) by mouth every 6 (six) hours as needed (Nausea or vomiting). 60 tablet 2   QUEtiapine (SEROQUEL) 50 MG tablet Take 1 tablet (50 mg total) by mouth at bedtime. 90 tablet 0   tamsulosin (FLOMAX) 0.4 MG CAPS capsule Take 1 capsule (0.4 mg total) by mouth daily. (Patient taking differently: Take 0.8 mg by mouth  daily. ) 30 capsule 11   levofloxacin (LEVAQUIN) 500 MG tablet Take 1 tablet (500 mg total) by mouth daily. 7 tablet 0   No current facility-administered medications for this visit.     OBJECTIVE: Vitals:   05/30/18 1024  BP: (!) 96/58  Pulse: 93  Temp: 98.1 F (36.7 C)     Body mass index is 17.27 kg/m.    ECOG FS:0 - Asymptomatic  General: Well-developed, well-nourished, no acute distress. Eyes: Pink conjunctiva, anicteric sclera. HEENT: Normocephalic, moist mucous membranes. Lungs: Clear to auscultation bilaterally. Heart: Regular rate and rhythm. No rubs, murmurs, or gallops. Abdomen: Soft, nontender, nondistended. No organomegaly noted, normoactive bowel sounds. Musculoskeletal: No edema, cyanosis, or clubbing. Neuro: Alert, answering all questions appropriately. Cranial nerves grossly intact. Skin: No rashes or petechiae noted. Psych: Normal affect.  LAB RESULTS:  Lab Results  Component Value Date   NA 137 05/30/2018   K 3.8 05/30/2018   CL  107 05/30/2018   CO2 27 05/30/2018   GLUCOSE 92 05/30/2018   BUN 22 05/30/2018   CREATININE 1.38 (H) 05/30/2018   CALCIUM 8.2 (L) 05/30/2018   PROT 7.7 05/30/2018   ALBUMIN 2.2 (L) 05/30/2018   AST 16 05/30/2018   ALT 9 05/30/2018   ALKPHOS 68 05/30/2018   BILITOT 0.2 (L) 05/30/2018   GFRNONAA 48 (L) 05/30/2018   GFRAA 56 (L) 05/30/2018    Lab Results  Component Value Date   WBC 5.0 05/30/2018   NEUTROABS 2.7 05/30/2018   HGB 9.2 (L) 05/30/2018   HCT 31.0 (L) 05/30/2018   MCV 93.1 05/30/2018   PLT 373 05/30/2018     STUDIES: Ct Chest W Contrast  Result Date: 05/25/2018 CLINICAL DATA:  Prostate cancer with radiation seeds in 2007. Bladder cancer diagnosed in September. SPRT for right upper lobe lung mass in 2017. EXAM: CT CHEST, ABDOMEN, AND PELVIS WITH CONTRAST TECHNIQUE: Multidetector CT imaging of the chest, abdomen and pelvis was performed following the standard protocol during bolus administration of  intravenous contrast. CONTRAST:  66mL OMNIPAQUE IOHEXOL 300 MG/ML  SOLN COMPARISON:  Pelvic CT of 03/26/2018. Most recent abdominopelvic CT of 11/14/2017. PET of 12/12/2017. Chest CT of 07/27/2017. FINDINGS: CT CHEST FINDINGS Cardiovascular: Right Port-A-Cath tip at low SVC. Aortic atherosclerosis. Normal heart size, without pericardial effusion. Multivessel coronary artery atherosclerosis. No central pulmonary embolism, on this non-dedicated study. Mediastinum/Nodes: No mediastinal or hilar adenopathy. Lungs/Pleura: No pleural fluid. Probable secretions in the dependent mainstem bronchi bilaterally. Advanced bullous type emphysema. Posterior right upper lobe volume loss and consolidation measure on the order of 3.6 by 1.8 cm today versus 3.9 x 1.9 cm on the prior exam (when remeasured). New right middle lobe and lingular consolidation with air bronchograms and volume loss. Lateral left upper lobe mild airspace disease and air bronchograms, new on image 102/3. A left lower lobe pulmonary nodule measures 5 mm on image 120/3, new since 07/27/2017. A 4 mm superior segment left lower lobe pulmonary nodule on image 84/3 and a posterior left upper lobe 5 mm nodule on image 73/3 are both similar. Musculoskeletal: No acute osseous abnormality. Lower cervical spondylosis. CT ABDOMEN PELVIS FINDINGS Hepatobiliary: Normal liver. Normal gallbladder, without biliary ductal dilatation. Pancreas: Normal, without mass or ductal dilatation. Spleen: Normal in size, without focal abnormality. Adrenals/Urinary Tract: Normal right adrenal gland. Mild left adrenal thickening. An upper pole left renal lesion is too small to characterize. No hydronephrosis. Since the pelvic CT of 03/26/2018, the Foley catheter has been removed. There is persistent area either an or inferior to the caudal most aspect of the bladder, including on image 110/2. Stomach/Bowel: Normal stomach, without wall thickening. Anal/rectal wall thickening adjacent to the  bladder is improved to resolved, including on image 112/2. Scattered colonic diverticula. Colonic stool burden suggests constipation. Normal terminal ileum. Normal small bowel. Vascular/Lymphatic: Aortic and branch vessel atherosclerosis. No abdominopelvic adenopathy. Reproductive: Radiation seeds within the prostate. Right larger than left hydroceles, incompletely and suboptimally imaged. Other: No significant free fluid.  No free intraperitoneal air. Musculoskeletal: No acute osseous abnormality. Lumbosacral spondylosis. IMPRESSION: 1. Since the pelvic CT of 03/26/2018, removal of Foley catheter. Persistent air either within or inferior to the urinary bladder (in the region of the vesicourethral junction). Unless there has been recent instrumentation, this is suspicious for a fistulous communication from the bladder base/urethra to the rectum or anus. The extent of adjacent anorectal wall thickening is decreased. 2. Although limited by paucity of abdominopelvic fat, no evidence of  abdominopelvic adenopathy to suggest metastatic disease. 3. New left upper lobe, right middle lobe, and lingular pulmonary opacities, suspicious for infection. 4. Left-sided pulmonary nodules are primarily similar. A left lower lobe 5 mm nodule is new and warrants follow-up attention. 5. Aortic atherosclerosis (ICD10-I70.0), coronary artery atherosclerosis and emphysema (ICD10-J43.9). 6.  Possible constipation. 7. Similar consolidation and architectural distortion in the posterior right apex. Electronically Signed   By: Abigail Miyamoto M.D.   On: 05/25/2018 14:41   Ct Abdomen Pelvis W Contrast  Result Date: 05/25/2018 CLINICAL DATA:  Prostate cancer with radiation seeds in 2007. Bladder cancer diagnosed in September. SPRT for right upper lobe lung mass in 2017. EXAM: CT CHEST, ABDOMEN, AND PELVIS WITH CONTRAST TECHNIQUE: Multidetector CT imaging of the chest, abdomen and pelvis was performed following the standard protocol during bolus  administration of intravenous contrast. CONTRAST:  2mL OMNIPAQUE IOHEXOL 300 MG/ML  SOLN COMPARISON:  Pelvic CT of 03/26/2018. Most recent abdominopelvic CT of 11/14/2017. PET of 12/12/2017. Chest CT of 07/27/2017. FINDINGS: CT CHEST FINDINGS Cardiovascular: Right Port-A-Cath tip at low SVC. Aortic atherosclerosis. Normal heart size, without pericardial effusion. Multivessel coronary artery atherosclerosis. No central pulmonary embolism, on this non-dedicated study. Mediastinum/Nodes: No mediastinal or hilar adenopathy. Lungs/Pleura: No pleural fluid. Probable secretions in the dependent mainstem bronchi bilaterally. Advanced bullous type emphysema. Posterior right upper lobe volume loss and consolidation measure on the order of 3.6 by 1.8 cm today versus 3.9 x 1.9 cm on the prior exam (when remeasured). New right middle lobe and lingular consolidation with air bronchograms and volume loss. Lateral left upper lobe mild airspace disease and air bronchograms, new on image 102/3. A left lower lobe pulmonary nodule measures 5 mm on image 120/3, new since 07/27/2017. A 4 mm superior segment left lower lobe pulmonary nodule on image 84/3 and a posterior left upper lobe 5 mm nodule on image 73/3 are both similar. Musculoskeletal: No acute osseous abnormality. Lower cervical spondylosis. CT ABDOMEN PELVIS FINDINGS Hepatobiliary: Normal liver. Normal gallbladder, without biliary ductal dilatation. Pancreas: Normal, without mass or ductal dilatation. Spleen: Normal in size, without focal abnormality. Adrenals/Urinary Tract: Normal right adrenal gland. Mild left adrenal thickening. An upper pole left renal lesion is too small to characterize. No hydronephrosis. Since the pelvic CT of 03/26/2018, the Foley catheter has been removed. There is persistent area either an or inferior to the caudal most aspect of the bladder, including on image 110/2. Stomach/Bowel: Normal stomach, without wall thickening. Anal/rectal wall  thickening adjacent to the bladder is improved to resolved, including on image 112/2. Scattered colonic diverticula. Colonic stool burden suggests constipation. Normal terminal ileum. Normal small bowel. Vascular/Lymphatic: Aortic and branch vessel atherosclerosis. No abdominopelvic adenopathy. Reproductive: Radiation seeds within the prostate. Right larger than left hydroceles, incompletely and suboptimally imaged. Other: No significant free fluid.  No free intraperitoneal air. Musculoskeletal: No acute osseous abnormality. Lumbosacral spondylosis. IMPRESSION: 1. Since the pelvic CT of 03/26/2018, removal of Foley catheter. Persistent air either within or inferior to the urinary bladder (in the region of the vesicourethral junction). Unless there has been recent instrumentation, this is suspicious for a fistulous communication from the bladder base/urethra to the rectum or anus. The extent of adjacent anorectal wall thickening is decreased. 2. Although limited by paucity of abdominopelvic fat, no evidence of abdominopelvic adenopathy to suggest metastatic disease. 3. New left upper lobe, right middle lobe, and lingular pulmonary opacities, suspicious for infection. 4. Left-sided pulmonary nodules are primarily similar. A left lower lobe 5 mm nodule is  new and warrants follow-up attention. 5. Aortic atherosclerosis (ICD10-I70.0), coronary artery atherosclerosis and emphysema (ICD10-J43.9). 6.  Possible constipation. 7. Similar consolidation and architectural distortion in the posterior right apex. Electronically Signed   By: Abigail Miyamoto M.D.   On: 05/25/2018 14:41    ASSESSMENT: Locally advanced high-grade urothelial carcinoma.  PLAN:    1. Locally advanced high-grade urothelial carcinoma: Case discussed with urology, radiation oncology, as well as multidisciplinary tumor board.  Patient is not a surgical candidate nor can he receive additional XRT given his history of prostate cancer and brachii therapy  seed placement.  Though he has a decent performance status, patient is frail and likely would not tolerate cisplatin based therapy.  CT scan results from May 25, 2018 reviewed independently and reported as above with no obvious evidence of progressive disease.  Will continue palliative treatment with Tecentriq and consider referral back to urology once the COVID-19 pandemic as resolved. Previously, hospice and end-of-life were discussed but patient is not interested at this time.  Return to clinic in 3 weeks for further evaluation and consideration of cycle 9 of Tecentriq. 2.  Right upper lobe lung mass: Previously, although biopsy was negative, patient had a positive PET scan that was highly suspicious for underlying malignancy. He underwent SBRT in approximately May 2017.   3.  Shortness of breath: Chronic and unchanged.  Patient now requires oxygen 24 hours/day.   4.  Anemia: Patient's hemoglobin has improved to 9.2.  Monitor. 5.  Hematuria: Patient does not complain of this today.  Continue follow-up with urology as indicated. 6.  Hypokalemia: Resolved. 7.  Renal insufficiency: Patient's creatinine is 1.3 today.  Monitor closely.  Patient expressed understanding and was in agreement with this plan. He also understands that He can call clinic at any time with any questions, concerns, or complaints.    Lloyd Huger, MD   05/31/2018 6:47 AM

## 2018-05-31 LAB — THYROID PANEL WITH TSH
Free Thyroxine Index: 1.8 (ref 1.2–4.9)
T3 Uptake Ratio: 28 % (ref 24–39)
T4, Total: 6.5 ug/dL (ref 4.5–12.0)
TSH: 3.32 u[IU]/mL (ref 0.450–4.500)

## 2018-06-05 DIAGNOSIS — Z79899 Other long term (current) drug therapy: Secondary | ICD-10-CM | POA: Diagnosis not present

## 2018-06-05 DIAGNOSIS — M0579 Rheumatoid arthritis with rheumatoid factor of multiple sites without organ or systems involvement: Secondary | ICD-10-CM | POA: Diagnosis not present

## 2018-06-05 DIAGNOSIS — D638 Anemia in other chronic diseases classified elsewhere: Secondary | ICD-10-CM | POA: Diagnosis not present

## 2018-06-05 DIAGNOSIS — C679 Malignant neoplasm of bladder, unspecified: Secondary | ICD-10-CM | POA: Diagnosis not present

## 2018-06-07 DIAGNOSIS — J449 Chronic obstructive pulmonary disease, unspecified: Secondary | ICD-10-CM | POA: Diagnosis not present

## 2018-06-12 ENCOUNTER — Other Ambulatory Visit: Payer: Self-pay

## 2018-06-13 ENCOUNTER — Other Ambulatory Visit: Payer: Self-pay

## 2018-06-13 ENCOUNTER — Inpatient Hospital Stay (HOSPITAL_BASED_OUTPATIENT_CLINIC_OR_DEPARTMENT_OTHER): Payer: Medicare Other | Admitting: Oncology

## 2018-06-13 VITALS — BP 107/69 | HR 94 | Temp 97.4°F | Wt 115.0 lb

## 2018-06-13 DIAGNOSIS — C679 Malignant neoplasm of bladder, unspecified: Secondary | ICD-10-CM | POA: Diagnosis not present

## 2018-06-13 DIAGNOSIS — M069 Rheumatoid arthritis, unspecified: Secondary | ICD-10-CM

## 2018-06-13 DIAGNOSIS — D649 Anemia, unspecified: Secondary | ICD-10-CM | POA: Diagnosis not present

## 2018-06-13 DIAGNOSIS — E785 Hyperlipidemia, unspecified: Secondary | ICD-10-CM

## 2018-06-13 DIAGNOSIS — Z79899 Other long term (current) drug therapy: Secondary | ICD-10-CM | POA: Diagnosis not present

## 2018-06-13 DIAGNOSIS — C61 Malignant neoplasm of prostate: Secondary | ICD-10-CM

## 2018-06-13 DIAGNOSIS — Z7951 Long term (current) use of inhaled steroids: Secondary | ICD-10-CM | POA: Diagnosis not present

## 2018-06-13 DIAGNOSIS — N289 Disorder of kidney and ureter, unspecified: Secondary | ICD-10-CM | POA: Diagnosis not present

## 2018-06-13 DIAGNOSIS — Z8673 Personal history of transient ischemic attack (TIA), and cerebral infarction without residual deficits: Secondary | ICD-10-CM

## 2018-06-13 DIAGNOSIS — R918 Other nonspecific abnormal finding of lung field: Secondary | ICD-10-CM | POA: Diagnosis not present

## 2018-06-13 DIAGNOSIS — Z87891 Personal history of nicotine dependence: Secondary | ICD-10-CM

## 2018-06-13 DIAGNOSIS — K219 Gastro-esophageal reflux disease without esophagitis: Secondary | ICD-10-CM | POA: Diagnosis not present

## 2018-06-13 DIAGNOSIS — G4700 Insomnia, unspecified: Secondary | ICD-10-CM

## 2018-06-13 DIAGNOSIS — Z515 Encounter for palliative care: Secondary | ICD-10-CM

## 2018-06-13 DIAGNOSIS — Z5112 Encounter for antineoplastic immunotherapy: Secondary | ICD-10-CM | POA: Diagnosis not present

## 2018-06-13 DIAGNOSIS — J449 Chronic obstructive pulmonary disease, unspecified: Secondary | ICD-10-CM

## 2018-06-13 DIAGNOSIS — G893 Neoplasm related pain (acute) (chronic): Secondary | ICD-10-CM

## 2018-06-13 NOTE — Progress Notes (Signed)
Symptom Management Consult note [facility]  [phone] [phone]  Patient Care Team: [name], MD as PCP - General (Family Medicine) [name], MD as Referring Physician (Ophthalmology) [name], MD as Consulting Physician (Rheumatology) [name], MD as Referring Physician (Specialist) [name], MD as Consulting Physician (Urology) [name], MD as Referring Physician (Radiation Oncology)   Name of the patient: Collin Gonzales  [id number]  Oct 03, 1935   Date of visit: 06/13/2018  Diagnosis: locally advanced high-grade urothelial carcinoma  Chief Complaint: Palliative Follow-up  Current Treatment: S/p Cycle 8 Tecentriq. Last given 05/30/18  Oncology History:    Urothelial carcinoma of bladder ([place])   11/12/2017 Initial Diagnosis    Urothelial carcinoma of bladder ([place])    11/23/2017 -  Chemotherapy    The patient had atezolizumab (TECENTRIQ) 1,200 mg in sodium chloride 0.9 % 250 mL chemo infusion, 1,200 mg, Intravenous, Once, 8 of 12 cycles Administration: 1,200 mg (11/23/2017), 1,200 mg (12/14/2017), 1,200 mg (01/04/2018), 1,200 mg (01/25/2018), 1,200 mg (03/22/2018), 1,200 mg (02/28/2018), 1,200 mg (04/12/2018), 1,200 mg (05/30/2018)  for chemotherapy treatment.      Subjective Data: Mr. Garry is an 83 year old African-American male who was initially evaluated by palliative care nurse practitioner Vonna Kotyk borders on 05/29/2020 to help address goals.  Pain had markedly improved with PRN Norco 5-325 mg tablets.  He denied any distressing symptoms or acute changes.  Noted a low but stable weight at 107 pounds.  Not routinely using oral supplements.  He was evaluated by Dr. [name] on 05/30/2018 as well for evaluation prior to cycle 8 of Tecentriq.  Reviewed CT scan results from May 25, 2018 which revealed no obvious evidence of progression.  They continued with palliative Tecentriq.  Today,  Mr. Quinter presents for follow-up of his pain.  He states he has been doing "well".  States he only uses 1-2 Norco per week. States when he has pain it is typically in the lower pelvic region and resolves within 30 minutes of taking his pain medication.  Denies any new concerns today.  Weight is up 8 pounds (115 lbs).  Has been eating more often and attempting to incorporate protein rich foods and supplements.  Prefers vanilla or chocolate ensures.  He was provided several ensures and boost today in clinic.   His energy has improved.  He continues to complain of shortness of breath.  Wears oxygen PRN at home.   ECOG: 1 - Symptomatic but completely ambulatory   Past Medical History:  Diagnosis Date  . Arthritis   . Collagen vascular disease (HCC)    rheumatoid arthritis  . COPD (chronic obstructive pulmonary disease) ([place])   . GERD (gastroesophageal reflux disease)   . History of prostate cancer   . HLD (hyperlipidemia)   . HOH (hard of hearing)   . Insomnia   . Oxygen deficit    2L HS AND PRN  . Primary cancer of right lung ([place]) 2017   rad tx's.  . Prostate cancer ([place]) 2006   Rad seed tx's.  . Stroke ([place])    2000  . Urothelial carcinoma of bladder ([place]) 10/2017   TURP to Prostate and chemo tx's.    Past Surgical History:  Procedure Laterality Date  . APPENDECTOMY  1965  . CATARACT EXTRACTION W/PHACO Right 01/05/2016   Procedure: CATARACT EXTRACTION PHACO AND INTRAOCULAR LENS PLACEMENT (IOC);  Surgeon: [name], MD;  Location: [facility] ORS;  Service: Ophthalmology;  Laterality: Right;  [place]  1.14 AP% 18.1 CDE 13.44 FLUID PACK LOT # [id number] H  . CYSTOSCOPY W/ RETROGRADES Bilateral 11/01/2017   Procedure: CYSTOSCOPY WITH RETROGRADE PYELOGRAM;  Surgeon: Hollice Espy, MD;  Location: [facility] ORS;  Service: Urology;  Laterality: Bilateral;  . ENDOBRONCHIAL ULTRASOUND N/A 02/26/2015   Procedure: ENDOBRONCHIAL ULTRASOUND;  Surgeon: Flora Lipps, MD;  Location: [facility] OR[facility];  Service:  Cardiopulmonary;  Laterality: N/A;  . [place]  . INSERTION PROSTATE RADIATION SEED  2002  . PORTA CATH INSERTION N/A 05/14/2018   Procedure: PORTA CATH INSERTION;  Surgeon: Algernon Huxley, MD;  Location: [place] [facility];  Service: Cardiovascular;  Laterality: N/A;  . TRANSURETHRAL RESECTION OF PROSTATE N/A 11/01/2017   Procedure: TRANSURETHRAL RESECTION OF THE PROSTATE (TURP) (channel TURP);  Surgeon: Hollice Espy, MD;  Location: [facility];  Service: Urology;  Laterality: N/A;   Allergies  Allergen Reactions  . Plaquenil [Hydroxychloroquine]     Bad dreams  . Simvastatin Other (See Comments)    Bad dreams   There were no vitals filed for this visit.   Labs:   CBC Latest Ref Rng & Units 05/30/2018 05/09/2018 04/12/2018  WBC 4.0 - 10.5 K/uL 5.0 7.4 5.9  Hemoglobin 13.0 - 17.0 g/dL 9.2(L) 8.5(L) 9.8(L)  Hematocrit 39.0 - 52.0 % 31.0(L) 28.8(L) 32.9(L)  Platelets 150 - 400 K/uL 373 410(H) 348   CMP Latest Ref Rng & Units 05/30/2018 05/09/2018 04/12/2018  Glucose 70 - 99 mg/dL 92 169(H) 119(H)  BUN 8 - 23 mg/dL 22 22 26(H)  Creatinine 0.61 - 1.24 mg/dL 1.38(H) 0.99 1.18  Sodium 135 - 145 mmol/L 137 137 138  Potassium 3.5 - 5.1 mmol/L 3.8 3.5 4.2  Chloride 98 - 111 mmol/L 107 109 106  CO2 22 - 32 mmol/L 27 22 24   Calcium 8.9 - 10.3 mg/dL 8.2(L) 8.4(L) 8.7(L)  Total Protein 6.5 - 8.1 g/dL 7.7 8.3(H) 8.9(H)  Total Bilirubin 0.3 - 1.2 mg/dL 0.2(L) 0.4 0.3  Alkaline Phos 38 - 126 U/L 68 62 72  AST 15 - 41 U/L 16 23 20   ALT 0 - 44 U/L 9 16 12    Radiographic Studies:  CT Chest, Abdomen/Pelvis: 05/25/18  Although limited by paucity of abdominopelvic fat, no evidence of abdominopelvic adenopathy to suggest metastatic disease. 3. New left upper lobe, right middle lobe, and lingular pulmonary opacities, suspicious for infection. 4. Left-sided pulmonary nodules are primarily similar. A left lower lobe 5 mm nodule is new and warrants follow-up attention. 5. Aortic  atherosclerosis (ICD10-I70.0), coronary artery atherosclerosis and emphysema (ICD10-J43.9). 6.  Possible constipation. 7. Similar consolidation and architectural distortion in the posterior right apex.  Review of Systems  Constitutional: Positive for malaise/fatigue. Negative for chills, fever and weight loss.  HENT: Negative for congestion, ear pain and tinnitus.   Eyes: Negative.  Negative for blurred vision and double vision.  Respiratory: Positive for cough, sputum production and shortness of breath.   Cardiovascular: Negative.  Negative for chest pain, palpitations and leg swelling.  Gastrointestinal: Positive for abdominal pain. Negative for constipation, diarrhea, nausea and vomiting.  Genitourinary: Negative for dysuria, frequency and urgency.  Musculoskeletal: Negative for back pain and falls.  Skin: Negative.  Negative for rash.  Neurological: Negative.  Negative for weakness and headaches.  Endo/Heme/Allergies: Negative.  Does not bruise/bleed easily.  Psychiatric/Behavioral: Negative.  Negative for depression. The patient is not nervous/anxious and does not have insomnia.    Physical Exam Vitals signs and nursing note reviewed.  Constitutional:      Appearance:  Normal appearance.     Comments: Weight up 8 lbs  HENT:     Head: Normocephalic and atraumatic.  Eyes:     Pupils: Pupils are equal, round, and reactive to light.  Neck:     Musculoskeletal: Normal range of motion.  Cardiovascular:     Rate and Rhythm: Normal rate and regular rhythm.     Heart sounds: Normal heart sounds. No murmur.  Pulmonary:     Effort: Pulmonary effort is normal.     Breath sounds: Examination of the left-upper field reveals rhonchi. Decreased breath sounds and rhonchi present. No wheezing.  Abdominal:     General: Bowel sounds are normal. There is no distension.     Palpations: Abdomen is soft.     Tenderness: There is no abdominal tenderness.  Musculoskeletal: Normal range of motion.   Skin:    General: Skin is warm and dry.     Findings: No rash.  Neurological:     Mental Status: He is alert and oriented to person, place, and time.  Psychiatric:        Judgment: Judgment normal.   Impression: Routine follow-up visit today with patient to discuss pain management.  Patient was seen in private exam room.  Patient states his pain continues to improve.  He denies any pain presently.  He is taking his Norco as needed.  States he takes 1-2 weekly.  He denies any distressing symptoms or acute changes.  He continues to cope well.  Weight is up by 8 pounds.  Currently he weighs 115 pounds.  Trying to incorporate more frequent feedings as well as oral supplementations.  He prefers vanilla or chocolate ensures.  Have provided several ensures and boost for him today.  We discussed the most form.  Asked that he take this home with him for review and bring back at next scheduled visit.  Case and plan discussed with Dr. [name] and Vonna Kotyk borders, NP.  Plan: Continue current scope of treatment. Continue Norco 5-325 mg every 6 as needed for pain. Most form sent home with patient and will be discussed next week when he returns for cycle 9 Tecentriq.  Patient expressed understanding and was in agreement with this plan.  He also understands he can call clinic at any time with questions, concerns or complaints.  Time total: 15 minutes.  Greater than 50% was spent in counseling and coordination of care with this patient including but not limited to discussion of the relevant topics above (See A&P) including, but not limited to diagnosis and management of acute and chronic medical conditions.   Signed by: Faythe Casa, NP 06/13/2018 11:10 AM

## 2018-06-14 ENCOUNTER — Encounter: Payer: Self-pay | Admitting: Oncology

## 2018-06-18 NOTE — Progress Notes (Signed)
[place]  [phone] [phone]  ID: Collin Gonzales OB: January 02, 1936  MR#: [id number]  BHA#:[id number]  Patient Care Team: [name], MD as PCP - General (Family Medicine) [name], MD as Referring Physician (Ophthalmology) [name], MD as Consulting Physician (Rheumatology) [name], MD as Referring Physician (Specialist) [name], MD as Consulting Physician (Urology) [name], MD as Referring Physician (Radiation Oncology)  CHIEF COMPLAINT: Locally advanced high-grade urothelial carcinoma.    INTERVAL HISTORY: Patient returns to clinic today for further evaluation and consideration of cycle 9 of Tecentriq.  He currently feels well and is asymptomatic.  He does not complain of pain today. He has no neurologic complaints.  He denies any recent fevers or illnesses.  He has chronic shortness of breath, but denies any chest pain, hemoptysis, or cough.  He has no nausea, vomiting, constipation, or diarrhea.  He has occasional incontinence, but denies any hematuria or other urinary complaints.  Patient offers no further specific complaints today.  REVIEW OF SYSTEMS:   Review of Systems  Constitutional: Negative.  Negative for fever, malaise/fatigue and weight loss.  Respiratory: Positive for shortness of breath. Negative for cough and hemoptysis.   Cardiovascular: Negative.  Negative for chest pain and leg swelling.  Gastrointestinal: Negative.  Negative for abdominal pain, blood in stool and melena.  Genitourinary: Negative.  Negative for dysuria and hematuria.  Musculoskeletal: Negative.  Negative for back pain.  Skin: Negative.  Negative for rash.  Neurological: Negative.  Negative for sensory change, focal weakness, weakness and headaches.  Psychiatric/Behavioral: Negative.  The patient is not nervous/anxious.     As per HPI. Otherwise, a complete review of systems is negative.  PAST  MEDICAL HISTORY: Past Medical History:  Diagnosis Date   Arthritis    Collagen vascular disease (HCC)    rheumatoid arthritis   COPD (chronic obstructive pulmonary disease) (HCC)    GERD (gastroesophageal reflux disease)    History of prostate cancer    HLD (hyperlipidemia)    HOH (hard of hearing)    Insomnia    Oxygen deficit    2L HS AND PRN   Primary cancer of right lung ([place]) 2017   rad tx's.   Prostate cancer ([place]) 2006   Rad seed tx's.   Stroke [facility])    2000   Urothelial carcinoma of bladder ([place]) 10/2017   TURP to Prostate and chemo tx's.    PAST SURGICAL HISTORY: Past Surgical History:  Procedure Laterality Date   APPENDECTOMY  1965   CATARACT EXTRACTION W/PHACO Right 01/05/2016   Procedure: CATARACT EXTRACTION PHACO AND INTRAOCULAR LENS PLACEMENT ([place]);  Surgeon: [name], MD;  Location: [facility] ORS;  Service: Ophthalmology;  Laterality: Right;  [place] 1.14 AP% 18.1 CDE 13.44 FLUID PACK LOT # [id number]   CYSTOSCOPY W/ RETROGRADES Bilateral 11/01/2017   Procedure: CYSTOSCOPY WITH RETROGRADE PYELOGRAM;  Surgeon: Hollice Espy, MD;  Location: [facility] ORS;  Service: Urology;  Laterality: Bilateral;   ENDOBRONCHIAL ULTRASOUND N/A 02/26/2015   Procedure: ENDOBRONCHIAL ULTRASOUND;  Surgeon: Flora Lipps, MD;  Location: [facility] ORS;  Service: Cardiopulmonary;  Laterality: N/A;   [place] RADIATION SEED  2002   PORTA CATH INSERTION N/A 05/14/2018   Procedure: PORTA CATH INSERTION;  Surgeon: Algernon Huxley, MD;  Location: [place] [facility];  Service: Cardiovascular;  Laterality: N/A;   TRANSURETHRAL RESECTION OF PROSTATE N/A 11/01/2017   Procedure: TRANSURETHRAL RESECTION OF THE PROSTATE (TURP) (channel TURP);  Surgeon: Hollice Espy, MD;  Location: [facility] ORS;  Service: Urology;  Laterality: N/A;    FAMILY HISTORY: Family History  Problem Relation Age of Onset   Cancer Mother    Cancer Father        Lung Cancer    Cancer Sister        breast   Cancer Brother        stomach    ADVANCED DIRECTIVES (Y/N):  N  HEALTH MAINTENANCE: Social History   Tobacco Use   Smoking status: Former Smoker    Packs/day: 0.50    Years: 50.00    Pack years: 25.00    Types: Cigarettes    Last attempt to quit: 02/21/2013    Years since quitting: 5.3   Smokeless tobacco: Never Used   Tobacco comment: smoking cessation materials not required  Substance Use Topics   Alcohol use: No    Alcohol/week: 0.0 standard drinks   Drug use: No     Colonoscopy:  PAP:  Bone density:  Lipid panel:  Allergies  Allergen Reactions   Plaquenil [Hydroxychloroquine]     Bad dreams   Simvastatin Other (See Comments)    Bad dreams    Current Outpatient Medications  Medication Sig Dispense Refill   finasteride (PROSCAR) 5 MG tablet Take 5 mg by mouth daily.  11   Fluticasone-Umeclidin-Vilant (TRELEGY ELLIPTA) 100-62.5-25 MCG/INH AEPB Inhale 1 puff into the lungs daily. 60 each 5   folic acid (FOLVITE) 1 MG tablet Take 1 tablet (1 mg total) by mouth daily. 30 tablet 5   HYDROcodone-acetaminophen (NORCO) 5-325 MG tablet Take 1 tablet by mouth every 6 (six) hours as needed for moderate pain. 30 tablet 0   levofloxacin (LEVAQUIN) 500 MG tablet Take 1 tablet (500 mg total) by mouth daily. 7 tablet 0   megestrol (MEGACE) 20 MG tablet Take 1 tablet (20 mg total) by mouth 2 (two) times daily. 180 tablet 1   methotrexate (RHEUMATREX) 2.5 MG tablet Take 5 tablets by mouth once a week.     OXYGEN Place 2 L into the nose daily as needed.      prochlorperazine (COMPAZINE) 10 MG tablet Take 1 tablet (10 mg total) by mouth every 6 (six) hours as needed (Nausea or vomiting). 60 tablet 2   QUEtiapine (SEROQUEL) 50 MG tablet Take 1 tablet (50 mg total) by mouth at bedtime. 90 tablet 0   tamsulosin (FLOMAX) 0.4 MG CAPS capsule Take 1 capsule (0.4 mg total) by mouth daily. (Patient taking differently: Take 0.8 mg by mouth  daily. ) 30 capsule 11   traZODone (DESYREL) 100 MG tablet Take 1 tablet by mouth at bedtime.     No current facility-administered medications for this visit.    Facility-Administered Medications Ordered in Other Visits  Medication Dose Route Frequency Provider Last Rate Last Dose   atezolizumab (TECENTRIQ) 1,200 mg in sodium chloride 0.9 % 250 mL chemo infusion  1,200 mg Intravenous Once Lloyd Huger, MD 540 mL/hr at 06/20/18 1111 1,200 mg at 06/20/18 1111   [COMPLETED] heparin lock flush 100 unit/mL  500 Units Intravenous Once Lloyd Huger, MD   500 Units at 06/20/18 1145    OBJECTIVE: Vitals:   06/20/18 1003  BP: 114/65  Pulse: 78  Temp: 97.6 F (36.4 C)     Body mass index is 18.4 kg/m.    ECOG FS:0 - Asymptomatic  General: Well-developed, well-nourished, no acute distress. Eyes: Pink conjunctiva, anicteric sclera. HEENT: Normocephalic, moist mucous membranes.  Lungs: Clear to auscultation bilaterally. Heart: Regular rate and rhythm. No rubs, murmurs, or gallops. Abdomen: Soft, nontender, nondistended. No organomegaly noted, normoactive bowel sounds. Musculoskeletal: No edema, cyanosis, or clubbing. Neuro: Alert, answering all questions appropriately. Cranial nerves grossly intact. Skin: No rashes or petechiae noted. Psych: Normal affect.  LAB RESULTS:  Lab Results  Component Value Date   NA 138 06/20/2018   K 3.9 06/20/2018   CL 106 06/20/2018   CO2 26 06/20/2018   GLUCOSE 91 06/20/2018   BUN 23 06/20/2018   CREATININE 1.07 06/20/2018   CALCIUM 8.3 (L) 06/20/2018   PROT 7.7 06/20/2018   ALBUMIN 2.1 (L) 06/20/2018   AST 18 06/20/2018   ALT 8 06/20/2018   ALKPHOS 62 06/20/2018   BILITOT 0.2 (L) 06/20/2018   GFRNONAA >60 06/20/2018   GFRAA >60 06/20/2018    Lab Results  Component Value Date   WBC 6.9 06/20/2018   NEUTROABS 4.5 06/20/2018   HGB 8.4 (L) 06/20/2018   HCT 28.3 (L) 06/20/2018   MCV 91.3 06/20/2018   PLT 372 06/20/2018      STUDIES: Ct Chest W Contrast  Result Date: 05/25/2018 CLINICAL DATA:  Prostate cancer with radiation seeds in 2007. Bladder cancer diagnosed in September. SPRT for right upper lobe lung mass in 2017. EXAM: CT CHEST, ABDOMEN, AND PELVIS WITH CONTRAST TECHNIQUE: Multidetector CT imaging of the chest, abdomen and pelvis was performed following the standard protocol during bolus administration of intravenous contrast. CONTRAST:  51mL OMNIPAQUE IOHEXOL 300 MG/ML  SOLN COMPARISON:  Pelvic CT of 03/26/2018. Most recent abdominopelvic CT of 11/14/2017. PET of 12/12/2017. Chest CT of 07/27/2017. FINDINGS: CT CHEST FINDINGS Cardiovascular: Right Port-A-Cath tip at low SVC. Aortic atherosclerosis. Normal heart size, without pericardial effusion. Multivessel coronary artery atherosclerosis. No central pulmonary embolism, on this non-dedicated study. Mediastinum/Nodes: No mediastinal or hilar adenopathy. Lungs/Pleura: No pleural fluid. Probable secretions in the dependent mainstem bronchi bilaterally. Advanced bullous type emphysema. Posterior right upper lobe volume loss and consolidation measure on the order of 3.6 by 1.8 cm today versus 3.9 x 1.9 cm on the prior exam (when remeasured). New right middle lobe and lingular consolidation with air bronchograms and volume loss. Lateral left upper lobe mild airspace disease and air bronchograms, new on image 102/3. A left lower lobe pulmonary nodule measures 5 mm on image 120/3, new since 07/27/2017. A 4 mm superior segment left lower lobe pulmonary nodule on image 84/3 and a posterior left upper lobe 5 mm nodule on image 73/3 are both similar. Musculoskeletal: No acute osseous abnormality. Lower cervical spondylosis. CT ABDOMEN PELVIS FINDINGS Hepatobiliary: Normal liver. Normal gallbladder, without biliary ductal dilatation. Pancreas: Normal, without mass or ductal dilatation. Spleen: Normal in size, without focal abnormality. Adrenals/Urinary Tract: Normal right adrenal  gland. Mild left adrenal thickening. An upper pole left renal lesion is too small to characterize. No hydronephrosis. Since the pelvic CT of 03/26/2018, the Foley catheter has been removed. There is persistent area either an or inferior to the caudal most aspect of the bladder, including on image 110/2. Stomach/Bowel: Normal stomach, without wall thickening. Anal/rectal wall thickening adjacent to the bladder is improved to resolved, including on image 112/2. Scattered colonic diverticula. Colonic stool burden suggests constipation. Normal terminal ileum. Normal small bowel. Vascular/Lymphatic: Aortic and branch vessel atherosclerosis. No abdominopelvic adenopathy. Reproductive: Radiation seeds within the prostate. Right larger than left hydroceles, incompletely and suboptimally imaged. Other: No significant free fluid.  No free intraperitoneal air. Musculoskeletal: No acute osseous abnormality. Lumbosacral spondylosis. IMPRESSION:  1. Since the pelvic CT of 03/26/2018, removal of Foley catheter. Persistent air either within or inferior to the urinary bladder (in the region of the vesicourethral junction). Unless there has been recent instrumentation, this is suspicious for a fistulous communication from the bladder base/urethra to the rectum or anus. The extent of adjacent anorectal wall thickening is decreased. 2. Although limited by paucity of abdominopelvic fat, no evidence of abdominopelvic adenopathy to suggest metastatic disease. 3. New left upper lobe, right middle lobe, and lingular pulmonary opacities, suspicious for infection. 4. Left-sided pulmonary nodules are primarily similar. A left lower lobe 5 mm nodule is new and warrants follow-up attention. 5. Aortic atherosclerosis (ICD10-I70.0), coronary artery atherosclerosis and emphysema (ICD10-J43.9). 6.  Possible constipation. 7. Similar consolidation and architectural distortion in the posterior right apex. Electronically Signed   By: Abigail Miyamoto M.D.    On: 05/25/2018 14:41   Ct Abdomen Pelvis W Contrast  Result Date: 05/25/2018 CLINICAL DATA:  Prostate cancer with radiation seeds in 2007. Bladder cancer diagnosed in September. SPRT for right upper lobe lung mass in 2017. EXAM: CT CHEST, ABDOMEN, AND PELVIS WITH CONTRAST TECHNIQUE: Multidetector CT imaging of the chest, abdomen and pelvis was performed following the standard protocol during bolus administration of intravenous contrast. CONTRAST:  69mL OMNIPAQUE IOHEXOL 300 MG/ML  SOLN COMPARISON:  Pelvic CT of 03/26/2018. Most recent abdominopelvic CT of 11/14/2017. PET of 12/12/2017. Chest CT of 07/27/2017. FINDINGS: CT CHEST FINDINGS Cardiovascular: Right Port-A-Cath tip at low SVC. Aortic atherosclerosis. Normal heart size, without pericardial effusion. Multivessel coronary artery atherosclerosis. No central pulmonary embolism, on this non-dedicated study. Mediastinum/Nodes: No mediastinal or hilar adenopathy. Lungs/Pleura: No pleural fluid. Probable secretions in the dependent mainstem bronchi bilaterally. Advanced bullous type emphysema. Posterior right upper lobe volume loss and consolidation measure on the order of 3.6 by 1.8 cm today versus 3.9 x 1.9 cm on the prior exam (when remeasured). New right middle lobe and lingular consolidation with air bronchograms and volume loss. Lateral left upper lobe mild airspace disease and air bronchograms, new on image 102/3. A left lower lobe pulmonary nodule measures 5 mm on image 120/3, new since 07/27/2017. A 4 mm superior segment left lower lobe pulmonary nodule on image 84/3 and a posterior left upper lobe 5 mm nodule on image 73/3 are both similar. Musculoskeletal: No acute osseous abnormality. Lower cervical spondylosis. CT ABDOMEN PELVIS FINDINGS Hepatobiliary: Normal liver. Normal gallbladder, without biliary ductal dilatation. Pancreas: Normal, without mass or ductal dilatation. Spleen: Normal in size, without focal abnormality. Adrenals/Urinary Tract:  Normal right adrenal gland. Mild left adrenal thickening. An upper pole left renal lesion is too small to characterize. No hydronephrosis. Since the pelvic CT of 03/26/2018, the Foley catheter has been removed. There is persistent area either an or inferior to the caudal most aspect of the bladder, including on image 110/2. Stomach/Bowel: Normal stomach, without wall thickening. Anal/rectal wall thickening adjacent to the bladder is improved to resolved, including on image 112/2. Scattered colonic diverticula. Colonic stool burden suggests constipation. Normal terminal ileum. Normal small bowel. Vascular/Lymphatic: Aortic and branch vessel atherosclerosis. No abdominopelvic adenopathy. Reproductive: Radiation seeds within the prostate. Right larger than left hydroceles, incompletely and suboptimally imaged. Other: No significant free fluid.  No free intraperitoneal air. Musculoskeletal: No acute osseous abnormality. Lumbosacral spondylosis. IMPRESSION: 1. Since the pelvic CT of 03/26/2018, removal of Foley catheter. Persistent air either within or inferior to the urinary bladder (in the region of the vesicourethral junction). Unless there has been recent instrumentation, this is  suspicious for a fistulous communication from the bladder base/urethra to the rectum or anus. The extent of adjacent anorectal wall thickening is decreased. 2. Although limited by paucity of abdominopelvic fat, no evidence of abdominopelvic adenopathy to suggest metastatic disease. 3. New left upper lobe, right middle lobe, and lingular pulmonary opacities, suspicious for infection. 4. Left-sided pulmonary nodules are primarily similar. A left lower lobe 5 mm nodule is new and warrants follow-up attention. 5. Aortic atherosclerosis (ICD10-I70.0), coronary artery atherosclerosis and emphysema (ICD10-J43.9). 6.  Possible constipation. 7. Similar consolidation and architectural distortion in the posterior right apex. Electronically Signed   By:  Abigail Miyamoto M.D.   On: 05/25/2018 14:41    ASSESSMENT: Locally advanced high-grade urothelial carcinoma.  PLAN:    1. Locally advanced high-grade urothelial carcinoma: Case discussed with urology, radiation oncology, as well as multidisciplinary tumor board.  Patient is not a surgical candidate nor can he receive additional XRT given his history of prostate cancer and brachii therapy seed placement.  Though he has a decent performance status, patient is frail and likely would not tolerate cisplatin based therapy.  CT scan results from May 25, 2018 reviewed independently and reported as above with no obvious evidence of progressive disease.  Will continue palliative treatment with Tecentriq and consider referral back to urology once the COVID-19 pandemic as resolved. Previously, hospice and end-of-life were discussed but patient is not interested at this time.  Proceed with cycle 9 of treatment today.  Return to clinic in 3 weeks for further evaluation and consideration of cycle 10 of Tecentriq.  2.  Right upper lobe lung mass: Previously, although biopsy was negative, patient had a positive PET scan that was highly suspicious for underlying malignancy. He underwent SBRT in approximately May 2017.   3.  Shortness of breath: Chronic and unchanged.  Patient now requires oxygen 24 hours/day.   4.  Anemia: Patient hemoglobin is trending down and is now 8.4.  Monitor. 5.  Hematuria: Patient does not complain of this today.  Continue follow-up with urology as indicated. 6.  Renal insufficiency: Patient's creatinine is within normal limits today.  Monitor.  Patient expressed understanding and was in agreement with this plan. He also understands that He can call clinic at any time with any questions, concerns, or complaints.    Lloyd Huger, MD   06/20/2018 11:36 AM

## 2018-06-19 ENCOUNTER — Other Ambulatory Visit: Payer: Self-pay

## 2018-06-20 ENCOUNTER — Inpatient Hospital Stay (HOSPITAL_BASED_OUTPATIENT_CLINIC_OR_DEPARTMENT_OTHER): Payer: Medicare Other | Admitting: Oncology

## 2018-06-20 ENCOUNTER — Encounter: Payer: Self-pay | Admitting: Oncology

## 2018-06-20 ENCOUNTER — Other Ambulatory Visit: Payer: Self-pay

## 2018-06-20 ENCOUNTER — Inpatient Hospital Stay: Payer: Medicare Other | Admitting: *Deleted

## 2018-06-20 ENCOUNTER — Inpatient Hospital Stay: Payer: Medicare Other | Admitting: Hospice and Palliative Medicine

## 2018-06-20 ENCOUNTER — Inpatient Hospital Stay: Payer: Medicare Other

## 2018-06-20 VITALS — BP 114/65 | HR 78 | Temp 97.6°F | Ht 66.0 in | Wt 114.0 lb

## 2018-06-20 DIAGNOSIS — J449 Chronic obstructive pulmonary disease, unspecified: Secondary | ICD-10-CM

## 2018-06-20 DIAGNOSIS — R918 Other nonspecific abnormal finding of lung field: Secondary | ICD-10-CM

## 2018-06-20 DIAGNOSIS — C61 Malignant neoplasm of prostate: Secondary | ICD-10-CM | POA: Diagnosis not present

## 2018-06-20 DIAGNOSIS — Z8673 Personal history of transient ischemic attack (TIA), and cerebral infarction without residual deficits: Secondary | ICD-10-CM

## 2018-06-20 DIAGNOSIS — Z7951 Long term (current) use of inhaled steroids: Secondary | ICD-10-CM | POA: Diagnosis not present

## 2018-06-20 DIAGNOSIS — D649 Anemia, unspecified: Secondary | ICD-10-CM | POA: Diagnosis not present

## 2018-06-20 DIAGNOSIS — E785 Hyperlipidemia, unspecified: Secondary | ICD-10-CM | POA: Diagnosis not present

## 2018-06-20 DIAGNOSIS — Z95828 Presence of other vascular implants and grafts: Secondary | ICD-10-CM

## 2018-06-20 DIAGNOSIS — M069 Rheumatoid arthritis, unspecified: Secondary | ICD-10-CM

## 2018-06-20 DIAGNOSIS — K219 Gastro-esophageal reflux disease without esophagitis: Secondary | ICD-10-CM | POA: Diagnosis not present

## 2018-06-20 DIAGNOSIS — N289 Disorder of kidney and ureter, unspecified: Secondary | ICD-10-CM | POA: Diagnosis not present

## 2018-06-20 DIAGNOSIS — C679 Malignant neoplasm of bladder, unspecified: Secondary | ICD-10-CM

## 2018-06-20 DIAGNOSIS — G893 Neoplasm related pain (acute) (chronic): Secondary | ICD-10-CM | POA: Diagnosis not present

## 2018-06-20 DIAGNOSIS — Z87891 Personal history of nicotine dependence: Secondary | ICD-10-CM | POA: Diagnosis not present

## 2018-06-20 DIAGNOSIS — D509 Iron deficiency anemia, unspecified: Secondary | ICD-10-CM

## 2018-06-20 DIAGNOSIS — Z79899 Other long term (current) drug therapy: Secondary | ICD-10-CM | POA: Diagnosis not present

## 2018-06-20 DIAGNOSIS — G4700 Insomnia, unspecified: Secondary | ICD-10-CM | POA: Diagnosis not present

## 2018-06-20 DIAGNOSIS — Z5112 Encounter for antineoplastic immunotherapy: Secondary | ICD-10-CM | POA: Diagnosis not present

## 2018-06-20 LAB — COMPREHENSIVE METABOLIC PANEL
ALT: 8 U/L (ref 0–44)
AST: 18 U/L (ref 15–41)
Albumin: 2.1 g/dL — ABNORMAL LOW (ref 3.5–5.0)
Alkaline Phosphatase: 62 U/L (ref 38–126)
Anion gap: 6 (ref 5–15)
BUN: 23 mg/dL (ref 8–23)
CO2: 26 mmol/L (ref 22–32)
Calcium: 8.3 mg/dL — ABNORMAL LOW (ref 8.9–10.3)
Chloride: 106 mmol/L (ref 98–111)
Creatinine, Ser: 1.07 mg/dL (ref 0.61–1.24)
GFR calc Af Amer: >60 mL/min (ref >60)
GFR calc non Af Amer: >60 mL/min (ref >60)
Glucose, Bld: 91 mg/dL (ref 70–99)
Potassium: 3.9 mmol/L (ref 3.5–5.1)
Sodium: 138 mmol/L (ref 135–145)
Total Bilirubin: 0.2 mg/dL — ABNORMAL LOW (ref 0.3–1.2)
Total Protein: 7.7 g/dL (ref 6.5–8.1)

## 2018-06-20 LAB — CBC WITH DIFFERENTIAL/PLATELET
Abs Immature Granulocytes: 0.07 10*3/uL (ref 0.00–0.07)
Basophils Absolute: 0 10*3/uL (ref 0.0–0.1)
Basophils Relative: 0 %
Eosinophils Absolute: 0.3 10*3/uL (ref 0.0–0.5)
Eosinophils Relative: 5 %
HCT: 28.3 % — ABNORMAL LOW (ref 39.0–52.0)
Hemoglobin: 8.4 g/dL — ABNORMAL LOW (ref 13.0–17.0)
Immature Granulocytes: 1 %
Lymphocytes Relative: 18 %
Lymphs Abs: 1.2 10*3/uL (ref 0.7–4.0)
MCH: 27.1 pg (ref 26.0–34.0)
MCHC: 29.7 g/dL — ABNORMAL LOW (ref 30.0–36.0)
MCV: 91.3 fL (ref 80.0–100.0)
Monocytes Absolute: 0.7 10*3/uL (ref 0.1–1.0)
Monocytes Relative: 10 %
Neutro Abs: 4.5 10*3/uL (ref 1.7–7.7)
Neutrophils Relative %: 66 %
Platelets: 372 10*3/uL (ref 150–400)
RBC: 3.1 MIL/uL — ABNORMAL LOW (ref 4.22–5.81)
RDW: 18.5 % — ABNORMAL HIGH (ref 11.5–15.5)
WBC: 6.9 10*3/uL (ref 4.0–10.5)
nRBC: 0 % (ref 0.0–0.2)

## 2018-06-20 MED ORDER — HEPARIN SOD (PORK) LOCK FLUSH 100 UNIT/ML IV SOLN
INTRAVENOUS | Status: AC
  Filled 2018-06-20: qty 5

## 2018-06-20 MED ORDER — HEPARIN SOD (PORK) LOCK FLUSH 100 UNIT/ML IV SOLN
500.0000 [IU] | Freq: Once | INTRAVENOUS | Status: AC
  Administered 2018-06-20: 500 [IU] via INTRAVENOUS

## 2018-06-20 MED ORDER — SODIUM CHLORIDE 0.9 % IV SOLN
1200.0000 mg | Freq: Once | INTRAVENOUS | Status: AC
  Administered 2018-06-20: 11:00:00 1200 mg via INTRAVENOUS
  Filled 2018-06-20: qty 20

## 2018-06-20 MED ORDER — SODIUM CHLORIDE 0.9% FLUSH
10.0000 mL | Freq: Once | INTRAVENOUS | Status: AC
  Administered 2018-06-20: 10 mL via INTRAVENOUS
  Filled 2018-06-20: qty 10

## 2018-06-20 MED ORDER — SODIUM CHLORIDE 0.9 % IV SOLN
Freq: Once | INTRAVENOUS | Status: AC
  Administered 2018-06-20: 11:00:00 via INTRAVENOUS
  Filled 2018-06-20: qty 250

## 2018-06-20 NOTE — Progress Notes (Signed)
Patient stated that he had been doing well with no complaints. Patient would like to know "how I'm I doing with my cancer".

## 2018-06-20 NOTE — Addendum Note (Signed)
Addended by: Wayna Chalet on: 06/20/2018 03:09 PM   Modules accepted: Orders

## 2018-06-21 ENCOUNTER — Other Ambulatory Visit: Payer: Self-pay | Admitting: Family Medicine

## 2018-06-21 DIAGNOSIS — G4709 Other insomnia: Secondary | ICD-10-CM

## 2018-06-21 LAB — THYROID PANEL WITH TSH
Free Thyroxine Index: 1.8 (ref 1.2–4.9)
T3 Uptake Ratio: 28 % (ref 24–39)
T4, Total: 6.4 ug/dL (ref 4.5–12.0)
TSH: 1.35 u[IU]/mL (ref 0.450–4.500)

## 2018-06-21 NOTE — Telephone Encounter (Signed)
Refill request for general medication: Seroquel 50 mg  Last office visit: 03/30/2018  Follow-ups on file. 07/20/2018

## 2018-06-28 ENCOUNTER — Telehealth: Payer: Self-pay | Admitting: Family Medicine

## 2018-06-28 NOTE — Telephone Encounter (Signed)
Copied from [place] [id number]. Topic: Quick Communication - See Telephone Encounter >> Jun 28, 2018 12:43 PM Vernona Rieger wrote: CRM for notification. See Telephone encounter for: 06/28/18.  Patient said he can not find his QUEtiapine (SEROQUEL) 50 MG tablet. He thinks his grand daughter threw away when cleaning out his car. He is unsure. He wants to know could he have another prescription?

## 2018-06-28 NOTE — Telephone Encounter (Signed)
Patient called again to check the status of his medication request

## 2018-06-29 ENCOUNTER — Other Ambulatory Visit: Payer: Self-pay | Admitting: Family Medicine

## 2018-06-29 DIAGNOSIS — G4709 Other insomnia: Secondary | ICD-10-CM

## 2018-06-29 NOTE — Telephone Encounter (Signed)
Spoke with pharmacist at [facility] and they are able to fill him Seroquel 25 mg with 180 tablets with him taking it bid for $3.55 under his insurance. The patient and his daughter was notified and will pick it up today.

## 2018-06-29 NOTE — Telephone Encounter (Signed)
Patient calling to check status of the possibility of getting a new prescription. States that he believes his grand daughter accidentally threw away the prescription while cleaning out his car. Please advise.

## 2018-07-07 DIAGNOSIS — J449 Chronic obstructive pulmonary disease, unspecified: Secondary | ICD-10-CM | POA: Diagnosis not present

## 2018-07-07 NOTE — Progress Notes (Signed)
Collin Gonzales  [phone] [phone]  ID: Collin Gonzales OB: June 16, 1935  MR#: [id number]  NAT#:[id number]  Patient Care Team: [name], MD as PCP - General (Family Medicine) [name], MD as Referring Physician (Ophthalmology) [name], MD as Consulting Physician (Rheumatology) [name], MD as Referring Physician (Specialist) [name], MD as Consulting Physician (Urology) [name], MD as Referring Physician (Radiation Oncology)  CHIEF COMPLAINT: Locally advanced high-grade urothelial carcinoma.    INTERVAL HISTORY: Patient returns to clinic today for further evaluation and consideration of cycle 10 is Tecentriq.  He continues to feel well and remains asymptomatic. He does not complain of pain today. He has no neurologic complaints.  He denies any recent fevers or illnesses.  He has chronic shortness of breath, but denies any chest pain, hemoptysis, or cough.  He has no nausea, vomiting, constipation, or diarrhea.  He has occasional incontinence, but denies any hematuria or other urinary complaints.  Patient feels at his baseline and offers no new specific complaints today.  REVIEW OF SYSTEMS:   Review of Systems  Constitutional: Negative.  Negative for fever, malaise/fatigue and weight loss.  Respiratory: Positive for shortness of breath. Negative for cough and hemoptysis.   Cardiovascular: Negative.  Negative for chest pain and leg swelling.  Gastrointestinal: Negative.  Negative for abdominal pain, blood in stool and melena.  Genitourinary: Negative.  Negative for dysuria and hematuria.  Musculoskeletal: Negative.  Negative for back pain.  Skin: Negative.  Negative for rash.  Neurological: Negative.  Negative for sensory change, focal weakness, weakness and headaches.  Psychiatric/Behavioral: Negative.  The patient is not nervous/anxious.     As per HPI. Otherwise, a complete review of  systems is negative.  PAST MEDICAL HISTORY: Past Medical History:  Diagnosis Date  . Arthritis   . Collagen vascular disease (HCC)    rheumatoid arthritis  . COPD (chronic obstructive pulmonary disease) ([place])   . GERD (gastroesophageal reflux disease)   . History of prostate cancer   . HLD (hyperlipidemia)   . HOH (hard of hearing)   . Insomnia   . Oxygen deficit    2L HS AND PRN  . Primary cancer of right lung ([place]) 2017   rad tx's.  . Prostate cancer ([name]) 2006   Rad seed tx's.  . Stroke ([place])    2000  . Urothelial carcinoma of bladder ([name]) 10/2017   TURP to Prostate and chemo tx's.    PAST SURGICAL HISTORY: Past Surgical History:  Procedure Laterality Date  . APPENDECTOMY  1965  . CATARACT EXTRACTION W/PHACO Right 01/05/2016   Procedure: CATARACT EXTRACTION PHACO AND INTRAOCULAR LENS PLACEMENT (IOC);  Surgeon: [name], MD;  Location: [facility];  Service: Ophthalmology;  Laterality: Right;  [place] 1.14 AP% 18.1 CDE 13.44 FLUID PACK LOT # [id number] H  . CYSTOSCOPY W/ RETROGRADES Bilateral 11/01/2017   Procedure: CYSTOSCOPY WITH RETROGRADE PYELOGRAM;  Surgeon: Hollice Espy, MD;  Location: [facility];  Service: Urology;  Laterality: Bilateral;  . ENDOBRONCHIAL ULTRASOUND N/A 02/26/2015   Procedure: ENDOBRONCHIAL ULTRASOUND;  Surgeon: Flora Lipps, MD;  Location: [facility];  Service: Cardiopulmonary;  Laterality: N/A;  . [place]  . INSERTION PROSTATE RADIATION SEED  2002  . PORTA CATH INSERTION N/A 05/14/2018   Procedure: PORTA CATH INSERTION;  Surgeon: Algernon Huxley, MD;  Location: [facility];  Service: Cardiovascular;  Laterality: N/A;  . TRANSURETHRAL RESECTION OF PROSTATE N/A 11/01/2017   Procedure: TRANSURETHRAL RESECTION OF  THE PROSTATE (TURP) (channel TURP);  Surgeon: Hollice Espy, MD;  Location: [facility];  Service: Urology;  Laterality: N/A;    FAMILY HISTORY: Family History  Problem Relation Age of Onset  . Cancer Mother   . Cancer Father         Lung Cancer  . Cancer Sister        breast  . Cancer Brother        stomach    ADVANCED DIRECTIVES (Y/N):  N  HEALTH MAINTENANCE: Social History   Tobacco Use  . Smoking status: Former Smoker    Packs/day: 0.50    Years: 50.00    Pack years: 25.00    Types: Cigarettes    Last attempt to quit: 02/21/2013    Years since quitting: 5.3  . Smokeless tobacco: Never Used  . Tobacco comment: smoking cessation materials not required  Substance Use Topics  . Alcohol use: No    Alcohol/week: 0.0 standard drinks  . Drug use: No     Colonoscopy:  PAP:  Bone density:  Lipid panel:  Allergies  Allergen Reactions  . Plaquenil [Hydroxychloroquine]     Bad dreams  . Simvastatin Other (See Comments)    Bad dreams    Current Outpatient Medications  Medication Sig Dispense Refill  . finasteride (PROSCAR) 5 MG tablet Take 5 mg by mouth daily.  11  . Fluticasone-Umeclidin-Vilant (TRELEGY ELLIPTA) 100-62.5-25 MCG/INH AEPB Inhale 1 puff into the lungs daily. 60 each 5  . folic acid (FOLVITE) 1 MG tablet Take 1 tablet (1 mg total) by mouth daily. 30 tablet 5  . HYDROcodone-acetaminophen (NORCO) 5-325 MG tablet Take 1 tablet by mouth every 6 (six) hours as needed for moderate pain. 30 tablet 0  . megestrol (MEGACE) 20 MG tablet Take 1 tablet (20 mg total) by mouth 2 (two) times daily. 180 tablet 1  . methotrexate (RHEUMATREX) 2.5 MG tablet Take 5 tablets by mouth once a week.    . OXYGEN Place 2 L into the nose daily as needed.     . prochlorperazine (COMPAZINE) 10 MG tablet Take 1 tablet (10 mg total) by mouth every 6 (six) hours as needed (Nausea or vomiting). 60 tablet 2  . tamsulosin (FLOMAX) 0.4 MG CAPS capsule Take 1 capsule (0.4 mg total) by mouth daily. (Patient taking differently: Take 0.8 mg by mouth daily. ) 30 capsule 11  . traZODone (DESYREL) 100 MG tablet Take 1 tablet by mouth at bedtime.    Marland Kitchen QUEtiapine (SEROQUEL) 25 MG tablet Take 1 tablet by mouth 2 (two) times a day.      No current facility-administered medications for this visit.     OBJECTIVE: Vitals:   07/11/18 0945  BP: (!) 90/57  Pulse: 80  Temp: 98.3 F (36.8 C)     Body mass index is 19.05 kg/m.    ECOG FS:0 - Asymptomatic  General: Well-developed, well-nourished, no acute distress. Eyes: Pink conjunctiva, anicteric sclera. HEENT: Normocephalic, moist mucous membranes. Lungs: Clear to auscultation bilaterally. Heart: Regular rate and rhythm. No rubs, murmurs, or gallops. Abdomen: Soft, nontender, nondistended. No organomegaly noted, normoactive bowel sounds. Musculoskeletal: No edema, cyanosis, or clubbing. Neuro: Alert, answering all questions appropriately. Cranial nerves grossly intact. Skin: No rashes or petechiae noted. Psych: Normal affect.  LAB RESULTS:  Lab Results  Component Value Date   NA 138 07/11/2018   K 3.6 07/11/2018   CL 106 07/11/2018   CO2 23 07/11/2018   GLUCOSE 151 (H) 07/11/2018   BUN  25 (H) 07/11/2018   CREATININE 1.27 (H) 07/11/2018   CALCIUM 8.5 (L) 07/11/2018   PROT 8.2 (H) 07/11/2018   ALBUMIN 2.5 (L) 07/11/2018   AST 18 07/11/2018   ALT 8 07/11/2018   ALKPHOS 62 07/11/2018   BILITOT 0.5 07/11/2018   GFRNONAA 53 (L) 07/11/2018   GFRAA >60 07/11/2018    Lab Results  Component Value Date   WBC 7.1 07/11/2018   NEUTROABS 5.3 07/11/2018   HGB 9.3 (L) 07/11/2018   HCT 31.1 (L) 07/11/2018   MCV 91.2 07/11/2018   PLT 294 07/11/2018     STUDIES: No results found.  ASSESSMENT: Locally advanced high-grade urothelial carcinoma.  PLAN:    1. Locally advanced high-grade urothelial carcinoma: Case discussed with urology, radiation oncology, as well as multidisciplinary tumor board.  Patient is not a surgical candidate nor can he receive additional XRT given his history of prostate cancer and brachii therapy seed placement.  Though he has a decent performance status, patient is frail and likely would not tolerate cisplatin based therapy.  CT  scan results from May 25, 2018 reviewed independently with no obvious evidence of progressive disease.  Will continue palliative treatment with Tecentriq and consider referral back to urology once restrictions are reduced for the COVID-19 pandemic. Previously, hospice and end-of-life were discussed but patient is not interested at this time.  Proceed with cycle 10 of treatment today.  Return to clinic in 3 weeks for further evaluation and consideration of cycle 11 of Tecentriq. 2.  Right upper lobe lung mass: Previously, although biopsy was negative, patient had a positive PET scan that was highly suspicious for underlying malignancy. He underwent SBRT in approximately May 2017.   3.  Shortness of breath: Chronic and unchanged.  Patient now requires oxygen 24 hours/day.   4.  Anemia: Hemoglobin slightly improved to 9.3 today. 5.  Hematuria: Patient does not complain of this today.  Continue follow-up with urology as indicated. 6.  Renal insufficiency: Patient's creatinine is slightly elevated today at 1.27.  Monitor.  Patient expressed understanding and was in agreement with this plan. He also understands that He can call clinic at any time with any questions, concerns, or complaints.    Lloyd Huger, MD   07/14/2018 7:59 AM

## 2018-07-10 ENCOUNTER — Other Ambulatory Visit: Payer: Self-pay

## 2018-07-11 ENCOUNTER — Other Ambulatory Visit: Payer: Self-pay

## 2018-07-11 ENCOUNTER — Inpatient Hospital Stay (HOSPITAL_BASED_OUTPATIENT_CLINIC_OR_DEPARTMENT_OTHER): Payer: Medicare Other | Admitting: Hospice and Palliative Medicine

## 2018-07-11 ENCOUNTER — Inpatient Hospital Stay (HOSPITAL_BASED_OUTPATIENT_CLINIC_OR_DEPARTMENT_OTHER): Payer: Medicare Other | Admitting: Oncology

## 2018-07-11 ENCOUNTER — Inpatient Hospital Stay: Payer: Medicare Other

## 2018-07-11 ENCOUNTER — Encounter: Payer: Self-pay | Admitting: Oncology

## 2018-07-11 ENCOUNTER — Inpatient Hospital Stay: Payer: Medicare Other | Attending: Oncology | Admitting: *Deleted

## 2018-07-11 VITALS — BP 98/57 | HR 85 | Resp 18

## 2018-07-11 VITALS — BP 90/57 | HR 80 | Temp 98.3°F | Ht 66.0 in | Wt 118.0 lb

## 2018-07-11 DIAGNOSIS — D509 Iron deficiency anemia, unspecified: Secondary | ICD-10-CM

## 2018-07-11 DIAGNOSIS — Z95828 Presence of other vascular implants and grafts: Secondary | ICD-10-CM

## 2018-07-11 DIAGNOSIS — E785 Hyperlipidemia, unspecified: Secondary | ICD-10-CM | POA: Insufficient documentation

## 2018-07-11 DIAGNOSIS — Z5112 Encounter for antineoplastic immunotherapy: Secondary | ICD-10-CM | POA: Diagnosis not present

## 2018-07-11 DIAGNOSIS — Z800 Family history of malignant neoplasm of digestive organs: Secondary | ICD-10-CM | POA: Insufficient documentation

## 2018-07-11 DIAGNOSIS — Z7951 Long term (current) use of inhaled steroids: Secondary | ICD-10-CM | POA: Diagnosis not present

## 2018-07-11 DIAGNOSIS — Z803 Family history of malignant neoplasm of breast: Secondary | ICD-10-CM | POA: Diagnosis not present

## 2018-07-11 DIAGNOSIS — Z7189 Other specified counseling: Secondary | ICD-10-CM

## 2018-07-11 DIAGNOSIS — N289 Disorder of kidney and ureter, unspecified: Secondary | ICD-10-CM | POA: Insufficient documentation

## 2018-07-11 DIAGNOSIS — Z87891 Personal history of nicotine dependence: Secondary | ICD-10-CM | POA: Diagnosis not present

## 2018-07-11 DIAGNOSIS — C679 Malignant neoplasm of bladder, unspecified: Secondary | ICD-10-CM

## 2018-07-11 DIAGNOSIS — Z8546 Personal history of malignant neoplasm of prostate: Secondary | ICD-10-CM | POA: Insufficient documentation

## 2018-07-11 DIAGNOSIS — J449 Chronic obstructive pulmonary disease, unspecified: Secondary | ICD-10-CM

## 2018-07-11 DIAGNOSIS — Z801 Family history of malignant neoplasm of trachea, bronchus and lung: Secondary | ICD-10-CM | POA: Diagnosis not present

## 2018-07-11 DIAGNOSIS — D649 Anemia, unspecified: Secondary | ICD-10-CM | POA: Insufficient documentation

## 2018-07-11 DIAGNOSIS — M069 Rheumatoid arthritis, unspecified: Secondary | ICD-10-CM | POA: Diagnosis not present

## 2018-07-11 DIAGNOSIS — Z8673 Personal history of transient ischemic attack (TIA), and cerebral infarction without residual deficits: Secondary | ICD-10-CM

## 2018-07-11 DIAGNOSIS — Z79899 Other long term (current) drug therapy: Secondary | ICD-10-CM | POA: Diagnosis not present

## 2018-07-11 DIAGNOSIS — Z515 Encounter for palliative care: Secondary | ICD-10-CM

## 2018-07-11 DIAGNOSIS — G4700 Insomnia, unspecified: Secondary | ICD-10-CM | POA: Insufficient documentation

## 2018-07-11 LAB — CBC WITH DIFFERENTIAL/PLATELET
Abs Immature Granulocytes: 0.03 10*3/uL (ref 0.00–0.07)
Basophils Absolute: 0.1 10*3/uL (ref 0.0–0.1)
Basophils Relative: 1 %
Eosinophils Absolute: 0.3 10*3/uL (ref 0.0–0.5)
Eosinophils Relative: 4 %
HCT: 31.1 % — ABNORMAL LOW (ref 39.0–52.0)
Hemoglobin: 9.3 g/dL — ABNORMAL LOW (ref 13.0–17.0)
Immature Granulocytes: 0 %
Lymphocytes Relative: 13 %
Lymphs Abs: 0.9 10*3/uL (ref 0.7–4.0)
MCH: 27.3 pg (ref 26.0–34.0)
MCHC: 29.9 g/dL — ABNORMAL LOW (ref 30.0–36.0)
MCV: 91.2 fL (ref 80.0–100.0)
Monocytes Absolute: 0.5 10*3/uL (ref 0.1–1.0)
Monocytes Relative: 7 %
Neutro Abs: 5.3 10*3/uL (ref 1.7–7.7)
Neutrophils Relative %: 75 %
Platelets: 294 10*3/uL (ref 150–400)
RBC: 3.41 MIL/uL — ABNORMAL LOW (ref 4.22–5.81)
RDW: 19.3 % — ABNORMAL HIGH (ref 11.5–15.5)
WBC: 7.1 10*3/uL (ref 4.0–10.5)
nRBC: 0 % (ref 0.0–0.2)

## 2018-07-11 LAB — COMPREHENSIVE METABOLIC PANEL
ALT: 8 U/L (ref 0–44)
AST: 18 U/L (ref 15–41)
Albumin: 2.5 g/dL — ABNORMAL LOW (ref 3.5–5.0)
Alkaline Phosphatase: 62 U/L (ref 38–126)
Anion gap: 9 (ref 5–15)
BUN: 25 mg/dL — ABNORMAL HIGH (ref 8–23)
CO2: 23 mmol/L (ref 22–32)
Calcium: 8.5 mg/dL — ABNORMAL LOW (ref 8.9–10.3)
Chloride: 106 mmol/L (ref 98–111)
Creatinine, Ser: 1.27 mg/dL — ABNORMAL HIGH (ref 0.61–1.24)
GFR calc Af Amer: >60 mL/min (ref >60)
GFR calc non Af Amer: 53 mL/min — ABNORMAL LOW (ref >60)
Glucose, Bld: 151 mg/dL — ABNORMAL HIGH (ref 70–99)
Potassium: 3.6 mmol/L (ref 3.5–5.1)
Sodium: 138 mmol/L (ref 135–145)
Total Bilirubin: 0.5 mg/dL (ref 0.3–1.2)
Total Protein: 8.2 g/dL — ABNORMAL HIGH (ref 6.5–8.1)

## 2018-07-11 LAB — SAMPLE TO BLOOD BANK

## 2018-07-11 MED ORDER — HEPARIN SOD (PORK) LOCK FLUSH 100 UNIT/ML IV SOLN
500.0000 [IU] | Freq: Once | INTRAVENOUS | Status: AC | PRN
  Administered 2018-07-11: 500 [IU]
  Filled 2018-07-11: qty 5

## 2018-07-11 MED ORDER — SODIUM CHLORIDE 0.9 % IV SOLN
1200.0000 mg | Freq: Once | INTRAVENOUS | Status: AC
  Administered 2018-07-11: 12:00:00 1200 mg via INTRAVENOUS
  Filled 2018-07-11: qty 20

## 2018-07-11 MED ORDER — SODIUM CHLORIDE 0.9 % IV SOLN
Freq: Once | INTRAVENOUS | Status: AC
  Administered 2018-07-11: 10:00:00 via INTRAVENOUS
  Filled 2018-07-11: qty 250

## 2018-07-11 MED ORDER — SODIUM CHLORIDE 0.9% FLUSH
10.0000 mL | Freq: Once | INTRAVENOUS | Status: AC
  Administered 2018-07-11: 10 mL via INTRAVENOUS
  Filled 2018-07-11: qty 10

## 2018-07-11 NOTE — Progress Notes (Signed)
[facility]  [phone] [phone]   Name: Collin Gonzales Date: 07/11/2018 MRN: [id number]  DOB: 07-Sep-1935  Patient Care Team: [name], MD as PCP - General (Family Medicine) [name], MD as Referring Physician (Ophthalmology) [name], MD as Consulting Physician (Rheumatology) [name], MD as Referring Physician (Specialist) [name], MD as Consulting Physician (Urology) [name], MD as Referring Physician (Radiation Oncology)    REASON FOR CONSULTATION: Palliative Care consult requested for this 83 y.o. male with multiple medical problems including locally advanced high-grade urothelial carcinoma.  PMH also notable for O2 dependent COPD, PET positive lung mass status post SBRT, prostate cancer status post brachii therapy seed implant, history of CVA.  Patient is felt not to be a surgical candidate for his urothelial carcinoma and treatment options are felt to be somewhat limited based on frailty.  He was referred to palliative care to help address goals.   SOCIAL HISTORY:     reports that he quit smoking about 5 years ago. His smoking use included cigarettes. He has a 25.00 pack-year smoking history. He has never used smokeless tobacco. He reports that he does not drink alcohol or use drugs.   Patient is divorced.  He lives at home alone.  He has multiple children who live throughout the state.  Patient used to work in [employer].  ADVANCE DIRECTIVES:  Does not have  CODE STATUS:   PAST MEDICAL HISTORY: Past Medical History:  Diagnosis Date   Arthritis    Collagen vascular disease (HCC)    rheumatoid arthritis   COPD (chronic obstructive pulmonary disease) (HCC)    GERD (gastroesophageal reflux disease)    History of prostate cancer    HLD (hyperlipidemia)    HOH (hard of hearing)    Insomnia    Oxygen deficit    2L HS AND PRN    Primary cancer of right lung ([place]) 2017   rad tx's.   Prostate cancer ([place]) 2006   Rad seed tx's.   Stroke [name])    2000   Urothelial carcinoma of bladder ([place]) 10/2017   TURP to Prostate and chemo tx's.    PAST SURGICAL HISTORY:  Past Surgical History:  Procedure Laterality Date   APPENDECTOMY  1965   CATARACT EXTRACTION W/PHACO Right 01/05/2016   Procedure: CATARACT EXTRACTION PHACO AND INTRAOCULAR LENS PLACEMENT ([place]);  Surgeon: [name], MD;  Location: [facility];  Service: Ophthalmology;  Laterality: Right;  [place] 1.14 AP% 18.1 CDE 13.44 FLUID PACK LOT # [id number] H   CYSTOSCOPY W/ RETROGRADES Bilateral 11/01/2017   Procedure: CYSTOSCOPY WITH RETROGRADE PYELOGRAM;  Surgeon: Hollice Espy, MD;  Location: [facility];  Service: Urology;  Laterality: Bilateral;   ENDOBRONCHIAL ULTRASOUND N/A 02/26/2015   Procedure: ENDOBRONCHIAL ULTRASOUND;  Surgeon: Flora Lipps, MD;  Location: [facility];  Service: Cardiopulmonary;  Laterality: N/A;   [place] RADIATION SEED  2002   PORTA CATH INSERTION N/A 05/14/2018   Procedure: PORTA CATH INSERTION;  Surgeon: Algernon Huxley, MD;  Location: [facility];  Service: Cardiovascular;  Laterality: N/A;   TRANSURETHRAL RESECTION OF PROSTATE N/A 11/01/2017   Procedure: TRANSURETHRAL RESECTION OF THE PROSTATE (TURP) (channel TURP);  Surgeon: Hollice Espy, MD;  Location: [facility];  Service: Urology;  Laterality: N/A;    HEMATOLOGY/ONCOLOGY HISTORY:    Urothelial carcinoma of bladder ([place])   11/12/2017 Initial Diagnosis    Urothelial carcinoma of bladder (  [place])    11/23/2017 -  Chemotherapy    The patient had atezolizumab (TECENTRIQ) 1,200 mg in sodium chloride 0.9 % 250 mL chemo infusion, 1,200 mg, Intravenous, Once, 10 of 12 cycles Administration: 1,200 mg (11/23/2017), 1,200 mg (12/14/2017), 1,200 mg (01/04/2018), 1,200 mg (01/25/2018), 1,200 mg (03/22/2018), 1,200 mg (02/28/2018), 1,200 mg (04/12/2018), 1,200  mg (05/30/2018), 1,200 mg (06/20/2018)  for chemotherapy treatment.      ALLERGIES:  is allergic to plaquenil [hydroxychloroquine] and simvastatin.  MEDICATIONS:  Current Outpatient Medications  Medication Sig Dispense Refill   finasteride (PROSCAR) 5 MG tablet Take 5 mg by mouth daily.  11   Fluticasone-Umeclidin-Vilant (TRELEGY ELLIPTA) 100-62.5-25 MCG/INH AEPB Inhale 1 puff into the lungs daily. 60 each 5   folic acid (FOLVITE) 1 MG tablet Take 1 tablet (1 mg total) by mouth daily. 30 tablet 5   HYDROcodone-acetaminophen (NORCO) 5-325 MG tablet Take 1 tablet by mouth every 6 (six) hours as needed for moderate pain. 30 tablet 0   megestrol (MEGACE) 20 MG tablet Take 1 tablet (20 mg total) by mouth 2 (two) times daily. 180 tablet 1   methotrexate (RHEUMATREX) 2.5 MG tablet Take 5 tablets by mouth once a week.     OXYGEN Place 2 L into the nose daily as needed.      prochlorperazine (COMPAZINE) 10 MG tablet Take 1 tablet (10 mg total) by mouth every 6 (six) hours as needed (Nausea or vomiting). 60 tablet 2   QUEtiapine (SEROQUEL) 25 MG tablet Take 1 tablet by mouth 2 (two) times a day.     tamsulosin (FLOMAX) 0.4 MG CAPS capsule Take 1 capsule (0.4 mg total) by mouth daily. (Patient taking differently: Take 0.8 mg by mouth daily. ) 30 capsule 11   traZODone (DESYREL) 100 MG tablet Take 1 tablet by mouth at bedtime.     No current facility-administered medications for this visit.    Facility-Administered Medications Ordered in Other Visits  Medication Dose Route Frequency Provider Last Rate Last Dose   atezolizumab (TECENTRIQ) 1,200 mg in sodium chloride 0.9 % 250 mL chemo infusion  1,200 mg Intravenous Once Lloyd Huger, MD 540 mL/hr at 07/11/18 1137 1,200 mg at 07/11/18 1137   heparin lock flush 100 unit/mL  500 Units Intracatheter Once PRN Lloyd Huger, MD        VITAL SIGNS: There were no vitals taken for this visit. There were no vitals filed for this  visit.  Estimated body mass index is 19.05 kg/m as calculated from the following:   Height as of an earlier encounter on 07/11/18: 5\' 6"  (1.676 m).   Weight as of an earlier encounter on 07/11/18: 118 lb (53.5 kg).  LABS: CBC:    Component Value Date/Time   WBC 7.1 07/11/2018 0914   HGB 9.3 (L) 07/11/2018 0914   HGB 12.3 (L) 11/21/2014 1010   HCT 31.1 (L) 07/11/2018 0914   HCT 39.3 11/21/2014 1010   PLT 294 07/11/2018 0914   PLT 387 (H) 11/21/2014 1010   MCV 91.2 07/11/2018 0914   MCV 95 11/21/2014 1010   MCV 95 01/05/2014 0429   NEUTROABS 5.3 07/11/2018 0914   NEUTROABS 3.2 11/21/2014 1010   NEUTROABS 5.3 01/05/2014 0429   LYMPHSABS 0.9 07/11/2018 0914   LYMPHSABS 1.7 11/21/2014 1010   LYMPHSABS 0.4 (L) 01/05/2014 0429   MONOABS 0.5 07/11/2018 0914   MONOABS 0.2 01/05/2014 0429   EOSABS 0.3 07/11/2018 0914   EOSABS 0.1 11/21/2014 1010   EOSABS 0.0  01/05/2014 0429   BASOSABS 0.1 07/11/2018 0914   BASOSABS 0.0 11/21/2014 1010   BASOSABS 0.0 01/05/2014 0429   Comprehensive Metabolic Panel:    Component Value Date/Time   NA 138 07/11/2018 0914   NA 139 12/08/2014 1004   NA 138 01/04/2014 0430   K 3.6 07/11/2018 0914   K 4.4 01/04/2014 0430   CL 106 07/11/2018 0914   CL 102 01/04/2014 0430   CO2 23 07/11/2018 0914   CO2 30 01/04/2014 0430   BUN 25 (H) 07/11/2018 0914   BUN 15 12/08/2014 1004   BUN 17 01/04/2014 0430   CREATININE 1.27 (H) 07/11/2018 0914   CREATININE 1.17 10/11/2017 1239   GLUCOSE 151 (H) 07/11/2018 0914   GLUCOSE 121 (H) 01/04/2014 0430   CALCIUM 8.5 (L) 07/11/2018 0914   CALCIUM 8.4 (L) 01/04/2014 0430   AST 18 07/11/2018 0914   ALT 8 07/11/2018 0914   ALKPHOS 62 07/11/2018 0914   BILITOT 0.5 07/11/2018 0914   BILITOT 0.3 12/08/2014 1004   PROT 8.2 (H) 07/11/2018 0914   PROT 8.6 (H) 12/08/2014 1004   ALBUMIN 2.5 (L) 07/11/2018 0914   ALBUMIN 3.7 12/08/2014 1004    RADIOGRAPHIC STUDIES: No results found.  PERFORMANCE STATUS (ECOG) :  1  Review of Systems As noted above. Otherwise, a complete review of systems is negative.  Physical Exam General: NAD, frail appearing, thin Pulmonary: unlabored Extremities: no edema Skin: no rashes Neurological: Weakness but otherwise nonfocal  IMPRESSION: Routine follow-up visit today with patient to discuss pain management.  Patient was seen while he was receiving treatment in the infusion area.   Patient denies any acute changes or concerns today.  He says his pain has been stable and he has no other distressing symptoms.  He continues to receive treatment with Tecentriq. Hospice has been previously discussed but patient opted to continue palliative treatment. Patient is anxious for referral back to urology to re-evaluate his disease.   Patient reports that his appetite has improved.  I note weight is up to 118 pounds this month from 107 pounds last month.  I have previously reviewed with patient ACP documents and a MOST form.  I readdressed this today with patient and he says that he has a form at home and has looked at it but not made any decisions.   PLAN: -Continue current scope of treatment -Continue Norco 5-325mg  Q6H prn for pain -ACP/MOST form when he returns to the clinic -RTC in 2-3 weeks   Patient expressed understanding and was in agreement with this plan. He also understands that He can call clinic at any time with any questions, concerns, or complaints.     Time Total: 10 minutes  Visit consisted of counseling and education dealing with the complex and emotionally intense issues of symptom management and palliative care in the setting of serious and potentially life-threatening illness.Greater than 50%  of this time was spent counseling and coordinating care related to the above assessment and plan.  Signed by: Altha Harm, PhD, NP-C [phone] (Work Cell)

## 2018-07-11 NOTE — Progress Notes (Signed)
Patient stated that his appetite is getting better.

## 2018-07-12 LAB — THYROID PANEL WITH TSH
Free Thyroxine Index: 2 (ref 1.2–4.9)
T3 Uptake Ratio: 28 % (ref 24–39)
T4, Total: 7 ug/dL (ref 4.5–12.0)
TSH: 1.97 u[IU]/mL (ref 0.450–4.500)

## 2018-07-20 ENCOUNTER — Ambulatory Visit (INDEPENDENT_AMBULATORY_CARE_PROVIDER_SITE_OTHER): Payer: Medicare Other | Admitting: Family Medicine

## 2018-07-20 ENCOUNTER — Encounter: Payer: Self-pay | Admitting: Family Medicine

## 2018-07-20 ENCOUNTER — Other Ambulatory Visit: Payer: Self-pay

## 2018-07-20 DIAGNOSIS — Z9981 Dependence on supplemental oxygen: Secondary | ICD-10-CM

## 2018-07-20 DIAGNOSIS — I700 Atherosclerosis of aorta: Secondary | ICD-10-CM

## 2018-07-20 DIAGNOSIS — D696 Thrombocytopenia, unspecified: Secondary | ICD-10-CM

## 2018-07-20 DIAGNOSIS — C61 Malignant neoplasm of prostate: Secondary | ICD-10-CM

## 2018-07-20 DIAGNOSIS — I69354 Hemiplegia and hemiparesis following cerebral infarction affecting left non-dominant side: Secondary | ICD-10-CM

## 2018-07-20 DIAGNOSIS — Z515 Encounter for palliative care: Secondary | ICD-10-CM

## 2018-07-20 DIAGNOSIS — C689 Malignant neoplasm of urinary organ, unspecified: Secondary | ICD-10-CM

## 2018-07-20 DIAGNOSIS — IMO0002 Reserved for concepts with insufficient information to code with codable children: Secondary | ICD-10-CM

## 2018-07-20 DIAGNOSIS — G4709 Other insomnia: Secondary | ICD-10-CM

## 2018-07-20 DIAGNOSIS — I4892 Unspecified atrial flutter: Secondary | ICD-10-CM

## 2018-07-20 DIAGNOSIS — J449 Chronic obstructive pulmonary disease, unspecified: Secondary | ICD-10-CM

## 2018-07-20 DIAGNOSIS — I4891 Unspecified atrial fibrillation: Secondary | ICD-10-CM | POA: Diagnosis not present

## 2018-07-20 MED ORDER — QUETIAPINE FUMARATE 50 MG PO TABS: 50.0000 mg | ORAL_TABLET | Freq: Every day | ORAL | 0 refills | Status: DC

## 2018-07-20 MED ORDER — FLUTICASONE-UMECLIDIN-VILANT 100-62.5-25 MCG/INH IN AEPB: 1.0000 | INHALATION_SPRAY | Freq: Every day | RESPIRATORY_TRACT | 5 refills | Status: DC

## 2018-07-20 NOTE — Progress Notes (Signed)
Name: Collin Gonzales   MRN: [id number]    DOB: 01-02-1936   Date:07/20/2018       Progress Note  Subjective  Chief Complaint  Chief Complaint  Patient presents with   Medication Refill    I connected with  Collin Gonzales  on 07/20/18 at  9:40 AM EDT by a video enabled telemedicine application, however connection was poor and switched to phone converasation and verified that I am speaking with the correct person using two identifiers.  I discussed the limitations of evaluation and management by telemedicine and the availability of in person appointments. The patient expressed understanding and agreed to proceed. Staff also discussed with the patient that there may be a patient responsible charge related to this service. Patient Location: at home  Provider Location: [facility]   HPI  Chronic insomnia:he failed Trazodone, Remeron, and also Ambien 5 mg, he states seroquel helped him the best but is currently taking 50 mg at night,  however still takes a long time to fall asleep. He states going to bed around 11 pm and takes hours to fall asleep, he wakes up around 8 am. He does not watch TV in his room, he states mind is busy at night,    COPD/emphysema: history of CAP back 02/2017, he is doing better now, back to baseline cough and SOB, he is oxygen dependent 2 liters. Hewas treated for right lung cancer, treated by Dr. Baruch Gouty with radiationand is only being monitored now. He is using Trelegy. He is under palliative care, no longer seeing pulmonologist   Atherosclerosis of aorta: not on statin, he is 83 years old and multiple co morbidities   AFib: palliative care, also has severe anemia - last Hgb was 8.5  Thrombocytosis: on last labs, monitored by Dr. [name]   High grade urothelial carcinoma: recently seen by Dr. [name] and not a candidate for surgery or additional XRT secondary to history of prostate cancer and brachii therapy seed placement, he is  under palliative treatment with Tecentriq   Left side weakness history of CVA: stable, still able to walk on his own   Patient Active Problem List   Diagnosis Date Noted   Aortic atherosclerosis ([place]) 07/20/2018   Thrombocytopenia, unspecified ([place]) 07/20/2018   Urothelial carcinoma of bladder ([place]) 11/12/2017   Urinary retention 11/01/2017   Dependence on supplemental oxygen 04/24/2017   Malnutrition ([place]) 04/24/2017   Hemiparesis affecting left side as late effect of cerebrovascular accident (CVA) ([place]) 04/24/2017   Cataract 09/06/2016   Drug-induced folate deficiency anemia 09/06/2016   Esophagitis, reflux 09/06/2016   Iron deficiency anemia due to chronic blood loss 08/23/2016   Decrease in appetite 09/01/2015   GERD (gastroesophageal reflux disease) 08/05/2015   COPD (chronic obstructive pulmonary disease) ([place]) 08/20/2014   Dyslipidemia 08/20/2014   Chronic insomnia 08/20/2014   Arthritis or polyarthritis, rheumatoid ([place]) 08/20/2014   History of prostate cancer 11/01/2013   Flutter-fibrillation ([place]) 03/18/2005    Past Surgical History:  Procedure Laterality Date   APPENDECTOMY  1965   CATARACT EXTRACTION W/PHACO Right 01/05/2016   Procedure: CATARACT EXTRACTION PHACO AND INTRAOCULAR LENS PLACEMENT ([place]);  Surgeon: [name], MD;  Location: [facility];  Service: Ophthalmology;  Laterality: Right;  [place] 1.14 AP% 18.1 CDE 13.44 FLUID PACK LOT # [id number] H   CYSTOSCOPY W/ RETROGRADES Bilateral 11/01/2017   Procedure: CYSTOSCOPY WITH RETROGRADE PYELOGRAM;  Surgeon: Hollice Espy, MD;  Location: [facility];  Service: Urology;  Laterality: Bilateral;   ENDOBRONCHIAL ULTRASOUND  N/A 02/26/2015   Procedure: ENDOBRONCHIAL ULTRASOUND;  Surgeon: Flora Lipps, MD;  Location: [facility];  Service: Cardiopulmonary;  Laterality: N/A;   [place] RADIATION SEED  2002   PORTA CATH INSERTION N/A 05/14/2018   Procedure: PORTA CATH  INSERTION;  Surgeon: Algernon Huxley, MD;  Location: [place] [facility];  Service: Cardiovascular;  Laterality: N/A;   TRANSURETHRAL RESECTION OF PROSTATE N/A 11/01/2017   Procedure: TRANSURETHRAL RESECTION OF THE PROSTATE (TURP) (channel TURP);  Surgeon: Hollice Espy, MD;  Location: [facility];  Service: Urology;  Laterality: N/A;    Family History  Problem Relation Age of Onset   Cancer Mother    Cancer Father        Lung Cancer   Cancer Sister        breast   Cancer Brother        stomach    Social History   Socioeconomic History   Marital status: Divorced    Spouse name: Not on file   Number of children: 5   Years of education: Not on file   Highest education level: 12th grade  Occupational History   Occupation: Retired  Scientist, product/process development strain: Not hard at [employer] insecurity:    Worry: Never true    Inability: Never true   Transportation needs:    Medical: No    Non-medical: No  Tobacco Use   Smoking status: Former Smoker    Packs/day: 0.50    Years: 50.00    Pack years: 25.00    Types: Cigarettes    Last attempt to quit: 02/21/2013    Years since quitting: 5.4   Smokeless tobacco: Never Used   Tobacco comment: smoking cessation materials not required  Substance and Sexual Activity   Alcohol use: No    Alcohol/week: 0.0 standard drinks   Drug use: No   Sexual activity: Never  Lifestyle   Physical activity:    Days per week: 0 days    Minutes per session: 0 min   Stress: Not at all  Relationships   Social connections:    Talks on phone: Patient refused    Gets together: Patient refused    Attends religious service: Patient refused    Active member of club or organization: Patient refused    Attends meetings of clubs or organizations: Patient refused    Relationship status: Divorced   Intimate partner violence:    Fear of current or ex partner: No    Emotionally abused: No    Physically abused: No    Forced  sexual activity: No  Other Topics Concern   Not on file  Social History Narrative   Not on file     Current Outpatient Medications:    Fluticasone-Umeclidin-Vilant (TRELEGY ELLIPTA) 100-62.5-25 MCG/INH AEPB, Inhale 1 puff into the lungs daily., Disp: 60 each, Rfl: 5   methotrexate (RHEUMATREX) 2.5 MG tablet, Take 5 tablets by mouth once a week., Disp: , Rfl:    OXYGEN, Place 2 L into the nose daily as needed. , Disp: , Rfl:    QUEtiapine (SEROQUEL) 50 MG tablet, Take 1 tablet (50 mg total) by mouth at bedtime., Disp: 90 tablet, Rfl: 0   folic acid (FOLVITE) 1 MG tablet, Take 1 tablet (1 mg total) by mouth daily. (Patient not taking: Reported on 07/20/2018), Disp: 30 tablet, Rfl: 5   megestrol (MEGACE) 20 MG tablet, Take 1 tablet (20 mg total) by  mouth 2 (two) times daily. (Patient not taking: Reported on 07/20/2018), Disp: 180 tablet, Rfl: 1  Allergies  Allergen Reactions   Plaquenil [Hydroxychloroquine]     Bad dreams   Simvastatin Other (See Comments)    Bad dreams    I personally reviewed active problem list, medication list, allergies, family history, social history with the patient/caregiver today.   ROS  Ten systems reviewed and is negative except as mentioned in HPI   Objective  Virtual encounter, vitals not obtained.  There is no height or weight on file to calculate BMI.  Physical Exam  Labored breathing, awake, alert and oriented.  PHQ2/9: Depression screen [facility] 2/9 07/20/2018 11/24/2017 10/11/2017 08/16/2017 08/08/2017  Decreased Interest 0 0 0 0 0  Down, Depressed, Hopeless 0 0 0 0 0  PHQ - 2 Score 0 0 0 0 0  Altered sleeping 0 3 0 - 0  Tired, decreased energy 0 3 0 - 0  Change in appetite 0 1 0 - 0  Feeling bad or failure about yourself  0 0 0 - 0  Trouble concentrating 0 0 0 - 0  Moving slowly or fidgety/restless 0 0 0 - 0  Suicidal thoughts 0 0 0 - 0  PHQ-9 Score 0 7 0 - 0  Difficult doing work/chores - Not difficult at all Not difficult at all -  Not difficult at all  Some recent data might be hidden   PHQ-2/9 Result is negative.    Fall Risk: Fall Risk  07/20/2018 03/30/2018 11/24/2017 10/11/2017 08/16/2017  Falls in the past year? 0 0 No No No  Number falls in past yr: 0 - - - -  Injury with Fall? 0 - - - -  Risk for fall due to : - - - - -  Risk for fall due to: Comment - - - - -     Assessment & Plan  1. Chronic obstructive pulmonary disease, unspecified COPD type ([place])  - Fluticasone-Umeclidin-Vilant (TRELEGY ELLIPTA) 100-62.5-25 MCG/INH AEPB; Inhale 1 puff into the lungs daily.  Dispense: 60 each; Refill: 5  2. Hemiparesis affecting left side as late effect of cerebrovascular accident (CVA) ([place])  stable  3. Aortic atherosclerosis (HCC)  Not on statin, palliative care patient   4. Atrial fibrillation/flutter ([place])  Denies symptoms, not on blood thinners secondary to his health and high risk of complications  5. Thrombocytopenia, unspecified ([place])  Under the care of Dr. [name]   6. Prostate cancer ([place])  Stopped flomax and proscar on his own  7. Other insomnia  - QUEtiapine (SEROQUEL) 50 MG tablet; Take 1 tablet (50 mg total) by mouth at bedtime.  Dispense: 90 tablet; Refill: 0  8. Dependence on supplemental oxygen  2 liters  9. Palliative care patient   10. Urothelial carcinoma (HCC)  Under the care of Dr. [name], not interested in hospice  I discussed the assessment and treatment plan with the patient. The patient was provided an opportunity to ask questions and all were answered. The patient agreed with the plan and demonstrated an understanding of the instructions.  The patient was advised to call back or seek an in-person evaluation if the symptoms worsen or if the condition fails to improve as anticipated.  I provided 25 minutes of non-face-to-face time during this encounter.

## 2018-07-25 NOTE — Progress Notes (Signed)
07/26/2018 4:11 PM   Collin Gonzales 02/09/1936 [id number]  Referring provider: [name], MD [address] [place], [place] [address]  Chief Complaint  Patient presents with  . 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  He continues to void spontaneously.  His PVR is 21 mL.  He is complaining of urge incontinence, intermittency, straining to urinate and a weak urinary stream.  Patient denies any gross hematuria, dysuria or suprapubic/flank pain.  Patient denies any fevers, chills, nausea or vomiting.    PMH: Past Medical History:  Diagnosis Date  . Arthritis   . Collagen vascular disease (HCC)    rheumatoid arthritis  . COPD (chronic obstructive pulmonary disease) ([place])   . GERD (gastroesophageal reflux disease)   . History of prostate cancer   . HLD (hyperlipidemia)   . HOH (hard of hearing)   . Insomnia   . Oxygen deficit    2L HS AND PRN  . Primary cancer of right lung ([place]) 2017   rad tx's.  . Prostate cancer ([place]) 2006   Rad seed tx's.  . Stroke ([place])    2000  . Urothelial carcinoma of bladder ([place]) 10/2017   TURP to Prostate and chemo tx's.    Surgical History: Past Surgical History:  Procedure Laterality Date  . APPENDECTOMY  1965  . CATARACT EXTRACTION W/PHACO Right 01/05/2016   Procedure: CATARACT EXTRACTION PHACO AND INTRAOCULAR LENS PLACEMENT (I[facility]);  Surgeon: [name], MD;  Location: [facility];  Service: Ophthalmology;  Laterality: Right;  [place] 1.14 AP% 18.1 CDE 13.44 FLUID PACK LOT # [id number] H  . CYSTOSCOPY W/ RETROGRADES Bilateral 11/01/2017   Procedure: CYSTOSCOPY WITH RETROGRADE PYELOGRAM;  Surgeon: Hollice Espy, MD;  Location: [facility];  Service: Urology;  Laterality: Bilateral;  . ENDOBRONCHIAL ULTRASOUND N/A 02/26/2015   Procedure: ENDOBRONCHIAL ULTRASOUND;  Surgeon: Flora Lipps, MD;  Location: [facility];  Service:  Cardiopulmonary;  Laterality: N/A;  . [place]  . INSERTION PROSTATE RADIATION SEED  2002  . PORTA CATH INSERTION N/A 05/14/2018   Procedure: PORTA CATH INSERTION;  Surgeon: Algernon Huxley, MD;  Location: [place] [facility];  Service: Cardiovascular;  Laterality: N/A;  . TRANSURETHRAL RESECTION OF PROSTATE N/A 11/01/2017   Procedure: TRANSURETHRAL RESECTION OF THE PROSTATE (TURP) (channel TURP);  Surgeon: Hollice Espy, MD;  Location: [facility];  Service: Urology;  Laterality: N/A;    Home Medications:  Allergies as of 07/26/2018      Reactions   Plaquenil [hydroxychloroquine]    Bad dreams   Simvastatin Other (See Comments)   Bad dreams      Medication List       Accurate as of July 26, 2018 11:59 PM. If you have any questions, ask your nurse or doctor.        Fluticasone-Umeclidin-Vilant 100-62.5-25 MCG/INH Aepb Commonly known as:  Trelegy Ellipta Inhale 1 puff into the lungs daily.   folic acid 1 MG tablet Commonly known as:  FOLVITE Take 1 tablet (1 mg total) by mouth daily.   megestrol 20 MG tablet Commonly known as:  MEGACE Take 1 tablet (20 mg total) by mouth 2 (two) times daily.   methotrexate 2.5 MG tablet Commonly known as:  RHEUMATREX Take 5 tablets by mouth once a week.   OXYGEN Place 2 L into the nose daily as needed.   QUEtiapine 50 MG tablet Commonly known as:  SEROQUEL Take 1  tablet (50 mg total) by mouth at bedtime.       Allergies:  Allergies  Allergen Reactions  . Plaquenil [Hydroxychloroquine]     Bad dreams  . Simvastatin Other (See Comments)    Bad dreams    Family History: Family History  Problem Relation Age of Onset  . Cancer Mother   . Cancer Father        Lung Cancer  . Cancer Sister        breast  . Cancer Brother        stomach    Social History:  reports that he quit smoking about 5 years ago. His smoking use included cigarettes. He has a 25.00 pack-year smoking history. He has never used smokeless tobacco.  He reports that he does not drink alcohol or use drugs.  ROS: UROLOGY Frequent Urination?: No Hard to postpone urination?: No Burning/pain with urination?: No Get up at night to urinate?: No Leakage of urine?: Yes Urine stream starts and stops?: Yes Trouble starting stream?: Yes Do you have to strain to urinate?: Yes Blood in urine?: No Urinary tract infection?: No Sexually transmitted disease?: No Injury to kidneys or bladder?: No Painful intercourse?: No Weak stream?: Yes Erection problems?: Yes Penile pain?: Yes  Gastrointestinal Nausea?: No Vomiting?: No Indigestion/heartburn?: No Diarrhea?: No Constipation?: No  Constitutional Fever: No Night sweats?: No Weight loss?: No Fatigue?: No  Skin Skin rash/lesions?: No Itching?: No  Eyes Blurred vision?: No Double vision?: No  Ears/Nose/Throat Sore throat?: No Sinus problems?: No  Hematologic/Lymphatic Swollen glands?: No Easy bruising?: No  Cardiovascular Leg swelling?: No Chest pain?: No  Respiratory Cough?: Yes Shortness of breath?: Yes  Endocrine Excessive thirst?: No  Musculoskeletal Back pain?: No Joint pain?: No  Neurological Headaches?: No Dizziness?: No  Psychologic Depression?: No Anxiety?: No  Physical Exam: BP (!) 97/59 (BP Location: Left Arm, Patient Position: Sitting, Cuff Size: Normal)   Pulse 93   Ht 5\' 6"  (1.676 m)   Wt 118 lb (53.5 kg)   BMI 19.05 kg/m   Constitutional:  Well nourished. Alert and oriented, No acute distress. HEENT: [place] AT, moist mucus membranes.  Trachea midline, no masses. Cardiovascular: No clubbing, cyanosis, or edema. Respiratory: Normal respiratory effort, no increased work of breathing. Neurologic: Grossly intact, no focal deficits, moving all 4 extremities. Psychiatric: Normal mood and affect.  Pertinent Imaging: Results for orders placed or performed in visit on 07/26/18  Bladder Scan (Post Void Residual) in office  Result Value Ref Range    Scan Result 78mL      Assessment & Plan:    1. Urge incontinence Explained to Collin Gonzales that we will have to manage this conservatively with incontinence pads and consuming water and avoiding bladder irritants.  We cannot use OAB medications as he has a history of urinary retention and OAB medications may throw him back into retention.    2. Malignant neoplasm of urinary bladder neck (HCC) Locally advanced Palliative treatment managed by Dr. [name]  3. Urinary retention Resolved  BLADDER SCAN AMB NON-IMAGING  3. Rectoprostatic fistula Based on symptoms and CT scan findings, probable recto prostatic fistula Not a surgical candidate, previously discussed with general surgery We will manage conservatively   Return if symptoms worsen or fail to improve.  [name], PA-C  [facility] [address] [place], [place] [address] [phone]

## 2018-07-26 ENCOUNTER — Other Ambulatory Visit: Payer: Self-pay

## 2018-07-26 ENCOUNTER — Ambulatory Visit (INDEPENDENT_AMBULATORY_CARE_PROVIDER_SITE_OTHER): Payer: Medicare Other | Admitting: Urology

## 2018-07-26 ENCOUNTER — Encounter: Payer: Self-pay | Admitting: Urology

## 2018-07-26 VITALS — BP 97/59 | HR 93 | Ht 66.0 in | Wt 118.0 lb

## 2018-07-26 DIAGNOSIS — Z87898 Personal history of other specified conditions: Secondary | ICD-10-CM

## 2018-07-26 DIAGNOSIS — N3941 Urge incontinence: Secondary | ICD-10-CM

## 2018-07-26 DIAGNOSIS — C675 Malignant neoplasm of bladder neck: Secondary | ICD-10-CM | POA: Diagnosis not present

## 2018-07-26 DIAGNOSIS — C61 Malignant neoplasm of prostate: Secondary | ICD-10-CM

## 2018-07-26 DIAGNOSIS — N4289 Other specified disorders of prostate: Secondary | ICD-10-CM | POA: Diagnosis not present

## 2018-07-26 LAB — BLADDER SCAN AMB NON-IMAGING

## 2018-07-29 NOTE — Progress Notes (Signed)
[place]  [phone] [phone]  ID: [name]: Feb 18, 1936  MR#: [id number]  SAY#:[id number]  Patient Care Team: [name], MD as PCP - General (Family Medicine) [name], MD as Referring Physician (Ophthalmology) [name], MD as Consulting Physician (Rheumatology) [name], MD as Referring Physician (Specialist) [name], MD as Consulting Physician (Urology) [name], MD as Referring Physician (Radiation Oncology)  CHIEF COMPLAINT: Locally advanced high-grade urothelial carcinoma.    INTERVAL HISTORY: Patient returns to clinic today for further evaluation and consideration of cycle 11 of Tecentriq.  He continues to feel well and remains asymptomatic.  He denies any pain.  He has no neurologic complaints.  He denies any recent fevers or illnesses.  He has chronic shortness of breath, but denies any chest pain, hemoptysis, or cough.  He has no nausea, vomiting, constipation, or diarrhea.  He has occasional incontinence, but denies any hematuria or other urinary complaints.  Patient offers no further specific complaints today.  REVIEW OF SYSTEMS:   Review of Systems  Constitutional: Negative.  Negative for fever, malaise/fatigue and weight loss.  Respiratory: Positive for shortness of breath. Negative for cough and hemoptysis.   Cardiovascular: Negative.  Negative for chest pain and leg swelling.  Gastrointestinal: Negative.  Negative for abdominal pain, blood in stool and melena.  Genitourinary: Negative.  Negative for dysuria and hematuria.  Musculoskeletal: Negative.  Negative for back pain.  Skin: Negative.  Negative for rash.  Neurological: Negative.  Negative for sensory change, focal weakness, weakness and headaches.  Psychiatric/Behavioral: Negative.  The patient is not nervous/anxious.     As per HPI. Otherwise, a complete review of systems is negative.  PAST MEDICAL  HISTORY: Past Medical History:  Diagnosis Date  . Arthritis   . Collagen vascular disease (HCC)    rheumatoid arthritis  . COPD (chronic obstructive pulmonary disease) ([place])   . GERD (gastroesophageal reflux disease)   . History of prostate cancer   . HLD (hyperlipidemia)   . HOH (hard of hearing)   . Insomnia   . Oxygen deficit    2L HS AND PRN  . Primary cancer of right lung ([place]) 2017   rad tx's.  . Prostate cancer ([place]) 2006   Rad seed tx's.  . Stroke ([place])    2000  . Urothelial carcinoma of bladder ([place]) 10/2017   TURP to Prostate and chemo tx's.    PAST SURGICAL HISTORY: Past Surgical History:  Procedure Laterality Date  . APPENDECTOMY  1965  . CATARACT EXTRACTION W/PHACO Right 01/05/2016   Procedure: CATARACT EXTRACTION PHACO AND INTRAOCULAR LENS PLACEMENT (IOC);  Surgeon: [name], MD;  Location: [facility];  Service: Ophthalmology;  Laterality: Right;  [place] 1.14 AP% 18.1 CDE 13.44 FLUID PACK LOT # [id number] H  . CYSTOSCOPY W/ RETROGRADES Bilateral 11/01/2017   Procedure: CYSTOSCOPY WITH RETROGRADE PYELOGRAM;  Surgeon: Hollice Espy, MD;  Location: [facility];  Service: Urology;  Laterality: Bilateral;  . ENDOBRONCHIAL ULTRASOUND N/A 02/26/2015   Procedure: ENDOBRONCHIAL ULTRASOUND;  Surgeon: Flora Lipps, MD;  Location: [facility];  Service: Cardiopulmonary;  Laterality: N/A;  . [place]  . INSERTION PROSTATE RADIATION SEED  2002  . PORTA CATH INSERTION N/A 05/14/2018   Procedure: PORTA CATH INSERTION;  Surgeon: Algernon Huxley, MD;  Location: [facility];  Service: Cardiovascular;  Laterality: N/A;  . TRANSURETHRAL RESECTION OF PROSTATE N/A 11/01/2017   Procedure: TRANSURETHRAL RESECTION OF THE PROSTATE (TURP) (channel TURP);  Surgeon: Hollice Espy, MD;  Location: [facility];  Service: Urology;  Laterality: N/A;    FAMILY HISTORY: Family History  Problem Relation Age of Onset  . Cancer Mother   . Cancer Father        Lung Cancer  . Cancer Sister         breast  . Cancer Brother        stomach    ADVANCED DIRECTIVES (Y/N):  N  HEALTH MAINTENANCE: Social History   Tobacco Use  . Smoking status: Former Smoker    Packs/day: 0.50    Years: 50.00    Pack years: 25.00    Types: Cigarettes    Quit date: 02/21/2013    Years since quitting: 5.4  . Smokeless tobacco: Never Used  . Tobacco comment: smoking cessation materials not required  Substance Use Topics  . Alcohol use: No    Alcohol/week: 0.0 standard drinks  . Drug use: No     Colonoscopy:  PAP:  Bone density:  Lipid panel:  Allergies  Allergen Reactions  . Plaquenil [Hydroxychloroquine]     Bad dreams  . Simvastatin Other (See Comments)    Bad dreams    Current Outpatient Medications  Medication Sig Dispense Refill  . Fluticasone-Umeclidin-Vilant (TRELEGY ELLIPTA) 100-62.5-25 MCG/INH AEPB Inhale 1 puff into the lungs daily. 60 each 5  . folic acid (FOLVITE) 1 MG tablet Take 1 tablet (1 mg total) by mouth daily. 30 tablet 5  . megestrol (MEGACE) 20 MG tablet Take 1 tablet (20 mg total) by mouth 2 (two) times daily. 180 tablet 1  . methotrexate (RHEUMATREX) 2.5 MG tablet Take 5 tablets by mouth once a week.    . OXYGEN Place 2 L into the nose daily as needed.     Marland Kitchen QUEtiapine (SEROQUEL) 50 MG tablet Take 1 tablet (50 mg total) by mouth at bedtime. 90 tablet 0   No current facility-administered medications for this visit.     OBJECTIVE: Vitals:   08/01/18 0951  BP: 101/65  Pulse: 83  Resp: 18  Temp: (!) 96.3 F (35.7 C)     Body mass index is 17.84 kg/m.    ECOG FS:0 - Asymptomatic  General: Well-developed, well-nourished, no acute distress. Eyes: Pink conjunctiva, anicteric sclera. HEENT: Normocephalic, moist mucous membranes. Lungs: Clear to auscultation bilaterally. Heart: Regular rate and rhythm. No rubs, murmurs, or gallops. Abdomen: Soft, nontender, nondistended. No organomegaly noted, normoactive bowel sounds. Musculoskeletal: No edema,  cyanosis, or clubbing. Neuro: Alert, answering all questions appropriately. Cranial nerves grossly intact. Skin: No rashes or petechiae noted. Psych: Normal affect.  LAB RESULTS:  Lab Results  Component Value Date   NA 138 08/01/2018   K 3.6 08/01/2018   CL 105 08/01/2018   CO2 27 08/01/2018   GLUCOSE 88 08/01/2018   BUN 22 08/01/2018   CREATININE 1.11 08/01/2018   CALCIUM 8.3 (L) 08/01/2018   PROT 8.2 (H) 08/01/2018   ALBUMIN 2.3 (L) 08/01/2018   AST 15 08/01/2018   ALT 9 08/01/2018   ALKPHOS 65 08/01/2018   BILITOT 0.2 (L) 08/01/2018   GFRNONAA >60 08/01/2018   GFRAA >60 08/01/2018    Lab Results  Component Value Date   WBC 7.3 08/01/2018   NEUTROABS 5.5 08/01/2018   HGB 8.3 (L) 08/01/2018   HCT 28.3 (L) 08/01/2018   MCV 91.3 08/01/2018   PLT 208 08/01/2018     STUDIES: No results found.  ASSESSMENT: Locally advanced high-grade urothelial carcinoma.  PLAN:  1. Locally advanced high-grade urothelial carcinoma: Case discussed with urology, radiation oncology, as well as multidisciplinary tumor board.  Patient is not a surgical candidate nor can he receive additional XRT given his history of prostate cancer and brachii therapy seed placement.  Though he has a decent performance status, patient is frail and likely would not tolerate cisplatin based therapy.  CT scan results from May 25, 2018 reviewed independently with no obvious evidence of progressive disease.  Continue palliative treatment with Tecentriq.  Previously, hospice and end-of-life were discussed but patient is not interested at this time.  Proceed with cycle 11 of treatment today.  Return to clinic in 3 weeks for further evaluation and consideration of cycle 12.  Plan to refer back to urology for consideration of cystoscopy.  Next imaging will be due in July 2020.   2.  Right upper lobe lung mass: Previously, although biopsy was negative, patient had a positive PET scan that was highly suspicious for  underlying malignancy. He underwent SBRT in approximately May 2017.   3.  Shortness of breath: Chronic and unchanged.  Patient now requires oxygen 24 hours/day.   4.  Anemia: Hemoglobin has trended down to 8.3 today. 5.  Hematuria: Patient does not complain of this today.  Referral back to urology in the near future. 6.  Renal insufficiency: Patient's creatinine is now within normal limits at 1.11.  Patient expressed understanding and was in agreement with this plan. He also understands that He can call clinic at any time with any questions, concerns, or complaints.    Lloyd Huger, MD   08/02/2018 6:14 AM

## 2018-08-01 ENCOUNTER — Inpatient Hospital Stay: Payer: Medicare Other | Attending: Oncology | Admitting: Oncology

## 2018-08-01 ENCOUNTER — Encounter: Payer: Self-pay | Admitting: Oncology

## 2018-08-01 ENCOUNTER — Other Ambulatory Visit: Payer: Self-pay

## 2018-08-01 ENCOUNTER — Inpatient Hospital Stay (HOSPITAL_BASED_OUTPATIENT_CLINIC_OR_DEPARTMENT_OTHER): Payer: Medicare Other | Admitting: Hospice and Palliative Medicine

## 2018-08-01 ENCOUNTER — Inpatient Hospital Stay: Payer: Medicare Other | Admitting: *Deleted

## 2018-08-01 ENCOUNTER — Inpatient Hospital Stay: Payer: Medicare Other

## 2018-08-01 VITALS — BP 101/65 | HR 83 | Temp 96.3°F | Resp 18 | Wt 110.5 lb

## 2018-08-01 DIAGNOSIS — Z7951 Long term (current) use of inhaled steroids: Secondary | ICD-10-CM

## 2018-08-01 DIAGNOSIS — D649 Anemia, unspecified: Secondary | ICD-10-CM | POA: Diagnosis not present

## 2018-08-01 DIAGNOSIS — Z87891 Personal history of nicotine dependence: Secondary | ICD-10-CM | POA: Diagnosis not present

## 2018-08-01 DIAGNOSIS — Z8673 Personal history of transient ischemic attack (TIA), and cerebral infarction without residual deficits: Secondary | ICD-10-CM | POA: Insufficient documentation

## 2018-08-01 DIAGNOSIS — C679 Malignant neoplasm of bladder, unspecified: Secondary | ICD-10-CM

## 2018-08-01 DIAGNOSIS — Z79899 Other long term (current) drug therapy: Secondary | ICD-10-CM | POA: Insufficient documentation

## 2018-08-01 DIAGNOSIS — Z5112 Encounter for antineoplastic immunotherapy: Secondary | ICD-10-CM | POA: Insufficient documentation

## 2018-08-01 DIAGNOSIS — Z515 Encounter for palliative care: Secondary | ICD-10-CM

## 2018-08-01 DIAGNOSIS — Z8546 Personal history of malignant neoplasm of prostate: Secondary | ICD-10-CM | POA: Insufficient documentation

## 2018-08-01 DIAGNOSIS — R918 Other nonspecific abnormal finding of lung field: Secondary | ICD-10-CM

## 2018-08-01 DIAGNOSIS — R0602 Shortness of breath: Secondary | ICD-10-CM | POA: Insufficient documentation

## 2018-08-01 DIAGNOSIS — N289 Disorder of kidney and ureter, unspecified: Secondary | ICD-10-CM | POA: Diagnosis not present

## 2018-08-01 DIAGNOSIS — J449 Chronic obstructive pulmonary disease, unspecified: Secondary | ICD-10-CM | POA: Diagnosis not present

## 2018-08-01 DIAGNOSIS — G893 Neoplasm related pain (acute) (chronic): Secondary | ICD-10-CM

## 2018-08-01 DIAGNOSIS — Z95828 Presence of other vascular implants and grafts: Secondary | ICD-10-CM

## 2018-08-01 LAB — CBC WITH DIFFERENTIAL/PLATELET
Abs Immature Granulocytes: 0.03 10*3/uL (ref 0.00–0.07)
Basophils Absolute: 0 10*3/uL (ref 0.0–0.1)
Basophils Relative: 0 %
Eosinophils Absolute: 0.3 10*3/uL (ref 0.0–0.5)
Eosinophils Relative: 4 %
HCT: 28.3 % — ABNORMAL LOW (ref 39.0–52.0)
Hemoglobin: 8.3 g/dL — ABNORMAL LOW (ref 13.0–17.0)
Immature Granulocytes: 0 %
Lymphocytes Relative: 16 %
Lymphs Abs: 1.2 10*3/uL (ref 0.7–4.0)
MCH: 26.8 pg (ref 26.0–34.0)
MCHC: 29.3 g/dL — ABNORMAL LOW (ref 30.0–36.0)
MCV: 91.3 fL (ref 80.0–100.0)
Monocytes Absolute: 0.4 10*3/uL (ref 0.1–1.0)
Monocytes Relative: 5 %
Neutro Abs: 5.5 10*3/uL (ref 1.7–7.7)
Neutrophils Relative %: 75 %
Platelets: 208 10*3/uL (ref 150–400)
RBC: 3.1 MIL/uL — ABNORMAL LOW (ref 4.22–5.81)
RDW: 18.4 % — ABNORMAL HIGH (ref 11.5–15.5)
WBC: 7.3 10*3/uL (ref 4.0–10.5)
nRBC: 0 % (ref 0.0–0.2)

## 2018-08-01 LAB — COMPREHENSIVE METABOLIC PANEL
ALT: 9 U/L (ref 0–44)
AST: 15 U/L (ref 15–41)
Albumin: 2.3 g/dL — ABNORMAL LOW (ref 3.5–5.0)
Alkaline Phosphatase: 65 U/L (ref 38–126)
Anion gap: 6 (ref 5–15)
BUN: 22 mg/dL (ref 8–23)
CO2: 27 mmol/L (ref 22–32)
Calcium: 8.3 mg/dL — ABNORMAL LOW (ref 8.9–10.3)
Chloride: 105 mmol/L (ref 98–111)
Creatinine, Ser: 1.11 mg/dL (ref 0.61–1.24)
GFR calc Af Amer: >60 mL/min (ref >60)
GFR calc non Af Amer: >60 mL/min (ref >60)
Glucose, Bld: 88 mg/dL (ref 70–99)
Potassium: 3.6 mmol/L (ref 3.5–5.1)
Sodium: 138 mmol/L (ref 135–145)
Total Bilirubin: 0.2 mg/dL — ABNORMAL LOW (ref 0.3–1.2)
Total Protein: 8.2 g/dL — ABNORMAL HIGH (ref 6.5–8.1)

## 2018-08-01 MED ORDER — SODIUM CHLORIDE 0.9% FLUSH
10.0000 mL | Freq: Once | INTRAVENOUS | Status: AC
  Administered 2018-08-01: 10 mL via INTRAVENOUS
  Filled 2018-08-01: qty 10

## 2018-08-01 MED ORDER — HEPARIN SOD (PORK) LOCK FLUSH 100 UNIT/ML IV SOLN
500.0000 [IU] | Freq: Once | INTRAVENOUS | Status: AC | PRN
  Administered 2018-08-01: 500 [IU]
  Filled 2018-08-01: qty 5

## 2018-08-01 MED ORDER — SODIUM CHLORIDE 0.9 % IV SOLN
1200.0000 mg | Freq: Once | INTRAVENOUS | Status: AC
  Administered 2018-08-01: 1200 mg via INTRAVENOUS
  Filled 2018-08-01: qty 20

## 2018-08-01 MED ORDER — SODIUM CHLORIDE 0.9 % IV SOLN
Freq: Once | INTRAVENOUS | Status: AC
  Administered 2018-08-01: 11:00:00 via INTRAVENOUS
  Filled 2018-08-01: qty 250

## 2018-08-01 NOTE — Progress Notes (Signed)
Pt in for follow up denies any difficulties or concerns.  

## 2018-08-01 NOTE — Progress Notes (Signed)
[place]  [phone] [phone]) [phone]   Name: Collin Gonzales Date: 08/01/2018 MRN: [id number]  DOB: [birth year]  Patient Care Team: [name], MD as PCP - General (Family Medicine) [name], MD as Referring Physician (Ophthalmology) [name], MD as Consulting Physician (Rheumatology) [name], MD as Referring Physician (Specialist) [name], MD as Consulting Physician (Urology) [name], MD as Referring Physician (Radiation Oncology)    REASON FOR CONSULTATION: Palliative Care consult requested for this 83 y.o. male with multiple medical problems including locally advanced high-grade urothelial carcinoma.  PMH also notable for O2 dependent COPD, PET positive lung mass status post SBRT, prostate cancer status post brachii therapy seed implant, history of CVA.  Patient is felt not to be a surgical candidate for his urothelial carcinoma and treatment options are felt to be somewhat limited based on frailty.  He was referred to palliative care to help address goals.   SOCIAL HISTORY:     reports that he quit smoking about 5 years ago. His smoking use included cigarettes. He has a 25.00 pack-year smoking history. He has never used smokeless tobacco. He reports that he does not drink alcohol or use drugs.   Patient is divorced.  He lives at home alone.  He has multiple children who live throughout the state.  Patient used to work in [employer].  ADVANCE DIRECTIVES:  Does not have  CODE STATUS:   PAST MEDICAL HISTORY: Past Medical History:  Diagnosis Date  . Arthritis   . Collagen vascular disease (HCC)    rheumatoid arthritis  . COPD (chronic obstructive pulmonary disease) ([place])   . GERD (gastroesophageal reflux disease)   . History of prostate cancer   . HLD (hyperlipidemia)   . HOH (hard of hearing)   . Insomnia   . Oxygen deficit    2L HS AND PRN  .  Primary cancer of right lung ([place]) 2017   rad tx's.  . Prostate cancer ([place]) 2006   Rad seed tx's.  . Stroke ([place])    2000  . Urothelial carcinoma of bladder ([place]) 10/2017   TURP to Prostate and chemo tx's.    PAST SURGICAL HISTORY:  Past Surgical History:  Procedure Laterality Date  . APPENDECTOMY  1965  . CATARACT EXTRACTION W/PHACO Right 01/05/2016   Procedure: CATARACT EXTRACTION PHACO AND INTRAOCULAR LENS PLACEMENT (IOC);  Surgeon: [name], MD;  Location: [facility];  Service: Ophthalmology;  Laterality: Right;  [place] 1.14 AP% 18.1 CDE 13.44 FLUID PACK LOT # [id number] H  . CYSTOSCOPY W/ RETROGRADES Bilateral 11/01/2017   Procedure: CYSTOSCOPY WITH RETROGRADE PYELOGRAM;  Surgeon: Hollice Espy, MD;  Location: [facility];  Service: Urology;  Laterality: Bilateral;  . ENDOBRONCHIAL ULTRASOUND N/A 02/26/2015   Procedure: ENDOBRONCHIAL ULTRASOUND;  Surgeon: Flora Lipps, MD;  Location: [facility];  Service: Cardiopulmonary;  Laterality: N/A;  . [place]  . INSERTION PROSTATE RADIATION SEED  2002  . PORTA CATH INSERTION N/A 05/14/2018   Procedure: PORTA CATH INSERTION;  Surgeon: Algernon Huxley, MD;  Location: [facility];  Service: Cardiovascular;  Laterality: N/A;  . TRANSURETHRAL RESECTION OF PROSTATE N/A 11/01/2017   Procedure: TRANSURETHRAL RESECTION OF THE PROSTATE (TURP) (channel TURP);  Surgeon: Hollice Espy, MD;  Location: [facility];  Service: Urology;  Laterality: N/A;    HEMATOLOGY/ONCOLOGY HISTORY:    Urothelial carcinoma of bladder ([place])   11/12/2017 Initial Diagnosis    Urothelial carcinoma of bladder (  [place])    11/23/2017 -  Chemotherapy    The patient had atezolizumab (TECENTRIQ) 1,200 mg in sodium chloride 0.9 % 250 mL chemo infusion, 1,200 mg, Intravenous, Once, 11 of 12 cycles Administration: 1,200 mg (11/23/2017), 1,200 mg (12/14/2017), 1,200 mg (01/04/2018), 1,200 mg (01/25/2018), 1,200 mg (03/22/2018), 1,200 mg (02/28/2018), 1,200 mg (04/12/2018), 1,200  mg (05/30/2018), 1,200 mg (06/20/2018), 1,200 mg (07/11/2018)  for chemotherapy treatment.      ALLERGIES:  is allergic to plaquenil [hydroxychloroquine] and simvastatin.  MEDICATIONS:  Current Outpatient Medications  Medication Sig Dispense Refill  . Fluticasone-Umeclidin-Vilant (TRELEGY ELLIPTA) 100-62.5-25 MCG/INH AEPB Inhale 1 puff into the lungs daily. 60 each 5  . folic acid (FOLVITE) 1 MG tablet Take 1 tablet (1 mg total) by mouth daily. 30 tablet 5  . megestrol (MEGACE) 20 MG tablet Take 1 tablet (20 mg total) by mouth 2 (two) times daily. 180 tablet 1  . methotrexate (RHEUMATREX) 2.5 MG tablet Take 5 tablets by mouth once a week.    . OXYGEN Place 2 L into the nose daily as needed.     Marland Kitchen QUEtiapine (SEROQUEL) 50 MG tablet Take 1 tablet (50 mg total) by mouth at bedtime. 90 tablet 0   No current facility-administered medications for this visit.    Facility-Administered Medications Ordered in Other Visits  Medication Dose Route Frequency Provider Last Rate Last Dose  . atezolizumab (TECENTRIQ) 1,200 mg in sodium chloride 0.9 % 250 mL chemo infusion  1,200 mg Intravenous Once Lloyd Huger, MD 540 mL/hr at 08/01/18 1126 1,200 mg at 08/01/18 1126  . heparin lock flush 100 unit/mL  500 Units Intracatheter Once PRN Lloyd Huger, MD        VITAL SIGNS: There were no vitals taken for this visit. There were no vitals filed for this visit.  Estimated body mass index is 17.84 kg/m as calculated from the following:   Height as of 07/26/18: 5\' 6"  (1.676 m).   Weight as of an earlier encounter on 08/01/18: 110 lb 8 oz (50.1 kg).  LABS: CBC:    Component Value Date/Time   WBC 7.3 08/01/2018 0921   HGB 8.3 (L) 08/01/2018 0921   HGB 12.3 (L) 11/21/2014 1010   HCT 28.3 (L) 08/01/2018 0921   HCT 39.3 11/21/2014 1010   PLT 208 08/01/2018 0921   PLT 387 (H) 11/21/2014 1010   MCV 91.3 08/01/2018 0921   MCV 95 11/21/2014 1010   MCV 95 01/05/2014 0429   NEUTROABS 5.5 08/01/2018  0921   NEUTROABS 3.2 11/21/2014 1010   NEUTROABS 5.3 01/05/2014 0429   LYMPHSABS 1.2 08/01/2018 0921   LYMPHSABS 1.7 11/21/2014 1010   LYMPHSABS 0.4 (L) 01/05/2014 0429   MONOABS 0.4 08/01/2018 0921   MONOABS 0.2 01/05/2014 0429   EOSABS 0.3 08/01/2018 0921   EOSABS 0.1 11/21/2014 1010   EOSABS 0.0 01/05/2014 0429   BASOSABS 0.0 08/01/2018 0921   BASOSABS 0.0 11/21/2014 1010   BASOSABS 0.0 01/05/2014 0429   Comprehensive Metabolic Panel:    Component Value Date/Time   NA 138 08/01/2018 0921   NA 139 12/08/2014 1004   NA 138 01/04/2014 0430   K 3.6 08/01/2018 0921   K 4.4 01/04/2014 0430   CL 105 08/01/2018 0921   CL 102 01/04/2014 0430   CO2 27 08/01/2018 0921   CO2 30 01/04/2014 0430   BUN 22 08/01/2018 0921   BUN 15 12/08/2014 1004   BUN 17 01/04/2014 0430   CREATININE 1.11 08/01/2018 4332  CREATININE 1.17 10/11/2017 1239   GLUCOSE 88 08/01/2018 0921   GLUCOSE 121 (H) 01/04/2014 0430   CALCIUM 8.3 (L) 08/01/2018 0921   CALCIUM 8.4 (L) 01/04/2014 0430   AST 15 08/01/2018 0921   ALT 9 08/01/2018 0921   ALKPHOS 65 08/01/2018 0921   BILITOT 0.2 (L) 08/01/2018 0921   BILITOT 0.3 12/08/2014 1004   PROT 8.2 (H) 08/01/2018 0921   PROT 8.6 (H) 12/08/2014 1004   ALBUMIN 2.3 (L) 08/01/2018 0921   ALBUMIN 3.7 12/08/2014 1004    RADIOGRAPHIC STUDIES: No results found.  PERFORMANCE STATUS (ECOG) : 1  Review of Systems As noted above. Otherwise, a complete review of systems is negative.  Physical Exam General: NAD, frail appearing, thin Pulmonary: unlabored Extremities: no edema Skin: no rashes Neurological: Weakness but otherwise nonfocal  IMPRESSION: Routine follow-up visit today with patient to discuss pain management.  Patient was seen while he was receiving treatment in the infusion area.   Patient denies any changes or concerns today. He says his pain is improved and well controlled on current regimen with PRN Norco.   Symptomatically, patient says he is  doing well. He reports increased oral intake. However, I note that weight is down to 110lbs today from 118lbs last week. This weight could be spurious but will follow trends. Will refer to RD to follow.   Patient says all of his questions were answered by urology. He continues to receive active treatment.   PLAN: -Continue current scope of treatment -Continue Norco 5-325mg  Q6H prn for pain -ACP/MOST form when he returns to the clinic (not infusion area) -Referral to RD -RTC in 2-3 weeks   Patient expressed understanding and was in agreement with this plan. He also understands that He can call clinic at any time with any questions, concerns, or complaints.     Time Total: 10 minutes  Visit consisted of counseling and education dealing with the complex and emotionally intense issues of symptom management and palliative care in the setting of serious and potentially life-threatening illness.Greater than 50%  of this time was spent counseling and coordinating care related to the above assessment and plan.  Signed by: Altha Harm, PhD, NP-C [phone] (Work Cell)

## 2018-08-02 LAB — THYROID PANEL WITH TSH
Free Thyroxine Index: 2.3 (ref 1.2–4.9)
T3 Uptake Ratio: 35 % (ref 24–39)
T4, Total: 6.6 ug/dL (ref 4.5–12.0)
TSH: 1.43 u[IU]/mL (ref 0.450–4.500)

## 2018-08-07 DIAGNOSIS — J449 Chronic obstructive pulmonary disease, unspecified: Secondary | ICD-10-CM | POA: Diagnosis not present

## 2018-08-08 ENCOUNTER — Ambulatory Visit: Payer: Medicare HMO

## 2018-08-14 ENCOUNTER — Other Ambulatory Visit: Payer: Self-pay

## 2018-08-14 ENCOUNTER — Ambulatory Visit (INDEPENDENT_AMBULATORY_CARE_PROVIDER_SITE_OTHER): Payer: Medicare Other

## 2018-08-14 VITALS — BP 102/58 | HR 83 | Temp 98.3°F | Resp 15 | Ht 66.0 in | Wt 109.8 lb

## 2018-08-14 DIAGNOSIS — Z0000 Encounter for general adult medical examination without abnormal findings: Secondary | ICD-10-CM | POA: Diagnosis not present

## 2018-08-14 NOTE — Progress Notes (Signed)
Subjective:   Collin Gonzales is a 83 y.o. male who presents for Medicare Annual/Subsequent preventive examination.  Review of Systems:   Cardiac Risk Factors include: advanced age (>65men, >53 women);male gender     Objective:    Vitals: BP (!) 102/58 (BP Location: Left Arm, Patient Position: Sitting, Cuff Size: Normal)   Pulse 83   Temp 98.3 F (36.8 C) (Oral)   Resp 15   Ht 5\' 6"  (1.676 m)   Wt 109 lb 12.8 oz (49.8 kg)   SpO2 91%   BMI 17.72 kg/m   Body mass index is 17.72 kg/m.  Advanced Directives 08/14/2018 08/01/2018 07/11/2018 06/20/2018 05/30/2018 05/14/2018 05/09/2018  Does Patient Have a Medical Advance Directive? No No No No No - No  Would patient like information on creating a medical advance directive? Yes (MAU/Ambulatory/Procedural Areas - Information given) - No - Patient declined No - Patient declined No - Patient declined No - Patient declined No - Patient declined    Tobacco Social History   Tobacco Use  Smoking Status Former Smoker  . Packs/day: 0.50  . Years: 50.00  . Pack years: 25.00  . Types: Cigarettes  . Quit date: 02/21/2013  . Years since quitting: 5.4  Smokeless Tobacco Never Used  Tobacco Comment   smoking cessation materials not required     Counseling given: Not Answered Comment: smoking cessation materials not required   Clinical Intake:  Pre-visit preparation completed: Yes  Pain : No/denies pain     BMI - recorded: 17.72 Nutritional Status: BMI <19  Underweight Nutritional Risks: None Diabetes: No  How often do you need to have someone help you when you read instructions, pamphlets, or other written materials from your doctor or pharmacy?: 1 - Never  Interpreter Needed?: No  Information entered by :: Clemetine Marker LPN  Past Medical History:  Diagnosis Date  . Arthritis   . Collagen vascular disease (HCC)    rheumatoid arthritis  . COPD (chronic obstructive pulmonary disease) ([place])   . GERD (gastroesophageal reflux  disease)   . History of prostate cancer   . HLD (hyperlipidemia)   . HOH (hard of hearing)   . Insomnia   . Oxygen deficit    2L HS AND PRN  . Primary cancer of right lung ([place]) 2017   rad tx's.  . Prostate cancer ([place]) 2006   Rad seed tx's.  . Stroke ([place])    2000  . Urothelial carcinoma of bladder ([place]) 10/2017   TURP to Prostate and chemo tx's.   Past Surgical History:  Procedure Laterality Date  . APPENDECTOMY  1965  . CATARACT EXTRACTION W/PHACO Right 01/05/2016   Procedure: CATARACT EXTRACTION PHACO AND INTRAOCULAR LENS PLACEMENT (IOC);  Surgeon: [name], MD;  Location: [facility];  Service: Ophthalmology;  Laterality: Right;  [place] 1.14 AP% 18.1 CDE 13.44 FLUID PACK LOT # [id number]  . CYSTOSCOPY W/ RETROGRADES Bilateral 11/01/2017   Procedure: CYSTOSCOPY WITH RETROGRADE PYELOGRAM;  Surgeon: Hollice Espy, MD;  Location: [facility];  Service: Urology;  Laterality: Bilateral;  . ENDOBRONCHIAL ULTRASOUND N/A 02/26/2015   Procedure: ENDOBRONCHIAL ULTRASOUND;  Surgeon: Flora Lipps, MD;  Location: [facility];  Service: Cardiopulmonary;  Laterality: N/A;  . [place]  . INSERTION PROSTATE RADIATION SEED  2002  . PORTA CATH INSERTION N/A 05/14/2018   Procedure: PORTA CATH INSERTION;  Surgeon: Algernon Huxley, MD;  Location: [facility];  Service: Cardiovascular;  Laterality: N/A;  . TRANSURETHRAL RESECTION OF PROSTATE  N/A 11/01/2017   Procedure: TRANSURETHRAL RESECTION OF THE PROSTATE (TURP) (channel TURP);  Surgeon: Hollice Espy, MD;  Location: [facility];  Service: Urology;  Laterality: N/A;   Family History  Problem Relation Age of Onset  . Cancer Mother   . Cancer Father        Lung Cancer  . Cancer Sister        breast  . Cancer Brother        stomach   Social History   Socioeconomic History  . Marital status: Divorced    Spouse name: Not on file  . Number of children: 5  . Years of education: Not on file  . Highest education level: 12th grade   Occupational History  . Occupation: Retired  Scientific laboratory technician  . Financial resource strain: Not hard at all  . Food insecurity    Worry: Never true    Inability: Never true  . Transportation needs    Medical: No    Non-medical: No  Tobacco Use  . Smoking status: Former Smoker    Packs/day: 0.50    Years: 50.00    Pack years: 25.00    Types: Cigarettes    Quit date: 02/21/2013    Years since quitting: 5.4  . Smokeless tobacco: Never Used  . Tobacco comment: smoking cessation materials not required  Substance and Sexual Activity  . Alcohol use: No    Alcohol/week: 0.0 standard drinks  . Drug use: No  . Sexual activity: Not Currently  Lifestyle  . Physical activity    Days per week: 0 days    Minutes per session: 0 min  . Stress: Not at all  Relationships  . Social connections    Talks on phone: More than three times a week    Gets together: More than three times a week    Attends religious service: More than 4 times per year    Active member of club or organization: No    Attends meetings of clubs or organizations: Never    Relationship status: Divorced  Other Topics Concern  . Not on file  Social History Narrative  . Not on file    Outpatient Encounter Medications as of 08/14/2018  Medication Sig  . Fluticasone-Umeclidin-Vilant (TRELEGY ELLIPTA) 100-62.5-25 MCG/INH AEPB Inhale 1 puff into the lungs daily.  . megestrol (MEGACE) 20 MG tablet Take 1 tablet (20 mg total) by mouth 2 (two) times daily.  . methotrexate (RHEUMATREX) 2.5 MG tablet Take 5 tablets by mouth once a week.  . OXYGEN Place 2 L into the nose daily as needed.   Marland Kitchen QUEtiapine (SEROQUEL) 50 MG tablet Take 1 tablet (50 mg total) by mouth at bedtime.  . folic acid (FOLVITE) 1 MG tablet Take 1 tablet (1 mg total) by mouth daily. (Patient not taking: Reported on 08/14/2018)   No facility-administered encounter medications on file as of 08/14/2018.     Activities of Daily Living In your present state of health,  do you have any difficulty performing the following activities: 08/14/2018 07/20/2018  Hearing? N N  Comment declines hearing aids -  Vision? N N  Comment wears glasses -  Difficulty concentrating or making decisions? N N  Walking or climbing stairs? Y N  Comment - -  Dressing or bathing? N N  Doing errands, shopping? N N  Preparing Food and eating ? N -  Using the Toilet? N -  In the past six months, have you accidently leaked urine? Y -  Comment bladder cancer, wears depends -  Do you have problems with loss of bowel control? N -  Managing your Medications? N -  Managing your Finances? N -  Housekeeping or managing your Housekeeping? N -  Some recent data might be hidden    Patient Care Team: [name], MD as PCP - General (Family Medicine) [name], MD as Referring Physician (Ophthalmology) [name], MD as Consulting Physician (Rheumatology) [name], MD as Referring Physician (Specialist) [name], MD as Consulting Physician (Urology) [name], MD as Referring Physician (Radiation Oncology)   Assessment:   This is a routine wellness examination for Collin Gonzales.  Exercise Activities and Dietary recommendations Current Exercise Habits: The patient does not participate in regular exercise at present, Exercise limited by: respiratory conditions(s)  Goals    . DIET - INCREASE PROTEIN INTAKE     Recommend to drink 1-2 protein shakes per day    . Increase water intake     Drink at least 6-8 8oz glasses of water per day       Fall Risk Fall Risk  08/14/2018 07/20/2018 03/30/2018 11/24/2017 10/11/2017  Falls in the past year? 0 0 0 No No  Number falls in past yr: 0 0 - - -  Injury with Fall? 0 0 - - -  Risk for fall due to : - - - - -  Risk for fall due to: Comment - - - - -   FALL RISK PREVENTION PERTAINING TO THE HOME:  Any stairs in or around the home? No  If so, do they handrails? No   Home free of loose throw rugs in  walkways, pet beds, electrical cords, etc? Yes  Adequate lighting in your home to reduce risk of falls? Yes   ASSISTIVE DEVICES UTILIZED TO PREVENT FALLS:  Life alert? Yes  does not wear it at home Use of a cane, walker or w/c? No  Grab bars in the bathroom? Yes  Shower chair or bench in shower? No  Elevated toilet seat or a handicapped toilet? No   DME ORDERS:  DME order needed?  No   TIMED UP AND GO:  Was the test performed? Yes .  Length of time to ambulate 10 feet: 6 sec.   GAIT:  Appearance of gait: Gait stead-fast and without the use of an assistive device.   Education: Fall risk prevention has been discussed.  Intervention(s) required? No   Depression Screen PHQ 2/9 Scores 08/14/2018 07/20/2018 11/24/2017 10/11/2017  PHQ - 2 Score 0 0 0 0  PHQ- 9 Score - 0 7 0    Cognitive Function PT declined 6CIT for 2020     6CIT Screen 08/08/2017 01/03/2017 01/11/2016  What Year? 0 points 0 points 0 points  What month? 0 points - 0 points  What time? 0 points 0 points 0 points  Count back from 20 0 points 0 points 0 points  Months in reverse 0 points 0 points 0 points  Repeat phrase 0 points 0 points 4 points  Total Score 0 - 4    Immunization History  Administered Date(s) Administered  . Influenza, High Dose Seasonal PF 11/21/2014, 01/11/2016, 01/03/2017, 11/24/2017  . Influenza, Seasonal, Injecte, Preservative Fre 11/08/2010  . Influenza,inj,Quad PF,6+ Mos 12/06/2012  . Pneumococcal Conjugate-13 01/03/2017  . Pneumococcal Polysaccharide-23 12/06/2012, 03/03/2017    Qualifies for Shingles Vaccine? Yes . Due for Shingrix. Education has been provided regarding the importance of this vaccine. Pt has been advised  to call insurance company to determine out of pocket expense. Advised may also receive vaccine at local pharmacy or Health Dept. Verbalized acceptance and understanding.  Tdap: Although this vaccine is not a covered service during a Wellness Exam, does the patient  still wish to receive this vaccine today?  No .  Education has been provided regarding the importance of this vaccine. Advised may receive this vaccine at local pharmacy or Health Dept. Aware to provide a copy of the vaccination record if obtained from local pharmacy or Health Dept. Verbalized acceptance and understanding.  Flu Vaccine: Up to date  Pneumococcal Vaccine: Up to date   Screening Tests Health Maintenance  Topic Date Due  . TETANUS/TDAP  01/10/2026 (Originally 12/27/1954)  . INFLUENZA VACCINE  09/22/2018  . PNA vac Low Risk Adult  Completed   Cancer Screenings:  Colorectal Screening: No longer required.   Lung Cancer Screening: (Low Dose CT Chest recommended if Age 59-80 years, 30 pack-year currently smoking OR have quit w/in 15years.) does qualify. Hx of lung cancer and currently being seen by oncology.  Additional Screening:  Hepatitis C Screening: no longer required.  Vision Screening: Recommended annual ophthalmology exams for early detection of glaucoma and other disorders of the eye. Is the patient up to date with their annual eye exam?  Yes  Who is the provider or what is the name of the office in which the pt attends annual eye exams? [place] Screening: Recommended annual dental exams for proper oral hygiene  Community Resource Referral:  CRR required this visit?  No       Plan:    I have personally reviewed and addressed the Medicare Annual Wellness questionnaire and have noted the following in the patient's chart:  A. Medical and social history B. Use of alcohol, tobacco or illicit drugs  C. Current medications and supplements D. Functional ability and status E.  Nutritional status F.  Physical activity G. Advance directives H. List of other physicians I.  Hospitalizations, surgeries, and ER visits in previous 12 months J.  [place] such as hearing and vision if needed, cognitive and depression L. Referrals and  appointments   In addition, I have reviewed and discussed with patient certain preventive protocols, quality metrics, and best practice recommendations. A written personalized care plan for preventive services as well as general preventive health recommendations were provided to patient.   Signed,  Clemetine Marker, LPN Nurse Health Advisor   Nurse Notes: pt appreciative of visit today and had no complaints despite current medical condition. He had portable O2 tank today and reports using O2 at home about 90% of the time. He also states he drinking water and 2 Ensure shakes per day. Patient has scheduled folllow ups with oncology and palliative care.

## 2018-08-14 NOTE — Patient Instructions (Signed)
Collin Gonzales , Thank you for taking time to come for your Medicare Wellness Visit. I appreciate your ongoing commitment to your health goals. Please review the following plan we discussed and let me know if I can assist you in the future.   Screening recommendations/referrals: Colonoscopy: no longer required Recommended yearly ophthalmology/optometry visit for glaucoma screening and checkup Recommended yearly dental visit for hygiene and checkup  Vaccinations: Influenza vaccine: done 11/24/17 Pneumococcal vaccine: done 03/03/17 Tdap vaccine: due - please contact us if you get a cut or scrape Shingles vaccine: Shingrix discussed. Please contact your pharmacy for coverage information.   Advanced directives: Advance directive discussed with you today. Even though you declined this today please call our office should you change your mind and we can give you the proper paperwork for you to fill out.  Conditions/risks identified: Continue drinking at least 2 protein shakes per day for adequate nutrition.   Next appointment: Please follow up in one year for your Medicare Annual Wellness visit.    Preventive Care 75 Years and Older, Male Preventive care refers to lifestyle choices and visits with your health care provider that can promote health and wellness. What does preventive care include?  A yearly physical exam. This is also called an annual well check.  Dental exams once or twice a year.  Routine eye exams. Ask your health care provider how often you should have your eyes checked.  Personal lifestyle choices, including:  Daily care of your teeth and gums.  Regular physical activity.  Eating a healthy diet.  Avoiding tobacco and drug use.  Limiting alcohol use.  Practicing safe sex.  Taking low doses of aspirin every day.  Taking vitamin and mineral supplements as recommended by your health care provider. What happens during an annual well check? The services and  screenings done by your health care provider during your annual well check will depend on your age, overall health, lifestyle risk factors, and family history of disease. Counseling  Your health care provider may ask you questions about your:  Alcohol use.  Tobacco use.  Drug use.  Emotional well-being.  Home and relationship well-being.  Sexual activity.  Eating habits.  History of falls.  Memory and ability to understand (cognition).  Work and work Statistician. Screening  You may have the following tests or measurements:  Height, weight, and BMI.  Blood pressure.  Lipid and cholesterol levels. These may be checked every 5 years, or more frequently if you are over 35 years old.  Skin check.  Lung cancer screening. You may have this screening every year starting at age 63 if you have a 30-pack-year history of smoking and currently smoke or have quit within the past 15 years.  Fecal occult blood test (FOBT) of the stool. You may have this test every year starting at age 64.  Flexible sigmoidoscopy or colonoscopy. You may have a sigmoidoscopy every 5 years or a colonoscopy every 10 years starting at age 29.  Prostate cancer screening. Recommendations will vary depending on your family history and other risks.  Hepatitis C blood test.  Hepatitis B blood test.  Sexually transmitted disease (STD) testing.  Diabetes screening. This is done by checking your blood sugar (glucose) after you have not eaten for a while (fasting). You may have this done every 1-3 years.  Abdominal aortic aneurysm (AAA) screening. You may need this if you are a current or former smoker.  Osteoporosis. You may be screened starting at age 23 if you  are at high risk. Talk with your health care provider about your test results, treatment options, and if necessary, the need for more tests. Vaccines  Your health care provider may recommend certain vaccines, such as:  Influenza vaccine. This is  recommended every year.  Tetanus, diphtheria, and acellular pertussis (Tdap, Td) vaccine. You may need a Td booster every 10 years.  Zoster vaccine. You may need this after age 32.  Pneumococcal 13-valent conjugate (PCV13) vaccine. One dose is recommended after age 37.  Pneumococcal polysaccharide (PPSV23) vaccine. One dose is recommended after age 89. Talk to your health care provider about which screenings and vaccines you need and how often you need them. This information is not intended to replace advice given to you by your health care provider. Make sure you discuss any questions you have with your health care provider. Document Released: 03/06/2015 Document Revised: 10/28/2015 Document Reviewed: 12/09/2014 Elsevier Interactive Patient Education  2017 [place] Prevention in the Home Falls can cause injuries. They can happen to people of all ages. There are many things you can do to make your home safe and to help prevent falls. What can I do on the outside of my home?  Regularly fix the edges of walkways and driveways and fix any cracks.  Remove anything that might make you trip as you walk through a door, such as a raised step or threshold.  Trim any bushes or trees on the path to your home.  Use bright outdoor lighting.  Clear any walking paths of anything that might make someone trip, such as rocks or tools.  Regularly check to see if handrails are loose or broken. Make sure that both sides of any steps have handrails.  Any raised decks and porches should have guardrails on the edges.  Have any leaves, snow, or ice cleared regularly.  Use sand or salt on walking paths during winter.  Clean up any spills in your garage right away. This includes oil or grease spills. What can I do in the bathroom?  Use night lights.  Install grab bars by the toilet and in the tub and shower. Do not use towel bars as grab bars.  Use non-skid mats or decals in the tub or  shower.  If you need to sit down in the shower, use a plastic, non-slip stool.  Keep the floor dry. Clean up any water that spills on the floor as soon as it happens.  Remove soap buildup in the tub or shower regularly.  Attach bath mats securely with double-sided non-slip rug tape.  Do not have throw rugs and other things on the floor that can make you trip. What can I do in the bedroom?  Use night lights.  Make sure that you have a light by your bed that is easy to reach.  Do not use any sheets or blankets that are too big for your bed. They should not hang down onto the floor.  Have a firm chair that has side arms. You can use this for support while you get dressed.  Do not have throw rugs and other things on the floor that can make you trip. What can I do in the kitchen?  Clean up any spills right away.  Avoid walking on wet floors.  Keep items that you use a lot in easy-to-reach places.  If you need to reach something above you, use a strong step stool that has a grab bar.  Keep electrical cords out  of the way.  Do not use floor polish or wax that makes floors slippery. If you must use wax, use non-skid floor wax.  Do not have throw rugs and other things on the floor that can make you trip. What can I do with my stairs?  Do not leave any items on the stairs.  Make sure that there are handrails on both sides of the stairs and use them. Fix handrails that are broken or loose. Make sure that handrails are as long as the stairways.  Check any carpeting to make sure that it is firmly attached to the stairs. Fix any carpet that is loose or worn.  Avoid having throw rugs at the top or bottom of the stairs. If you do have throw rugs, attach them to the floor with carpet tape.  Make sure that you have a light switch at the top of the stairs and the bottom of the stairs. If you do not have them, ask someone to add them for you. What else can I do to help prevent falls?   Wear shoes that:  Do not have high heels.  Have rubber bottoms.  Are comfortable and fit you well.  Are closed at the toe. Do not wear sandals.  If you use a stepladder:  Make sure that it is fully opened. Do not climb a closed stepladder.  Make sure that both sides of the stepladder are locked into place.  Ask someone to hold it for you, if possible.  Clearly mark and make sure that you can see:  Any grab bars or handrails.  First and last steps.  Where the edge of each step is.  Use tools that help you move around (mobility aids) if they are needed. These include:  Canes.  Walkers.  Scooters.  Crutches.  Turn on the lights when you go into a dark area. Replace any light bulbs as soon as they burn out.  Set up your furniture so you have a clear path. Avoid moving your furniture around.  If any of your floors are uneven, fix them.  If there are any pets around you, be aware of where they are.  Review your medicines with your doctor. Some medicines can make you feel dizzy. This can increase your chance of falling. Ask your doctor what other things that you can do to help prevent falls. This information is not intended to replace advice given to you by your health care provider. Make sure you discuss any questions you have with your health care provider. Document Released: 12/04/2008 Document Revised: 07/16/2015 Document Reviewed: 03/14/2014 Elsevier Interactive Patient Education  2017 [employer].

## 2018-08-15 ENCOUNTER — Ambulatory Visit
Admission: RE | Admit: 2018-08-15 | Discharge: 2018-08-15 | Disposition: A | Discharge status: Home / Self Care | Payer: Medicare Other | Source: Ambulatory Visit | Attending: Radiation Oncology | Admitting: Radiation Oncology

## 2018-08-15 ENCOUNTER — Encounter: Payer: Self-pay | Admitting: Radiation Oncology

## 2018-08-15 ENCOUNTER — Other Ambulatory Visit: Payer: Self-pay

## 2018-08-15 VITALS — BP 116/72 | HR 92 | Temp 97.8°F | Resp 20

## 2018-08-15 DIAGNOSIS — Z923 Personal history of irradiation: Secondary | ICD-10-CM | POA: Insufficient documentation

## 2018-08-15 DIAGNOSIS — C3411 Malignant neoplasm of upper lobe, right bronchus or lung: Secondary | ICD-10-CM | POA: Insufficient documentation

## 2018-08-15 DIAGNOSIS — R918 Other nonspecific abnormal finding of lung field: Secondary | ICD-10-CM

## 2018-08-15 DIAGNOSIS — Z87891 Personal history of nicotine dependence: Secondary | ICD-10-CM | POA: Insufficient documentation

## 2018-08-15 NOTE — Progress Notes (Signed)
Radiation Oncology Follow up Note  Name: Collin Gonzales   Date:   08/15/2018 MRN:  [id number] DOB: 09/21/1935    This 83 y.o. male presents to the clinic today for 3-year follow-up status post SBRT to right upper right upper lobe for non-small cell lung cancer.  REFERRING PROVIDER: [name], MD  HPI: Patient is a 83 year old male now about 3 years having completed SBRT to his right upper lobe for non-small cell lung cancer.  Seen today in routine follow-up he is doing fairly well.Marland Kitchen  He is currently dealing with advanced high-grade urothelial carcinoma continuing on Tecentriq.  He specifically denies cough hemoptysis or chest tightness.  His most recent CT scan of chest abdomen pelvis which I have reviewed shows persistent air within the bladder concerning for fistula.  There is only some architectural distortion and consolidation in the right apex consistent with radiation change.  Any new lesions in the lungs are stable.  COMPLICATIONS OF TREATMENT: none  FOLLOW UP COMPLIANCE: keeps appointments   PHYSICAL EXAM:  BP 116/72   Pulse 92   Temp 97.8 F (36.6 C)   Resp 20  Frail-appearing wheelchair-bound male in NAD.  Well-developed well-nourished patient in NAD. HEENT reveals PERLA, EOMI, discs not visualized.  Oral cavity is clear. No oral mucosal lesions are identified. Neck is clear without evidence of cervical or supraclavicular adenopathy. Lungs are clear to A&P. Cardiac examination is essentially unremarkable with regular rate and rhythm without murmur rub or thrill. Abdomen is benign with no organomegaly or masses noted. Motor sensory and DTR levels are equal and symmetric in the upper and lower extremities. Cranial nerves II through XII are grossly intact. Proprioception is intact. No peripheral adenopathy or edema is identified. No motor or sensory levels are noted. Crude visual fields are within normal range.  RADIOLOGY RESULTS: CT scans chest abdomen pelvis reviewed and  compatible with above-stated findings  PLAN: Present time from a pulmonary standpoint patient is stable with no evidence of progressive disease.  Still on maintenance Tecentriq for his advanced urothelial carcinoma.  I have asked to see him back in 1 year for follow-up.  He continues close follow-up care and maintenance immunotherapy with medical oncology.  Patient knows to call at anytime with any concerns.  I would like to take this opportunity to thank you for allowing me to participate in the care of your patient.[name], MD

## 2018-08-19 NOTE — Progress Notes (Signed)
[name]  [phone] Fax:(336) [phone]  ID: Collin Gonzales OB: 1935-10-04  MR#: [id number]  IRS#:[id number]  Patient Care Team: [name], MD as PCP - General (Family Medicine) [name], MD as Referring Physician (Ophthalmology) [name], MD as Consulting Physician (Rheumatology) [name], MD as Referring Physician (Specialist) [name], MD as Consulting Physician (Urology) [name], MD as Referring Physician (Radiation Oncology)  CHIEF COMPLAINT: Locally advanced high-grade urothelial carcinoma.    INTERVAL HISTORY: Patient returns to clinic today for further evaluation and consideration of cycle 12 of Tecentriq.  He continues to have incontinence, but otherwise feels well and is asymptomatic.  He denies any pain.  He has no neurologic complaints.  He denies any recent fevers or illnesses.  He has chronic shortness of breath, but denies any chest pain, hemoptysis, or cough.  He has no nausea, vomiting, constipation, or diarrhea.  He denies any hematuria.  Patient offers no further specific complaints today.  REVIEW OF SYSTEMS:   Review of Systems  Constitutional: Negative.  Negative for fever, malaise/fatigue and weight loss.  Respiratory: Positive for shortness of breath. Negative for cough and hemoptysis.   Cardiovascular: Negative.  Negative for chest pain and leg swelling.  Gastrointestinal: Negative.  Negative for abdominal pain, blood in stool and melena.  Genitourinary: Positive for urgency. Negative for dysuria and hematuria.  Musculoskeletal: Negative.  Negative for back pain.  Skin: Negative.  Negative for rash.  Neurological: Negative.  Negative for sensory change, focal weakness, weakness and headaches.  Psychiatric/Behavioral: Negative.  The patient is not nervous/anxious.     As per HPI. Otherwise, a complete review of systems is negative.  PAST MEDICAL HISTORY: Past  Medical History:  Diagnosis Date  . Arthritis   . Collagen vascular disease (HCC)    rheumatoid arthritis  . COPD (chronic obstructive pulmonary disease) ([place])   . GERD (gastroesophageal reflux disease)   . History of prostate cancer   . HLD (hyperlipidemia)   . HOH (hard of hearing)   . Insomnia   . Oxygen deficit    2L HS AND PRN  . Primary cancer of right lung ([place]) 2017   rad tx's.  . Prostate cancer ([place]) 2006   Rad seed tx's.  . Stroke ([place])    2000  . Urothelial carcinoma of bladder ([place]) 10/2017   TURP to Prostate and chemo tx's.    PAST SURGICAL HISTORY: Past Surgical History:  Procedure Laterality Date  . APPENDECTOMY  1965  . CATARACT EXTRACTION W/PHACO Right 01/05/2016   Procedure: CATARACT EXTRACTION PHACO AND INTRAOCULAR LENS PLACEMENT (IOC);  Surgeon: [name], MD;  Location: [facility];  Service: Ophthalmology;  Laterality: Right;  [place] 1.14 AP% 18.1 CDE 13.44 FLUID PACK LOT # [id number] H  . CYSTOSCOPY W/ RETROGRADES Bilateral 11/01/2017   Procedure: CYSTOSCOPY WITH RETROGRADE PYELOGRAM;  Surgeon: Hollice Espy, MD;  Location: [facility];  Service: Urology;  Laterality: Bilateral;  . ENDOBRONCHIAL ULTRASOUND N/A 02/26/2015   Procedure: ENDOBRONCHIAL ULTRASOUND;  Surgeon: Flora Lipps, MD;  Location: [facility];  Service: Cardiopulmonary;  Laterality: N/A;  . [place]  . INSERTION PROSTATE RADIATION SEED  2002  . PORTA CATH INSERTION N/A 05/14/2018   Procedure: PORTA CATH INSERTION;  Surgeon: Algernon Huxley, MD;  Location: [facility];  Service: Cardiovascular;  Laterality: N/A;  . TRANSURETHRAL RESECTION OF PROSTATE N/A 11/01/2017   Procedure: TRANSURETHRAL RESECTION OF THE PROSTATE (TURP) (channel TURP);  Surgeon: Hollice Espy,  MD;  Location: [facility];  Service: Urology;  Laterality: N/A;    FAMILY HISTORY: Family History  Problem Relation Age of Onset  . Cancer Mother   . Cancer Father        Lung Cancer  . Cancer Sister        breast   . Cancer Brother        stomach    ADVANCED DIRECTIVES (Y/N):  N  HEALTH MAINTENANCE: Social History   Tobacco Use  . Smoking status: Former Smoker    Packs/day: 0.50    Years: 50.00    Pack years: 25.00    Types: Cigarettes    Quit date: 02/21/2013    Years since quitting: 5.5  . Smokeless tobacco: Never Used  . Tobacco comment: smoking cessation materials not required  Substance Use Topics  . Alcohol use: No    Alcohol/week: 0.0 standard drinks  . Drug use: No     Colonoscopy:  PAP:  Bone density:  Lipid panel:  Allergies  Allergen Reactions  . Plaquenil [Hydroxychloroquine]     Bad dreams  . Simvastatin Other (See Comments)    Bad dreams    Current Outpatient Medications  Medication Sig Dispense Refill  . Fluticasone-Umeclidin-Vilant (TRELEGY ELLIPTA) 100-62.5-25 MCG/INH AEPB Inhale 1 puff into the lungs daily. 60 each 5  . megestrol (MEGACE) 20 MG tablet Take 1 tablet (20 mg total) by mouth 2 (two) times daily. 180 tablet 1  . methotrexate (RHEUMATREX) 2.5 MG tablet Take 5 tablets by mouth once a week.    . OXYGEN Place 2 L into the nose daily as needed.     Marland Kitchen QUEtiapine (SEROQUEL) 50 MG tablet Take 1 tablet (50 mg total) by mouth at bedtime. 90 tablet 0  . folic acid (FOLVITE) 1 MG tablet Take 1 tablet (1 mg total) by mouth daily. (Patient not taking: Reported on 08/14/2018) 30 tablet 5   No current facility-administered medications for this visit.     OBJECTIVE: Vitals:   08/22/18 1032  BP: (!) 92/55  Pulse: 81  Resp: 18  Temp: (!) 97.2 F (36.2 C)     Body mass index is 17.43 kg/m.    ECOG FS:0 - Asymptomatic  General: Well-developed, well-nourished, no acute distress. Eyes: Pink conjunctiva, anicteric sclera. HEENT: Normocephalic, moist mucous membranes. Lungs: Clear to auscultation bilaterally. Heart: Regular rate and rhythm. No rubs, murmurs, or gallops. Abdomen: Soft, nontender, nondistended. No organomegaly noted, normoactive bowel sounds.  Musculoskeletal: No edema, cyanosis, or clubbing. Neuro: Alert, answering all questions appropriately. Cranial nerves grossly intact. Skin: No rashes or petechiae noted. Psych: Normal affect.  LAB RESULTS:  Lab Results  Component Value Date   NA 141 08/22/2018   K 3.5 08/22/2018   CL 109 08/22/2018   CO2 25 08/22/2018   GLUCOSE 100 (H) 08/22/2018   BUN 21 08/22/2018   CREATININE 1.12 08/22/2018   CALCIUM 8.3 (L) 08/22/2018   PROT 8.2 (H) 08/22/2018   ALBUMIN 2.3 (L) 08/22/2018   AST 14 (L) 08/22/2018   ALT 8 08/22/2018   ALKPHOS 53 08/22/2018   BILITOT 0.4 08/22/2018   GFRNONAA >60 08/22/2018   GFRAA >60 08/22/2018    Lab Results  Component Value Date   WBC 9.8 08/22/2018   NEUTROABS 7.8 (H) 08/22/2018   HGB 8.6 (L) 08/22/2018   HCT 28.6 (L) 08/22/2018   MCV 91.7 08/22/2018   PLT 285 08/22/2018     STUDIES: No results found.  ASSESSMENT: Locally advanced high-grade urothelial  carcinoma.  PLAN:    1. Locally advanced high-grade urothelial carcinoma: Case discussed with urology, radiation oncology, as well as multidisciplinary tumor board.  Patient is not a surgical candidate nor can he receive additional XRT given his history of prostate cancer and brachii therapy seed placement.  Though he has a decent performance status, patient is frail and likely would not tolerate cisplatin based therapy.  CT scan results from May 25, 2018 reviewed independently with no obvious evidence of progressive disease.  Continue palliative treatment with Tecentriq.  Previously, hospice and end-of-life were discussed but patient is not interested at this time.  Patient was evaluated by urology in July 26, 2018 who recommended continued conservative management.  No cystoscopy is planned at this time.  Proceed with cycle 12 of Tecentriq today.  Return to clinic in 3 weeks for further evaluation and consideration of cycle 13.  Will reimage prior to next treatment.     2.  Right upper lobe lung  mass: Previously, although biopsy was negative, patient had a positive PET scan that was highly suspicious for underlying malignancy. He underwent SBRT in approximately May 2017.   3.  Shortness of breath: Chronic and unchanged.  Patient now requires oxygen 24 hours/day.   4.  Anemia: Chronic and unchanged.  Patient's hemoglobin is 8.6 today. 5.  Hematuria: Patient does not complain of this today.  Appreciate urology input. 6.  Renal insufficiency: Resolved.  Patient expressed understanding and was in agreement with this plan. He also understands that He can call clinic at any time with any questions, concerns, or complaints.    Lloyd Huger, MD   08/23/2018 6:35 AM

## 2018-08-21 ENCOUNTER — Other Ambulatory Visit: Payer: Self-pay

## 2018-08-21 ENCOUNTER — Telehealth: Payer: Self-pay | Admitting: Oncology

## 2018-08-21 NOTE — Telephone Encounter (Signed)
Spoke with pt to confirm appt date/time, do pre-appt screen which was completed, and adv of Covid-19 guidelines for appt regarding screening questions, temperature check, face mask required, and no visitors allowed °

## 2018-08-22 ENCOUNTER — Encounter: Payer: Self-pay | Admitting: Oncology

## 2018-08-22 ENCOUNTER — Inpatient Hospital Stay: Payer: Medicare Other | Attending: Oncology | Admitting: *Deleted

## 2018-08-22 ENCOUNTER — Inpatient Hospital Stay (HOSPITAL_BASED_OUTPATIENT_CLINIC_OR_DEPARTMENT_OTHER): Payer: Medicare Other | Admitting: Hospice and Palliative Medicine

## 2018-08-22 ENCOUNTER — Inpatient Hospital Stay: Payer: Medicare Other

## 2018-08-22 ENCOUNTER — Inpatient Hospital Stay (HOSPITAL_BASED_OUTPATIENT_CLINIC_OR_DEPARTMENT_OTHER): Payer: Medicare Other | Admitting: Oncology

## 2018-08-22 ENCOUNTER — Other Ambulatory Visit: Payer: Self-pay

## 2018-08-22 VITALS — BP 92/55 | HR 81 | Temp 97.2°F | Resp 18 | Wt 108.0 lb

## 2018-08-22 DIAGNOSIS — G893 Neoplasm related pain (acute) (chronic): Secondary | ICD-10-CM | POA: Diagnosis not present

## 2018-08-22 DIAGNOSIS — Z9981 Dependence on supplemental oxygen: Secondary | ICD-10-CM

## 2018-08-22 DIAGNOSIS — J449 Chronic obstructive pulmonary disease, unspecified: Secondary | ICD-10-CM

## 2018-08-22 DIAGNOSIS — Z8546 Personal history of malignant neoplasm of prostate: Secondary | ICD-10-CM | POA: Insufficient documentation

## 2018-08-22 DIAGNOSIS — Z8673 Personal history of transient ischemic attack (TIA), and cerebral infarction without residual deficits: Secondary | ICD-10-CM | POA: Diagnosis not present

## 2018-08-22 DIAGNOSIS — Z87891 Personal history of nicotine dependence: Secondary | ICD-10-CM

## 2018-08-22 DIAGNOSIS — C679 Malignant neoplasm of bladder, unspecified: Secondary | ICD-10-CM | POA: Diagnosis not present

## 2018-08-22 DIAGNOSIS — D649 Anemia, unspecified: Secondary | ICD-10-CM

## 2018-08-22 DIAGNOSIS — R918 Other nonspecific abnormal finding of lung field: Secondary | ICD-10-CM | POA: Insufficient documentation

## 2018-08-22 DIAGNOSIS — Z5112 Encounter for antineoplastic immunotherapy: Secondary | ICD-10-CM | POA: Insufficient documentation

## 2018-08-22 DIAGNOSIS — Z79899 Other long term (current) drug therapy: Secondary | ICD-10-CM

## 2018-08-22 DIAGNOSIS — Z515 Encounter for palliative care: Secondary | ICD-10-CM

## 2018-08-22 DIAGNOSIS — Z95828 Presence of other vascular implants and grafts: Secondary | ICD-10-CM

## 2018-08-22 LAB — CBC WITH DIFFERENTIAL/PLATELET
Abs Immature Granulocytes: 0.06 10*3/uL (ref 0.00–0.07)
Basophils Absolute: 0 10*3/uL (ref 0.0–0.1)
Basophils Relative: 0 %
Eosinophils Absolute: 0.2 10*3/uL (ref 0.0–0.5)
Eosinophils Relative: 2 %
HCT: 28.6 % — ABNORMAL LOW (ref 39.0–52.0)
Hemoglobin: 8.6 g/dL — ABNORMAL LOW (ref 13.0–17.0)
Immature Granulocytes: 1 %
Lymphocytes Relative: 11 %
Lymphs Abs: 1.1 10*3/uL (ref 0.7–4.0)
MCH: 27.6 pg (ref 26.0–34.0)
MCHC: 30.1 g/dL (ref 30.0–36.0)
MCV: 91.7 fL (ref 80.0–100.0)
Monocytes Absolute: 0.6 10*3/uL (ref 0.1–1.0)
Monocytes Relative: 6 %
Neutro Abs: 7.8 10*3/uL — ABNORMAL HIGH (ref 1.7–7.7)
Neutrophils Relative %: 80 %
Platelets: 285 10*3/uL (ref 150–400)
RBC: 3.12 MIL/uL — ABNORMAL LOW (ref 4.22–5.81)
RDW: 18.6 % — ABNORMAL HIGH (ref 11.5–15.5)
WBC: 9.8 10*3/uL (ref 4.0–10.5)
nRBC: 0 % (ref 0.0–0.2)

## 2018-08-22 LAB — COMPREHENSIVE METABOLIC PANEL
ALT: 8 U/L (ref 0–44)
AST: 14 U/L — ABNORMAL LOW (ref 15–41)
Albumin: 2.3 g/dL — ABNORMAL LOW (ref 3.5–5.0)
Alkaline Phosphatase: 53 U/L (ref 38–126)
Anion gap: 7 (ref 5–15)
BUN: 21 mg/dL (ref 8–23)
CO2: 25 mmol/L (ref 22–32)
Calcium: 8.3 mg/dL — ABNORMAL LOW (ref 8.9–10.3)
Chloride: 109 mmol/L (ref 98–111)
Creatinine, Ser: 1.12 mg/dL (ref 0.61–1.24)
GFR calc Af Amer: >60 mL/min (ref >60)
GFR calc non Af Amer: >60 mL/min (ref >60)
Glucose, Bld: 100 mg/dL — ABNORMAL HIGH (ref 70–99)
Potassium: 3.5 mmol/L (ref 3.5–5.1)
Sodium: 141 mmol/L (ref 135–145)
Total Bilirubin: 0.4 mg/dL (ref 0.3–1.2)
Total Protein: 8.2 g/dL — ABNORMAL HIGH (ref 6.5–8.1)

## 2018-08-22 MED ORDER — SODIUM CHLORIDE 0.9 % IV SOLN
1200.0000 mg | Freq: Once | INTRAVENOUS | Status: AC
  Administered 2018-08-22: 1200 mg via INTRAVENOUS
  Filled 2018-08-22: qty 20

## 2018-08-22 MED ORDER — SODIUM CHLORIDE 0.9 % IV SOLN
Freq: Once | INTRAVENOUS | Status: AC
  Administered 2018-08-22: 12:00:00 via INTRAVENOUS
  Filled 2018-08-22: qty 250

## 2018-08-22 MED ORDER — SODIUM CHLORIDE 0.9% FLUSH
10.0000 mL | Freq: Once | INTRAVENOUS | Status: AC
  Administered 2018-08-22: 10 mL via INTRAVENOUS
  Filled 2018-08-22: qty 10

## 2018-08-22 MED ORDER — HEPARIN SOD (PORK) LOCK FLUSH 100 UNIT/ML IV SOLN
500.0000 [IU] | Freq: Once | INTRAVENOUS | Status: AC | PRN
  Administered 2018-08-22: 500 [IU]
  Filled 2018-08-22: qty 5

## 2018-08-22 NOTE — Progress Notes (Signed)
[place]  Telephone:([phone] [phone]   Name: Collin Gonzales Date: 08/22/2018 MRN: [id number]  DOB: 21-Apr-1935  Patient Care Team: [name], MD as PCP - General (Family Medicine) [name], MD as Referring Physician (Ophthalmology) [name], MD as Consulting Physician (Rheumatology) [name], MD as Referring Physician (Specialist) [name], MD as Consulting Physician (Urology) [name], MD as Referring Physician (Radiation Oncology)    REASON FOR CONSULTATION: Palliative Care consult requested for this 83 y.o. male with multiple medical problems including locally advanced high-grade urothelial carcinoma.  PMH also notable for O2 dependent COPD, PET positive lung mass status post SBRT, prostate cancer status post brachii therapy seed implant, history of CVA.  Patient is felt not to be a surgical candidate for his urothelial carcinoma and treatment options are felt to be somewhat limited based on frailty.  He was referred to palliative care to help address goals.   SOCIAL HISTORY:     reports that he quit smoking about 5 years ago. His smoking use included cigarettes. He has a 25.00 pack-year smoking history. He has never used smokeless tobacco. He reports that he does not drink alcohol or use drugs.   Patient is divorced.  He lives at home alone.  He has multiple children who live throughout the state.  Patient used to work in [employer].  ADVANCE DIRECTIVES:  Does not have  CODE STATUS:   PAST MEDICAL HISTORY: Past Medical History:  Diagnosis Date  . Arthritis   . Collagen vascular disease (HCC)    rheumatoid arthritis  . COPD (chronic obstructive pulmonary disease) ([place])   . GERD (gastroesophageal reflux disease)   . History of prostate cancer   . HLD (hyperlipidemia)   . HOH (hard of hearing)   . Insomnia   . Oxygen deficit    2L HS AND PRN  .  Primary cancer of right lung ([place]) 2017   rad tx's.  . Prostate cancer ([place]) 2006   Rad seed tx's.  . Stroke ([place])    2000  . Urothelial carcinoma of bladder ([place]) 10/2017   TURP to Prostate and chemo tx's.    PAST SURGICAL HISTORY:  Past Surgical History:  Procedure Laterality Date  . APPENDECTOMY  1965  . CATARACT EXTRACTION W/PHACO Right 01/05/2016   Procedure: CATARACT EXTRACTION PHACO AND INTRAOCULAR LENS PLACEMENT (IOC);  Surgeon: [name], MD;  Location: [facility];  Service: Ophthalmology;  Laterality: Right;  [place] 1.14 AP% 18.1 CDE 13.44 FLUID PACK LOT # [id number] H  . CYSTOSCOPY W/ RETROGRADES Bilateral 11/01/2017   Procedure: CYSTOSCOPY WITH RETROGRADE PYELOGRAM;  Surgeon: Hollice Espy, MD;  Location: [facility];  Service: Urology;  Laterality: Bilateral;  . ENDOBRONCHIAL ULTRASOUND N/A 02/26/2015   Procedure: ENDOBRONCHIAL ULTRASOUND;  Surgeon: Flora Lipps, MD;  Location: [facility];  Service: Cardiopulmonary;  Laterality: N/A;  . [place]  . INSERTION PROSTATE RADIATION SEED  2002  . PORTA CATH INSERTION N/A 05/14/2018   Procedure: PORTA CATH INSERTION;  Surgeon: Algernon Huxley, MD;  Location: [facility];  Service: Cardiovascular;  Laterality: N/A;  . TRANSURETHRAL RESECTION OF PROSTATE N/A 11/01/2017   Procedure: TRANSURETHRAL RESECTION OF THE PROSTATE (TURP) (channel TURP);  Surgeon: Hollice Espy, MD;  Location: [facility];  Service: Urology;  Laterality: N/A;    HEMATOLOGY/ONCOLOGY HISTORY:  Oncology History  Urothelial carcinoma of bladder ([place])  11/12/2017 Initial Diagnosis   Urothelial carcinoma of bladder ([place])  11/23/2017 -  Chemotherapy   The patient had atezolizumab (TECENTRIQ) 1,200 mg in sodium chloride 0.9 % 250 mL chemo infusion, 1,200 mg, Intravenous, Once, 12 of 12 cycles Administration: 1,200 mg (11/23/2017), 1,200 mg (12/14/2017), 1,200 mg (01/04/2018), 1,200 mg (01/25/2018), 1,200 mg (03/22/2018), 1,200 mg (02/28/2018), 1,200 mg  (04/12/2018), 1,200 mg (05/30/2018), 1,200 mg (06/20/2018), 1,200 mg (07/11/2018), 1,200 mg (08/01/2018), 1,200 mg (08/22/2018)  for chemotherapy treatment.      ALLERGIES:  is allergic to plaquenil [hydroxychloroquine] and simvastatin.  MEDICATIONS:  Current Outpatient Medications  Medication Sig Dispense Refill  . Fluticasone-Umeclidin-Vilant (TRELEGY ELLIPTA) 100-62.5-25 MCG/INH AEPB Inhale 1 puff into the lungs daily. 60 each 5  . folic acid (FOLVITE) 1 MG tablet Take 1 tablet (1 mg total) by mouth daily. (Patient not taking: Reported on 08/14/2018) 30 tablet 5  . megestrol (MEGACE) 20 MG tablet Take 1 tablet (20 mg total) by mouth 2 (two) times daily. 180 tablet 1  . methotrexate (RHEUMATREX) 2.5 MG tablet Take 5 tablets by mouth once a week.    . OXYGEN Place 2 L into the nose daily as needed.     Marland Kitchen QUEtiapine (SEROQUEL) 50 MG tablet Take 1 tablet (50 mg total) by mouth at bedtime. 90 tablet 0   No current facility-administered medications for this visit.     VITAL SIGNS: There were no vitals taken for this visit. There were no vitals filed for this visit.  Estimated body mass index is 17.43 kg/m as calculated from the following:   Height as of 08/14/18: 5\' 6"  (1.676 m).   Weight as of an earlier encounter on 08/22/18: 108 lb (49 kg).  LABS: CBC:    Component Value Date/Time   WBC 9.8 08/22/2018 0954   HGB 8.6 (L) 08/22/2018 0954   HGB 12.3 (L) 11/21/2014 1010   HCT 28.6 (L) 08/22/2018 0954   HCT 39.3 11/21/2014 1010   PLT 285 08/22/2018 0954   PLT 387 (H) 11/21/2014 1010   MCV 91.7 08/22/2018 0954   MCV 95 11/21/2014 1010   MCV 95 01/05/2014 0429   NEUTROABS 7.8 (H) 08/22/2018 0954   NEUTROABS 3.2 11/21/2014 1010   NEUTROABS 5.3 01/05/2014 0429   LYMPHSABS 1.1 08/22/2018 0954   LYMPHSABS 1.7 11/21/2014 1010   LYMPHSABS 0.4 (L) 01/05/2014 0429   MONOABS 0.6 08/22/2018 0954   MONOABS 0.2 01/05/2014 0429   EOSABS 0.2 08/22/2018 0954   EOSABS 0.1 11/21/2014 1010   EOSABS  0.0 01/05/2014 0429   BASOSABS 0.0 08/22/2018 0954   BASOSABS 0.0 11/21/2014 1010   BASOSABS 0.0 01/05/2014 0429   Comprehensive Metabolic Panel:    Component Value Date/Time   NA 141 08/22/2018 0954   NA 139 12/08/2014 1004   NA 138 01/04/2014 0430   K 3.5 08/22/2018 0954   K 4.4 01/04/2014 0430   CL 109 08/22/2018 0954   CL 102 01/04/2014 0430   CO2 25 08/22/2018 0954   CO2 30 01/04/2014 0430   BUN 21 08/22/2018 0954   BUN 15 12/08/2014 1004   BUN 17 01/04/2014 0430   CREATININE 1.12 08/22/2018 0954   CREATININE 1.17 10/11/2017 1239   GLUCOSE 100 (H) 08/22/2018 0954   GLUCOSE 121 (H) 01/04/2014 0430   CALCIUM 8.3 (L) 08/22/2018 0954   CALCIUM 8.4 (L) 01/04/2014 0430   AST 14 (L) 08/22/2018 0954   ALT 8 08/22/2018 0954   ALKPHOS 53 08/22/2018 0954   BILITOT 0.4 08/22/2018 0954   BILITOT 0.3 12/08/2014 1004   PROT  8.2 (H) 08/22/2018 0954   PROT 8.6 (H) 12/08/2014 1004   ALBUMIN 2.3 (L) 08/22/2018 0954   ALBUMIN 3.7 12/08/2014 1004    RADIOGRAPHIC STUDIES: No results found.  PERFORMANCE STATUS (ECOG) : 1  Review of Systems As noted above. Otherwise, a complete review of systems is negative.  Physical Exam General: NAD, frail appearing, thin Pulmonary: unlabored Extremities: no edema Skin: no rashes Neurological: Weakness but otherwise nonfocal  IMPRESSION: Routine follow-up visit today with patient to discuss pain management.  Patient was seen while he was receiving treatment in the infusion area.   Patient denies changes or concerns.  He reports doing reasonably well.  He has no distressing symptoms at present such as pain, nausea or vomiting, diarrhea or constipation.  He reports sleeping well.  He also feels his oral intake is adequate.  Weight has down trended some over the past 2 months.  Weight was 118 pounds in May and June and is down to 108 pounds today.  Again discussed the importance of caloric intake and high-protein foods.  Encouraged him to increase  oral supplements to 2 or 3 times daily.  Will refer to RD.  I again discussed ACP documents.  I had previously reviewed with him a MOST Form.  Patient says that he wants his daughter's input and keeps forgetting to show to her.   PLAN: -Continue current scope of treatment -Continue Norco 5-325mg  Q6H prn for pain -ACP/MOST form when he returns to the clinic (not infusion area) -Referral to RD -RTC in 1 month   Patient expressed understanding and was in agreement with this plan. He also understands that He can call clinic at any time with any questions, concerns, or complaints.     Time Total: 15 minutes  Visit consisted of counseling and education dealing with the complex and emotionally intense issues of symptom management and palliative care in the setting of serious and potentially life-threatening illness.Greater than 50%  of this time was spent counseling and coordinating care related to the above assessment and plan.  Signed by: Altha Harm, PhD, NP-C [phone] (Work Cell)

## 2018-08-22 NOTE — Progress Notes (Signed)
Reports "about the same".  Pt states has fair appetite but "not always taking megace".

## 2018-08-23 LAB — THYROID PANEL WITH TSH
Free Thyroxine Index: 1.9 (ref 1.2–4.9)
T3 Uptake Ratio: 29 % (ref 24–39)
T4, Total: 6.6 ug/dL (ref 4.5–12.0)
TSH: 2.1 u[IU]/mL (ref 0.450–4.500)

## 2018-08-30 ENCOUNTER — Telehealth: Payer: Self-pay

## 2018-08-30 NOTE — Telephone Encounter (Signed)
Nutrition Assessment:  RD working remotely.  Referral from [place], NP for weight loss  83 year old male with locally advanced high grade urothelial carcinoma.  Past medical history of COPD, lung mass s/p SBRT, prostate cancer, CVA.  Patient currently receiving tecentriq.    Spoke with patient via phone this am.  Patient reports that his appetite is up and down.  Reports that he typically eats sausage biscuit for breakfast (gets up later), drinks ensure or boost plus around lunch time or early afternoon and then eats supper around 6pm of meat and 2 vegetables.  Reports that he does not have any teeth but still able to chew meats without difficulty.  Reports that he lives alone and does not do any cooking anymore (heats up foods in the microwave). Reports that his daughter and sisters bring him meals.  His daughter buys his groceries.  He reports that he does not have much energy any more to cook.  Reports that he drinks about 1 sometimes 2 oral nutrition shakes daily.    Denies nausea, diarrhea, some constipation.  Medications: folic acid, megace, compazine, stool softner  Labs: reviewed  Anthropometrics:   Height: 66 inches Weight: 108 lb on 7/1 Noted 118 lb on 5/20 (in cancer center), weight in 05/09/2018 108 lb.  BMI: 17  8% weight loss in the last 2 months, significant   Estimated Energy Needs  Kcals: 1500-1715 calories Protein: 75-85 g Fluid: 1.7 L  NUTRITION DIAGNOSIS: Inadequate oral intake related to cancer and cancer related treatment side effects as evidenced by 8% weight loss in the last 2 months   INTERVENTION:  Discussed ways to increase calories and protein and provided examples. Encouraged 350 calorie shake or higher at least 2 times per day.  Patient will pick up 1st case of ensure enlive next week.   Contact information provided.      MONITORING, EVALUATION, GOAL: Patient will consume adequate calories and protein to prevent further weight loss.   NEXT VISIT:  phone f/u July 30th  Collin Gonzales, [place], [place] Registered Dietitian [phone] (pager)

## 2018-09-06 DIAGNOSIS — J449 Chronic obstructive pulmonary disease, unspecified: Secondary | ICD-10-CM | POA: Diagnosis not present

## 2018-09-07 ENCOUNTER — Ambulatory Visit: Admission: RE | Admit: 2018-09-07 | Payer: Medicare Other | Source: Ambulatory Visit

## 2018-09-09 NOTE — Progress Notes (Signed)
[place]  Telephone:(336) [phone] Fax:(336) [phone]  ID: [name]: 1935/11/16  MR#: [id number]  DXA#:[id number]  Patient Care Team: [name], MD as PCP - General (Family Medicine) [name], MD as Referring Physician (Ophthalmology) [name], MD as Consulting Physician (Rheumatology) [name], MD as Referring Physician (Specialist) [name], MD as Consulting Physician (Urology) [name], MD as Referring Physician (Radiation Oncology)  CHIEF COMPLAINT: Locally advanced high-grade urothelial carcinoma.    INTERVAL HISTORY: Patient returns to clinic today for further evaluation and consideration of cycle 13 of Tecentriq.  He was a no-show to his restaging CT scan last week, so this will need to be rescheduled.  He continues to have incontinence, but otherwise feels well and is asymptomatic.  He denies any pain.  He has no neurologic complaints.  He denies any recent fevers or illnesses.  He has chronic shortness of breath, but denies any chest pain, hemoptysis, or cough.  He has no nausea, vomiting, constipation, or diarrhea.  He denies any hematuria.  Patient feels at his baseline and offers no further specific complaints today.  REVIEW OF SYSTEMS:   Review of Systems  Constitutional: Negative.  Negative for fever, malaise/fatigue and weight loss.  Respiratory: Positive for shortness of breath. Negative for cough and hemoptysis.   Cardiovascular: Negative.  Negative for chest pain and leg swelling.  Gastrointestinal: Negative.  Negative for abdominal pain, blood in stool and melena.  Genitourinary: Positive for frequency and urgency. Negative for dysuria and hematuria.  Musculoskeletal: Negative.  Negative for back pain.  Skin: Negative.  Negative for rash.  Neurological: Negative.  Negative for sensory change, focal weakness, weakness and headaches.  Psychiatric/Behavioral: Negative.  The patient is  not nervous/anxious.     As per HPI. Otherwise, a complete review of systems is negative.  PAST MEDICAL HISTORY: Past Medical History:  Diagnosis Date  . Arthritis   . Collagen vascular disease (HCC)    rheumatoid arthritis  . COPD (chronic obstructive pulmonary disease) ([place])   . GERD (gastroesophageal reflux disease)   . History of prostate cancer   . HLD (hyperlipidemia)   . HOH (hard of hearing)   . Insomnia   . Oxygen deficit    2L HS AND PRN  . Primary cancer of right lung ([place]) 2017   rad tx's.  . Prostate cancer ([place]) 2006   Rad seed tx's.  . Stroke ([place])    2000  . Urothelial carcinoma of bladder ([place]) 10/2017   TURP to Prostate and chemo tx's.    PAST SURGICAL HISTORY: Past Surgical History:  Procedure Laterality Date  . APPENDECTOMY  1965  . CATARACT EXTRACTION W/PHACO Right 01/05/2016   Procedure: CATARACT EXTRACTION PHACO AND INTRAOCULAR LENS PLACEMENT (IOC);  Surgeon: [name], MD;  Location: [facility] ORS;  Service: Ophthalmology;  Laterality: Right;  [place] 1.14 AP% 18.1 CDE 13.44 FLUID PACK LOT # [id number] H  . CYSTOSCOPY W/ RETROGRADES Bilateral 11/01/2017   Procedure: CYSTOSCOPY WITH RETROGRADE PYELOGRAM;  Surgeon: Hollice Espy, MD;  Location: [facility];  Service: Urology;  Laterality: Bilateral;  . ENDOBRONCHIAL ULTRASOUND N/A 02/26/2015   Procedure: ENDOBRONCHIAL ULTRASOUND;  Surgeon: Flora Lipps, MD;  Location: [facility];  Service: Cardiopulmonary;  Laterality: N/A;  . [place]  . INSERTION PROSTATE RADIATION SEED  2002  . PORTA CATH INSERTION N/A 05/14/2018   Procedure: PORTA CATH INSERTION;  Surgeon: Algernon Huxley, MD;  Location: [facility];  Service: Cardiovascular;  Laterality: N/A;  . TRANSURETHRAL RESECTION OF PROSTATE N/A 11/01/2017   Procedure: TRANSURETHRAL RESECTION OF THE PROSTATE (TURP) (channel TURP);  Surgeon: Hollice Espy, MD;  Location: [facility];  Service: Urology;  Laterality: N/A;    FAMILY HISTORY: Family  History  Problem Relation Age of Onset  . Cancer Mother   . Cancer Father        Lung Cancer  . Cancer Sister        breast  . Cancer Brother        stomach    ADVANCED DIRECTIVES (Y/N):  N  HEALTH MAINTENANCE: Social History   Tobacco Use  . Smoking status: Former Smoker    Packs/day: 0.50    Years: 50.00    Pack years: 25.00    Types: Cigarettes    Quit date: 02/21/2013    Years since quitting: 5.5  . Smokeless tobacco: Never Used  . Tobacco comment: smoking cessation materials not required  Substance Use Topics  . Alcohol use: No    Alcohol/week: 0.0 standard drinks  . Drug use: No     Colonoscopy:  PAP:  Bone density:  Lipid panel:  Allergies  Allergen Reactions  . Plaquenil [Hydroxychloroquine]     Bad dreams  . Simvastatin Other (See Comments)    Bad dreams    Current Outpatient Medications  Medication Sig Dispense Refill  . Fluticasone-Umeclidin-Vilant (TRELEGY ELLIPTA) 100-62.5-25 MCG/INH AEPB Inhale 1 puff into the lungs daily. 60 each 5  . folic acid (FOLVITE) 1 MG tablet Take 1 tablet (1 mg total) by mouth daily. 30 tablet 5  . HYDROcodone-acetaminophen (NORCO) 5-325 MG tablet Take 1 tablet by mouth every 6 (six) hours as needed for moderate pain. 45 tablet 0  . megestrol (MEGACE) 20 MG tablet Take 1 tablet (20 mg total) by mouth 2 (two) times daily. 180 tablet 1  . methotrexate (RHEUMATREX) 2.5 MG tablet Take 4 tablets by mouth once a week.     . OXYGEN Place 2 L into the nose daily as needed.     Marland Kitchen QUEtiapine (SEROQUEL) 50 MG tablet Take 1 tablet (50 mg total) by mouth at bedtime. 90 tablet 0   No current facility-administered medications for this visit.     OBJECTIVE: Vitals:   09/13/18 1044 09/13/18 1047  BP: (!) 96/55 113/60  Pulse: 81 77  Temp: (!) 97.1 F (36.2 C)      Body mass index is 18.72 kg/m.    ECOG FS:0 - Asymptomatic  General: Well-developed, well-nourished, no acute distress. Eyes: Pink conjunctiva, anicteric sclera.  HEENT: Normocephalic, moist mucous membranes. Lungs: Clear to auscultation bilaterally. Heart: Regular rate and rhythm. No rubs, murmurs, or gallops. Abdomen: Soft, nontender, nondistended. No organomegaly noted, normoactive bowel sounds. Musculoskeletal: No edema, cyanosis, or clubbing. Neuro: Alert, answering all questions appropriately. Cranial nerves grossly intact. Skin: No rashes or petechiae noted. Psych: Normal affect.  LAB RESULTS:  Lab Results  Component Value Date   NA 137 09/11/2018   K 4.0 09/11/2018   CL 101 09/11/2018   CO2 27 09/11/2018   GLUCOSE 139 (H) 09/11/2018   BUN 22 09/11/2018   CREATININE 1.09 09/11/2018   CALCIUM 8.7 (L) 09/11/2018   PROT 8.6 (H) 09/11/2018   ALBUMIN 2.4 (L) 09/11/2018   AST 15 09/11/2018   ALT 7 09/11/2018   ALKPHOS 62 09/11/2018   BILITOT 0.4 09/11/2018   GFRNONAA >60 09/11/2018   GFRAA >60 09/11/2018    Lab Results  Component Value Date  WBC 6.4 09/11/2018   NEUTROABS 4.5 09/11/2018   HGB 9.4 (L) 09/11/2018   HCT 32.4 (L) 09/11/2018   MCV 94.7 09/11/2018   PLT 312 09/11/2018     STUDIES: No results found.  ASSESSMENT: Locally advanced high-grade urothelial carcinoma.  PLAN:    1. Locally advanced high-grade urothelial carcinoma: Case discussed with urology, radiation oncology, as well as multidisciplinary tumor board.  Patient is not a surgical candidate nor can he receive additional XRT given his history of prostate cancer and brachii therapy seed placement.  Though he has a decent performance status, patient is frail and likely would not tolerate cisplatin based therapy.  CT scan results from May 25, 2018 reviewed independently with no obvious evidence of progressive disease.  Continue palliative treatment with Tecentriq.  Previously, hospice and end-of-life were discussed but patient is not interested at this time.  Patient was evaluated by urology in July 26, 2018 who recommended continued conservative management.   No cystoscopy is planned at this time.  Patient missed his restaging CT scan, and this will be scheduled in the next 1 to 2 weeks.  Proceed with cycle 13 of Tecentriq today.  Return to clinic in 3 weeks for further evaluation, discussion of his imaging results, and consideration of cycle 14.   2.  Right upper lobe lung mass: Previously, although biopsy was negative, patient had a positive PET scan that was highly suspicious for underlying malignancy. He underwent SBRT in approximately May 2017.   3.  Shortness of breath: Chronic and unchanged.  Patient now requires oxygen 24 hours/day.   4.  Anemia: Chronic and unchanged.  Hemoglobin mildly improved to 9.4 today. 5.  Hematuria: Patient does not complain of this today.  Appreciate urology input. 6.  Renal insufficiency: Resolved.  I spent a total of 30 minutes face-to-face with the patient of which greater than 50% of the visit was spent in counseling and coordination of care as detailed above.   Patient expressed understanding and was in agreement with this plan. He also understands that He can call clinic at any time with any questions, concerns, or complaints.    Lloyd Huger, MD   09/14/2018 7:07 AM

## 2018-09-11 ENCOUNTER — Inpatient Hospital Stay (HOSPITAL_BASED_OUTPATIENT_CLINIC_OR_DEPARTMENT_OTHER): Payer: Medicare Other | Admitting: Hospice and Palliative Medicine

## 2018-09-11 ENCOUNTER — Inpatient Hospital Stay: Payer: Medicare Other | Admitting: *Deleted

## 2018-09-11 ENCOUNTER — Other Ambulatory Visit: Payer: Self-pay

## 2018-09-11 ENCOUNTER — Encounter: Payer: Self-pay | Admitting: Hospice and Palliative Medicine

## 2018-09-11 VITALS — BP 104/51 | HR 78 | Temp 96.9°F | Resp 20

## 2018-09-11 DIAGNOSIS — Z8673 Personal history of transient ischemic attack (TIA), and cerebral infarction without residual deficits: Secondary | ICD-10-CM | POA: Diagnosis not present

## 2018-09-11 DIAGNOSIS — D649 Anemia, unspecified: Secondary | ICD-10-CM | POA: Diagnosis not present

## 2018-09-11 DIAGNOSIS — C689 Malignant neoplasm of urinary organ, unspecified: Secondary | ICD-10-CM

## 2018-09-11 DIAGNOSIS — Z95828 Presence of other vascular implants and grafts: Secondary | ICD-10-CM

## 2018-09-11 DIAGNOSIS — J449 Chronic obstructive pulmonary disease, unspecified: Secondary | ICD-10-CM | POA: Diagnosis not present

## 2018-09-11 DIAGNOSIS — Z9981 Dependence on supplemental oxygen: Secondary | ICD-10-CM | POA: Diagnosis not present

## 2018-09-11 DIAGNOSIS — R531 Weakness: Secondary | ICD-10-CM | POA: Diagnosis not present

## 2018-09-11 DIAGNOSIS — G893 Neoplasm related pain (acute) (chronic): Secondary | ICD-10-CM

## 2018-09-11 DIAGNOSIS — Z515 Encounter for palliative care: Secondary | ICD-10-CM

## 2018-09-11 DIAGNOSIS — Z87891 Personal history of nicotine dependence: Secondary | ICD-10-CM | POA: Diagnosis not present

## 2018-09-11 DIAGNOSIS — C679 Malignant neoplasm of bladder, unspecified: Secondary | ICD-10-CM

## 2018-09-11 DIAGNOSIS — Z79899 Other long term (current) drug therapy: Secondary | ICD-10-CM | POA: Diagnosis not present

## 2018-09-11 DIAGNOSIS — R918 Other nonspecific abnormal finding of lung field: Secondary | ICD-10-CM | POA: Diagnosis not present

## 2018-09-11 DIAGNOSIS — Z5112 Encounter for antineoplastic immunotherapy: Secondary | ICD-10-CM | POA: Diagnosis not present

## 2018-09-11 LAB — COMPREHENSIVE METABOLIC PANEL
ALT: 7 U/L (ref 0–44)
AST: 15 U/L (ref 15–41)
Albumin: 2.4 g/dL — ABNORMAL LOW (ref 3.5–5.0)
Alkaline Phosphatase: 62 U/L (ref 38–126)
Anion gap: 9 (ref 5–15)
BUN: 22 mg/dL (ref 8–23)
CO2: 27 mmol/L (ref 22–32)
Calcium: 8.7 mg/dL — ABNORMAL LOW (ref 8.9–10.3)
Chloride: 101 mmol/L (ref 98–111)
Creatinine, Ser: 1.09 mg/dL (ref 0.61–1.24)
GFR calc Af Amer: >60 mL/min (ref >60)
GFR calc non Af Amer: >60 mL/min (ref >60)
Glucose, Bld: 139 mg/dL — ABNORMAL HIGH (ref 70–99)
Potassium: 4 mmol/L (ref 3.5–5.1)
Sodium: 137 mmol/L (ref 135–145)
Total Bilirubin: 0.4 mg/dL (ref 0.3–1.2)
Total Protein: 8.6 g/dL — ABNORMAL HIGH (ref 6.5–8.1)

## 2018-09-11 LAB — CBC WITH DIFFERENTIAL/PLATELET
Abs Immature Granulocytes: 0.05 10*3/uL (ref 0.00–0.07)
Basophils Absolute: 0 10*3/uL (ref 0.0–0.1)
Basophils Relative: 1 %
Eosinophils Absolute: 0.2 10*3/uL (ref 0.0–0.5)
Eosinophils Relative: 3 %
HCT: 32.4 % — ABNORMAL LOW (ref 39.0–52.0)
Hemoglobin: 9.4 g/dL — ABNORMAL LOW (ref 13.0–17.0)
Immature Granulocytes: 1 %
Lymphocytes Relative: 16 %
Lymphs Abs: 1 10*3/uL (ref 0.7–4.0)
MCH: 27.5 pg (ref 26.0–34.0)
MCHC: 29 g/dL — ABNORMAL LOW (ref 30.0–36.0)
MCV: 94.7 fL (ref 80.0–100.0)
Monocytes Absolute: 0.6 10*3/uL (ref 0.1–1.0)
Monocytes Relative: 9 %
Neutro Abs: 4.5 10*3/uL (ref 1.7–7.7)
Neutrophils Relative %: 70 %
Platelets: 312 10*3/uL (ref 150–400)
RBC: 3.42 MIL/uL — ABNORMAL LOW (ref 4.22–5.81)
RDW: 18.8 % — ABNORMAL HIGH (ref 11.5–15.5)
WBC: 6.4 10*3/uL (ref 4.0–10.5)
nRBC: 0 % (ref 0.0–0.2)

## 2018-09-11 MED ORDER — HYDROCODONE-ACETAMINOPHEN 5-325 MG PO TABS: 1.0000 | ORAL_TABLET | Freq: Four times a day (QID) | ORAL | 0 refills | Status: DC | PRN

## 2018-09-11 MED ORDER — SODIUM CHLORIDE 0.9% FLUSH
10.0000 mL | Freq: Once | INTRAVENOUS | Status: DC
  Filled 2018-09-11: qty 10

## 2018-09-11 NOTE — Progress Notes (Signed)
[place]  Telephone:([phone] [phone]) [phone]   Name: Collin Gonzales Date: 09/11/2018 MRN: [id number]  DOB: [birth year]  Patient Care Team: [name], MD as PCP - General (Family Medicine) [name], MD as Referring Physician (Ophthalmology) [name], MD as Consulting Physician (Rheumatology) [name], MD as Referring Physician (Specialist) [name], MD as Consulting Physician (Urology) [name], MD as Referring Physician (Radiation Oncology)    REASON FOR CONSULTATION: Palliative Care consult requested for this 83 y.o. male with multiple medical problems including locally advanced high-grade urothelial carcinoma.  PMH also notable for O2 dependent COPD, PET positive lung mass status post SBRT, prostate cancer status post brachii therapy seed implant, history of CVA.  Patient is felt not to be a surgical candidate for his urothelial carcinoma and treatment options are felt to be somewhat limited based on frailty.  He was referred to palliative care to help address goals.   SOCIAL HISTORY:     reports that he quit smoking about 5 years ago. His smoking use included cigarettes. He has a 25.00 pack-year smoking history. He has never used smokeless tobacco. He reports that he does not drink alcohol or use drugs.   Patient is divorced.  He lives at home alone.  He has multiple children who live throughout the state.  Patient used to work in [employer].  ADVANCE DIRECTIVES:  Does not have  CODE STATUS: DNR (Order signed on 09/11/18)  PAST MEDICAL HISTORY: Past Medical History:  Diagnosis Date  . Arthritis   . Collagen vascular disease (HCC)    rheumatoid arthritis  . COPD (chronic obstructive pulmonary disease) ([place])   . GERD (gastroesophageal reflux disease)   . History of prostate cancer   . HLD (hyperlipidemia)   . HOH (hard of hearing)   . Insomnia   . Oxygen  deficit    2L HS AND PRN  . Primary cancer of right lung ([place]) 2017   rad tx's.  . Prostate cancer ([place]) 2006   Rad seed tx's.  . Stroke ([place])    2000  . Urothelial carcinoma of bladder ([place]) 10/2017   TURP to Prostate and chemo tx's.    PAST SURGICAL HISTORY:  Past Surgical History:  Procedure Laterality Date  . APPENDECTOMY  1965  . CATARACT EXTRACTION W/PHACO Right 01/05/2016   Procedure: CATARACT EXTRACTION PHACO AND INTRAOCULAR LENS PLACEMENT (IOC);  Surgeon: [name], MD;  Location: [facility];  Service: Ophthalmology;  Laterality: Right;  [place] 1.14 AP% 18.1 CDE 13.44 FLUID PACK LOT # [id number] H  . CYSTOSCOPY W/ RETROGRADES Bilateral 11/01/2017   Procedure: CYSTOSCOPY WITH RETROGRADE PYELOGRAM;  Surgeon: Hollice Espy, MD;  Location: [facility];  Service: Urology;  Laterality: Bilateral;  . ENDOBRONCHIAL ULTRASOUND N/A 02/26/2015   Procedure: ENDOBRONCHIAL ULTRASOUND;  Surgeon: Flora Lipps, MD;  Location: [facility];  Service: Cardiopulmonary;  Laterality: N/A;  . [place]  . INSERTION PROSTATE RADIATION SEED  2002  . PORTA CATH INSERTION N/A 05/14/2018   Procedure: PORTA CATH INSERTION;  Surgeon: Algernon Huxley, MD;  Location: [facility];  Service: Cardiovascular;  Laterality: N/A;  . TRANSURETHRAL RESECTION OF PROSTATE N/A 11/01/2017   Procedure: TRANSURETHRAL RESECTION OF THE PROSTATE (TURP) (channel TURP);  Surgeon: Hollice Espy, MD;  Location: [facility];  Service: Urology;  Laterality: N/A;    HEMATOLOGY/ONCOLOGY HISTORY:  Oncology History  Urothelial carcinoma of bladder ([place])  11/12/2017 Initial Diagnosis   Urothelial  carcinoma of bladder ([place])   11/23/2017 -  Chemotherapy   The patient had atezolizumab (TECENTRIQ) 1,200 mg in sodium chloride 0.9 % 250 mL chemo infusion, 1,200 mg, Intravenous, Once, 12 of 12 cycles Administration: 1,200 mg (11/23/2017), 1,200 mg (12/14/2017), 1,200 mg (01/04/2018), 1,200 mg (01/25/2018), 1,200 mg (03/22/2018), 1,200 mg  (02/28/2018), 1,200 mg (04/12/2018), 1,200 mg (05/30/2018), 1,200 mg (06/20/2018), 1,200 mg (07/11/2018), 1,200 mg (08/01/2018), 1,200 mg (08/22/2018)  for chemotherapy treatment.      ALLERGIES:  is allergic to plaquenil [hydroxychloroquine] and simvastatin.  MEDICATIONS:  Current Outpatient Medications  Medication Sig Dispense Refill  . Fluticasone-Umeclidin-Vilant (TRELEGY ELLIPTA) 100-62.5-25 MCG/INH AEPB Inhale 1 puff into the lungs daily. 60 each 5  . megestrol (MEGACE) 20 MG tablet Take 1 tablet (20 mg total) by mouth 2 (two) times daily. 180 tablet 1  . methotrexate (RHEUMATREX) 2.5 MG tablet Take 4 tablets by mouth once a week.     . OXYGEN Place 2 L into the nose daily as needed.     Marland Kitchen QUEtiapine (SEROQUEL) 50 MG tablet Take 1 tablet (50 mg total) by mouth at bedtime. 90 tablet 0  . folic acid (FOLVITE) 1 MG tablet Take 1 tablet (1 mg total) by mouth daily. (Patient not taking: Reported on 08/14/2018) 30 tablet 5   No current facility-administered medications for this visit.    Facility-Administered Medications Ordered in Other Visits  Medication Dose Route Frequency Provider Last Rate Last Dose  . sodium chloride flush (NS) 0.9 % injection 10 mL  10 mL Intravenous Once [name], Kathlene November, MD        VITAL SIGNS: BP (!) 104/51 (BP Location: Right Arm, Patient Position: Sitting)   Pulse 78   Temp (!) 96.9 F (36.1 C) (Tympanic)   Resp 20   SpO2 (!) 84% Comment: up to 99% on 2 L O2 Filed Weights    Estimated body mass index is 17.43 kg/m as calculated from the following:   Height as of 08/14/18: 5\' 6"  (1.676 m).   Weight as of 08/22/18: 108 lb (49 kg).  LABS: CBC:    Component Value Date/Time   WBC 6.4 09/11/2018 1058   HGB 9.4 (L) 09/11/2018 1058   HGB 12.3 (L) 11/21/2014 1010   HCT 32.4 (L) 09/11/2018 1058   HCT 39.3 11/21/2014 1010   PLT 312 09/11/2018 1058   PLT 387 (H) 11/21/2014 1010   MCV 94.7 09/11/2018 1058   MCV 95 11/21/2014 1010   MCV 95 01/05/2014 0429    NEUTROABS 4.5 09/11/2018 1058   NEUTROABS 3.2 11/21/2014 1010   NEUTROABS 5.3 01/05/2014 0429   LYMPHSABS 1.0 09/11/2018 1058   LYMPHSABS 1.7 11/21/2014 1010   LYMPHSABS 0.4 (L) 01/05/2014 0429   MONOABS 0.6 09/11/2018 1058   MONOABS 0.2 01/05/2014 0429   EOSABS 0.2 09/11/2018 1058   EOSABS 0.1 11/21/2014 1010   EOSABS 0.0 01/05/2014 0429   BASOSABS 0.0 09/11/2018 1058   BASOSABS 0.0 11/21/2014 1010   BASOSABS 0.0 01/05/2014 0429   Comprehensive Metabolic Panel:    Component Value Date/Time   NA 137 09/11/2018 1058   NA 139 12/08/2014 1004   NA 138 01/04/2014 0430   K 4.0 09/11/2018 1058   K 4.4 01/04/2014 0430   CL 101 09/11/2018 1058   CL 102 01/04/2014 0430   CO2 27 09/11/2018 1058   CO2 30 01/04/2014 0430   BUN 22 09/11/2018 1058   BUN 15 12/08/2014 1004   BUN 17 01/04/2014 0430  CREATININE 1.09 09/11/2018 1058   CREATININE 1.17 10/11/2017 1239   GLUCOSE 139 (H) 09/11/2018 1058   GLUCOSE 121 (H) 01/04/2014 0430   CALCIUM 8.7 (L) 09/11/2018 1058   CALCIUM 8.4 (L) 01/04/2014 0430   AST 15 09/11/2018 1058   ALT 7 09/11/2018 1058   ALKPHOS 62 09/11/2018 1058   BILITOT 0.4 09/11/2018 1058   BILITOT 0.3 12/08/2014 1004   PROT 8.6 (H) 09/11/2018 1058   PROT 8.6 (H) 12/08/2014 1004   ALBUMIN 2.4 (L) 09/11/2018 1058   ALBUMIN 3.7 12/08/2014 1004    RADIOGRAPHIC STUDIES: No results found.  PERFORMANCE STATUS (ECOG) : 1  Review of Systems As noted above. Otherwise, a complete review of systems is negative.  Physical Exam General: NAD, frail appearing, thin Pulmonary: unlabored Extremities: no edema Skin: no rashes Neurological: Weakness but otherwise nonfocal  IMPRESSION: Routine follow-up visit today with patient to discuss pain management.    Patient reports doing reasonably well without significant changes or concerns today.  He does endorse intermittent lower abdominal/penile pain.  Patient previously had good effect with treatment of PRN Norco but used  the last of his prescription several months ago.  Patient requested a refill.  He denies history of adverse effects.  He understands he should not drive on opioids.  We discussed constipation management.  Oral intake remains intermittently poor.  Weight has been stable.  He is drinking oral supplements once daily and we discussed increasing to twice daily.  Patient denies change in performance status.  He still lives at home alone and is able to provide for his own care.  His daughter is involved as needed.  We discussed advance care planning.  I again reviewed with him a MOST Form.  Patient did not want to complete without speaking with his daughter but did say emphatically that he would not want resuscitation nor machines to prolong his life.  Patient asked that I complete a DNR order for him to take home.  PDMP reviewed.   PLAN: -Continue current scope of treatment -Restart Norco 5-325mg  Q6H prn for pain (Rx #45) -Prophylactic bowel regimen -DNR signed -RTC in 1 month   Patient expressed understanding and was in agreement with this plan. He also understands that He can call clinic at any time with any questions, concerns, or complaints.     Time Total: 15 minutes  Visit consisted of counseling and education dealing with the complex and emotionally intense issues of symptom management and palliative care in the setting of serious and potentially life-threatening illness.Greater than 50%  of this time was spent counseling and coordinating care related to the above assessment and plan.  Signed by: Altha Harm, PhD, NP-C [phone] (Work Cell)

## 2018-09-11 NOTE — Progress Notes (Signed)
Patient here today for follow up with Collin Chang, NP in the palliative care clinic. Patient reports having a low appetite but does not take the Megace every day. He states that he is not taking folic acid daily, as he remembers that "someone told him he didn't need to take it". He denies having any pain at this time. He denies any nausea, vomiting, diarrhea or constipation. Patient's O2 sat was 84% on room air when checked; O2 was applied via nasal canula and went up to 99% on 2L/min. Patient stated that he left his oxygen in the car because he was afraid he might run out while he was in here.

## 2018-09-12 ENCOUNTER — Other Ambulatory Visit: Payer: Self-pay | Admitting: Oncology

## 2018-09-12 LAB — THYROID PANEL WITH TSH
Free Thyroxine Index: 1.8 (ref 1.2–4.9)
T3 Uptake Ratio: 28 % (ref 24–39)
T4, Total: 6.6 ug/dL (ref 4.5–12.0)
TSH: 1.57 u[IU]/mL (ref 0.450–4.500)

## 2018-09-13 ENCOUNTER — Encounter: Payer: Self-pay | Admitting: Oncology

## 2018-09-13 ENCOUNTER — Ambulatory Visit: Payer: Medicare Other | Admitting: Hospice and Palliative Medicine

## 2018-09-13 ENCOUNTER — Inpatient Hospital Stay (HOSPITAL_BASED_OUTPATIENT_CLINIC_OR_DEPARTMENT_OTHER): Payer: Medicare Other | Admitting: Oncology

## 2018-09-13 ENCOUNTER — Other Ambulatory Visit: Payer: Self-pay

## 2018-09-13 ENCOUNTER — Inpatient Hospital Stay: Payer: Medicare Other

## 2018-09-13 VITALS — BP 113/60 | HR 77 | Temp 97.1°F | Ht 66.0 in | Wt 116.0 lb

## 2018-09-13 DIAGNOSIS — D649 Anemia, unspecified: Secondary | ICD-10-CM | POA: Diagnosis not present

## 2018-09-13 DIAGNOSIS — G893 Neoplasm related pain (acute) (chronic): Secondary | ICD-10-CM | POA: Diagnosis not present

## 2018-09-13 DIAGNOSIS — Z8673 Personal history of transient ischemic attack (TIA), and cerebral infarction without residual deficits: Secondary | ICD-10-CM

## 2018-09-13 DIAGNOSIS — R918 Other nonspecific abnormal finding of lung field: Secondary | ICD-10-CM

## 2018-09-13 DIAGNOSIS — Z9981 Dependence on supplemental oxygen: Secondary | ICD-10-CM | POA: Diagnosis not present

## 2018-09-13 DIAGNOSIS — Z79899 Other long term (current) drug therapy: Secondary | ICD-10-CM | POA: Diagnosis not present

## 2018-09-13 DIAGNOSIS — C679 Malignant neoplasm of bladder, unspecified: Secondary | ICD-10-CM

## 2018-09-13 DIAGNOSIS — Z87891 Personal history of nicotine dependence: Secondary | ICD-10-CM

## 2018-09-13 DIAGNOSIS — Z5112 Encounter for antineoplastic immunotherapy: Secondary | ICD-10-CM | POA: Diagnosis not present

## 2018-09-13 DIAGNOSIS — J449 Chronic obstructive pulmonary disease, unspecified: Secondary | ICD-10-CM | POA: Diagnosis not present

## 2018-09-13 DIAGNOSIS — Z8546 Personal history of malignant neoplasm of prostate: Secondary | ICD-10-CM

## 2018-09-13 MED ORDER — SODIUM CHLORIDE 0.9% FLUSH
10.0000 mL | INTRAVENOUS | Status: DC | PRN
  Administered 2018-09-13: 10 mL via INTRAVENOUS
  Filled 2018-09-13: qty 10

## 2018-09-13 MED ORDER — SODIUM CHLORIDE 0.9 % IV SOLN
Freq: Once | INTRAVENOUS | Status: AC
  Administered 2018-09-13: 12:00:00 via INTRAVENOUS
  Filled 2018-09-13: qty 250

## 2018-09-13 MED ORDER — SODIUM CHLORIDE 0.9 % IV SOLN
1200.0000 mg | Freq: Once | INTRAVENOUS | Status: AC
  Administered 2018-09-13: 1200 mg via INTRAVENOUS
  Filled 2018-09-13: qty 20

## 2018-09-13 MED ORDER — HEPARIN SOD (PORK) LOCK FLUSH 100 UNIT/ML IV SOLN
500.0000 [IU] | Freq: Once | INTRAVENOUS | Status: AC
  Administered 2018-09-13: 500 [IU] via INTRAVENOUS

## 2018-09-13 NOTE — Progress Notes (Signed)
Patient stated that he had been doing well. Patient denied nausea, vomiting, diarrhea or constipation. Patient stated that his appetite had been doing well too.

## 2018-09-19 ENCOUNTER — Ambulatory Visit
Admission: RE | Admit: 2018-09-19 | Discharge: 2018-09-19 | Disposition: A | Discharge status: Home / Self Care | Payer: Medicare Other | Source: Ambulatory Visit | Attending: Oncology | Admitting: Oncology

## 2018-09-19 ENCOUNTER — Other Ambulatory Visit: Payer: Self-pay

## 2018-09-19 ENCOUNTER — Other Ambulatory Visit: Payer: Self-pay | Admitting: Family Medicine

## 2018-09-19 DIAGNOSIS — C679 Malignant neoplasm of bladder, unspecified: Secondary | ICD-10-CM | POA: Diagnosis not present

## 2018-09-19 DIAGNOSIS — J9811 Atelectasis: Secondary | ICD-10-CM | POA: Diagnosis not present

## 2018-09-19 DIAGNOSIS — R918 Other nonspecific abnormal finding of lung field: Secondary | ICD-10-CM | POA: Diagnosis not present

## 2018-09-19 DIAGNOSIS — J439 Emphysema, unspecified: Secondary | ICD-10-CM | POA: Diagnosis not present

## 2018-09-19 DIAGNOSIS — N2889 Other specified disorders of kidney and ureter: Secondary | ICD-10-CM | POA: Diagnosis not present

## 2018-09-19 MED ORDER — IOHEXOL 300 MG/ML  SOLN
75.0000 mL | Freq: Once | INTRAMUSCULAR | Status: AC | PRN
  Administered 2018-09-19: 75 mL via INTRAVENOUS

## 2018-09-19 NOTE — Telephone Encounter (Signed)
Refill request for general medication. Seroquel   Last office visit 07/20/2018   Follow up on 10/22/2018

## 2018-09-20 ENCOUNTER — Inpatient Hospital Stay: Payer: Medicare Other

## 2018-09-20 NOTE — Progress Notes (Signed)
Nutrition Follow-up:  Patient with high grade urothelial carcinoma.  Patient receiving tecentriq.    Spoke with patient via phone this am.  Patient reports that appetite is better.  "I think that medicine is helping me."  Patient taking megace.  Reports that he is taking 1-2 ensure per day.  Likes his sausage biscuits with jelly times 2 from [employer] for breakfast mid am then may drink an ensure at 3pm and then has meat and vegetables for dinner (last night ribs, lima beans and cantelope and hushpuppies.  Sometimes snacks on ice cream sandwiches.      Medications: megace  Labs: glucose 139  Anthropometrics:   Weight is 116 lb on 7/23 increased from 108 lb on 7/1  NUTRITION DIAGNOSIS: Inadequate oral intake improving   INTERVENTION:  Encouraged patient to continue to eat high calorie, high protein foods Encouraged ensure enlive at least BID     MONITORING, EVALUATION, GOAL: Patient will consume adequate calories and protein to prevent further weight loss   NEXT VISIT: Thursday, August 13 during infusion  Joan Herschberger [name], [place], [place] Registered Dietitian [phone] (pager)

## 2018-09-28 ENCOUNTER — Other Ambulatory Visit: Payer: Self-pay | Admitting: Family Medicine

## 2018-09-28 DIAGNOSIS — E440 Moderate protein-calorie malnutrition: Secondary | ICD-10-CM

## 2018-09-28 DIAGNOSIS — G4709 Other insomnia: Secondary | ICD-10-CM

## 2018-09-29 NOTE — Progress Notes (Signed)
[place]  Telephone:(336) [phone] [phone]) [phone]  ID: Collin Gonzales OB: 09/30/1935  MR#: [id number]  ATF#:[id number]  Patient Care Team: [name], MD as PCP - General (Family Medicine) [name], MD as Referring Physician (Ophthalmology) [name], MD as Consulting Physician (Rheumatology) [name], MD as Referring Physician (Specialist) [name], MD as Consulting Physician (Urology) [name], MD as Referring Physician (Radiation Oncology)  CHIEF COMPLAINT: Locally advanced high-grade urothelial carcinoma.    INTERVAL HISTORY: Patient returns to clinic today for further evaluation, discussion of his imaging results, and consideration of cycle 14 of Tecentriq.  He continues to have incontinence, but otherwise feels well.  He has no neurologic complaints.  He denies any recent fevers or illnesses.  He has chronic shortness of breath, but denies any chest pain, hemoptysis, or cough.  He has no nausea, vomiting, constipation, or diarrhea.  He denies any hematuria.  Patient offers no further specific complaints today.  REVIEW OF SYSTEMS:   Review of Systems  Constitutional: Negative.  Negative for fever, malaise/fatigue and weight loss.  Respiratory: Positive for shortness of breath. Negative for cough and hemoptysis.   Cardiovascular: Negative.  Negative for chest pain and leg swelling.  Gastrointestinal: Negative.  Negative for abdominal pain, blood in stool and melena.  Genitourinary: Positive for frequency and urgency. Negative for dysuria and hematuria.  Musculoskeletal: Negative.  Negative for back pain.  Skin: Negative.  Negative for rash.  Neurological: Negative.  Negative for sensory change, focal weakness, weakness and headaches.  Psychiatric/Behavioral: Negative.  The patient is not nervous/anxious.     As per HPI. Otherwise, a complete review of systems is negative.  PAST MEDICAL HISTORY:  Past Medical History:  Diagnosis Date  . Arthritis   . Collagen vascular disease (HCC)    rheumatoid arthritis  . COPD (chronic obstructive pulmonary disease) ([place])   . GERD (gastroesophageal reflux disease)   . History of prostate cancer   . HLD (hyperlipidemia)   . HOH (hard of hearing)   . Insomnia   . Oxygen deficit    2L HS AND PRN  . Primary cancer of right lung ([place]) 2017   rad tx's.  . Prostate cancer ([place]) 2006   Rad seed tx's.  . Stroke ([place])    2000  . Urothelial carcinoma of bladder ([place]) 10/2017   TURP to Prostate and chemo tx's.    PAST SURGICAL HISTORY: Past Surgical History:  Procedure Laterality Date  . APPENDECTOMY  1965  . CATARACT EXTRACTION W/PHACO Right 01/05/2016   Procedure: CATARACT EXTRACTION PHACO AND INTRAOCULAR LENS PLACEMENT (IOC);  Surgeon: [name], MD;  Location: [facility];  Service: Ophthalmology;  Laterality: Right;  [place] 1.14 AP% 18.1 CDE 13.44 FLUID PACK LOT # [id number]  . CYSTOSCOPY W/ RETROGRADES Bilateral 11/01/2017   Procedure: CYSTOSCOPY WITH RETROGRADE PYELOGRAM;  Surgeon: Hollice Espy, MD;  Location: [facility];  Service: Urology;  Laterality: Bilateral;  . ENDOBRONCHIAL ULTRASOUND N/A 02/26/2015   Procedure: ENDOBRONCHIAL ULTRASOUND;  Surgeon: Flora Lipps, MD;  Location: [facility];  Service: Cardiopulmonary;  Laterality: N/A;  . [place]  . INSERTION PROSTATE RADIATION SEED  2002  . PORTA CATH INSERTION N/A 05/14/2018   Procedure: PORTA CATH INSERTION;  Surgeon: Algernon Huxley, MD;  Location: [place] [facility];  Service: Cardiovascular;  Laterality: N/A;  . TRANSURETHRAL RESECTION OF PROSTATE N/A 11/01/2017   Procedure: TRANSURETHRAL RESECTION OF THE PROSTATE (TURP) (channel TURP);  Surgeon: Hollice Espy, MD;  Location: [facility];  Service: Urology;  Laterality: N/A;    FAMILY HISTORY: Family History  Problem Relation Age of Onset  . Cancer Mother   . Cancer Father        Lung Cancer  . Cancer Sister         breast  . Cancer Brother        stomach    ADVANCED DIRECTIVES (Y/N):  N  HEALTH MAINTENANCE: Social History   Tobacco Use  . Smoking status: Former Smoker    Packs/day: 0.50    Years: 50.00    Pack years: 25.00    Types: Cigarettes    Quit date: 02/21/2013    Years since quitting: 5.6  . Smokeless tobacco: Never Used  . Tobacco comment: smoking cessation materials not required  Substance Use Topics  . Alcohol use: No    Alcohol/week: 0.0 standard drinks  . Drug use: No     Colonoscopy:  PAP:  Bone density:  Lipid panel:  Allergies  Allergen Reactions  . Plaquenil [Hydroxychloroquine]     Bad dreams  . Simvastatin Other (See Comments)    Bad dreams    Current Outpatient Medications  Medication Sig Dispense Refill  . Fluticasone-Umeclidin-Vilant (TRELEGY ELLIPTA) 100-62.5-25 MCG/INH AEPB Inhale 1 puff into the lungs daily. 60 each 5  . folic acid (FOLVITE) 1 MG tablet Take 1 tablet (1 mg total) by mouth daily. 30 tablet 5  . HYDROcodone-acetaminophen (NORCO) 5-325 MG tablet Take 1 tablet by mouth every 6 (six) hours as needed for moderate pain. 45 tablet 0  . megestrol (MEGACE) 20 MG tablet Take 1 tablet (20 mg total) by mouth 2 (two) times daily. 180 tablet 1  . methotrexate (RHEUMATREX) 2.5 MG tablet Take 4 tablets by mouth once a week.     . OXYGEN Place 2 L into the nose daily as needed.     Marland Kitchen QUEtiapine (SEROQUEL) 50 MG tablet Take 1 tablet (50 mg total) by mouth at bedtime. 90 tablet 0   No current facility-administered medications for this visit.     OBJECTIVE: Vitals:   10/04/18 0958  BP: 118/64  Pulse: 74  Resp: 18  SpO2: 100%     Body mass index is 18.72 kg/m.    ECOG FS:0 - Asymptomatic  General: Well-developed, well-nourished, no acute distress. Eyes: Pink conjunctiva, anicteric sclera. HEENT: Normocephalic, moist mucous membranes. Lungs: Clear to auscultation bilaterally. Heart: Regular rate and rhythm. No rubs, murmurs, or gallops.  Abdomen: Soft, nontender, nondistended. No organomegaly noted, normoactive bowel sounds. Musculoskeletal: No edema, cyanosis, or clubbing. Neuro: Alert, answering all questions appropriately. Cranial nerves grossly intact. Skin: No rashes or petechiae noted. Psych: Normal affect.  LAB RESULTS:  Lab Results  Component Value Date   NA 140 10/02/2018   K 4.3 10/02/2018   CL 106 10/02/2018   CO2 25 10/02/2018   GLUCOSE 132 (H) 10/02/2018   BUN 24 (H) 10/02/2018   CREATININE 1.11 10/02/2018   CALCIUM 9.0 10/02/2018   PROT 8.8 (H) 10/02/2018   ALBUMIN 2.6 (L) 10/02/2018   AST 15 10/02/2018   ALT 8 10/02/2018   ALKPHOS 67 10/02/2018   BILITOT 0.4 10/02/2018   GFRNONAA >60 10/02/2018   GFRAA >60 10/02/2018    Lab Results  Component Value Date   WBC 13.8 (H) 10/02/2018   NEUTROABS 11.2 (H) 10/02/2018   HGB 9.9 (L) 10/02/2018   HCT 33.1 (L) 10/02/2018   MCV 94.0 10/02/2018   PLT 256 10/02/2018  STUDIES: Ct Chest W Contrast  Result Date: 09/19/2018 CLINICAL DATA:  83 year old male with history of bladder cancer status post TURP and chemotherapy. EXAM: CT CHEST, ABDOMEN, AND PELVIS WITH CONTRAST TECHNIQUE: Multidetector CT imaging of the chest, abdomen and pelvis was performed following the standard protocol during bolus administration of intravenous contrast. CONTRAST:  25mL OMNIPAQUE IOHEXOL 300 MG/ML  SOLN COMPARISON:  CT the chest, abdomen and pelvis 05/25/2018. FINDINGS: CT CHEST FINDINGS Cardiovascular: Heart size is enlarged with right ventricular dilatation. Small amount of pericardial fluid and/or thickening, unlikely to be of hemodynamic significance at this time. No pericardial calcification. There is aortic atherosclerosis, as well as atherosclerosis of the great vessels of the mediastinum and the coronary arteries, including calcified atherosclerotic plaque in the left main, left anterior descending, left circumflex and right coronary arteries. Right internal jugular  single-lumen porta cath with tip terminating in the distal superior vena cava. Mediastinum/Nodes: No pathologically enlarged mediastinal or hilar lymph nodes. Esophagus is unremarkable in appearance. No axillary lymphadenopathy. Lungs/Pleura: Diffuse bronchial wall thickening with severe centrilobular and paraseptal emphysema again noted. Chronic architectural distortion and volume loss in the inferior aspect of the right upper lobe where there is a shrinking nodular opacity which currently measures 2.9 x 2.0 cm (axial image 123 of series 3), significantly smaller than prior examination, most compatible with an area of chronic atelectasis and scarring. Multiple other small 2-4 mm pulmonary nodules scattered throughout the lungs bilaterally, unchanged in size, number and distribution compared to the prior study. No larger more suspicious appearing pulmonary nodules or masses. No acute consolidative airspace disease. No pleural effusions. Musculoskeletal: There are no aggressive appearing lytic or blastic lesions noted in the visualized portions of the skeleton. CT ABDOMEN PELVIS FINDINGS Hepatobiliary: No suspicious cystic or solid hepatic lesions. No intra or extrahepatic biliary ductal dilatation. Small calcified granuloma in the right lobe of the liver inferiorly. Gallbladder is normal in appearance. Pancreas: No pancreatic mass. No pancreatic ductal dilatation. No pancreatic or peripancreatic fluid collections or inflammatory changes. Spleen: Unremarkable. Adrenals/Urinary Tract: Subcentimeter low-attenuation lesion in the upper pole of the left kidney, too small to characterize, but similar to the prior study and statistically likely a cyst. Right kidney and bilateral adrenal glands are normal in appearance. No hydroureteronephrosis. Urinary bladder is nearly completely decompressed and demonstrates severe wall thickening. There is a small amount of gas non dependently in the urinary bladder. In addition, there  is a gas-filled cavity inferior to the urinary bladder, presumably within the prostate gland which is contiguous with a serpiginous collection of gas which extends to the distal aspect of the penis, most compatible with gas within the penile urethra. On sagittal images there appears to be a fistulous connection between the distal rectum (at or immediately adjacent to the anorectal junction) and the prostate gland best appreciated on sagittal image 81 of series 6. Stomach/Bowel: Normal appearance of the stomach. No pathologic dilatation of small bowel or colon. Mural thickening in the rectum best appreciated on axial image 111 of series 2. Possible communication between the distal rectum at the level of the anorectal junction and the adjacent cavitary area in the prostate gland best appreciated on sagittal image 81 of series 6 and axial image 112 of series 2. Vascular/Lymphatic: Aortic atherosclerosis, without evidence of aneurysm or dissection in the abdominal or pelvic vasculature. No lymphadenopathy identified in the abdomen or pelvis. Reproductive: Cavitary area in the prostate gland (as discussed above). Gas throughout the penile urethra. Fiducial markers adjacent to the  prostate gland. Seminal vesicles are unremarkable in appearance. Bilateral hydroceles. Other: No significant volume of ascites.  No pneumoperitoneum. Musculoskeletal: There are no aggressive appearing lytic or blastic lesions noted in the visualized portions of the skeleton. IMPRESSION: 1. Large gas containing collection within the prostate gland, with gas extending cephalad into the urinary bladder as well as throughout the penile urethra. In addition, there is imaging evidence suggestive of a rectoprostatic fistula originating at or immediately adjacent to the anorectal junction, as detailed above. 2. No definite findings to suggest metastatic disease in the abdomen or pelvis. 3. Small pulmonary nodules scattered throughout the lungs  bilaterally, similar to the prior examination, favored to be benign. Contracting area of atelectasis and scarring in the inferior aspect of the right upper lobe, as above. 4. Cardiomegaly with right ventricular dilatation. 5. Small amount of pericardial fluid and/or thickening, unlikely to be of any hemodynamic significance at this time. 6. Aortic atherosclerosis, in addition to left main and 3 vessel coronary artery disease. 7. Additional incidental findings, as above. Electronically Signed   By: Vinnie Langton M.D.   On: 09/19/2018 16:25   Ct Abdomen Pelvis W Contrast  Result Date: 09/19/2018 CLINICAL DATA:  83 year old male with history of bladder cancer status post TURP and chemotherapy. EXAM: CT CHEST, ABDOMEN, AND PELVIS WITH CONTRAST TECHNIQUE: Multidetector CT imaging of the chest, abdomen and pelvis was performed following the standard protocol during bolus administration of intravenous contrast. CONTRAST:  17mL OMNIPAQUE IOHEXOL 300 MG/ML  SOLN COMPARISON:  CT the chest, abdomen and pelvis 05/25/2018. FINDINGS: CT CHEST FINDINGS Cardiovascular: Heart size is enlarged with right ventricular dilatation. Small amount of pericardial fluid and/or thickening, unlikely to be of hemodynamic significance at this time. No pericardial calcification. There is aortic atherosclerosis, as well as atherosclerosis of the great vessels of the mediastinum and the coronary arteries, including calcified atherosclerotic plaque in the left main, left anterior descending, left circumflex and right coronary arteries. Right internal jugular single-lumen porta cath with tip terminating in the distal superior vena cava. Mediastinum/Nodes: No pathologically enlarged mediastinal or hilar lymph nodes. Esophagus is unremarkable in appearance. No axillary lymphadenopathy. Lungs/Pleura: Diffuse bronchial wall thickening with severe centrilobular and paraseptal emphysema again noted. Chronic architectural distortion and volume loss in  the inferior aspect of the right upper lobe where there is a shrinking nodular opacity which currently measures 2.9 x 2.0 cm (axial image 123 of series 3), significantly smaller than prior examination, most compatible with an area of chronic atelectasis and scarring. Multiple other small 2-4 mm pulmonary nodules scattered throughout the lungs bilaterally, unchanged in size, number and distribution compared to the prior study. No larger more suspicious appearing pulmonary nodules or masses. No acute consolidative airspace disease. No pleural effusions. Musculoskeletal: There are no aggressive appearing lytic or blastic lesions noted in the visualized portions of the skeleton. CT ABDOMEN PELVIS FINDINGS Hepatobiliary: No suspicious cystic or solid hepatic lesions. No intra or extrahepatic biliary ductal dilatation. Small calcified granuloma in the right lobe of the liver inferiorly. Gallbladder is normal in appearance. Pancreas: No pancreatic mass. No pancreatic ductal dilatation. No pancreatic or peripancreatic fluid collections or inflammatory changes. Spleen: Unremarkable. Adrenals/Urinary Tract: Subcentimeter low-attenuation lesion in the upper pole of the left kidney, too small to characterize, but similar to the prior study and statistically likely a cyst. Right kidney and bilateral adrenal glands are normal in appearance. No hydroureteronephrosis. Urinary bladder is nearly completely decompressed and demonstrates severe wall thickening. There is a small amount of gas  non dependently in the urinary bladder. In addition, there is a gas-filled cavity inferior to the urinary bladder, presumably within the prostate gland which is contiguous with a serpiginous collection of gas which extends to the distal aspect of the penis, most compatible with gas within the penile urethra. On sagittal images there appears to be a fistulous connection between the distal rectum (at or immediately adjacent to the anorectal junction)  and the prostate gland best appreciated on sagittal image 81 of series 6. Stomach/Bowel: Normal appearance of the stomach. No pathologic dilatation of small bowel or colon. Mural thickening in the rectum best appreciated on axial image 111 of series 2. Possible communication between the distal rectum at the level of the anorectal junction and the adjacent cavitary area in the prostate gland best appreciated on sagittal image 81 of series 6 and axial image 112 of series 2. Vascular/Lymphatic: Aortic atherosclerosis, without evidence of aneurysm or dissection in the abdominal or pelvic vasculature. No lymphadenopathy identified in the abdomen or pelvis. Reproductive: Cavitary area in the prostate gland (as discussed above). Gas throughout the penile urethra. Fiducial markers adjacent to the prostate gland. Seminal vesicles are unremarkable in appearance. Bilateral hydroceles. Other: No significant volume of ascites.  No pneumoperitoneum. Musculoskeletal: There are no aggressive appearing lytic or blastic lesions noted in the visualized portions of the skeleton. IMPRESSION: 1. Large gas containing collection within the prostate gland, with gas extending cephalad into the urinary bladder as well as throughout the penile urethra. In addition, there is imaging evidence suggestive of a rectoprostatic fistula originating at or immediately adjacent to the anorectal junction, as detailed above. 2. No definite findings to suggest metastatic disease in the abdomen or pelvis. 3. Small pulmonary nodules scattered throughout the lungs bilaterally, similar to the prior examination, favored to be benign. Contracting area of atelectasis and scarring in the inferior aspect of the right upper lobe, as above. 4. Cardiomegaly with right ventricular dilatation. 5. Small amount of pericardial fluid and/or thickening, unlikely to be of any hemodynamic significance at this time. 6. Aortic atherosclerosis, in addition to left main and 3  vessel coronary artery disease. 7. Additional incidental findings, as above. Electronically Signed   By: Vinnie Langton M.D.   On: 09/19/2018 16:25    ASSESSMENT: Locally advanced high-grade urothelial carcinoma.  PLAN:    1. Locally advanced high-grade urothelial carcinoma: Case discussed with urology, radiation oncology, as well as multidisciplinary tumor board.  Patient is not a surgical candidate nor can he receive additional XRT given his history of prostate cancer and brachii therapy seed placement.  Though he has a decent performance status, patient is frail and likely would not tolerate cisplatin based therapy.  CT scan results from September 19, 2018 reviewed independently and reported as above with no obvious evidence of recurrent or progressive disease outside patient's bladder.  Continue palliative treatment with Tecentriq.  Previously, hospice and end-of-life were discussed but patient is not interested at this time.  Despite evidence of fistula, patient is not a surgical candidate therefore will continue to monitor with routine CT scans.  Proceed with cycle 14 of Tecentriq today.  Return to clinic in 3 weeks for further evaluation and consideration of cycle 15.   2.  Right upper lobe lung mass: Previously, although biopsy was negative, patient had a positive PET scan that was highly suspicious for underlying malignancy. He underwent SBRT in approximately May 2017.   3.  Shortness of breath: Chronic and unchanged.  Patient now requires oxygen  24 hours/day although is admittedly noncompliant..  4.  Anemia: Chronic and unchanged.  Hemoglobin is slowly trending up and is now 9.9. 5.  Hematuria: Patient does not complain of this today.  Appreciate urology input. 6.  Renal insufficiency: Resolved. 7.  Leukocytosis: Possibly reactive, monitor.   Patient expressed understanding and was in agreement with this plan. He also understands that He can call clinic at any time with any questions,  concerns, or complaints.    Lloyd Huger, MD   10/04/2018 7:00 PM

## 2018-09-30 MED ORDER — QUETIAPINE FUMARATE 50 MG PO TABS: 50.0000 mg | ORAL_TABLET | Freq: Every day | ORAL | 0 refills | Status: DC

## 2018-10-01 ENCOUNTER — Other Ambulatory Visit: Payer: Self-pay

## 2018-10-02 ENCOUNTER — Other Ambulatory Visit: Payer: Self-pay

## 2018-10-02 ENCOUNTER — Inpatient Hospital Stay: Payer: Medicare Other | Attending: Oncology

## 2018-10-02 DIAGNOSIS — Z8546 Personal history of malignant neoplasm of prostate: Secondary | ICD-10-CM | POA: Insufficient documentation

## 2018-10-02 DIAGNOSIS — Z87891 Personal history of nicotine dependence: Secondary | ICD-10-CM | POA: Diagnosis not present

## 2018-10-02 DIAGNOSIS — D649 Anemia, unspecified: Secondary | ICD-10-CM | POA: Diagnosis not present

## 2018-10-02 DIAGNOSIS — J449 Chronic obstructive pulmonary disease, unspecified: Secondary | ICD-10-CM | POA: Diagnosis not present

## 2018-10-02 DIAGNOSIS — Z9119 Patient's noncompliance with other medical treatment and regimen: Secondary | ICD-10-CM | POA: Diagnosis not present

## 2018-10-02 DIAGNOSIS — Z9981 Dependence on supplemental oxygen: Secondary | ICD-10-CM | POA: Diagnosis not present

## 2018-10-02 DIAGNOSIS — D72829 Elevated white blood cell count, unspecified: Secondary | ICD-10-CM | POA: Diagnosis not present

## 2018-10-02 DIAGNOSIS — C679 Malignant neoplasm of bladder, unspecified: Secondary | ICD-10-CM | POA: Insufficient documentation

## 2018-10-02 DIAGNOSIS — Z79899 Other long term (current) drug therapy: Secondary | ICD-10-CM | POA: Insufficient documentation

## 2018-10-02 DIAGNOSIS — G4700 Insomnia, unspecified: Secondary | ICD-10-CM | POA: Insufficient documentation

## 2018-10-02 DIAGNOSIS — E785 Hyperlipidemia, unspecified: Secondary | ICD-10-CM | POA: Diagnosis not present

## 2018-10-02 DIAGNOSIS — Z5112 Encounter for antineoplastic immunotherapy: Secondary | ICD-10-CM | POA: Diagnosis not present

## 2018-10-02 DIAGNOSIS — M069 Rheumatoid arthritis, unspecified: Secondary | ICD-10-CM | POA: Diagnosis not present

## 2018-10-02 DIAGNOSIS — Z8673 Personal history of transient ischemic attack (TIA), and cerebral infarction without residual deficits: Secondary | ICD-10-CM | POA: Insufficient documentation

## 2018-10-02 LAB — CBC WITH DIFFERENTIAL/PLATELET
Abs Immature Granulocytes: 0.05 10*3/uL (ref 0.00–0.07)
Basophils Absolute: 0 10*3/uL (ref 0.0–0.1)
Basophils Relative: 0 %
Eosinophils Absolute: 0.2 10*3/uL (ref 0.0–0.5)
Eosinophils Relative: 2 %
HCT: 33.1 % — ABNORMAL LOW (ref 39.0–52.0)
Hemoglobin: 9.9 g/dL — ABNORMAL LOW (ref 13.0–17.0)
Immature Granulocytes: 0 %
Lymphocytes Relative: 10 %
Lymphs Abs: 1.4 10*3/uL (ref 0.7–4.0)
MCH: 28.1 pg (ref 26.0–34.0)
MCHC: 29.9 g/dL — ABNORMAL LOW (ref 30.0–36.0)
MCV: 94 fL (ref 80.0–100.0)
Monocytes Absolute: 0.8 10*3/uL (ref 0.1–1.0)
Monocytes Relative: 6 %
Neutro Abs: 11.2 10*3/uL — ABNORMAL HIGH (ref 1.7–7.7)
Neutrophils Relative %: 82 %
Platelets: 256 10*3/uL (ref 150–400)
RBC: 3.52 MIL/uL — ABNORMAL LOW (ref 4.22–5.81)
RDW: 18.6 % — ABNORMAL HIGH (ref 11.5–15.5)
WBC: 13.8 10*3/uL — ABNORMAL HIGH (ref 4.0–10.5)
nRBC: 0 % (ref 0.0–0.2)

## 2018-10-02 LAB — COMPREHENSIVE METABOLIC PANEL
ALT: 8 U/L (ref 0–44)
AST: 15 U/L (ref 15–41)
Albumin: 2.6 g/dL — ABNORMAL LOW (ref 3.5–5.0)
Alkaline Phosphatase: 67 U/L (ref 38–126)
Anion gap: 9 (ref 5–15)
BUN: 24 mg/dL — ABNORMAL HIGH (ref 8–23)
CO2: 25 mmol/L (ref 22–32)
Calcium: 9 mg/dL (ref 8.9–10.3)
Chloride: 106 mmol/L (ref 98–111)
Creatinine, Ser: 1.11 mg/dL (ref 0.61–1.24)
GFR calc Af Amer: >60 mL/min (ref >60)
GFR calc non Af Amer: >60 mL/min (ref >60)
Glucose, Bld: 132 mg/dL — ABNORMAL HIGH (ref 70–99)
Potassium: 4.3 mmol/L (ref 3.5–5.1)
Sodium: 140 mmol/L (ref 135–145)
Total Bilirubin: 0.4 mg/dL (ref 0.3–1.2)
Total Protein: 8.8 g/dL — ABNORMAL HIGH (ref 6.5–8.1)

## 2018-10-03 ENCOUNTER — Other Ambulatory Visit: Payer: Self-pay

## 2018-10-03 LAB — THYROID PANEL WITH TSH
Free Thyroxine Index: 2.2 (ref 1.2–4.9)
T3 Uptake Ratio: 30 % (ref 24–39)
T4, Total: 7.2 ug/dL (ref 4.5–12.0)
TSH: 1.8 u[IU]/mL (ref 0.450–4.500)

## 2018-10-04 ENCOUNTER — Inpatient Hospital Stay: Payer: Medicare Other

## 2018-10-04 ENCOUNTER — Inpatient Hospital Stay (HOSPITAL_BASED_OUTPATIENT_CLINIC_OR_DEPARTMENT_OTHER): Payer: Medicare Other | Admitting: Oncology

## 2018-10-04 ENCOUNTER — Encounter: Payer: Self-pay | Admitting: Oncology

## 2018-10-04 ENCOUNTER — Other Ambulatory Visit: Payer: Self-pay

## 2018-10-04 VITALS — BP 118/64 | HR 74 | Resp 18 | Wt 116.0 lb

## 2018-10-04 VITALS — Temp 97.8°F

## 2018-10-04 DIAGNOSIS — Z9119 Patient's noncompliance with other medical treatment and regimen: Secondary | ICD-10-CM | POA: Diagnosis not present

## 2018-10-04 DIAGNOSIS — M069 Rheumatoid arthritis, unspecified: Secondary | ICD-10-CM | POA: Diagnosis not present

## 2018-10-04 DIAGNOSIS — Z9981 Dependence on supplemental oxygen: Secondary | ICD-10-CM | POA: Diagnosis not present

## 2018-10-04 DIAGNOSIS — Z5112 Encounter for antineoplastic immunotherapy: Secondary | ICD-10-CM | POA: Diagnosis not present

## 2018-10-04 DIAGNOSIS — D72829 Elevated white blood cell count, unspecified: Secondary | ICD-10-CM | POA: Diagnosis not present

## 2018-10-04 DIAGNOSIS — D649 Anemia, unspecified: Secondary | ICD-10-CM | POA: Diagnosis not present

## 2018-10-04 DIAGNOSIS — C679 Malignant neoplasm of bladder, unspecified: Secondary | ICD-10-CM | POA: Diagnosis not present

## 2018-10-04 DIAGNOSIS — Z8673 Personal history of transient ischemic attack (TIA), and cerebral infarction without residual deficits: Secondary | ICD-10-CM | POA: Diagnosis not present

## 2018-10-04 DIAGNOSIS — J449 Chronic obstructive pulmonary disease, unspecified: Secondary | ICD-10-CM | POA: Diagnosis not present

## 2018-10-04 DIAGNOSIS — Z87891 Personal history of nicotine dependence: Secondary | ICD-10-CM | POA: Diagnosis not present

## 2018-10-04 DIAGNOSIS — E785 Hyperlipidemia, unspecified: Secondary | ICD-10-CM | POA: Diagnosis not present

## 2018-10-04 DIAGNOSIS — G4700 Insomnia, unspecified: Secondary | ICD-10-CM | POA: Diagnosis not present

## 2018-10-04 DIAGNOSIS — Z79899 Other long term (current) drug therapy: Secondary | ICD-10-CM | POA: Diagnosis not present

## 2018-10-04 MED ORDER — SODIUM CHLORIDE 0.9% FLUSH
10.0000 mL | INTRAVENOUS | Status: DC | PRN
  Administered 2018-10-04: 10 mL
  Filled 2018-10-04: qty 10

## 2018-10-04 MED ORDER — HEPARIN SOD (PORK) LOCK FLUSH 100 UNIT/ML IV SOLN
500.0000 [IU] | Freq: Once | INTRAVENOUS | Status: AC | PRN
  Administered 2018-10-04: 12:00:00 500 [IU]
  Filled 2018-10-04: qty 5

## 2018-10-04 MED ORDER — SODIUM CHLORIDE 0.9 % IV SOLN
1200.0000 mg | Freq: Once | INTRAVENOUS | Status: AC
  Administered 2018-10-04: 1200 mg via INTRAVENOUS
  Filled 2018-10-04: qty 20

## 2018-10-04 MED ORDER — SODIUM CHLORIDE 0.9 % IV SOLN
Freq: Once | INTRAVENOUS | Status: AC
  Administered 2018-10-04: 11:00:00 via INTRAVENOUS
  Filled 2018-10-04: qty 250

## 2018-10-04 NOTE — Progress Notes (Signed)
Nutrition Follow-up:  Patient with high grade urothelial carcinoma.  Patient receiving tecentriq.    Spoke with patient during infusion.  Patient reports that appetite is better but he still needs to try and eat more.  Reports that he is drinking 2 ensure per day.  Usually has a [employer] biscuit (sausage) for breakfast. Around 3pm tries to drink ensure and sometimes may eat a sandwich (chicken salad).  Reports eat meat and vegetables for supper.  Last night ate fried chicken, green beans, potato salad and corn.      Medications: reviewed  Labs: reviewed  Anthropometrics:   Weight stable at 116 lb but increased from 108 lb on 7/1.     NUTRITION DIAGNOSIS: Inadequate oral intake improving   INTERVENTION:  Discussed ways to add calories and protein to current eating pattern. Provide another case of ensure enlive to patient along with coupons.      MONITORING, EVALUATION, GOAL: Patient will consume adequate calories and protein to prevent further weight loss   NEXT VISIT: Sept 3 during infusion  Collin Gonzales, [place], [place] Registered Dietitian [phone] (pager)

## 2018-10-04 NOTE — Progress Notes (Signed)
Patient denies any concerns today.  

## 2018-10-07 DIAGNOSIS — J449 Chronic obstructive pulmonary disease, unspecified: Secondary | ICD-10-CM | POA: Diagnosis not present

## 2018-10-20 NOTE — Progress Notes (Signed)
Collin Gonzales  [phone] [phone]) [phone]  ID: Collin Gonzales OB: Oct 31, 1935  MR#: [id number]  UKG#:[id number]  Patient Care Team: [name], MD as PCP - General (Family Medicine) [name], MD as Referring Physician (Ophthalmology) [name], MD as Consulting Physician (Rheumatology) [name], MD as Referring Physician (Specialist) [name], MD as Consulting Physician (Urology) [name], MD as Referring Physician (Radiation Oncology)  CHIEF COMPLAINT: Locally advanced high-grade urothelial carcinoma.    INTERVAL HISTORY: Patient returns to clinic today for further evaluation and consideration of cycle 15 of Tecentriq.  Collin Gonzales is complaining of pelvic pain today.  Upon further questioning, patient states this is relatively unchanged from previous.  Collin Gonzales states his hydrocodone only works for several hours before wearing off.  Collin Gonzales has no neurologic complaints.  Collin Gonzales denies any recent fevers or illnesses.  Collin Gonzales has chronic shortness of breath, but denies any chest pain, hemoptysis, or cough.  Collin Gonzales has no nausea, vomiting, constipation, or diarrhea.  Collin Gonzales denies any hematuria.  Patient offers no further specific complaints today.  REVIEW OF SYSTEMS:   Review of Systems  Constitutional: Negative.  Negative for fever, malaise/fatigue and weight loss.  Respiratory: Positive for shortness of breath. Negative for cough and hemoptysis.   Cardiovascular: Negative.  Negative for chest pain and leg swelling.  Gastrointestinal: Negative.  Negative for abdominal pain, blood in stool and melena.  Genitourinary: Positive for frequency and urgency. Negative for dysuria and hematuria.       Pelvic pain  Musculoskeletal: Negative.  Negative for back pain.  Skin: Negative.  Negative for rash.  Neurological: Negative.  Negative for sensory change, focal weakness, weakness and headaches.  Psychiatric/Behavioral: Negative.  The patient  is not nervous/anxious.     As per HPI. Otherwise, a complete review of systems is negative.  PAST MEDICAL HISTORY: Past Medical History:  Diagnosis Date  . Arthritis   . Collagen vascular disease (HCC)    rheumatoid arthritis  . COPD (chronic obstructive pulmonary disease) ([place])   . GERD (gastroesophageal reflux disease)   . History of prostate cancer   . HLD (hyperlipidemia)   . HOH (hard of hearing)   . Insomnia   . Oxygen deficit    2L HS AND PRN  . Primary cancer of right lung ([place]) 2017   rad tx's.  . Prostate cancer ([place]) 2006   Rad seed tx's.  . Stroke ([place])    2000  . Urothelial carcinoma of bladder ([place]) 10/2017   TURP to Prostate and chemo tx's.    PAST SURGICAL HISTORY: Past Surgical History:  Procedure Laterality Date  . APPENDECTOMY  1965  . CATARACT EXTRACTION W/PHACO Right 01/05/2016   Procedure: CATARACT EXTRACTION PHACO AND INTRAOCULAR LENS PLACEMENT (IOC);  Surgeon: [name], MD;  Location: [facility];  Service: Ophthalmology;  Laterality: Right;  [place] 1.14 AP% 18.1 CDE 13.44 FLUID PACK LOT # [id number] H  . CYSTOSCOPY W/ RETROGRADES Bilateral 11/01/2017   Procedure: CYSTOSCOPY WITH RETROGRADE PYELOGRAM;  Surgeon: Hollice Espy, MD;  Location: [facility];  Service: Urology;  Laterality: Bilateral;  . ENDOBRONCHIAL ULTRASOUND N/A 02/26/2015   Procedure: ENDOBRONCHIAL ULTRASOUND;  Surgeon: Flora Lipps, MD;  Location: [facility];  Service: Cardiopulmonary;  Laterality: N/A;  . [place]  . INSERTION PROSTATE RADIATION SEED  2002  . PORTA CATH INSERTION N/A 05/14/2018   Procedure: PORTA CATH INSERTION;  Surgeon: Algernon Huxley, MD;  Location: [place] [facility];  Service: Cardiovascular;  Laterality: N/A;  . TRANSURETHRAL RESECTION OF PROSTATE N/A 11/01/2017   Procedure: TRANSURETHRAL RESECTION OF THE PROSTATE (TURP) (channel TURP);  Surgeon: Hollice Espy, MD;  Location: [facility];  Service: Urology;  Laterality: N/A;    FAMILY HISTORY: Family  History  Problem Relation Age of Onset  . Cancer Mother   . Cancer Father        Lung Cancer  . Cancer Sister        breast  . Cancer Brother        stomach    ADVANCED DIRECTIVES (Y/N):  N  HEALTH MAINTENANCE: Social History   Tobacco Use  . Smoking status: Former Smoker    Packs/day: 0.50    Years: 50.00    Pack years: 25.00    Types: Cigarettes    Quit date: 02/21/2013    Years since quitting: 5.6  . Smokeless tobacco: Never Used  . Tobacco comment: smoking cessation materials not required  Substance Use Topics  . Alcohol use: No    Alcohol/week: 0.0 standard drinks  . Drug use: No     Colonoscopy:  PAP:  Bone density:  Lipid panel:  Allergies  Allergen Reactions  . Plaquenil [Hydroxychloroquine]     Bad dreams  . Simvastatin Other (See Comments)    Bad dreams    Current Outpatient Medications  Medication Sig Dispense Refill  . Fluticasone-Umeclidin-Vilant (TRELEGY ELLIPTA) 100-62.5-25 MCG/INH AEPB Inhale 1 puff into the lungs daily. 60 each 5  . HYDROcodone-acetaminophen (NORCO) 5-325 MG tablet Take 1 tablet by mouth every 6 (six) hours as needed for moderate pain. 45 tablet 0  . megestrol (MEGACE) 20 MG tablet Take 1 tablet (20 mg total) by mouth 2 (two) times daily. 180 tablet 1  . methotrexate (RHEUMATREX) 2.5 MG tablet Take 4 tablets by mouth once a week.     . OXYGEN Place 2 L into the nose daily as needed.     Marland Kitchen QUEtiapine (SEROQUEL) 50 MG tablet Take 1 tablet (50 mg total) by mouth at bedtime. 90 tablet 0  . Oxycodone HCl 10 MG TABS Take 1 tablet (10 mg total) by mouth every 4 (four) hours as needed. 60 tablet 0   No current facility-administered medications for this visit.    Facility-Administered Medications Ordered in Other Visits  Medication Dose Route Frequency Provider Last Rate Last Dose  . atezolizumab (TECENTRIQ) 1,200 mg in sodium chloride 0.9 % 250 mL chemo infusion  1,200 mg Intravenous Once Lloyd Huger, MD   Stopped at 10/25/18  1139  . heparin lock flush 100 unit/mL  500 Units Intracatheter Once PRN Lloyd Huger, MD        OBJECTIVE: Vitals:   10/25/18 1005  BP: (!) 94/53  Pulse: 85  Resp: 18  Temp: (!) 96.8 F (36 C)     Body mass index is 18.72 kg/m.    ECOG FS:0 - Asymptomatic  General: Well-developed, well-nourished, no acute distress. Eyes: Pink conjunctiva, anicteric sclera. HEENT: Normocephalic, moist mucous membranes. Lungs: Clear to auscultation bilaterally. Heart: Regular rate and rhythm. No rubs, murmurs, or gallops. Abdomen: Soft, nontender, nondistended. No organomegaly noted, normoactive bowel sounds. Musculoskeletal: No edema, cyanosis, or clubbing. Neuro: Alert, answering all questions appropriately. Cranial nerves grossly intact. Skin: No rashes or petechiae noted. Psych: Normal affect.  LAB RESULTS:  Lab Results  Component Value Date   NA 139 10/24/2018   K 4.8 10/24/2018   CL 106 10/24/2018   CO2 25 10/24/2018   GLUCOSE 127 (  H) 10/24/2018   BUN 28 (H) 10/24/2018   CREATININE 1.15 10/24/2018   CALCIUM 9.0 10/24/2018   PROT 8.6 (H) 10/24/2018   ALBUMIN 2.6 (L) 10/24/2018   AST 18 10/24/2018   ALT 9 10/24/2018   ALKPHOS 61 10/24/2018   BILITOT 0.4 10/24/2018   GFRNONAA 60 (L) 10/24/2018   GFRAA >60 10/24/2018    Lab Results  Component Value Date   WBC 7.1 10/24/2018   NEUTROABS 5.3 10/24/2018   HGB 9.7 (L) 10/24/2018   HCT 32.1 (L) 10/24/2018   MCV 93.3 10/24/2018   PLT 311 10/24/2018     STUDIES: No results found.  ASSESSMENT: Locally advanced high-grade urothelial carcinoma.  PLAN:    1. Locally advanced high-grade urothelial carcinoma: Case discussed with urology, radiation oncology, as well as multidisciplinary tumor board.  Patient is not a surgical candidate nor can Collin Gonzales receive additional XRT given his history of prostate cancer and brachii therapy seed placement.  Though Collin Gonzales has a decent performance status, patient is frail and likely would not  tolerate cisplatin based therapy.  CT scan results from September 19, 2018 reviewed independently with no obvious evidence of recurrent or progressive disease outside patient's bladder. Previously, hospice and end-of-life were discussed but patient is not interested at this time.  Despite evidence of fistula, patient is not a surgical candidate therefore will continue to monitor with routine CT scans.  Proceed with cycle 15 of Tecentriq today.  Return to clinic in 3 weeks for further evaluation and consideration of cycle 16.  Will reimage approximately the end of October. 2.  Right upper lobe lung mass: Previously, although biopsy was negative, patient had a positive PET scan that was highly suspicious for underlying malignancy. Collin Gonzales underwent SBRT in approximately May 2017.   3.  Shortness of breath: Chronic and unchanged.  Patient now requires oxygen 24 hours/day although is admittedly noncompliant. 4.  Anemia: Chronic and unchanged.  Hemoglobin is decreased, but stable at 9.7. 5.  Hematuria: Patient does not complain of this today.  Appreciate urology input. 6.  Renal insufficiency: Resolved. 7.  Leukocytosis: Resolved. 8.  Pain: Patient was given a prescription for oxycodone 10 mg every 4 hours as needed today.   Patient expressed understanding and was in agreement with this plan. Collin Gonzales also understands that Collin Gonzales can call clinic at any time with any questions, concerns, or complaints.    Lloyd Huger, MD   10/25/2018 11:32 AM

## 2018-10-22 ENCOUNTER — Encounter: Payer: Self-pay | Admitting: Family Medicine

## 2018-10-22 ENCOUNTER — Ambulatory Visit (INDEPENDENT_AMBULATORY_CARE_PROVIDER_SITE_OTHER): Payer: Medicare Other | Admitting: Family Medicine

## 2018-10-22 ENCOUNTER — Other Ambulatory Visit: Payer: Self-pay

## 2018-10-22 VITALS — Temp 97.5°F | Wt 116.0 lb

## 2018-10-22 DIAGNOSIS — E440 Moderate protein-calorie malnutrition: Secondary | ICD-10-CM

## 2018-10-22 DIAGNOSIS — I69354 Hemiplegia and hemiparesis following cerebral infarction affecting left non-dominant side: Secondary | ICD-10-CM | POA: Diagnosis not present

## 2018-10-22 DIAGNOSIS — E7800 Pure hypercholesterolemia, unspecified: Secondary | ICD-10-CM

## 2018-10-22 DIAGNOSIS — IMO0002 Reserved for concepts with insufficient information to code with codable children: Secondary | ICD-10-CM

## 2018-10-22 DIAGNOSIS — J449 Chronic obstructive pulmonary disease, unspecified: Secondary | ICD-10-CM | POA: Diagnosis not present

## 2018-10-22 DIAGNOSIS — M069 Rheumatoid arthritis, unspecified: Secondary | ICD-10-CM

## 2018-10-22 DIAGNOSIS — G4709 Other insomnia: Secondary | ICD-10-CM | POA: Diagnosis not present

## 2018-10-22 DIAGNOSIS — I700 Atherosclerosis of aorta: Secondary | ICD-10-CM

## 2018-10-22 DIAGNOSIS — I4892 Unspecified atrial flutter: Secondary | ICD-10-CM

## 2018-10-22 DIAGNOSIS — I4891 Unspecified atrial fibrillation: Secondary | ICD-10-CM | POA: Diagnosis not present

## 2018-10-22 DIAGNOSIS — C675 Malignant neoplasm of bladder neck: Secondary | ICD-10-CM

## 2018-10-22 MED ORDER — TRELEGY ELLIPTA 100-62.5-25 MCG/INH IN AEPB: 1.0000 | INHALATION_SPRAY | Freq: Every day | RESPIRATORY_TRACT | 5 refills | Status: AC

## 2018-10-22 MED ORDER — QUETIAPINE FUMARATE 50 MG PO TABS: 50.0000 mg | ORAL_TABLET | Freq: Every day | ORAL | 0 refills | Status: AC

## 2018-10-22 NOTE — Progress Notes (Signed)
Name: Collin Gonzales   MRN: [id number]    DOB: April 12, 1935   Date:10/22/2018       Progress Note  Subjective  Chief Complaint  Chief Complaint  Patient presents with  . Medication Refill  . COPD    SOB occasionally with coughing spells  . Insomnia    Sleeps on average 5 hours nightly  . Left side weakness history of CVA  . Atherosclerosis of aorta  . High grade urothelial carcinoma    I connected with  Collin Gonzales on 10/22/18 at  1:40 PM EDT by telephone and verified that I am speaking with the correct person using two identifiers.  I discussed the limitations, risks, security and privacy concerns of performing an evaluation and management service by telephone and the availability of in person appointments. Staff also discussed with the patient that there may be a patient responsible charge related to this service. Patient Location: at home Provider Location: [place]   HPI   Chronic insomnia:he failed Trazodone, Remeron, and also Ambien 5 mg,he states seroquel helped him the best but is currently taking 50 mg at night,  however still takes a long time to fall asleep. He states going to bed around 11 pm and takes hours to fall asleep, he has been sleeping for about 5-6 hours and asked to go up on dose of seroquel but explained the lower the dose the better. Refill sent to pharmacy today   COPD/emphysema:  He has a chronic  cough and SOB, he is oxygen dependent2 liters.Hewas treated for right lung cancer  by Dr. Baruch Gonzales with radiationand is only being monitored now. He is using Trelegy. He is stable   Atherosclerosis of aorta: not on statin, he is 83 years old and multiple co morbidities , under palliative care and intolerant to statin therapy   RA: sees Dr. Jefm Gonzales twice a year, denies joint pains or swelling. He has not been taking folic acid and explained importance of resuming supplementation  AFib: rate controlled, sob is stable, not  on blood thinners, he is under  palliative care, also has  anemia - last Hgb was 9.9 two weeks ago  High grade urothelial carcinoma: recently seen by Dr. Grayland Gonzales and not a candidate for surgery or additional XRT secondary to history of prostate cancer and brachii therapy seed placement, he is under palliative treatment with Tecentriq , cycle 14 started recently   Left side weakness history of CVA: stable, still able to walk on his own , not  on statin therapy or aspirin ( intolerance and also anemia)   Aortic Atherosclerosis: not on statin therapy or aspirin because of contraindications.     Patient Active Problem List   Diagnosis Date Noted  . Aortic atherosclerosis ([place]) 07/20/2018  . Thrombocytopenia, unspecified ([place]) 07/20/2018  . Urothelial carcinoma ([place]) 07/20/2018  . Urothelial carcinoma of bladder ([place]) 11/12/2017  . Urinary retention 11/01/2017  . Dependence on supplemental oxygen 04/24/2017  . Malnutrition ([place]) 04/24/2017  . Hemiparesis affecting left side as late effect of cerebrovascular accident (CVA) ([place]) 04/24/2017  . Cataract 09/06/2016  . Drug-induced folate deficiency anemia 09/06/2016  . Esophagitis, reflux 09/06/2016  . Iron deficiency anemia due to chronic blood loss 08/23/2016  . Decrease in appetite 09/01/2015  . GERD (gastroesophageal reflux disease) 08/05/2015  . COPD (chronic obstructive pulmonary disease) ([place]) 08/20/2014  . Dyslipidemia 08/20/2014  . Chronic insomnia 08/20/2014  . Arthritis or polyarthritis, rheumatoid ([place]) 08/20/2014  . History of prostate  cancer 11/01/2013  . Flutter-fibrillation ([place]) 03/18/2005    Past Surgical History:  Procedure Laterality Date  . APPENDECTOMY  1965  . CATARACT EXTRACTION W/PHACO Right 01/05/2016   Procedure: CATARACT EXTRACTION PHACO AND INTRAOCULAR LENS PLACEMENT (IOC);  Surgeon: [name], MD;  Location: [facility];  Service: Ophthalmology;  Laterality: Right;  [place] 1.14 AP% 18.1 CDE 13.44  FLUID PACK LOT # [id number] H  . CYSTOSCOPY W/ RETROGRADES Bilateral 11/01/2017   Procedure: CYSTOSCOPY WITH RETROGRADE PYELOGRAM;  Surgeon: Hollice Espy, MD;  Location: [facility];  Service: Urology;  Laterality: Bilateral;  . ENDOBRONCHIAL ULTRASOUND N/A 02/26/2015   Procedure: ENDOBRONCHIAL ULTRASOUND;  Surgeon: Flora Lipps, MD;  Location: [facility];  Service: Cardiopulmonary;  Laterality: N/A;  . [place]  . INSERTION PROSTATE RADIATION SEED  2002  . PORTA CATH INSERTION N/A 05/14/2018   Procedure: PORTA CATH INSERTION;  Surgeon: Algernon Huxley, MD;  Location: [place] [facility];  Service: Cardiovascular;  Laterality: N/A;  . TRANSURETHRAL RESECTION OF PROSTATE N/A 11/01/2017   Procedure: TRANSURETHRAL RESECTION OF THE PROSTATE (TURP) (channel TURP);  Surgeon: Hollice Espy, MD;  Location: [facility];  Service: Urology;  Laterality: N/A;    Family History  Problem Relation Age of Onset  . Cancer Mother   . Cancer Father        Lung Cancer  . Cancer Sister        breast  . Cancer Brother        stomach    Social History   Socioeconomic History  . Marital status: Divorced    Spouse name: Not on file  . Number of children: 5  . Years of education: Not on file  . Highest education level: 12th grade  Occupational History  . Occupation: Retired  Scientific laboratory technician  . Financial resource strain: Not hard at all  . Food insecurity    Worry: Never true    Inability: Never true  . Transportation needs    Medical: No    Non-medical: No  Tobacco Use  . Smoking status: Former Smoker    Packs/day: 0.50    Years: 50.00    Pack years: 25.00    Types: Cigarettes    Quit date: 02/21/2013    Years since quitting: 5.6  . Smokeless tobacco: Never Used  . Tobacco comment: smoking cessation materials not required  Substance and Sexual Activity  . Alcohol use: No    Alcohol/week: 0.0 standard drinks  . Drug use: No  . Sexual activity: Not Currently  Lifestyle  . Physical activity     Days per week: 0 days    Minutes per session: 0 min  . Stress: Not at all  Relationships  . Social connections    Talks on phone: More than three times a week    Gets together: More than three times a week    Attends religious service: More than 4 times per year    Active member of club or organization: No    Attends meetings of clubs or organizations: Never    Relationship status: Divorced  . Intimate partner violence    Fear of current or ex partner: No    Emotionally abused: No    Physically abused: No    Forced sexual activity: No  Other Topics Concern  . Not on file  Social History Narrative  . Not on file     Current Outpatient Medications:  .  Fluticasone-Umeclidin-Vilant (TRELEGY ELLIPTA) 100-62.5-25 MCG/INH AEPB, Inhale 1 puff  into the lungs daily., Disp: 60 each, Rfl: 5 .  HYDROcodone-acetaminophen (NORCO) 5-325 MG tablet, Take 1 tablet by mouth every 6 (six) hours as needed for moderate pain., Disp: 45 tablet, Rfl: 0 .  megestrol (MEGACE) 20 MG tablet, Take 1 tablet (20 mg total) by mouth 2 (two) times daily., Disp: 180 tablet, Rfl: 1 .  methotrexate (RHEUMATREX) 2.5 MG tablet, Take 4 tablets by mouth once a week. , Disp: , Rfl:  .  OXYGEN, Place 2 L into the nose daily as needed. , Disp: , Rfl:  .  QUEtiapine (SEROQUEL) 50 MG tablet, Take 1 tablet (50 mg total) by mouth at bedtime., Disp: 90 tablet, Rfl: 0 .  folic acid (FOLVITE) 1 MG tablet, Take 1 tablet (1 mg total) by mouth daily. (Patient not taking: Reported on 10/22/2018), Disp: 30 tablet, Rfl: 5  Allergies  Allergen Reactions  . Plaquenil [Hydroxychloroquine]     Bad dreams  . Simvastatin Other (See Comments)    Bad dreams    I personally reviewed active problem list, medication list, allergies, family history, social history with the patient/caregiver today.   ROS  Ten systems reviewed and is negative except as mentioned in HPI   Objective  Virtual encounter, vitals done at home  Vitals:    10/22/18 1336  Temp: (!) 97.5 F (36.4 C)     There is no height or weight on file to calculate BMI.  Physical Exam  Awake, alert and cooperative  PHQ2/9: Depression screen [facility] 2/9 10/22/2018 08/14/2018 07/20/2018 11/24/2017 10/11/2017  Decreased Interest 0 0 0 0 0  Down, Depressed, Hopeless 0 0 0 0 0  PHQ - 2 Score 0 0 0 0 0  Altered sleeping 0 - 0 3 0  Tired, decreased energy 0 - 0 3 0  Change in appetite 0 - 0 1 0  Feeling bad or failure about yourself  0 - 0 0 0  Trouble concentrating 0 - 0 0 0  Moving slowly or fidgety/restless 0 - 0 0 0  Suicidal thoughts 0 - 0 0 0  PHQ-9 Score 0 - 0 7 0  Difficult doing work/chores Not difficult at all - - Not difficult at all Not difficult at all  Some recent data might be hidden   PHQ-2/9 Result is negative.    Fall Risk: Fall Risk  10/22/2018 09/13/2018 08/14/2018 07/20/2018 03/30/2018  Falls in the past year? 0 0 0 0 0  Number falls in past yr: 0 - 0 0 -  Injury with Fall? 0 - 0 0 -  Risk for fall due to : - - - - -  Risk for fall due to: Comment - - - - -    Assessment & Plan  1. Chronic obstructive pulmonary disease, unspecified COPD type ([place])  - Fluticasone-Umeclidin-Vilant (TRELEGY ELLIPTA) 100-62.5-25 MCG/INH AEPB; Inhale 1 puff into the lungs daily.  Dispense: 60 each; Refill: 5  2. Other insomnia  - QUEtiapine (SEROQUEL) 50 MG tablet; Take 1 tablet (50 mg total) by mouth at bedtime.  Dispense: 90 tablet; Refill: 0  3. Aortic atherosclerosis (HCC)  Not on statin since multiple co-morbidities   4. Hemiparesis affecting left side as late effect of cerebrovascular accident (CVA) ([place])  Still able to walk without a cane, but walks slowly   5. Atrial fibrillation/flutter (HCC)  No palpitation   6. Rheumatoid arthritis of wrist, unspecified laterality, unspecified rheumatoid factor presence (HCC)  Sees Dr. Jefm Gonzales twice a year   7. Cancer,  bladder, neck ([place])  Under palliative care Dr. Grayland Gonzales   8. Moderate  protein-calorie malnutrition ([place])  Continue high protein diet, gaining some weight   9. Pure hypercholesterolemia  intolerance to statin   I discussed the assessment and treatment plan with the patient. The patient was provided an opportunity to ask questions and all were answered. The patient agreed with the plan and demonstrated an understanding of the instructions.   The patient was advised to call back or seek an in-person evaluation if the symptoms worsen or if the condition fails to improve as anticipated.  I provided 25 minutes of non-face-to-face time during this encounter.  Loistine Chance, MD

## 2018-10-23 ENCOUNTER — Other Ambulatory Visit: Payer: Self-pay

## 2018-10-24 ENCOUNTER — Other Ambulatory Visit: Payer: Self-pay

## 2018-10-24 ENCOUNTER — Encounter: Payer: Self-pay | Admitting: Oncology

## 2018-10-24 ENCOUNTER — Inpatient Hospital Stay: Payer: Medicare Other | Attending: Oncology

## 2018-10-24 DIAGNOSIS — Z85118 Personal history of other malignant neoplasm of bronchus and lung: Secondary | ICD-10-CM | POA: Diagnosis not present

## 2018-10-24 DIAGNOSIS — Z8673 Personal history of transient ischemic attack (TIA), and cerebral infarction without residual deficits: Secondary | ICD-10-CM | POA: Insufficient documentation

## 2018-10-24 DIAGNOSIS — M2518 Fistula, other specified site: Secondary | ICD-10-CM | POA: Diagnosis not present

## 2018-10-24 DIAGNOSIS — I69398 Other sequelae of cerebral infarction: Secondary | ICD-10-CM | POA: Diagnosis not present

## 2018-10-24 DIAGNOSIS — Z801 Family history of malignant neoplasm of trachea, bronchus and lung: Secondary | ICD-10-CM | POA: Diagnosis not present

## 2018-10-24 DIAGNOSIS — E785 Hyperlipidemia, unspecified: Secondary | ICD-10-CM | POA: Insufficient documentation

## 2018-10-24 DIAGNOSIS — Z9079 Acquired absence of other genital organ(s): Secondary | ICD-10-CM | POA: Insufficient documentation

## 2018-10-24 DIAGNOSIS — M2550 Pain in unspecified joint: Secondary | ICD-10-CM | POA: Diagnosis not present

## 2018-10-24 DIAGNOSIS — Z8546 Personal history of malignant neoplasm of prostate: Secondary | ICD-10-CM | POA: Insufficient documentation

## 2018-10-24 DIAGNOSIS — Z5112 Encounter for antineoplastic immunotherapy: Secondary | ICD-10-CM | POA: Insufficient documentation

## 2018-10-24 DIAGNOSIS — Z923 Personal history of irradiation: Secondary | ICD-10-CM | POA: Diagnosis not present

## 2018-10-24 DIAGNOSIS — R55 Syncope and collapse: Secondary | ICD-10-CM | POA: Diagnosis not present

## 2018-10-24 DIAGNOSIS — J441 Chronic obstructive pulmonary disease with (acute) exacerbation: Secondary | ICD-10-CM | POA: Diagnosis not present

## 2018-10-24 DIAGNOSIS — E860 Dehydration: Secondary | ICD-10-CM | POA: Diagnosis not present

## 2018-10-24 DIAGNOSIS — Z7401 Bed confinement status: Secondary | ICD-10-CM | POA: Diagnosis not present

## 2018-10-24 DIAGNOSIS — J432 Centrilobular emphysema: Secondary | ICD-10-CM | POA: Diagnosis not present

## 2018-10-24 DIAGNOSIS — I69354 Hemiplegia and hemiparesis following cerebral infarction affecting left non-dominant side: Secondary | ICD-10-CM | POA: Diagnosis not present

## 2018-10-24 DIAGNOSIS — R339 Retention of urine, unspecified: Secondary | ICD-10-CM | POA: Diagnosis not present

## 2018-10-24 DIAGNOSIS — N189 Chronic kidney disease, unspecified: Secondary | ICD-10-CM | POA: Diagnosis not present

## 2018-10-24 DIAGNOSIS — R531 Weakness: Secondary | ICD-10-CM | POA: Diagnosis not present

## 2018-10-24 DIAGNOSIS — M069 Rheumatoid arthritis, unspecified: Secondary | ICD-10-CM | POA: Insufficient documentation

## 2018-10-24 DIAGNOSIS — C679 Malignant neoplasm of bladder, unspecified: Secondary | ICD-10-CM | POA: Insufficient documentation

## 2018-10-24 DIAGNOSIS — N390 Urinary tract infection, site not specified: Secondary | ICD-10-CM | POA: Diagnosis not present

## 2018-10-24 DIAGNOSIS — D649 Anemia, unspecified: Secondary | ICD-10-CM | POA: Insufficient documentation

## 2018-10-24 DIAGNOSIS — R5381 Other malaise: Secondary | ICD-10-CM | POA: Diagnosis not present

## 2018-10-24 DIAGNOSIS — R0902 Hypoxemia: Secondary | ICD-10-CM | POA: Diagnosis not present

## 2018-10-24 DIAGNOSIS — R262 Difficulty in walking, not elsewhere classified: Secondary | ICD-10-CM | POA: Diagnosis not present

## 2018-10-24 DIAGNOSIS — Z87891 Personal history of nicotine dependence: Secondary | ICD-10-CM | POA: Diagnosis not present

## 2018-10-24 DIAGNOSIS — J449 Chronic obstructive pulmonary disease, unspecified: Secondary | ICD-10-CM | POA: Diagnosis not present

## 2018-10-24 DIAGNOSIS — R1312 Dysphagia, oropharyngeal phase: Secondary | ICD-10-CM | POA: Diagnosis not present

## 2018-10-24 DIAGNOSIS — F5101 Primary insomnia: Secondary | ICD-10-CM | POA: Diagnosis not present

## 2018-10-24 DIAGNOSIS — Z9981 Dependence on supplemental oxygen: Secondary | ICD-10-CM | POA: Diagnosis not present

## 2018-10-24 DIAGNOSIS — M6281 Muscle weakness (generalized): Secondary | ICD-10-CM | POA: Diagnosis not present

## 2018-10-24 DIAGNOSIS — I69954 Hemiplegia and hemiparesis following unspecified cerebrovascular disease affecting left non-dominant side: Secondary | ICD-10-CM | POA: Diagnosis not present

## 2018-10-24 DIAGNOSIS — M0580 Other rheumatoid arthritis with rheumatoid factor of unspecified site: Secondary | ICD-10-CM | POA: Diagnosis not present

## 2018-10-24 DIAGNOSIS — Z79899 Other long term (current) drug therapy: Secondary | ICD-10-CM | POA: Insufficient documentation

## 2018-10-24 DIAGNOSIS — J9611 Chronic respiratory failure with hypoxia: Secondary | ICD-10-CM | POA: Diagnosis not present

## 2018-10-24 DIAGNOSIS — Z66 Do not resuscitate: Secondary | ICD-10-CM | POA: Diagnosis not present

## 2018-10-24 DIAGNOSIS — J9811 Atelectasis: Secondary | ICD-10-CM | POA: Diagnosis not present

## 2018-10-24 DIAGNOSIS — Z888 Allergy status to other drugs, medicaments and biological substances status: Secondary | ICD-10-CM | POA: Diagnosis not present

## 2018-10-24 DIAGNOSIS — K219 Gastro-esophageal reflux disease without esophagitis: Secondary | ICD-10-CM | POA: Diagnosis not present

## 2018-10-24 DIAGNOSIS — M6282 Rhabdomyolysis: Secondary | ICD-10-CM | POA: Diagnosis not present

## 2018-10-24 DIAGNOSIS — N3001 Acute cystitis with hematuria: Secondary | ICD-10-CM | POA: Diagnosis not present

## 2018-10-24 DIAGNOSIS — W19XXXA Unspecified fall, initial encounter: Secondary | ICD-10-CM | POA: Diagnosis not present

## 2018-10-24 DIAGNOSIS — R498 Other voice and resonance disorders: Secondary | ICD-10-CM | POA: Diagnosis not present

## 2018-10-24 DIAGNOSIS — E46 Unspecified protein-calorie malnutrition: Secondary | ICD-10-CM | POA: Diagnosis not present

## 2018-10-24 LAB — CBC WITH DIFFERENTIAL/PLATELET
Abs Immature Granulocytes: 0.04 10*3/uL (ref 0.00–0.07)
Basophils Absolute: 0 10*3/uL (ref 0.0–0.1)
Basophils Relative: 0 %
Eosinophils Absolute: 0.2 10*3/uL (ref 0.0–0.5)
Eosinophils Relative: 2 %
HCT: 32.1 % — ABNORMAL LOW (ref 39.0–52.0)
Hemoglobin: 9.7 g/dL — ABNORMAL LOW (ref 13.0–17.0)
Immature Granulocytes: 1 %
Lymphocytes Relative: 13 %
Lymphs Abs: 0.9 10*3/uL (ref 0.7–4.0)
MCH: 28.2 pg (ref 26.0–34.0)
MCHC: 30.2 g/dL (ref 30.0–36.0)
MCV: 93.3 fL (ref 80.0–100.0)
Monocytes Absolute: 0.6 10*3/uL (ref 0.1–1.0)
Monocytes Relative: 9 %
Neutro Abs: 5.3 10*3/uL (ref 1.7–7.7)
Neutrophils Relative %: 75 %
Platelets: 311 10*3/uL (ref 150–400)
RBC: 3.44 MIL/uL — ABNORMAL LOW (ref 4.22–5.81)
RDW: 17.7 % — ABNORMAL HIGH (ref 11.5–15.5)
WBC: 7.1 10*3/uL (ref 4.0–10.5)
nRBC: 0 % (ref 0.0–0.2)

## 2018-10-24 LAB — COMPREHENSIVE METABOLIC PANEL
ALT: 9 U/L (ref 0–44)
AST: 18 U/L (ref 15–41)
Albumin: 2.6 g/dL — ABNORMAL LOW (ref 3.5–5.0)
Alkaline Phosphatase: 61 U/L (ref 38–126)
Anion gap: 8 (ref 5–15)
BUN: 28 mg/dL — ABNORMAL HIGH (ref 8–23)
CO2: 25 mmol/L (ref 22–32)
Calcium: 9 mg/dL (ref 8.9–10.3)
Chloride: 106 mmol/L (ref 98–111)
Creatinine, Ser: 1.15 mg/dL (ref 0.61–1.24)
GFR calc Af Amer: >60 mL/min (ref >60)
GFR calc non Af Amer: 60 mL/min — ABNORMAL LOW (ref >60)
Glucose, Bld: 127 mg/dL — ABNORMAL HIGH (ref 70–99)
Potassium: 4.8 mmol/L (ref 3.5–5.1)
Sodium: 139 mmol/L (ref 135–145)
Total Bilirubin: 0.4 mg/dL (ref 0.3–1.2)
Total Protein: 8.6 g/dL — ABNORMAL HIGH (ref 6.5–8.1)

## 2018-10-24 NOTE — Progress Notes (Signed)
Patient stated stated that he had been doing well. However, there are times that he has pain and would like a refill on his Hydrocodone-Acetaminophen.

## 2018-10-25 ENCOUNTER — Other Ambulatory Visit: Payer: Self-pay | Admitting: *Deleted

## 2018-10-25 ENCOUNTER — Other Ambulatory Visit: Payer: Self-pay

## 2018-10-25 ENCOUNTER — Inpatient Hospital Stay (HOSPITAL_BASED_OUTPATIENT_CLINIC_OR_DEPARTMENT_OTHER): Payer: Medicare Other | Admitting: Oncology

## 2018-10-25 ENCOUNTER — Inpatient Hospital Stay: Payer: Medicare Other

## 2018-10-25 VITALS — BP 94/53 | HR 85 | Temp 96.8°F | Resp 18 | Wt 116.0 lb

## 2018-10-25 DIAGNOSIS — C679 Malignant neoplasm of bladder, unspecified: Secondary | ICD-10-CM

## 2018-10-25 LAB — THYROID PANEL WITH TSH
Free Thyroxine Index: 2 (ref 1.2–4.9)
T3 Uptake Ratio: 31 % (ref 24–39)
T4, Total: 6.3 ug/dL (ref 4.5–12.0)
TSH: 1.2 u[IU]/mL (ref 0.450–4.500)

## 2018-10-25 MED ORDER — HEPARIN SOD (PORK) LOCK FLUSH 100 UNIT/ML IV SOLN
500.0000 [IU] | Freq: Once | INTRAVENOUS | Status: AC | PRN
  Administered 2018-10-25: 12:00:00 500 [IU]

## 2018-10-25 MED ORDER — SODIUM CHLORIDE 0.9 % IV SOLN
Freq: Once | INTRAVENOUS | Status: AC
  Administered 2018-10-25: 11:00:00 via INTRAVENOUS
  Filled 2018-10-25: qty 250

## 2018-10-25 MED ORDER — SODIUM CHLORIDE 0.9 % IV SOLN
1200.0000 mg | Freq: Once | INTRAVENOUS | Status: AC
  Administered 2018-10-25: 11:00:00 1200 mg via INTRAVENOUS
  Filled 2018-10-25: qty 20

## 2018-10-25 MED ORDER — OXYCODONE HCL 10 MG PO TABS: 10.0000 mg | ORAL_TABLET | ORAL | 0 refills | Status: DC | PRN

## 2018-10-25 NOTE — Progress Notes (Signed)
Nutrition Follow-up:  Patient with high grade urothelial carcinoma.  Patient receiving tecentriq.    Met with patient during infusion today.  Patient reports that he usually eats breakfast and supper and has an ensure around 2pm.  Sometimes drinks 2 back to back.  Sometimes has bedtime snack but not every night.  Daughter and grand-daughter prepare meals for him.  He does not cook anymore.   Denies nausea, trouble chewing or swallowing, constipation or diarrhea.    Medications: megace  Labs: glucose 127, BUN 28, creatinine 1.15  Anthropometrics:   Weight stable at 116 lb.     NUTRITION DIAGNOSIS: Inadequate oral intake stable   INTERVENTION:  Encouraged patient to add bedtime snack daily for added calories and protein.  We discussed high calorie, high protein snacks that he likes to add.  RD wrote these examples on sheet as a reminder.   Encouraged at least 2 ensure daily.  Another case of ensure enlive given to patient today along with coupons.       MONITORING, EVALUATION, GOAL: Patient will consume adequate calories and protein to prevent further weight loss.   NEXT VISIT: Sept 24 during infusion  Jerick Khachatryan [name], [place], [place] Registered Dietitian [phone] (pager)

## 2018-10-26 ENCOUNTER — Inpatient Hospital Stay
Admission: EM | Admit: 2018-10-26 | Discharge: 2018-10-31 | DRG: 558 | Disposition: A | Discharge status: Skilled Nursing Facility | Payer: Medicare Other | Attending: Internal Medicine | Admitting: Internal Medicine

## 2018-10-26 ENCOUNTER — Other Ambulatory Visit: Payer: Self-pay

## 2018-10-26 ENCOUNTER — Emergency Department: Payer: Medicare Other

## 2018-10-26 DIAGNOSIS — N3001 Acute cystitis with hematuria: Secondary | ICD-10-CM | POA: Diagnosis present

## 2018-10-26 DIAGNOSIS — M6282 Rhabdomyolysis: Principal | ICD-10-CM | POA: Diagnosis present

## 2018-10-26 DIAGNOSIS — R0902 Hypoxemia: Secondary | ICD-10-CM

## 2018-10-26 DIAGNOSIS — R55 Syncope and collapse: Secondary | ICD-10-CM

## 2018-10-26 DIAGNOSIS — Z923 Personal history of irradiation: Secondary | ICD-10-CM

## 2018-10-26 DIAGNOSIS — Z9981 Dependence on supplemental oxygen: Secondary | ICD-10-CM

## 2018-10-26 DIAGNOSIS — I69954 Hemiplegia and hemiparesis following unspecified cerebrovascular disease affecting left non-dominant side: Secondary | ICD-10-CM

## 2018-10-26 DIAGNOSIS — J9611 Chronic respiratory failure with hypoxia: Secondary | ICD-10-CM | POA: Diagnosis present

## 2018-10-26 DIAGNOSIS — Z85118 Personal history of other malignant neoplasm of bronchus and lung: Secondary | ICD-10-CM

## 2018-10-26 DIAGNOSIS — N4289 Other specified disorders of prostate: Secondary | ICD-10-CM | POA: Diagnosis present

## 2018-10-26 DIAGNOSIS — W1839XA Other fall on same level, initial encounter: Secondary | ICD-10-CM | POA: Diagnosis present

## 2018-10-26 DIAGNOSIS — Z87891 Personal history of nicotine dependence: Secondary | ICD-10-CM

## 2018-10-26 DIAGNOSIS — Z681 Body mass index (BMI) 19 or less, adult: Secondary | ICD-10-CM

## 2018-10-26 DIAGNOSIS — Z9079 Acquired absence of other genital organ(s): Secondary | ICD-10-CM

## 2018-10-26 DIAGNOSIS — C61 Malignant neoplasm of prostate: Secondary | ICD-10-CM | POA: Diagnosis present

## 2018-10-26 DIAGNOSIS — Z66 Do not resuscitate: Secondary | ICD-10-CM | POA: Diagnosis not present

## 2018-10-26 DIAGNOSIS — K219 Gastro-esophageal reflux disease without esophagitis: Secondary | ICD-10-CM | POA: Diagnosis present

## 2018-10-26 DIAGNOSIS — R531 Weakness: Secondary | ICD-10-CM

## 2018-10-26 DIAGNOSIS — Z7951 Long term (current) use of inhaled steroids: Secondary | ICD-10-CM

## 2018-10-26 DIAGNOSIS — R636 Underweight: Secondary | ICD-10-CM | POA: Diagnosis present

## 2018-10-26 DIAGNOSIS — M069 Rheumatoid arthritis, unspecified: Secondary | ICD-10-CM | POA: Diagnosis present

## 2018-10-26 DIAGNOSIS — Z79899 Other long term (current) drug therapy: Secondary | ICD-10-CM

## 2018-10-26 DIAGNOSIS — Y92012 Bathroom of single-family (private) house as the place of occurrence of the external cause: Secondary | ICD-10-CM

## 2018-10-26 DIAGNOSIS — Z801 Family history of malignant neoplasm of trachea, bronchus and lung: Secondary | ICD-10-CM

## 2018-10-26 DIAGNOSIS — C679 Malignant neoplasm of bladder, unspecified: Secondary | ICD-10-CM | POA: Diagnosis present

## 2018-10-26 DIAGNOSIS — Z9221 Personal history of antineoplastic chemotherapy: Secondary | ICD-10-CM

## 2018-10-26 DIAGNOSIS — Z20828 Contact with and (suspected) exposure to other viral communicable diseases: Secondary | ICD-10-CM | POA: Diagnosis present

## 2018-10-26 DIAGNOSIS — E785 Hyperlipidemia, unspecified: Secondary | ICD-10-CM | POA: Diagnosis present

## 2018-10-26 DIAGNOSIS — Z888 Allergy status to other drugs, medicaments and biological substances status: Secondary | ICD-10-CM

## 2018-10-26 DIAGNOSIS — J449 Chronic obstructive pulmonary disease, unspecified: Secondary | ICD-10-CM | POA: Diagnosis present

## 2018-10-26 DIAGNOSIS — E860 Dehydration: Secondary | ICD-10-CM | POA: Diagnosis present

## 2018-10-26 LAB — CBC WITH DIFFERENTIAL/PLATELET
Abs Immature Granulocytes: 0.06 10*3/uL (ref 0.00–0.07)
Basophils Absolute: 0 10*3/uL (ref 0.0–0.1)
Basophils Relative: 0 %
Eosinophils Absolute: 0 10*3/uL (ref 0.0–0.5)
Eosinophils Relative: 0 %
HCT: 38.7 % — ABNORMAL LOW (ref 39.0–52.0)
Hemoglobin: 11.7 g/dL — ABNORMAL LOW (ref 13.0–17.0)
Immature Granulocytes: 1 %
Lymphocytes Relative: 5 %
Lymphs Abs: 0.6 10*3/uL — ABNORMAL LOW (ref 0.7–4.0)
MCH: 28.1 pg (ref 26.0–34.0)
MCHC: 30.2 g/dL (ref 30.0–36.0)
MCV: 92.8 fL (ref 80.0–100.0)
Monocytes Absolute: 0.4 10*3/uL (ref 0.1–1.0)
Monocytes Relative: 3 %
Neutro Abs: 10 10*3/uL — ABNORMAL HIGH (ref 1.7–7.7)
Neutrophils Relative %: 91 %
Platelets: 449 10*3/uL — ABNORMAL HIGH (ref 150–400)
RBC: 4.17 MIL/uL — ABNORMAL LOW (ref 4.22–5.81)
RDW: 17.2 % — ABNORMAL HIGH (ref 11.5–15.5)
WBC: 11 10*3/uL — ABNORMAL HIGH (ref 4.0–10.5)
nRBC: 0 % (ref 0.0–0.2)

## 2018-10-26 LAB — URINALYSIS, ROUTINE W REFLEX MICROSCOPIC
Bilirubin Urine: NEGATIVE
Glucose, UA: NEGATIVE mg/dL
Ketones, ur: 5 mg/dL — AB
Nitrite: NEGATIVE
Protein, ur: 100 mg/dL — AB
RBC / HPF: >50 RBC/hpf — ABNORMAL HIGH (ref 0–5)
Specific Gravity, Urine: 1.023 (ref 1.005–1.030)
Squamous Epithelial / HPF: NONE SEEN (ref 0–5)
WBC, UA: >50 WBC/hpf — ABNORMAL HIGH (ref 0–5)
pH: 7 (ref 5.0–8.0)

## 2018-10-26 NOTE — ED Triage Notes (Signed)
Pt to ED via EMS from home. Pt states he fell last night around 12:30am and was unable to get up until his daughter came to see him today at 8pm tonight. Pt states he has felt weak since cancer treatment yesterday. Hx of stage 4 bladder cancer. Pt denies any pain or injury from fall.

## 2018-10-26 NOTE — ED Provider Notes (Signed)
[facility] Emergency Department Provider Note  ____________________________________________   First MD Initiated Contact with Patient 10/26/18 2341     (approximate)  I have reviewed the triage vital signs and the nursing notes.   HISTORY  Chief Complaint Weakness    HPI Collin Gonzales is a 83 y.o. male here with generalized weakness.  Patient states that a underwent treatment for his bladder cancer 2 days ago.  Upon returning home, he went to the restroom.  He felt very lightheaded when going to the bathroom.  He was able to sit himself down due to this weakness.  However, since then, he is felt progressively more weak.  He was unable to get himself back up, and was actually on the ground overnight.  He does not believe he hit his head.  Did not take any of his medications.  He did not have anything eat or drink.  He states he feels better now that he is off the floor.  No pain.  No known fevers.  No recent illnesses.       Past Medical History:  Diagnosis Date   Arthritis    Collagen vascular disease (HCC)    rheumatoid arthritis   COPD (chronic obstructive pulmonary disease) (HCC)    GERD (gastroesophageal reflux disease)    History of prostate cancer    HLD (hyperlipidemia)    HOH (hard of hearing)    Insomnia    Oxygen deficit    2L HS AND PRN   Primary cancer of right lung ([place]) 2017   rad tx's.   Prostate cancer ([place]) 2006   Rad seed tx's.   Stroke [facility])    2000   Urothelial carcinoma of bladder ([place]) 10/2017   TURP to Prostate and chemo tx's.    Patient Active Problem List   Diagnosis Date Noted   Aortic atherosclerosis ([place]) 07/20/2018   Thrombocytopenia, unspecified ([place]) 07/20/2018   Urothelial carcinoma ([place]) 07/20/2018   Urothelial carcinoma of bladder ([place]) 11/12/2017   Urinary retention 11/01/2017   Dependence on supplemental oxygen 04/24/2017   Malnutrition ([place]) 04/24/2017   Hemiparesis  affecting left side as late effect of cerebrovascular accident (CVA) ([place]) 04/24/2017   Cataract 09/06/2016   Drug-induced folate deficiency anemia 09/06/2016   Esophagitis, reflux 09/06/2016   Iron deficiency anemia due to chronic blood loss 08/23/2016   Decrease in appetite 09/01/2015   GERD (gastroesophageal reflux disease) 08/05/2015   COPD (chronic obstructive pulmonary disease) ([place]) 08/20/2014   Dyslipidemia 08/20/2014   Chronic insomnia 08/20/2014   Arthritis or polyarthritis, rheumatoid ([place]) 08/20/2014   History of prostate cancer 11/01/2013   Flutter-fibrillation ([place]) 03/18/2005    Past Surgical History:  Procedure Laterality Date   APPENDECTOMY  1965   CATARACT EXTRACTION W/PHACO Right 01/05/2016   Procedure: CATARACT EXTRACTION PHACO AND INTRAOCULAR LENS PLACEMENT ([place]);  Surgeon: [name], MD;  Location: [facility] ORS;  Service: Ophthalmology;  Laterality: Right;  [place] 1.14 AP% 18.1 CDE 13.44 FLUID PACK LOT # [id number]   CYSTOSCOPY W/ RETROGRADES Bilateral 11/01/2017   Procedure: CYSTOSCOPY WITH RETROGRADE PYELOGRAM;  Surgeon: Hollice Espy, MD;  Location: [facility] ORS;  Service: Urology;  Laterality: Bilateral;   ENDOBRONCHIAL ULTRASOUND N/A 02/26/2015   Procedure: ENDOBRONCHIAL ULTRASOUND;  Surgeon: Flora Lipps, MD;  Location: [facility] ORS;  Service: Cardiopulmonary;  Laterality: N/A;   [place] RADIATION SEED  2002   PORTA CATH INSERTION N/A 05/14/2018   Procedure: PORTA CATH INSERTION;  Surgeon:  Algernon Huxley, MD;  Location: [place] CV LAB;  Service: Cardiovascular;  Laterality: N/A;   TRANSURETHRAL RESECTION OF PROSTATE N/A 11/01/2017   Procedure: TRANSURETHRAL RESECTION OF THE PROSTATE (TURP) (channel TURP);  Surgeon: Hollice Espy, MD;  Location: [facility] ORS;  Service: Urology;  Laterality: N/A;    Prior to Admission medications   Medication Sig Start Date End Date Taking? Authorizing Provider    Fluticasone-Umeclidin-Vilant (TRELEGY ELLIPTA) 100-62.5-25 MCG/INH AEPB Inhale 1 puff into the lungs daily. 10/22/18   [name], MD  HYDROcodone-acetaminophen (NORCO) 5-325 MG tablet Take 1 tablet by mouth every 6 (six) hours as needed for moderate pain. 09/11/18   Borders, Kirt Boys, NP  megestrol (MEGACE) 20 MG tablet Take 1 tablet (20 mg total) by mouth 2 (two) times daily. 03/30/18   [name], MD  methotrexate (RHEUMATREX) 2.5 MG tablet Take 4 tablets by mouth once a week.  10/05/17   [name], MD  Oxycodone HCl 10 MG TABS Take 1 tablet (10 mg total) by mouth every 4 (four) hours as needed. Patient taking differently: Take 10 mg by mouth every 4 (four) hours as needed (pain).  10/25/18   Lloyd Huger, MD  OXYGEN Place 2 L into the nose daily as needed (shortness of breath).     [provider]  QUEtiapine (SEROQUEL) 50 MG tablet Take 1 tablet (50 mg total) by mouth at bedtime. 10/22/18   [name], MD    Allergies Plaquenil [hydroxychloroquine] and Simvastatin  Family History  Problem Relation Age of Onset   Cancer Mother    Cancer Father        Lung Cancer   Cancer Sister        breast   Cancer Brother        stomach    Social History Social History   Tobacco Use   Smoking status: Former Smoker    Packs/day: 0.50    Years: 50.00    Pack years: 25.00    Types: Cigarettes    Quit date: 02/21/2013    Years since quitting: 5.6   Smokeless tobacco: Never Used   Tobacco comment: smoking cessation materials not required  Substance Use Topics   Alcohol use: No    Alcohol/week: 0.0 standard drinks   Drug use: No    Review of Systems  Review of Systems  Constitutional: Positive for fatigue. Negative for chills and fever.  HENT: Negative for sore throat.   Respiratory: Negative for shortness of breath.   Cardiovascular: Negative for chest pain.  Gastrointestinal: Negative for abdominal pain.  Genitourinary: Negative for  flank pain.  Musculoskeletal: Negative for neck pain.  Skin: Negative for rash and wound.  Allergic/Immunologic: Negative for immunocompromised state.  Neurological: Positive for weakness and light-headedness. Negative for numbness.  Hematological: Does not bruise/bleed easily.  All other systems reviewed and are negative.    ____________________________________________  PHYSICAL EXAM:      VITAL SIGNS: ED Triage Vitals  Enc Vitals Group     BP 10/26/18 2135 (!) 152/110     Pulse Rate 10/26/18 2135 93     Resp 10/26/18 2135 (!) 25     Temp 10/26/18 2135 98 F (36.7 C)     Temp Source 10/26/18 2135 Oral     SpO2 10/26/18 2135 98 %     Weight 10/26/18 2127 117 lb (53.1 kg)     Height 10/26/18 2127 5\' 8"  (1.727 m)     Head Circumference --  Peak Flow --      Pain Score 10/26/18 2127 0     Pain Loc --      Pain Edu? --      Excl. in [place]? --      Physical Exam Vitals signs and nursing note reviewed.  Constitutional:      General: He is not in acute distress.    Appearance: He is well-developed.  HENT:     Head: Normocephalic and atraumatic.     Mouth/Throat:     Mouth: Mucous membranes are dry.  Eyes:     Conjunctiva/sclera: Conjunctivae normal.  Neck:     Musculoskeletal: Neck supple.  Cardiovascular:     Rate and Rhythm: Normal rate and regular rhythm.     Heart sounds: Normal heart sounds. No murmur. No friction rub.  Pulmonary:     Effort: Pulmonary effort is normal. No respiratory distress.     Breath sounds: Normal breath sounds. No wheezing or rales.  Abdominal:     General: There is no distension.     Palpations: Abdomen is soft.     Tenderness: There is no abdominal tenderness.     Comments: Mild suprapubic tenderness  Skin:    General: Skin is warm.     Capillary Refill: Capillary refill takes less than 2 seconds.  Neurological:     Mental Status: He is alert and oriented to person, place, and time.     Motor: No abnormal muscle tone.        ____________________________________________   LABS (all labs ordered are listed, but only abnormal results are displayed)  Labs Reviewed  CBC WITH DIFFERENTIAL/PLATELET - Abnormal; Notable for the following components:      Result Value   WBC 11.0 (*)    RBC 4.17 (*)    Hemoglobin 11.7 (*)    HCT 38.7 (*)    RDW 17.2 (*)    Platelets 449 (*)    Neutro Abs 10.0 (*)    Lymphs Abs 0.6 (*)    All other components within normal limits  URINALYSIS, ROUTINE W REFLEX MICROSCOPIC - Abnormal; Notable for the following components:   Color, Urine YELLOW (*)    APPearance TURBID (*)    Hgb urine dipstick MODERATE (*)    Ketones, ur 5 (*)    Protein, ur 100 (*)    Leukocytes,Ua MODERATE (*)    RBC / HPF >50 (*)    WBC, UA >50 (*)    Bacteria, UA FEW (*)    All other components within normal limits  BASIC METABOLIC PANEL - Abnormal; Notable for the following components:   Glucose, Bld 121 (*)    BUN 31 (*)    All other components within normal limits  CK - Abnormal; Notable for the following components:   Total CK 3,682 (*)    All other components within normal limits  URINE CULTURE  SARS CORONAVIRUS 2 (TAT 6-24 HRS)    ____________________________________________  EKG: Normal sinus rhythm, normal axis, no acute findings ________________________________________  RADIOLOGY All imaging, including plain films, CT scans, and ultrasounds, independently reviewed by me, and interpretations confirmed via formal radiology reads.  ED MD interpretation:   Chest x-ray: Negative  Official radiology report(s): Dg Chest 2 View  Result Date: 10/26/2018 CLINICAL DATA:  Weakness EXAM: CHEST - 2 VIEW COMPARISON:  CT chest September 19, 2018 FINDINGS: Excessive centrilobular emphysematous changes seen throughout both lungs. Unchanged wedge-shaped area of scarring right midlung. There is a 1.7 cm  nodular opacity projecting over the left mid lung which was not clearly identified on the prior exam.  The cardiomediastinal silhouette is unchanged. No acute osseous abnormality. A right-sided MediPort catheter seen with tip at the superior cavoatrial junction. IMPRESSION: 1. 1.7 cm nodular opacity within the left mid lung which was not seen on the recent prior exam. If further evaluation is required, given the patient's history would recommend CT. 2. Extensive centrilobular emphysematous changes with scarring in the right midlung. Electronically Signed   By: Prudencio Pair M.D.   On: 10/26/2018 23:00    ____________________________________________  PROCEDURES   Procedure(s) performed (including Critical Care):  Procedures  ____________________________________________  INITIAL IMPRESSION / MDM / [place] / ED COURSE  As part of my medical decision making, I reviewed the following data within the electronic MEDICAL RECORD NUMBER Notes from prior ED visits and [place] Controlled Substance Database      *ROARKE MARCIANO was evaluated in Emergency Department on 10/27/2018 for the symptoms described in the history of present illness. He was evaluated in the context of the global COVID-19 pandemic, which necessitated consideration that the patient might be at risk for infection with the SARS-CoV-2 virus that causes COVID-19. Institutional protocols and algorithms that pertain to the evaluation of patients at risk for COVID-19 are in a state of rapid change based on information released by regulatory bodies including the CDC and federal and state organizations. These policies and algorithms were followed during the patient's care in the ED.  Some ED evaluations and interventions may be delayed as a result of limited staffing during the pandemic.*      Medical Decision Making: 83 year old male here with generalized weakness.  Suspect dehydration likely secondary to chemotherapy with possible component of UTI as well.  He does appear clinically very dry here.  Will give fluids, send labs, likely  admit for UTI with generalized weakness and inability to ambulate, especially considering he lives alone.  Patient care transferred to Dr. Alfred Levins at the end of my shift. Patient presentation, ED course, and plan of care discussed with review of all pertinent labs and imaging. Please see his/her note for further details regarding further ED course and disposition.   ____________________________________________  FINAL CLINICAL IMPRESSION(S) / ED DIAGNOSES  Final diagnoses:  Generalized weakness  Dehydration  Acute cystitis with hematuria  Non-traumatic rhabdomyolysis  Near syncope     MEDICATIONS GIVEN DURING THIS VISIT:  Medications  sodium chloride 0.9 % bolus 1,000 mL (1,000 mLs Intravenous New Bag/Given 10/27/18 0055)  cefTRIAXone (ROCEPHIN) 1 g in sodium chloride 0.9 % 100 mL IVPB (1 g Intravenous New Bag/Given 10/27/18 0052)     ED Discharge Orders    None       Note:  This document was prepared using Dragon voice recognition software and may include unintentional dictation errors.   Duffy Bruce, MD 10/27/18 0130

## 2018-10-27 ENCOUNTER — Other Ambulatory Visit: Payer: Self-pay

## 2018-10-27 DIAGNOSIS — J449 Chronic obstructive pulmonary disease, unspecified: Secondary | ICD-10-CM | POA: Diagnosis present

## 2018-10-27 DIAGNOSIS — W1839XA Other fall on same level, initial encounter: Secondary | ICD-10-CM | POA: Diagnosis present

## 2018-10-27 DIAGNOSIS — C61 Malignant neoplasm of prostate: Secondary | ICD-10-CM | POA: Diagnosis present

## 2018-10-27 DIAGNOSIS — J9811 Atelectasis: Secondary | ICD-10-CM | POA: Diagnosis not present

## 2018-10-27 DIAGNOSIS — Z66 Do not resuscitate: Secondary | ICD-10-CM | POA: Diagnosis not present

## 2018-10-27 DIAGNOSIS — Z888 Allergy status to other drugs, medicaments and biological substances status: Secondary | ICD-10-CM | POA: Diagnosis not present

## 2018-10-27 DIAGNOSIS — Y92012 Bathroom of single-family (private) house as the place of occurrence of the external cause: Secondary | ICD-10-CM | POA: Diagnosis not present

## 2018-10-27 DIAGNOSIS — E860 Dehydration: Secondary | ICD-10-CM | POA: Diagnosis present

## 2018-10-27 DIAGNOSIS — J9611 Chronic respiratory failure with hypoxia: Secondary | ICD-10-CM | POA: Diagnosis present

## 2018-10-27 DIAGNOSIS — Z681 Body mass index (BMI) 19 or less, adult: Secondary | ICD-10-CM | POA: Diagnosis not present

## 2018-10-27 DIAGNOSIS — Z801 Family history of malignant neoplasm of trachea, bronchus and lung: Secondary | ICD-10-CM | POA: Diagnosis not present

## 2018-10-27 DIAGNOSIS — Z87891 Personal history of nicotine dependence: Secondary | ICD-10-CM | POA: Diagnosis not present

## 2018-10-27 DIAGNOSIS — E785 Hyperlipidemia, unspecified: Secondary | ICD-10-CM | POA: Diagnosis present

## 2018-10-27 DIAGNOSIS — Z79899 Other long term (current) drug therapy: Secondary | ICD-10-CM | POA: Diagnosis not present

## 2018-10-27 DIAGNOSIS — I69954 Hemiplegia and hemiparesis following unspecified cerebrovascular disease affecting left non-dominant side: Secondary | ICD-10-CM | POA: Diagnosis not present

## 2018-10-27 DIAGNOSIS — N3001 Acute cystitis with hematuria: Secondary | ICD-10-CM | POA: Diagnosis present

## 2018-10-27 DIAGNOSIS — Z9981 Dependence on supplemental oxygen: Secondary | ICD-10-CM | POA: Diagnosis not present

## 2018-10-27 DIAGNOSIS — N4289 Other specified disorders of prostate: Secondary | ICD-10-CM | POA: Diagnosis present

## 2018-10-27 DIAGNOSIS — M069 Rheumatoid arthritis, unspecified: Secondary | ICD-10-CM | POA: Diagnosis present

## 2018-10-27 DIAGNOSIS — Z85118 Personal history of other malignant neoplasm of bronchus and lung: Secondary | ICD-10-CM | POA: Diagnosis not present

## 2018-10-27 DIAGNOSIS — K219 Gastro-esophageal reflux disease without esophagitis: Secondary | ICD-10-CM | POA: Diagnosis present

## 2018-10-27 DIAGNOSIS — M6282 Rhabdomyolysis: Secondary | ICD-10-CM | POA: Diagnosis present

## 2018-10-27 DIAGNOSIS — R636 Underweight: Secondary | ICD-10-CM | POA: Diagnosis present

## 2018-10-27 DIAGNOSIS — C679 Malignant neoplasm of bladder, unspecified: Secondary | ICD-10-CM | POA: Diagnosis present

## 2018-10-27 DIAGNOSIS — Z923 Personal history of irradiation: Secondary | ICD-10-CM | POA: Diagnosis not present

## 2018-10-27 DIAGNOSIS — M2518 Fistula, other specified site: Secondary | ICD-10-CM | POA: Diagnosis not present

## 2018-10-27 DIAGNOSIS — Z20828 Contact with and (suspected) exposure to other viral communicable diseases: Secondary | ICD-10-CM | POA: Diagnosis present

## 2018-10-27 DIAGNOSIS — Z9079 Acquired absence of other genital organ(s): Secondary | ICD-10-CM | POA: Diagnosis not present

## 2018-10-27 LAB — BASIC METABOLIC PANEL
Anion gap: 11 (ref 5–15)
BUN: 31 mg/dL — ABNORMAL HIGH (ref 8–23)
CO2: 22 mmol/L (ref 22–32)
Calcium: 9.1 mg/dL (ref 8.9–10.3)
Chloride: 104 mmol/L (ref 98–111)
Creatinine, Ser: 1.06 mg/dL (ref 0.61–1.24)
GFR calc Af Amer: >60 mL/min (ref >60)
GFR calc non Af Amer: >60 mL/min (ref >60)
Glucose, Bld: 121 mg/dL — ABNORMAL HIGH (ref 70–99)
Potassium: 4.8 mmol/L (ref 3.5–5.1)
Sodium: 137 mmol/L (ref 135–145)

## 2018-10-27 LAB — CK
Total CK: 1683 U/L — ABNORMAL HIGH (ref 49–397)
Total CK: 2112 U/L — ABNORMAL HIGH (ref 49–397)
Total CK: 3682 U/L — ABNORMAL HIGH (ref 49–397)

## 2018-10-27 LAB — TSH: TSH: 1.197 u[IU]/mL (ref 0.350–4.500)

## 2018-10-27 LAB — HEMOGLOBIN A1C
Hgb A1c MFr Bld: 5.7 % — ABNORMAL HIGH (ref 4.8–5.6)
Mean Plasma Glucose: 116.89 mg/dL

## 2018-10-27 LAB — SARS CORONAVIRUS 2 (TAT 6-24 HRS): SARS Coronavirus 2: NEGATIVE

## 2018-10-27 MED ORDER — ONDANSETRON HCL 4 MG/2ML IJ SOLN: 4.0000 mg | Freq: Four times a day (QID) | INTRAMUSCULAR | Status: DC | PRN

## 2018-10-27 MED ORDER — QUETIAPINE FUMARATE 25 MG PO TABS
50.0000 mg | ORAL_TABLET | Freq: Every day | ORAL | Status: DC
  Administered 2018-10-27 – 2018-10-30 (×4): 50 mg via ORAL
  Filled 2018-10-27 (×4): qty 2

## 2018-10-27 MED ORDER — FLUTICASONE FUROATE-VILANTEROL 100-25 MCG/INH IN AEPB
1.0000 | INHALATION_SPRAY | Freq: Every day | RESPIRATORY_TRACT | Status: DC
  Administered 2018-10-27 – 2018-10-31 (×5): 1 via RESPIRATORY_TRACT
  Filled 2018-10-27: qty 28

## 2018-10-27 MED ORDER — METHOTREXATE 2.5 MG PO TABS: 10.0000 mg | ORAL_TABLET | ORAL | Status: DC

## 2018-10-27 MED ORDER — MEGESTROL ACETATE 20 MG PO TABS
20.0000 mg | ORAL_TABLET | Freq: Two times a day (BID) | ORAL | Status: DC
  Administered 2018-10-27 – 2018-10-31 (×9): 20 mg via ORAL
  Filled 2018-10-27 (×10): qty 1

## 2018-10-27 MED ORDER — SODIUM CHLORIDE 0.9 % IV SOLN
1.0000 g | INTRAVENOUS | Status: DC
  Administered 2018-10-29 (×2): 1 g via INTRAVENOUS
  Filled 2018-10-27 (×2): qty 1
  Filled 2018-10-27: qty 10

## 2018-10-27 MED ORDER — DOCUSATE SODIUM 100 MG PO CAPS
100.0000 mg | ORAL_CAPSULE | Freq: Two times a day (BID) | ORAL | Status: DC
  Administered 2018-10-27 – 2018-10-31 (×8): 100 mg via ORAL
  Filled 2018-10-27 (×9): qty 1

## 2018-10-27 MED ORDER — SODIUM CHLORIDE 0.9 % IV SOLN
1.0000 g | Freq: Once | INTRAVENOUS | Status: AC
  Administered 2018-10-27: 1 g via INTRAVENOUS
  Filled 2018-10-27: qty 10

## 2018-10-27 MED ORDER — ENOXAPARIN SODIUM 40 MG/0.4ML ~~LOC~~ SOLN
40.0000 mg | SUBCUTANEOUS | Status: DC
  Administered 2018-10-27 – 2018-10-31 (×5): 40 mg via SUBCUTANEOUS
  Filled 2018-10-27 (×5): qty 0.4

## 2018-10-27 MED ORDER — SODIUM CHLORIDE 0.9 % IV SOLN
INTRAVENOUS | Status: DC
  Administered 2018-10-27 – 2018-10-28 (×2): via INTRAVENOUS

## 2018-10-27 MED ORDER — OXYCODONE HCL 5 MG PO TABS: 10.0000 mg | ORAL_TABLET | ORAL | Status: DC | PRN

## 2018-10-27 MED ORDER — ACETAMINOPHEN 650 MG RE SUPP: 650.0000 mg | Freq: Four times a day (QID) | RECTAL | Status: DC | PRN

## 2018-10-27 MED ORDER — ACETAMINOPHEN 325 MG PO TABS: 650.0000 mg | ORAL_TABLET | Freq: Four times a day (QID) | ORAL | Status: DC | PRN

## 2018-10-27 MED ORDER — FLUTICASONE-UMECLIDIN-VILANT 100-62.5-25 MCG/INH IN AEPB: 1.0000 | INHALATION_SPRAY | Freq: Every day | RESPIRATORY_TRACT | Status: DC

## 2018-10-27 MED ORDER — SODIUM CHLORIDE 0.9 % IV BOLUS
1000.0000 mL | Freq: Once | INTRAVENOUS | Status: AC
  Administered 2018-10-27: 01:00:00 1000 mL via INTRAVENOUS

## 2018-10-27 MED ORDER — ONDANSETRON HCL 4 MG PO TABS: 4.0000 mg | ORAL_TABLET | Freq: Four times a day (QID) | ORAL | Status: DC | PRN

## 2018-10-27 MED ORDER — UMECLIDINIUM BROMIDE 62.5 MCG/INH IN AEPB
1.0000 | INHALATION_SPRAY | Freq: Every day | RESPIRATORY_TRACT | Status: DC
  Administered 2018-10-27 – 2018-10-31 (×5): 1 via RESPIRATORY_TRACT
  Filled 2018-10-27: qty 7

## 2018-10-27 NOTE — Plan of Care (Signed)
°  Problem: Coping: °Goal: Level of anxiety will decrease °Outcome: Progressing °  °

## 2018-10-27 NOTE — ED Notes (Signed)
Collin Gonzales

## 2018-10-27 NOTE — Progress Notes (Signed)
Family Meeting Note  Advance Directive:yes  Today a meeting took place with the Patient.  The following clinical team members were present during this meeting:MD  The following were discussed:Patient's diagnosis: Prostate cancer, lung nodule, fall and rhabdomyolysis, Patient's progosis: Unable to determine and Goals for treatment: DNR  Patient said he has her daughter and grandchildren and they might make his decisions but he would like to be DNR in any adverse event.  Additional follow-up to be provided: PMD, Cancer center  Time spent during discussion:20 minutes  Vaughan Basta, MD

## 2018-10-27 NOTE — Progress Notes (Signed)
[place] [facility] [place] NAME: Collin Gonzales    MR#:  [id number]  DATE OF BIRTH:  22-Oct-1935  SUBJECTIVE:  CHIEF COMPLAINT:   Chief Complaint  Patient presents with  . Weakness   Fell down at home and could not get up and came with rhabdomyolysis.  Feels slightly better today  REVIEW OF SYSTEMS:  CONSTITUTIONAL: No fever, have fatigue or weakness.  EYES: No blurred or double vision.  EARS, NOSE, AND THROAT: No tinnitus or ear pain.  RESPIRATORY: No cough, shortness of breath, wheezing or hemoptysis.  CARDIOVASCULAR: No chest pain, orthopnea, edema.  GASTROINTESTINAL: No nausea, vomiting, diarrhea or abdominal pain.  GENITOURINARY: No dysuria, hematuria.  ENDOCRINE: No polyuria, nocturia,  HEMATOLOGY: No anemia, easy bruising or bleeding SKIN: No rash or lesion. MUSCULOSKELETAL: No joint pain or arthritis.   NEUROLOGIC: No tingling, numbness, weakness.  PSYCHIATRY: No anxiety or depression.   ROS  DRUG ALLERGIES:   Allergies  Allergen Reactions  . Plaquenil [Hydroxychloroquine] Other (See Comments)    Bad dreams  . Simvastatin Other (See Comments)    Bad dreams    VITALS:  Blood pressure 135/86, pulse 86, temperature 98 F (36.7 C), temperature source Oral, resp. rate (!) 32, height 5\' 8"  (1.727 m), weight 53.1 kg, SpO2 100 %.  PHYSICAL EXAMINATION:  GENERAL:  83 y.o.-year-old patient lying in the bed with no acute distress.  EYES: Pupils equal, round, reactive to light and accommodation. No scleral icterus. Extraocular muscles intact.  HEENT: Head atraumatic, normocephalic. Oropharynx and nasopharynx clear.  NECK:  Supple, no jugular venous distention. No thyroid enlargement, no tenderness.  LUNGS: Normal breath sounds bilaterally, no wheezing, rales,rhonchi or crepitation. No use of accessory muscles of respiration.  CARDIOVASCULAR: S1, S2 normal. No murmurs, rubs, or gallops.  ABDOMEN: Soft, nontender, nondistended. Bowel  sounds present. No organomegaly or mass.  EXTREMITIES: No pedal edema, cyanosis, or clubbing.  NEUROLOGIC: Cranial nerves II through XII are intact. Muscle strength 4/5 in all extremities. Sensation intact. Gait not checked.  PSYCHIATRIC: The patient is alert and oriented x 3.  SKIN: No obvious rash, lesion, or ulcer.   Physical Exam LABORATORY PANEL:   CBC Recent Labs  Lab 10/26/18 2138  WBC 11.0*  HGB 11.7*  HCT 38.7*  PLT 449*   ------------------------------------------------------------------------------------------------------------------  Chemistries  Recent Labs  Lab 10/24/18 1102 10/27/18 0005  NA 139 137  K 4.8 4.8  CL 106 104  CO2 25 22  GLUCOSE 127* 121*  BUN 28* 31*  CREATININE 1.15 1.06  CALCIUM 9.0 9.1  AST 18  --   ALT 9  --   ALKPHOS 61  --   BILITOT 0.4  --    ------------------------------------------------------------------------------------------------------------------  Cardiac Enzymes No results for input(s): TROPONINI in the last 168 hours. ------------------------------------------------------------------------------------------------------------------  RADIOLOGY:  Dg Chest 2 View  Result Date: 10/26/2018 CLINICAL DATA:  Weakness EXAM: CHEST - 2 VIEW COMPARISON:  CT chest September 19, 2018 FINDINGS: Excessive centrilobular emphysematous changes seen throughout both lungs. Unchanged wedge-shaped area of scarring right midlung. There is a 1.7 cm nodular opacity projecting over the left mid lung which was not clearly identified on the prior exam. The cardiomediastinal silhouette is unchanged. No acute osseous abnormality. A right-sided MediPort catheter seen with tip at the superior cavoatrial junction. IMPRESSION: 1. 1.7 cm nodular opacity within the left mid lung which was not seen on the recent prior exam. If further evaluation is required, given the patient's history would recommend  CT. 2. Extensive centrilobular emphysematous changes with scarring  in the right midlung. Electronically Signed   By: Prudencio Pair M.D.   On: 10/26/2018 23:00    ASSESSMENT AND PLAN:   Active Problems:   Rhabdomyolysis  1.  Rhabdomyolysis: No kidney injury as of yet.  Potassium within normal range.  Continue to follow CK.  Aggressive IV hydration with normal saline. 2.  UTI: Present on admission.  Continue ceftriaxone, follow urine culture. 3.  Rheumatoid arthritis: Continue methotrexate 4.  Fall: Secondary to generalized weakness following chemo for safety 5.  Left hemiparesis: Sequela of remote stroke 6.  COPD: Stable; continue Ellipta.   7.  DVT prophylaxis: Lovenox 8.  GI prophylaxis: None 9.  Prostate cancer-follows with Dr. [name] and on chemotherapy.         There is a new mass/nodularity found on chest x-ray-I advised to continue follow-up with oncology clinic as outpatient.   All the records are reviewed and case discussed with Care Management/Social Workerr. Management plans discussed with the patient, family and they are in agreement.  CODE STATUS: DNR  TOTAL TIME TAKING CARE OF THIS PATIENT: 35 minutes.   We will get physical therapy evaluation is patient fell down and could not get up.  POSSIBLE D/C IN 1-2 DAYS, DEPENDING ON CLINICAL CONDITION.   Vaughan Basta M.D on 10/27/2018   Between 7am to 6pm - Pager - [phone]  After 6pm go to [email or web address] - password [email or web address] [place] [facility]  Office  [phone]  CC: Primary care physician; [name], MD  Note: This dictation was prepared with Dragon dictation along with smaller phrase technology. Any transcriptional errors that result from this process are unintentional.

## 2018-10-27 NOTE — H&P (Signed)
Collin Gonzales is an 83 y.o. male.   Chief Complaint: Weakness HPI: The patient with past medical history of rheumatoid arthritis, cerebrovascular disease, hyperlipidemia, and COPD who is currently undergoing treatment for bladder cancer presents to the emergency department following a fall.  The patient was on the floor for approximately 8 hours as he did not have the strength to stand.  Evaluation in the emergency department was significant for likely urinary tract infection as well as elevated creatinine kinase.  The patient was started on ceftriaxone prior to the emergency department staff call hospitalist service for admission.  Past Medical History:  Diagnosis Date  . Arthritis   . Collagen vascular disease (HCC)    rheumatoid arthritis  . COPD (chronic obstructive pulmonary disease) ([place])   . GERD (gastroesophageal reflux disease)   . History of prostate cancer   . HLD (hyperlipidemia)   . HOH (hard of hearing)   . Insomnia   . Oxygen deficit    2L HS AND PRN  . Primary cancer of right lung ([place]) 2017   rad tx's.  . Prostate cancer ([place]) 2006   Rad seed tx's.  . Stroke ([place])    2000  . Urothelial carcinoma of bladder ([place]) 10/2017   TURP to Prostate and chemo tx's.    Past Surgical History:  Procedure Laterality Date  . APPENDECTOMY  1965  . CATARACT EXTRACTION W/PHACO Right 01/05/2016   Procedure: CATARACT EXTRACTION PHACO AND INTRAOCULAR LENS PLACEMENT (IOC);  Surgeon: [name], MD;  Location: [facility] ORS;  Service: Ophthalmology;  Laterality: Right;  [place] 1.14 AP% 18.1 CDE 13.44 FLUID PACK LOT # [id number] H  . CYSTOSCOPY W/ RETROGRADES Bilateral 11/01/2017   Procedure: CYSTOSCOPY WITH RETROGRADE PYELOGRAM;  Surgeon: Hollice Espy, MD;  Location: [facility] OR[facility];  Service: Urology;  Laterality: Bilateral;  . ENDOBRONCHIAL ULTRASOUND N/A 02/26/2015   Procedure: ENDOBRONCHIAL ULTRASOUND;  Surgeon: Flora Lipps, MD;  Location: [facility] OR[facility];  Service: Cardiopulmonary;  Laterality:  N/A;  . [place]  . INSERTION PROSTATE RADIATION SEED  2002  . PORTA CATH INSERTION N/A 05/14/2018   Procedure: PORTA CATH INSERTION;  Surgeon: Algernon Huxley, MD;  Location: [facility];  Service: Cardiovascular;  Laterality: N/A;  . TRANSURETHRAL RESECTION OF PROSTATE N/A 11/01/2017   Procedure: TRANSURETHRAL RESECTION OF THE PROSTATE (TURP) (channel TURP);  Surgeon: Hollice Espy, MD;  Location: [facility];  Service: Urology;  Laterality: N/A;    Family History  Problem Relation Age of Onset  . Cancer Mother   . Cancer Father        Lung Cancer  . Cancer Sister        breast  . Cancer Brother        stomach   Social History:  reports that he quit smoking about 5 years ago. His smoking use included cigarettes. He has a 25.00 pack-year smoking history. He has never used smokeless tobacco. He reports that he does not drink alcohol or use drugs.  Allergies:  Allergies  Allergen Reactions  . Plaquenil [Hydroxychloroquine] Other (See Comments)    Bad dreams  . Simvastatin Other (See Comments)    Bad dreams    Medications Prior to Admission  Medication Sig Dispense Refill  . Fluticasone-Umeclidin-Vilant (TRELEGY ELLIPTA) 100-62.5-25 MCG/INH AEPB Inhale 1 puff into the lungs daily. 60 each 5  . megestrol (MEGACE) 20 MG tablet Take 1 tablet (20 mg total) by mouth 2 (two) times daily. 180 tablet 1  . methotrexate (RHEUMATREX) 2.5 MG  tablet Take 4 tablets by mouth every Thursday.     . Oxycodone HCl 10 MG TABS Take 1 tablet (10 mg total) by mouth every 4 (four) hours as needed. (Patient taking differently: Take 10 mg by mouth every 4 (four) hours as needed (pain). ) 60 tablet 0  . OXYGEN Place 2 L into the nose daily as needed (shortness of breath).     . QUEtiapine (SEROQUEL) 50 MG tablet Take 1 tablet (50 mg total) by mouth at bedtime. 90 tablet 0    Results for orders placed or performed during the hospital encounter of 10/26/18 (from the past 48 hour(s))  CBC with  Differential     Status: Abnormal   Collection Time: 10/26/18  9:38 PM  Result Value Ref Range   WBC 11.0 (H) 4.0 - 10.5 K/uL   RBC 4.17 (L) 4.22 - 5.81 MIL/uL   Hemoglobin 11.7 (L) 13.0 - 17.0 g/dL   HCT 38.7 (L) 39.0 - 52.0 %   MCV 92.8 80.0 - 100.0 fL   MCH 28.1 26.0 - 34.0 pg   MCHC 30.2 30.0 - 36.0 g/dL   RDW 17.2 (H) 11.5 - 15.5 %   Platelets 449 (H) 150 - 400 K/uL   nRBC 0.0 0.0 - 0.2 %   Neutrophils Relative % 91 %   Neutro Abs 10.0 (H) 1.7 - 7.7 K/uL   Lymphocytes Relative 5 %   Lymphs Abs 0.6 (L) 0.7 - 4.0 K/uL   Monocytes Relative 3 %   Monocytes Absolute 0.4 0.1 - 1.0 K/uL   Eosinophils Relative 0 %   Eosinophils Absolute 0.0 0.0 - 0.5 K/uL   Basophils Relative 0 %   Basophils Absolute 0.0 0.0 - 0.1 K/uL   Immature Granulocytes 1 %   Abs Immature Granulocytes 0.06 0.00 - 0.07 K/uL    Comment: Performed at [facility], [address] [place], [place] [address]  Urinalysis, Routine w reflex microscopic     Status: Abnormal   Collection Time: 10/26/18 11:10 PM  Result Value Ref Range   Color, Urine YELLOW (A) YELLOW   APPearance TURBID (A) CLEAR   Specific Gravity, Urine 1.023 1.005 - 1.030   pH 7.0 5.0 - 8.0   Glucose, UA NEGATIVE NEGATIVE mg/dL   Hgb urine dipstick MODERATE (A) NEGATIVE   Bilirubin Urine NEGATIVE NEGATIVE   Ketones, ur 5 (A) NEGATIVE mg/dL   Protein, ur 100 (A) NEGATIVE mg/dL   Nitrite NEGATIVE NEGATIVE   Leukocytes,Ua MODERATE (A) NEGATIVE   RBC / HPF >50 (H) 0 - 5 RBC/hpf   WBC, UA >50 (H) 0 - 5 WBC/hpf   Bacteria, UA FEW (A) NONE SEEN   Squamous Epithelial / LPF NONE SEEN 0 - 5   WBC Clumps PRESENT     Comment: Performed at [facility], [address] [place], [place] [address]  Basic metabolic panel     Status: Abnormal   Collection Time: 10/27/18 12:05 AM  Result Value Ref Range   Sodium 137 135 - 145 mmol/L   Potassium 4.8 3.5 - 5.1 mmol/L   Chloride 104 98 - 111 mmol/L   CO2 22 22 - 32 mmol/L   Glucose,  Bld 121 (H) 70 - 99 mg/dL   BUN 31 (H) 8 - 23 mg/dL   Creatinine, Ser 1.06 0.61 - 1.24 mg/dL   Calcium 9.1 8.9 - 10.3 mg/dL   GFR calc non Af Amer >60 >60 mL/min   GFR calc Af Amer >60 >  60 mL/min   Anion gap 11 5 - 15    Comment: Performed at [facility], [address] [place], [place] [address]  CK     Status: Abnormal   Collection Time: 10/27/18 12:05 AM  Result Value Ref Range   Total CK 3,682 (H) 49 - 397 U/L    Comment: Performed at [facility], [address] [place], [place] [address]   Dg Chest 2 View  Result Date: 10/26/2018 CLINICAL DATA:  Weakness EXAM: CHEST - 2 VIEW COMPARISON:  CT chest September 19, 2018 FINDINGS: Excessive centrilobular emphysematous changes seen throughout both lungs. Unchanged wedge-shaped area of scarring right midlung. There is a 1.7 cm nodular opacity projecting over the left mid lung which was not clearly identified on the prior exam. The cardiomediastinal silhouette is unchanged. No acute osseous abnormality. A right-sided MediPort catheter seen with tip at the superior cavoatrial junction. IMPRESSION: 1. 1.7 cm nodular opacity within the left mid lung which was not seen on the recent prior exam. If further evaluation is required, given the patient's history would recommend CT. 2. Extensive centrilobular emphysematous changes with scarring in the right midlung. Electronically Signed   By: Prudencio Pair M.D.   On: 10/26/2018 23:00    Review of Systems  Constitutional: Negative for chills and fever.  HENT: Negative for sore throat and tinnitus.   Eyes: Negative for blurred vision and redness.  Respiratory: Negative for cough and shortness of breath.   Cardiovascular: Negative for chest pain, palpitations, orthopnea and PND.  Gastrointestinal: Negative for abdominal pain, diarrhea, nausea and vomiting.  Genitourinary: Negative for dysuria, frequency and urgency.  Musculoskeletal: Positive for falls. Negative for joint pain and  myalgias.  Skin: Negative for rash.       No lesions  Neurological: Positive for weakness. Negative for speech change and focal weakness.  Endo/Heme/Allergies: Does not bruise/bleed easily.       No temperature intolerance  Psychiatric/Behavioral: Negative for depression and suicidal ideas.    Blood pressure 135/86, pulse 86, temperature 98 F (36.7 C), temperature source Oral, resp. rate (!) 32, height 5\' 8"  (1.727 m), weight 53.1 kg, SpO2 100 %. Physical Exam  Vitals reviewed. Constitutional: He is oriented to person, place, and time. He appears well-developed and well-nourished. No distress.  HENT:  Head: Normocephalic and atraumatic.  Mouth/Throat: Oropharynx is clear and moist.  Eyes: Pupils are equal, round, and reactive to light. Conjunctivae and EOM are normal. No scleral icterus.  Neck: Normal range of motion. Neck supple. No JVD present. No tracheal deviation present. No thyromegaly present.  Cardiovascular: Normal rate, regular rhythm and normal heart sounds. Exam reveals no gallop and no friction rub.  No murmur heard. Respiratory: Effort normal and breath sounds normal. No respiratory distress.  GI: Soft. Bowel sounds are normal. He exhibits no distension. There is no abdominal tenderness.  Genitourinary:    Genitourinary Comments: Deferred   Musculoskeletal: Normal range of motion.        General: No edema.  Lymphadenopathy:    He has no cervical adenopathy.  Neurological: He is alert and oriented to person, place, and time. No cranial nerve deficit.  Skin: Skin is warm and dry. No rash noted. No erythema.  Psychiatric: He has a normal mood and affect. His behavior is normal. Judgment and thought content normal.     Assessment/Plan This is an 83 year old male admitted for rhabdomyolysis. 1.  Rhabdomyolysis: No kidney injury as of yet.  Potassium within normal range.  Continue to follow CK.  Aggressive IV hydration with normal saline. 2.  UTI: Present on admission.   Continue ceftriaxone 3.  Rheumatoid arthritis: Continue methotrexate 4.  Fall: Secondary to generalized weakness following chemo for safety 5.  Left hemiparesis: Sequela of remote stroke 6.  COPD: Stable; continue Ellipta.   7.  DVT prophylaxis: Lovenox 8.  GI prophylaxis: None The patient is a full code.  Time spent on admission orders and patient care approximately 45 minutes  Harrie Foreman, MD 10/27/2018, 7:08 AM

## 2018-10-27 NOTE — ED Notes (Signed)
.. ED TO INPATIENT HANDOFF REPORT  ED Nurse Name and Phone #: Delton Coombes  S Name/Age/Gender Collin Gonzales 83 y.o. male Room/Bed: ED26A/ED26A  Code Status   Code Status: Prior  Home/SNF/Other Home Patient oriented to: self, place, time and situation Is this baseline? Yes   Triage Complete: Triage complete  Chief Complaint Weakness  Triage Note Pt to ED via EMS from home. Pt states he fell last night around 12:30am and was unable to get up until his daughter came to see him today at 8pm tonight. Pt states he has felt weak since cancer treatment yesterday. Hx of stage 4 bladder cancer. Pt denies any pain or injury from fall.    Allergies Allergies  Allergen Reactions  . Plaquenil [Hydroxychloroquine] Other (See Comments)    Bad dreams  . Simvastatin Other (See Comments)    Bad dreams    Level of Care/Admitting Diagnosis ED Disposition    ED Disposition Condition [facility] Area: [place] [[id number]]  Level of Care: Med-Surg [16]  Covid Evaluation: Asymptomatic Screening Protocol (No Symptoms)  Diagnosis: Rhabdomyolysis [728.88.ICD-9-CM]  Admitting Physician: Harrie Foreman [[id number]]  Attending Physician: Harrie Foreman [[id number]]  Estimated length of stay: past midnight tomorrow  Certification:: I certify this patient will need inpatient services for at least 2 midnights  PT Class (Do Not Modify): Inpatient [101]  PT Acc Code (Do Not Modify): Private [1]       B Medical/Surgery History Past Medical History:  Diagnosis Date  . Arthritis   . Collagen vascular disease (HCC)    rheumatoid arthritis  . COPD (chronic obstructive pulmonary disease) ([place])   . GERD (gastroesophageal reflux disease)   . History of prostate cancer   . HLD (hyperlipidemia)   . HOH (hard of hearing)   . Insomnia   . Oxygen deficit    2L HS AND PRN  . Primary cancer of right lung ([place]) 2017   rad tx's.  . Prostate cancer ([place]) 2006    Rad seed tx's.  . Stroke ([place])    2000  . Urothelial carcinoma of bladder ([place]) 10/2017   TURP to Prostate and chemo tx's.   Past Surgical History:  Procedure Laterality Date  . APPENDECTOMY  1965  . CATARACT EXTRACTION W/PHACO Right 01/05/2016   Procedure: CATARACT EXTRACTION PHACO AND INTRAOCULAR LENS PLACEMENT (IOC);  Surgeon: [name], MD;  Location: [facility];  Service: Ophthalmology;  Laterality: Right;  [place] 1.14 AP% 18.1 CDE 13.44 FLUID PACK LOT # [id number] H  . CYSTOSCOPY W/ RETROGRADES Bilateral 11/01/2017   Procedure: CYSTOSCOPY WITH RETROGRADE PYELOGRAM;  Surgeon: Hollice Espy, MD;  Location: [facility];  Service: Urology;  Laterality: Bilateral;  . ENDOBRONCHIAL ULTRASOUND N/A 02/26/2015   Procedure: ENDOBRONCHIAL ULTRASOUND;  Surgeon: Flora Lipps, MD;  Location: [facility];  Service: Cardiopulmonary;  Laterality: N/A;  . [place]  . INSERTION PROSTATE RADIATION SEED  2002  . PORTA CATH INSERTION N/A 05/14/2018   Procedure: PORTA CATH INSERTION;  Surgeon: Algernon Huxley, MD;  Location: [facility];  Service: Cardiovascular;  Laterality: N/A;  . TRANSURETHRAL RESECTION OF PROSTATE N/A 11/01/2017   Procedure: TRANSURETHRAL RESECTION OF THE PROSTATE (TURP) (channel TURP);  Surgeon: Hollice Espy, MD;  Location: [facility];  Service: Urology;  Laterality: N/A;     A IV Location/Drains/Wounds Patient Lines/Drains/Airways Status   Active Line/Drains/Airways    Name:   Placement date:   Placement time:   Site:  Days:   Implanted Port 05/30/18 Right Chest   05/30/18    0947    Chest   150   Peripheral IV 10/26/18 Left Antecubital   10/26/18    2207    Antecubital   1   Peripheral IV 10/27/18 Right Antecubital   10/27/18    0237    Antecubital   less than 1   External Urinary Catheter   10/26/18    2222    -   1          Intake/Output Last 24 hours  Intake/Output Summary (Last 24 hours) at 10/27/2018 0352 Last data filed at 10/27/2018 0133 Gross per 24  hour  Intake 136.67 ml  Output -  Net 136.67 ml    Labs/Imaging Results for orders placed or performed during the hospital encounter of 10/26/18 (from the past 48 hour(s))  CBC with Differential     Status: Abnormal   Collection Time: 10/26/18  9:38 PM  Result Value Ref Range   WBC 11.0 (H) 4.0 - 10.5 K/uL   RBC 4.17 (L) 4.22 - 5.81 MIL/uL   Hemoglobin 11.7 (L) 13.0 - 17.0 g/dL   HCT 38.7 (L) 39.0 - 52.0 %   MCV 92.8 80.0 - 100.0 fL   MCH 28.1 26.0 - 34.0 pg   MCHC 30.2 30.0 - 36.0 g/dL   RDW 17.2 (H) 11.5 - 15.5 %   Platelets 449 (H) 150 - 400 K/uL   nRBC 0.0 0.0 - 0.2 %   Neutrophils Relative % 91 %   Neutro Abs 10.0 (H) 1.7 - 7.7 K/uL   Lymphocytes Relative 5 %   Lymphs Abs 0.6 (L) 0.7 - 4.0 K/uL   Monocytes Relative 3 %   Monocytes Absolute 0.4 0.1 - 1.0 K/uL   Eosinophils Relative 0 %   Eosinophils Absolute 0.0 0.0 - 0.5 K/uL   Basophils Relative 0 %   Basophils Absolute 0.0 0.0 - 0.1 K/uL   Immature Granulocytes 1 %   Abs Immature Granulocytes 0.06 0.00 - 0.07 K/uL    Comment: Performed at [facility], [address] [place], [place] [address]  Urinalysis, Routine w reflex microscopic     Status: Abnormal   Collection Time: 10/26/18 11:10 PM  Result Value Ref Range   Color, Urine YELLOW (A) YELLOW   APPearance TURBID (A) CLEAR   Specific Gravity, Urine 1.023 1.005 - 1.030   pH 7.0 5.0 - 8.0   Glucose, UA NEGATIVE NEGATIVE mg/dL   Hgb urine dipstick MODERATE (A) NEGATIVE   Bilirubin Urine NEGATIVE NEGATIVE   Ketones, ur 5 (A) NEGATIVE mg/dL   Protein, ur 100 (A) NEGATIVE mg/dL   Nitrite NEGATIVE NEGATIVE   Leukocytes,Ua MODERATE (A) NEGATIVE   RBC / HPF >50 (H) 0 - 5 RBC/hpf   WBC, UA >50 (H) 0 - 5 WBC/hpf   Bacteria, UA FEW (A) NONE SEEN   Squamous Epithelial / LPF NONE SEEN 0 - 5   WBC Clumps PRESENT     Comment: Performed at [facility], [address] [place], [place] [address]  Basic metabolic panel     Status: Abnormal    Collection Time: 10/27/18 12:05 AM  Result Value Ref Range   Sodium 137 135 - 145 mmol/L   Potassium 4.8 3.5 - 5.1 mmol/L   Chloride 104 98 - 111 mmol/L   CO2 22 22 - 32 mmol/L   Glucose, Bld 121 (H) 70 - 99 mg/dL   BUN  31 (H) 8 - 23 mg/dL   Creatinine, Ser 1.06 0.61 - 1.24 mg/dL   Calcium 9.1 8.9 - 10.3 mg/dL   GFR calc non Af Amer >60 >60 mL/min   GFR calc Af Amer >60 >60 mL/min   Anion gap 11 5 - 15    Comment: Performed at [facility], [address] [place], [place] [address]  CK     Status: Abnormal   Collection Time: 10/27/18 12:05 AM  Result Value Ref Range   Total CK 3,682 (H) 49 - 397 U/L    Comment: Performed at [facility], [address] [place], [place] [address]   Dg Chest 2 View  Result Date: 10/26/2018 CLINICAL DATA:  Weakness EXAM: CHEST - 2 VIEW COMPARISON:  CT chest September 19, 2018 FINDINGS: Excessive centrilobular emphysematous changes seen throughout both lungs. Unchanged wedge-shaped area of scarring right midlung. There is a 1.7 cm nodular opacity projecting over the left mid lung which was not clearly identified on the prior exam. The cardiomediastinal silhouette is unchanged. No acute osseous abnormality. A right-sided MediPort catheter seen with tip at the superior cavoatrial junction. IMPRESSION: 1. 1.7 cm nodular opacity within the left mid lung which was not seen on the recent prior exam. If further evaluation is required, given the patient's history would recommend CT. 2. Extensive centrilobular emphysematous changes with scarring in the right midlung. Electronically Signed   By: Prudencio Pair M.D.   On: 10/26/2018 23:00    Pending Labs Unresulted Labs (From admission, onward)    Start     Ordered   10/27/18 0112  SARS CORONAVIRUS 2 (TAT 6-24 HRS) Nasopharyngeal Nasopharyngeal Swab  (Asymptomatic/Tier 2 Patients Labs)  Once,   STAT    Question Answer Comment  Is this test for diagnosis or screening Screening   Symptomatic for COVID-19  as defined by CDC No   Hospitalized for COVID-19 No   Admitted to ICU for COVID-19 No   Previously tested for COVID-19 No   Resident in a congregate (group) care setting No   Employed in healthcare setting No      10/27/18 0112   10/26/18 2310  Urine Culture  Once,   R     10/26/18 2310   Signed and Held  Creatinine, serum  (enoxaparin (LOVENOX)    CrCl >/= 30 ml/min)  Weekly,   R    Comments: while on enoxaparin therapy    Signed and Held   Signed and Held  TSH  Add-on,   R     Signed and Held   Signed and Held  Hemoglobin A1c  Add-on,   R     Signed and Held          Vitals/Pain Today's Vitals   10/26/18 2127 10/26/18 2135 10/26/18 2300 10/26/18 2330  BP:  (!) 152/110 (!) 143/80 (!) 143/96  Pulse:  93  92  Resp:  (!) 25    Temp:  98 F (36.7 C)    TempSrc:  Oral    SpO2:  98%  97%  Weight: 53.1 kg     Height: 5\' 8"  (1.727 m)     PainSc: 0-No pain       Isolation Precautions No active isolations  Medications Medications  sodium chloride 0.9 % bolus 1,000 mL (1,000 mLs Intravenous New Bag/Given 10/27/18 0055)  cefTRIAXone (ROCEPHIN) 1 g in sodium chloride 0.9 % 100 mL IVPB (0 g Intravenous Stopped 10/27/18 0133)    Mobility non-ambulatory  High fall risk     R Recommendations: See Admitting Provider Note  Report given to:   Additional Notes:

## 2018-10-28 ENCOUNTER — Inpatient Hospital Stay: Payer: Medicare Other

## 2018-10-28 LAB — URINE CULTURE: Culture: >=100000 — AB

## 2018-10-28 LAB — CK: Total CK: 1136 U/L — ABNORMAL HIGH (ref 49–397)

## 2018-10-28 NOTE — Evaluation (Signed)
Physical Therapy Evaluation Patient Details Name: Collin Gonzales MRN: [id number] DOB: Jan 22, 1936 Today's Date: 10/28/2018   History of Present Illness  Pt presented to ED 10/27/18 after a fall at home and pt unable to get up off the floor for 8 hours, pt admitted for UTI and rhabdomyolysis, pt is currently undergoing treatment for bladder cancer. PMH: RA, COPD, HLD, stroke, cancer  Clinical Impression  Pt is an 83 yo male admitted for above. Pt in bed upon arrival and agreed to participate with PT. Pt AxO x4 however pt reports he did not fall prior to coming to hospital. He reports he started to not fell well while in the bathroom so he sat down but then was not strong enough to get back up. Pt on 5L O2 nasal cannula on arrival and end of session. Pt presents with decreased strength, balance, and activity tolerance. Pt required min assist to sit EOB and min guard for transfers and ambulation. Pt SpO2 dropped mid 80s sitting EOB and after strength testing however increased to above 90% with rest and cuing for deep breaths. Pt denied feeling short of breath. Sp02 dropped to 80% with standing so O2 increased to 8L on portable tank for further mobility with pt maintaining 90%. Pt ambulated to chair and reported feeling fatigued. Sp02 90% after room ambulation on 8L. Pt would benefit from skilled acute therapy to improve deficits. Pt would also benefit from short term rehab post discharge in order to improve mobility deficits and independence to allow for return to PLOF.     Follow Up Recommendations SNF;Supervision for mobility/OOB    Equipment Recommendations  Rolling walker with 5" wheels    Recommendations for Other Services       Precautions / Restrictions Precautions Precautions: Fall Restrictions Weight Bearing Restrictions: No Other Position/Activity Restrictions: monitor O2 closely with activity      Mobility  Bed Mobility Overal bed mobility: Needs Assistance Bed Mobility: Supine  to Sit     Supine to sit: Min assist;HOB elevated     General bed mobility comments: min assist for trunk elevation, pt requiring verbal cuing for proper sequencing and use of bed rails  Transfers Overall transfer level: Needs assistance Equipment used: Rolling walker (2 wheeled) Transfers: Sit to/from Stand Sit to Stand: Min guard         General transfer comment: min guard for safety, pt required vc/tc for correct hand placement and RW management, no physical assist required to rise, pt stands with increased trunk forward flexion, no over LOB noted  Ambulation/Gait Ambulation/Gait assistance: Min guard Gait Distance (Feet): 10 Feet Assistive device: Rolling walker (2 wheeled) Gait Pattern/deviations: Decreased stride length;Wide base of support;Trunk flexed Gait velocity: decreased   General Gait Details: pt ambulated within room to recliner, pt required min guard for safety and line/lead management, VC for RW management, pt reports typically ambulating faster than today, pt ambulates with wide base of support and with RW out in front of him  Stairs            Wheelchair Mobility    Modified Rankin (Stroke Patients Only)       Balance Overall balance assessment: Needs assistance;History of Falls Sitting-balance support: Single extremity supported Sitting balance-Leahy Scale: Fair Sitting balance - Comments: pt requires single UE support and feet supported sitting EOB     Standing balance-Leahy Scale: Poor Standing balance comment: pt reliant on UE support from RW for standing balance  Pertinent Vitals/Pain Pain Assessment: No/denies pain    Home Living Family/patient expects to be discharged to:: Private residence Living Arrangements: Alone Available Help at Discharge: Family;Available PRN/intermittently Type of Home: Apartment Home Access: Level entry     Home Layout: One level Home Equipment: None       Prior Function Level of Independence: Needs assistance      ADL's / Homemaking Assistance Needed: pt reports he needs help with cleaning the house and cooking  Comments: pt reports ambulating in home and in the community without assistance, pt denies any other falls in the last year     Hand Dominance        Extremity/Trunk Assessment   Upper Extremity Assessment Upper Extremity Assessment: Generalized weakness    Lower Extremity Assessment Lower Extremity Assessment: Generalized weakness    Cervical / Trunk Assessment Cervical / Trunk Assessment: Kyphotic  Communication   Communication: HOH  Cognition Arousal/Alertness: Awake/alert Behavior During Therapy: WFL for tasks assessed/performed Overall Cognitive Status: Within Functional Limits for tasks assessed                                        General Comments      Exercises Total Joint Exercises Ankle Circles/Pumps: AROM;Both;10 reps Heel Slides: AROM;Both;10 reps Hip ABduction/ADduction: AROM;Both;10 reps   Assessment/Plan    PT Assessment Patient needs continued PT services  PT Problem List Decreased strength;Decreased mobility;Decreased safety awareness;Decreased range of motion;Decreased activity tolerance;Cardiopulmonary status limiting activity;Decreased knowledge of use of DME;Decreased balance       PT Treatment Interventions DME instruction;Therapeutic exercise;Gait training;Balance training;Stair training;Neuromuscular re-education;Functional mobility training;Therapeutic activities;Patient/family education    PT Goals (Current goals can be found in the Care Plan section)  Acute Rehab PT Goals Patient Stated Goal: return home PT Goal Formulation: With patient Time For Goal Achievement: 11/04/18 Potential to Achieve Goals: Good    Frequency Min 2X/week   Barriers to discharge Decreased caregiver support      Co-evaluation               AM-PAC PT "6 Clicks"  Mobility  Outcome Measure Help needed turning from your back to your side while in a flat bed without using bedrails?: A Little Help needed moving from lying on your back to sitting on the side of a flat bed without using bedrails?: A Little Help needed moving to and from a bed to a chair (including a wheelchair)?: A Little Help needed standing up from a chair using your arms (e.g., wheelchair or bedside chair)?: A Little Help needed to walk in hospital room?: A Little Help needed climbing 3-5 steps with a railing? : A Lot 6 Click Score: 17    End of Session Equipment Utilized During Treatment: Gait belt;Oxygen Activity Tolerance: Patient limited by fatigue Patient left: in chair;with call bell/phone within reach;with chair alarm set;with SCD's reapplied Nurse Communication: Mobility status PT Visit Diagnosis: Unsteadiness on feet (R26.81);Muscle weakness (generalized) (M62.81);History of falling (Z91.81)    Time: 3664-4034 PT Time Calculation (min) (ACUTE ONLY): 40 min   Charges:   PT Evaluation $PT Eval Moderate Complexity: 1 Mod PT Treatments $Therapeutic Exercise: 8-22 mins        Miryam Mcelhinney PT, DPT 9:00 AM,10/28/18 [phone]

## 2018-10-28 NOTE — TOC Initial Note (Signed)
Transition of Care [facility]) - Initial/Assessment Note    Patient Details  Name: Collin Gonzales MRN: [id number] Date of Birth: 1936-02-15  [facility]) CM/SW Contact:    Latanya Maudlin, RN Phone Number: 10/28/2018, 10:49 AM  Clinical Narrative:  [facility] team consulted to assist with disposition after PT recommended SNF. Patient typically lives at home alone but acknowledges current need for short term rehab. Per patient he has been to [facility] before and benefited from rehab there. Would prefer to go there again if able. FL2 done and offers sent out through the [place].                  Expected Discharge Plan: Skilled Nursing Facility Barriers to Discharge: Continued Medical Work up   Patient Goals and CMS Choice   CMS Medicare.gov Compare Post Acute Care list provided to:: Patient Choice offered to / list presented to : Patient  Expected Discharge Plan and Services Expected Discharge Plan: [place] arrangements for the past 2 months: Single Family Home                                      Prior Living Arrangements/Services Living arrangements for the past 2 months: Single Family Home Lives with:: Self Patient language and need for interpreter reviewed:: Yes Do you feel safe going back to the place where you live?: Yes      Need for Family Participation in Patient Care: No (Comment)     Criminal Activity/Legal Involvement Pertinent to Current Situation/Hospitalization: No - Comment as needed  Activities of Daily Living      Permission Sought/Granted                  Emotional Assessment Appearance:: Appears stated age Attitude/Demeanor/Rapport: Gracious, Engaged Affect (typically observed): Stable Orientation: : Oriented to Self, Oriented to Place, Oriented to  Time, Oriented to Situation      Admission diagnosis:  Dehydration [E86.0] Acute cystitis with hematuria [N30.01] Generalized weakness [R53.1] Near  syncope [R55] Non-traumatic rhabdomyolysis [M62.82] Patient Active Problem List   Diagnosis Date Noted  . Rhabdomyolysis 10/27/2018  . Aortic atherosclerosis ([place]) 07/20/2018  . Thrombocytopenia, unspecified ([place]) 07/20/2018  . Urothelial carcinoma ([place]) 07/20/2018  . Urothelial carcinoma of bladder ([place]) 11/12/2017  . Urinary retention 11/01/2017  . Dependence on supplemental oxygen 04/24/2017  . Malnutrition ([place]) 04/24/2017  . Hemiparesis affecting left side as late effect of cerebrovascular accident (CVA) ([place]) 04/24/2017  . Cataract 09/06/2016  . Drug-induced folate deficiency anemia 09/06/2016  . Esophagitis, reflux 09/06/2016  . Iron deficiency anemia due to chronic blood loss 08/23/2016  . Decrease in appetite 09/01/2015  . GERD (gastroesophageal reflux disease) 08/05/2015  . COPD (chronic obstructive pulmonary disease) ([place]) 08/20/2014  . Dyslipidemia 08/20/2014  . Chronic insomnia 08/20/2014  . Arthritis or polyarthritis, rheumatoid ([place]) 08/20/2014  . History of prostate cancer 11/01/2013  . Flutter-fibrillation ([place]) 03/18/2005   PCP:  [name], MD Pharmacy:   [id number] [place], [place] - [address] [place] [address] Phone: [phone] Fax: [phone]     Social Determinants of Health (SDOH) Interventions    Readmission Risk Interventions Readmission Risk Prevention Plan 10/28/2018  Transportation Screening Complete  Social Work Consult for [place] Planning/Counseling Complete  Palliative Care Screening Not Applicable  Medication Review Press photographer)  Complete  Some recent data might be hidden

## 2018-10-28 NOTE — Progress Notes (Signed)
[place] at [place] NAME: Collin Gonzales    MR#:  [id number]  DATE OF BIRTH:  [birth year]  SUBJECTIVE:  CHIEF COMPLAINT:   Chief Complaint  Patient presents with  . Weakness   Fell down at home and could not get up and came with rhabdomyolysis.  Feels slightly better today. Have generalized weakness.  REVIEW OF SYSTEMS:  CONSTITUTIONAL: No fever, have fatigue or weakness.  EYES: No blurred or double vision.  EARS, NOSE, AND THROAT: No tinnitus or ear pain.  RESPIRATORY: No cough, shortness of breath, wheezing or hemoptysis.  CARDIOVASCULAR: No chest pain, orthopnea, edema.  GASTROINTESTINAL: No nausea, vomiting, diarrhea or abdominal pain.  GENITOURINARY: No dysuria, hematuria.  ENDOCRINE: No polyuria, nocturia,  HEMATOLOGY: No anemia, easy bruising or bleeding SKIN: No rash or lesion. MUSCULOSKELETAL: No joint pain or arthritis.   NEUROLOGIC: No tingling, numbness, weakness.  PSYCHIATRY: No anxiety or depression.   ROS  DRUG ALLERGIES:   Allergies  Allergen Reactions  . Plaquenil [Hydroxychloroquine] Other (See Comments)    Bad dreams  . Simvastatin Other (See Comments)    Bad dreams    VITALS:  Blood pressure 128/62, pulse 77, temperature 98 F (36.7 C), temperature source Oral, resp. rate 16, height 5\' 8"  (1.727 m), weight 50.9 kg, SpO2 97 %.  PHYSICAL EXAMINATION:  GENERAL:  83 y.o.-year-old patient lying in the bed with no acute distress.  EYES: Pupils equal, round, reactive to light and accommodation. No scleral icterus. Extraocular muscles intact.  HEENT: Head atraumatic, normocephalic. Oropharynx and nasopharynx clear.  NECK:  Supple, no jugular venous distention. No thyroid enlargement, no tenderness.  LUNGS: Normal breath sounds bilaterally, no wheezing, rales,rhonchi or crepitation. No use of accessory muscles of respiration.  CARDIOVASCULAR: S1, S2 normal. No murmurs, rubs, or gallops.  ABDOMEN: Soft,  nontender, nondistended. Bowel sounds present. No organomegaly or mass.  EXTREMITIES: No pedal edema, cyanosis, or clubbing.  NEUROLOGIC: Cranial nerves II through XII are intact. Muscle strength 4/5 in all extremities. Sensation intact. Gait not checked.  PSYCHIATRIC: The patient is alert and oriented x 3.  SKIN: No obvious rash, lesion, or ulcer.   Physical Exam LABORATORY PANEL:   CBC Recent Labs  Lab 10/26/18 2138  WBC 11.0*  HGB 11.7*  HCT 38.7*  PLT 449*   ------------------------------------------------------------------------------------------------------------------  Chemistries  Recent Labs  Lab 10/24/18 1102 10/27/18 0005  NA 139 137  K 4.8 4.8  CL 106 104  CO2 25 22  GLUCOSE 127* 121*  BUN 28* 31*  CREATININE 1.15 1.06  CALCIUM 9.0 9.1  AST 18  --   ALT 9  --   ALKPHOS 61  --   BILITOT 0.4  --    ------------------------------------------------------------------------------------------------------------------  Cardiac Enzymes No results for input(s): TROPONINI in the last 168 hours. ------------------------------------------------------------------------------------------------------------------  RADIOLOGY:  Dg Chest 2 View  Result Date: 10/28/2018 CLINICAL DATA:  Hypoxia. Hx of urothelial carcinoma of bladder- 10/2017, stroke- 2000, prostate cancer- 2006, primary cancer of right lung- 2017, COPD, port a cath insertion- 04/2018. Former smoker. EXAM: CHEST - 2 VIEW COMPARISON:  10/26/2018 FINDINGS: Patient has a RIGHT-sided PowerPort. The heart size is normal accounting for AP position. They are streaky opacity at the LEFT lung base, consistent with subsegmental atelectasis. Focal opacity is identified in the MEDIAL RIGHT lung base, stable in appearance and chronic. No new consolidation. Small pleural effusions. Lungs are hyperinflated. Perihilar peribronchial thickening. IMPRESSION: 1. COPD and bronchitic changes. 2. Bibasilar atelectasis. 3. Lungs  otherwise  clear. Electronically Signed   By: Nolon Nations M.D.   On: 10/28/2018 11:19   Dg Chest 2 View  Result Date: 10/26/2018 CLINICAL DATA:  Weakness EXAM: CHEST - 2 VIEW COMPARISON:  CT chest September 19, 2018 FINDINGS: Excessive centrilobular emphysematous changes seen throughout both lungs. Unchanged wedge-shaped area of scarring right midlung. There is a 1.7 cm nodular opacity projecting over the left mid lung which was not clearly identified on the prior exam. The cardiomediastinal silhouette is unchanged. No acute osseous abnormality. A right-sided MediPort catheter seen with tip at the superior cavoatrial junction. IMPRESSION: 1. 1.7 cm nodular opacity within the left mid lung which was not seen on the recent prior exam. If further evaluation is required, given the patient's history would recommend CT. 2. Extensive centrilobular emphysematous changes with scarring in the right midlung. Electronically Signed   By: Prudencio Pair M.D.   On: 10/26/2018 23:00    ASSESSMENT AND PLAN:   Active Problems:   Rhabdomyolysis  1.  Rhabdomyolysis: No kidney injury as of yet.  Potassium within normal range.  Continue to follow CK.  Aggressive IV hydration with normal saline. CK is coming down. 2.  UTI: Present on admission.  Continue ceftriaxone, follow urine culture- multiple species. 3.  Rheumatoid arthritis: Continue methotrexate 4.  Fall: Secondary to generalized weakness following chemo for safety 5.  Left hemiparesis: Sequela of remote stroke 6.  COPD: Stable; continue Ellipta.   7.  DVT prophylaxis: Lovenox 8.  GI prophylaxis: None 9.  Prostate cancer-follows with Dr. [name] and on chemotherapy.         There is a new mass/nodularity found on chest x-ray-I advised to continue follow-up with oncology clinic as outpatient.   All the records are reviewed and case discussed with Care Management/Social Workerr. Management plans discussed with the patient, family and they are in agreement.  CODE  STATUS: DNR  TOTAL TIME TAKING CARE OF THIS PATIENT: 35 minutes.   Need SNF placement as per PT. Pt agrees with that.  POSSIBLE D/C IN 1-2 DAYS, DEPENDING ON CLINICAL CONDITION.   Vaughan Basta M.D on 10/28/2018   Between 7am to 6pm - Pager - [phone]  After 6pm go to [email or web address] - password [email or web address] [place] [facility]  Office  [phone]  CC: Primary care physician; [name], MD  Note: This dictation was prepared with Dragon dictation along with smaller phrase technology. Any transcriptional errors that result from this process are unintentional.

## 2018-10-28 NOTE — NC FL2 (Signed)
[place] LEVEL OF CARE SCREENING TOOL     IDENTIFICATION  Patient Name: Collin Gonzales Birthdate: 1936/02/21 Sex: male Admission Date (Current Location): 10/26/2018  [place] and [place] Number:  Engineering geologist and Address:  [facility], [address], [place], [place] [address]      Provider Number: [id number]  Attending Physician Name and Address:  Vaughan Basta, *  Relative Name and Phone Number:  Baldinger,veronica Daughter   [phone]    Current Level of Care: Hospital Recommended Level of Care: [place] Prior Approval Number:    Date Approved/Denied:   PASRR Number: [id number]  Discharge Plan: SNF    Current Diagnoses: Patient Active Problem List   Diagnosis Date Noted  . Rhabdomyolysis 10/27/2018  . Aortic atherosclerosis ([place]) 07/20/2018  . Thrombocytopenia, unspecified ([place]) 07/20/2018  . Urothelial carcinoma ([place]) 07/20/2018  . Urothelial carcinoma of bladder ([place]) 11/12/2017  . Urinary retention 11/01/2017  . Dependence on supplemental oxygen 04/24/2017  . Malnutrition ([place]) 04/24/2017  . Hemiparesis affecting left side as late effect of cerebrovascular accident (CVA) ([place]) 04/24/2017  . Cataract 09/06/2016  . Drug-induced folate deficiency anemia 09/06/2016  . Esophagitis, reflux 09/06/2016  . Iron deficiency anemia due to chronic blood loss 08/23/2016  . Decrease in appetite 09/01/2015  . GERD (gastroesophageal reflux disease) 08/05/2015  . COPD (chronic obstructive pulmonary disease) ([place]) 08/20/2014  . Dyslipidemia 08/20/2014  . Chronic insomnia 08/20/2014  . Arthritis or polyarthritis, rheumatoid ([place]) 08/20/2014  . History of prostate cancer 11/01/2013  . Flutter-fibrillation ([place]) 03/18/2005    Orientation RESPIRATION BLADDER Height & Weight     Self, Time, Situation, Place  O2 Continent Weight: 50.9 kg Height:  5\' 8"  (172.7 cm)  BEHAVIORAL SYMPTOMS/MOOD  NEUROLOGICAL BOWEL NUTRITION STATUS      Continent Diet  AMBULATORY STATUS COMMUNICATION OF NEEDS Skin   Extensive Assist Verbally Skin abrasions                       Personal Care Assistance Level of Assistance  Bathing, Feeding, Dressing, Total care Bathing Assistance: Limited assistance Feeding assistance: Independent Dressing Assistance: Maximum assistance Total Care Assistance: Maximum assistance   Functional Limitations Info  Sight, Speech, Hearing Sight Info: Adequate Hearing Info: Adequate Speech Info: Adequate    SPECIAL CARE FACTORS FREQUENCY  PT (By licensed PT), OT (By licensed OT)     PT Frequency: 5x per week OT Frequency: 5x per week            Contractures Contractures Info: Not present    Additional Factors Info  Code Status, Allergies Code Status Info: Full code Allergies Info: simvastatin, palquenil           Current Medications (10/28/2018):  This is the current hospital active medication list Current Facility-Administered Medications  Medication Dose Route Frequency Provider Last Rate Last Dose  . 0.9 %  sodium chloride infusion   Intravenous Continuous Vaughan Basta, MD 50 mL/hr at 10/28/18 1027    . acetaminophen (TYLENOL) tablet 650 mg  650 mg Oral Q6H PRN Harrie Foreman, MD       Or  . acetaminophen (TYLENOL) suppository 650 mg  650 mg Rectal Q6H PRN Harrie Foreman, MD      . cefTRIAXone (ROCEPHIN) 1 g in sodium chloride 0.9 % 100 mL IVPB  1 g Intravenous Q24H Harrie Foreman, MD      . docusate sodium (COLACE) capsule 100 mg  100 mg  Oral BID Harrie Foreman, MD   100 mg at 10/28/18 1036  . enoxaparin (LOVENOX) injection 40 mg  40 mg Subcutaneous Q24H Harrie Foreman, MD   40 mg at 10/28/18 1036  . fluticasone furoate-vilanterol (BREO ELLIPTA) 100-25 MCG/INH 1 puff  1 puff Inhalation Daily Hall, Scott A, RPH   1 puff at 10/28/18 1036   And  . umeclidinium bromide (INCRUSE ELLIPTA) 62.5 MCG/INH 1 puff  1 puff  Inhalation Daily Hart Robinsons A, RPH   1 puff at 10/28/18 1036  . megestrol (MEGACE) tablet 20 mg  20 mg Oral BID Harrie Foreman, MD   20 mg at 10/28/18 1036  . [START ON 11/01/2018] methotrexate (RHEUMATREX) tablet 10 mg  10 mg Oral Q Jani Files, MD      . ondansetron [facility]) tablet 4 mg  4 mg Oral Q6H PRN Harrie Foreman, MD       Or  . ondansetron [facility]) injection 4 mg  4 mg Intravenous Q6H PRN Harrie Foreman, MD      . oxyCODONE (Oxy IR/ROXICODONE) immediate release tablet 10 mg  10 mg Oral Q4H PRN Harrie Foreman, MD      . QUEtiapine (SEROQUEL) tablet 50 mg  50 mg Oral QHS Harrie Foreman, MD   50 mg at 10/27/18 2156     Discharge Medications: Please see discharge summary for a list of discharge medications.  Relevant Imaging Results:  Relevant Lab Results:   Additional Information ss # [id number]  Latanya Maudlin, RN

## 2018-10-29 LAB — BASIC METABOLIC PANEL
Anion gap: 6 (ref 5–15)
BUN: 19 mg/dL (ref 8–23)
CO2: 23 mmol/L (ref 22–32)
Calcium: 8.3 mg/dL — ABNORMAL LOW (ref 8.9–10.3)
Chloride: 108 mmol/L (ref 98–111)
Creatinine, Ser: 0.78 mg/dL (ref 0.61–1.24)
GFR calc Af Amer: >60 mL/min (ref >60)
GFR calc non Af Amer: >60 mL/min (ref >60)
Glucose, Bld: 81 mg/dL (ref 70–99)
Potassium: 3.6 mmol/L (ref 3.5–5.1)
Sodium: 137 mmol/L (ref 135–145)

## 2018-10-29 LAB — CK: Total CK: 576 U/L — ABNORMAL HIGH (ref 49–397)

## 2018-10-29 MED ORDER — SODIUM CHLORIDE 0.9 % IV SOLN
INTRAVENOUS | Status: DC | PRN
  Administered 2018-10-29: 23:00:00 250 mL via INTRAVENOUS

## 2018-10-29 NOTE — TOC Progression Note (Signed)
Transition of Care [facility]) - Progression Note    Patient Details  Name: Collin Gonzales MRN: [id number] Date of Birth: 18-Jun-1935  Transition of [facility]) CM/SW Contact  Shelbie Hutching, RN Phone Number: 10/29/2018, 12:17 PM  Clinical Narrative:    [facility] bed and patient has accepted bed.  [facility] has started insurance authorization.    Expected Discharge Plan: [place] Barriers to Discharge: Continued Medical Work up  Expected Discharge Plan and Services Expected Discharge Plan: [place] arrangements for the past 2 months: Single Family Home                                       Social Determinants of Health (SDOH) Interventions    Readmission Risk Interventions Readmission Risk Prevention Plan 10/28/2018  Transportation Screening Complete  Social Work Consult for [place] Planning/Counseling [place] Not Applicable  Medication Review Press photographer) Complete  Some recent data might be hidden

## 2018-10-29 NOTE — Progress Notes (Signed)
[place] [facility] [place] NAME: Collin Gonzales    MR#:  [id number]  DATE OF BIRTH:  08/12/1935  SUBJECTIVE:  CHIEF COMPLAINT:   Chief Complaint  Patient presents with  . Weakness   Fell down at home and could not get up and came with rhabdomyolysis.  Feels slightly better today. Have generalized weakness.  REVIEW OF SYSTEMS:  CONSTITUTIONAL: No fever, have fatigue or weakness.  EYES: No blurred or double vision.  EARS, NOSE, AND THROAT: No tinnitus or ear pain.  RESPIRATORY: No cough, shortness of breath, wheezing or hemoptysis.  CARDIOVASCULAR: No chest pain, orthopnea, edema.  GASTROINTESTINAL: No nausea, vomiting, diarrhea or abdominal pain.  GENITOURINARY: No dysuria, hematuria.  ENDOCRINE: No polyuria, nocturia,  HEMATOLOGY: No anemia, easy bruising or bleeding SKIN: No rash or lesion. MUSCULOSKELETAL: No joint pain or arthritis.   NEUROLOGIC: No tingling, numbness, weakness.  PSYCHIATRY: No anxiety or depression.   ROS  DRUG ALLERGIES:   Allergies  Allergen Reactions  . Plaquenil [Hydroxychloroquine] Other (See Comments)    Bad dreams  . Simvastatin Other (See Comments)    Bad dreams    VITALS:  Blood pressure 106/70, pulse 78, temperature 97.8 F (36.6 C), temperature source Oral, resp. rate 18, height 5\' 8"  (1.727 m), weight 50 kg, SpO2 97 %.  PHYSICAL EXAMINATION:  GENERAL:  83 y.o.-year-old patient lying in the bed with no acute distress.  EYES: Pupils equal, round, reactive to light and accommodation. No scleral icterus. Extraocular muscles intact.  HEENT: Head atraumatic, normocephalic. Oropharynx and nasopharynx clear.  NECK:  Supple, no jugular venous distention. No thyroid enlargement, no tenderness.  LUNGS: Normal breath sounds bilaterally, no wheezing, rales,rhonchi or crepitation. No use of accessory muscles of respiration.  CARDIOVASCULAR: S1, S2 normal. No murmurs, rubs, or gallops.  ABDOMEN: Soft,  nontender, nondistended. Bowel sounds present. No organomegaly or mass.  EXTREMITIES: No pedal edema, cyanosis, or clubbing.  NEUROLOGIC: Cranial nerves II through XII are intact. Muscle strength 4/5 in all extremities. Sensation intact. Gait not checked.  PSYCHIATRIC: The patient is alert and oriented x 3.  SKIN: No obvious rash, lesion, or ulcer.   Physical Exam LABORATORY PANEL:   CBC Recent Labs  Lab 10/26/18 2138  WBC 11.0*  HGB 11.7*  HCT 38.7*  PLT 449*   ------------------------------------------------------------------------------------------------------------------  Chemistries  Recent Labs  Lab 10/24/18 1102  10/29/18 0624  NA 139   < > 137  K 4.8   < > 3.6  CL 106   < > 108  CO2 25   < > 23  GLUCOSE 127*   < > 81  BUN 28*   < > 19  CREATININE 1.15   < > 0.78  CALCIUM 9.0   < > 8.3*  AST 18  --   --   ALT 9  --   --   ALKPHOS 61  --   --   BILITOT 0.4  --   --    < > = values in this interval not displayed.   ------------------------------------------------------------------------------------------------------------------  Cardiac Enzymes No results for input(s): TROPONINI in the last 168 hours. ------------------------------------------------------------------------------------------------------------------  RADIOLOGY:  Dg Chest 2 View  Result Date: 10/28/2018 CLINICAL DATA:  Hypoxia. Hx of urothelial carcinoma of bladder- 10/2017, stroke- 2000, prostate cancer- 2006, primary cancer of right lung- 2017, COPD, port a cath insertion- 04/2018. Former smoker. EXAM: CHEST - 2 VIEW COMPARISON:  10/26/2018 FINDINGS: Patient has a RIGHT-sided PowerPort. The heart size is  normal accounting for AP position. They are streaky opacity at the LEFT lung base, consistent with subsegmental atelectasis. Focal opacity is identified in the MEDIAL RIGHT lung base, stable in appearance and chronic. No new consolidation. Small pleural effusions. Lungs are hyperinflated. Perihilar  peribronchial thickening. IMPRESSION: 1. COPD and bronchitic changes. 2. Bibasilar atelectasis. 3. Lungs otherwise clear. Electronically Signed   By: Nolon Nations M.D.   On: 10/28/2018 11:19    ASSESSMENT AND PLAN:   Active Problems:   Rhabdomyolysis  1.  Rhabdomyolysis: No kidney injury as of yet.  Potassium within normal range.  Continue to follow CK.  Aggressive IV hydration with normal saline. CK is coming down. 2.  UTI: Present on admission.  Continue ceftriaxone, follow urine culture- multiple species.    We will give total 5 days of antibiotic course. 3.  Rheumatoid arthritis: Continue methotrexate 4.  Fall: Secondary to generalized weakness following chemo for safety 5.  Left hemiparesis: Sequela of remote stroke 6.  COPD: Stable; continue Ellipta.   7.  DVT prophylaxis: Lovenox 8.  GI prophylaxis: None 9.  Prostate cancer-follows with Dr. [name] and on chemotherapy.         There is a new mass/nodularity found on chest x-ray-I advised to continue follow-up with oncology clinic as outpatient.   All the records are reviewed and case discussed with Care Management/Social Workerr. Management plans discussed with the patient, family and they are in agreement.  CODE STATUS: DNR  TOTAL TIME TAKING CARE OF THIS PATIENT: 35 minutes.   Need SNF placement as per PT. Pt agrees with that.  POSSIBLE D/C IN 1-2 DAYS, DEPENDING ON CLINICAL CONDITION.   Vaughan Basta M.D on 10/29/2018   Between 7am to 6pm - Pager - [phone]  After 6pm go to [email or web address] - password [email or web address] [facility]  Office  [phone]  CC: Primary care physician; [name], MD  Note: This dictation was prepared with Dragon dictation along with smaller phrase technology. Any transcriptional errors that result from this process are unintentional.

## 2018-10-29 NOTE — TOC Progression Note (Signed)
Transition of Care [facility]) - Progression Note    Patient Details  Name: Collin Gonzales MRN: [id number] Date of Birth: April 06, 1935  Transition of Care [facility]) CM/SW Contact  Shelbie Hutching, RN Phone Number: 10/29/2018, 11:33 AM  Clinical Narrative:     Patient would like to go to [facility] as a first choice for rehab and [facility] as second choice.  RNCM waiting to see if [facility] can offer a bed.   Expected Discharge Plan: [place] Barriers to Discharge: Continued Medical Work up  Expected Discharge Plan and Services Expected Discharge Plan: [place] arrangements for the past 2 months: Single Family Home                                       Social Determinants of Health (SDOH) Interventions    Readmission Risk Interventions Readmission Risk Prevention Plan 10/28/2018  Transportation Screening Complete  Social Work Consult for [place] Planning/Counseling [place] Not Applicable  Medication Review Press photographer) Complete  Some recent data might be hidden

## 2018-10-30 DIAGNOSIS — C679 Malignant neoplasm of bladder, unspecified: Secondary | ICD-10-CM

## 2018-10-30 DIAGNOSIS — M2518 Fistula, other specified site: Secondary | ICD-10-CM

## 2018-10-30 MED ORDER — CEPHALEXIN 500 MG PO CAPS
500.0000 mg | ORAL_CAPSULE | Freq: Two times a day (BID) | ORAL | Status: DC
  Administered 2018-10-30 – 2018-10-31 (×3): 500 mg via ORAL
  Filled 2018-10-30 (×3): qty 1

## 2018-10-30 MED ORDER — CEPHALEXIN 500 MG PO CAPS: 500.0000 mg | ORAL_CAPSULE | Freq: Two times a day (BID) | ORAL | Status: DC

## 2018-10-30 MED ORDER — GERHARDT'S BUTT CREAM
TOPICAL_CREAM | Freq: Two times a day (BID) | CUTANEOUS | Status: DC
  Administered 2018-10-30 – 2018-10-31 (×2): via TOPICAL
  Filled 2018-10-30: qty 1

## 2018-10-30 MED ORDER — GERHARDT'S BUTT CREAM
TOPICAL_CREAM | Freq: Every day | CUTANEOUS | Status: DC
  Administered 2018-10-30: 14:00:00 via TOPICAL
  Filled 2018-10-30: qty 1

## 2018-10-30 NOTE — Consult Note (Signed)
Reason for consultation: History bladder cancer and possible fistula  Melbourne Jakubiak is an 83 y.o. 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  He was previously managed with a chronic indwelling Foley catheter that was removed back in February 2020.  He subsequently voided without problems and PVR was minimal.  He was last seen in our office in June 2020 and his PVR was minimal.  Due to comorbidities he is not a surgical candidate and conservative management of his fistula has been recommended.  He was admitted with rhabdomyolysis and suspected UTI.  Urine culture grew multiple organisms, none predominant.  He had a Foley catheter and states it was placed during this hospitalization.  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  As per prior evaluation by Dr. Erlene Quan he is not a surgical candidate and conservative therapy recommended.    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  - Discontinue Foley catheter when no longer needed for output monitoring  John Giovanni, MD

## 2018-10-30 NOTE — TOC Progression Note (Signed)
Transition of Care [facility]) - Progression Note    Patient Details  Name: Collin Gonzales MRN: [id number] Date of Birth: 1935-12-17  Transition of Care [facility]) CM/SW Contact  Shelbie Hutching, RN Phone Number: 10/30/2018, 2:51 PM  Clinical Narrative:    Authorization received from [facility].  Urology consult is pending.    Expected Discharge Plan: [place] Barriers to Discharge: Continued Medical Work up  Expected Discharge Plan and Services Expected Discharge Plan: [place] arrangements for the past 2 months: Single Family Home                                       Social Determinants of Health (SDOH) Interventions    Readmission Risk Interventions Readmission Risk Prevention Plan 10/28/2018  Transportation Screening Complete  Social Work Consult for [place] Planning/Counseling [place] Not Applicable  Medication Review Press photographer) Complete  Some recent data might be hidden

## 2018-10-30 NOTE — Plan of Care (Signed)
Rounded on pt - he's sitting up eating lunch.  States he has no pain or any needs at this time.  Asked him to call me if I could be of assistance.

## 2018-10-30 NOTE — Progress Notes (Addendum)
Initial Nutrition Assessment  DOCUMENTATION CODES:   Underweight  INTERVENTION:   Recommend Ensure Enlive po BID, each supplement provides 350 kcal and 20 grams of protein  Magic cup TID with meals, each supplement provides 290 kcal and 9 grams of protein  Recommend MVI daily   Liberalize diet   NUTRITION DIAGNOSIS:   Increased nutrient needs related to catabolic illness(COPD) as evidenced by increased estimated needs.  GOAL:   Patient will meet greater than or equal to 90% of their needs  MONITOR:   PO intake, Supplement acceptance, Labs, Weight trends, Skin, I & O's  REASON FOR ASSESSMENT:   Other (Comment)(Low BMI)    ASSESSMENT:   83 y/o male with h/o urothelial carcinoma, COPD, RA admitted with UTI, fall and rhabomyolysis  RD working remotely.  Pt with fairly good appetite and oral intake; pt eating 40-100% of meals. RD will add supplements and liberalize diet to help pt meet his estimated needs. Per chart, pt appears fairly weight stable pta.    Pt at high risk for malnutrition but unable to diagnose at this time as NFPE cannot be performed.   Medications reviewed and include: cephalexin, colace, lovenox, megace  Labs reviewed: CK 576(H)- 9/7  Unable to complete Nutrition-Focused physical exam at this time.   Diet Order:   Diet Order            Diet regular Room service appropriate? Yes; Fluid consistency: Thin  Diet effective now             EDUCATION NEEDS:   No education needs have been identified at this time  Skin:  Skin Assessment: Reviewed RN Assessment(ecchymosis, MASD)  Last BM:  9-7- type 7  Height:   Ht Readings from Last 1 Encounters:  10/26/18 5\' 8"  (1.727 m)    Weight:   Wt Readings from Last 1 Encounters:  10/30/18 50.5 kg    Ideal Body Weight:  70 kg  BMI:  Body mass index is 16.93 kg/m.  Estimated Nutritional Needs:   Kcal:  1600-1800kcal/day  Protein:  80-90g/day  Fluid:  >1.3L/day  Koleen Distance MS,  RD, LDN Pager #- [phone] Office#- [phone] After Hours Pager: [phone]

## 2018-10-30 NOTE — Plan of Care (Signed)
Pt has a fistula. Even though he has a foley he's leaking urine from the rectum.  Cleaned patient and applied Gerhardt's butt cream.

## 2018-10-30 NOTE — Care Management Important Message (Signed)
Important Message  Patient Details  Name: Collin Gonzales MRN: [id number] Date of Birth: May 06, 1935   Medicare Important Message Given:  Yes     Juliann Pulse A Lanice Folden 10/30/2018, 10:15 AM

## 2018-10-30 NOTE — Progress Notes (Signed)
[place] at [place] NAME: Collin Gonzales    MR#:  [id number]  DATE OF BIRTH:  11-02-1935  SUBJECTIVE:  CHIEF COMPLAINT:   Chief Complaint  Patient presents with  . Weakness   Fell down at home and could not get up and came with rhabdomyolysis.  The patient complains of generalized weakness.  Per RN, the patient has Foley catheter but still has urine leaking from rectum. REVIEW OF SYSTEMS:  CONSTITUTIONAL: No fever, have fatigue or weakness.  EYES: No blurred or double vision.  EARS, NOSE, AND THROAT: No tinnitus or ear pain.  RESPIRATORY: No cough, shortness of breath, wheezing or hemoptysis.  CARDIOVASCULAR: No chest pain, orthopnea, edema.  GASTROINTESTINAL: No nausea, vomiting, diarrhea or abdominal pain.  GENITOURINARY: No dysuria, hematuria.  ENDOCRINE: No polyuria, nocturia,  HEMATOLOGY: No anemia, easy bruising or bleeding SKIN: No rash or lesion. MUSCULOSKELETAL: No joint pain or arthritis.   NEUROLOGIC: No tingling, numbness, weakness.  PSYCHIATRY: No anxiety or depression.   ROS  DRUG ALLERGIES:   Allergies  Allergen Reactions  . Plaquenil [Hydroxychloroquine] Other (See Comments)    Bad dreams  . Simvastatin Other (See Comments)    Bad dreams    VITALS:  Blood pressure 131/74, pulse 82, temperature 98.2 F (36.8 C), resp. rate 19, height 5\' 8"  (1.727 m), weight 50.5 kg, SpO2 96 %.  PHYSICAL EXAMINATION:  GENERAL:  83 y.o.-year-old patient lying in the bed with no acute distress.  EYES: Pupils equal, round, reactive to light and accommodation. No scleral icterus. Extraocular muscles intact.  HEENT: Head atraumatic, normocephalic. Oropharynx and nasopharynx clear.  NECK:  Supple, no jugular venous distention. No thyroid enlargement, no tenderness.  LUNGS: Normal breath sounds bilaterally, no wheezing, rales,rhonchi or crepitation. No use of accessory muscles of respiration.  CARDIOVASCULAR: S1, S2 normal. No  murmurs, rubs, or gallops.  ABDOMEN: Soft, nontender, nondistended. Bowel sounds present. No organomegaly or mass.  EXTREMITIES: No pedal edema, cyanosis, or clubbing.  NEUROLOGIC: Cranial nerves II through XII are intact. Muscle strength 4/5 in all extremities. Sensation intact. Gait not checked.  PSYCHIATRIC: The patient is alert and oriented x 3.  SKIN: No obvious rash, lesion, or ulcer.   Physical Exam LABORATORY PANEL:   CBC Recent Labs  Lab 10/26/18 2138  WBC 11.0*  HGB 11.7*  HCT 38.7*  PLT 449*   ------------------------------------------------------------------------------------------------------------------  Chemistries  Recent Labs  Lab 10/24/18 1102  10/29/18 0624  NA 139   < > 137  K 4.8   < > 3.6  CL 106   < > 108  CO2 25   < > 23  GLUCOSE 127*   < > 81  BUN 28*   < > 19  CREATININE 1.15   < > 0.78  CALCIUM 9.0   < > 8.3*  AST 18  --   --   ALT 9  --   --   ALKPHOS 61  --   --   BILITOT 0.4  --   --    < > = values in this interval not displayed.   ------------------------------------------------------------------------------------------------------------------  Cardiac Enzymes No results for input(s): TROPONINI in the last 168 hours. ------------------------------------------------------------------------------------------------------------------  RADIOLOGY:  No results found.  ASSESSMENT AND PLAN:   Active Problems:   Rhabdomyolysis  1.  Rhabdomyolysis: No kidney injury as of yet.  Potassium within normal range.   Improving with normal saline. CK is coming down.  2.  UTI: Present on admission.  Continue ceftriaxone, follow urine culture- multiple species.    We will give total 5 days of antibiotic course.  Change to Keflex for 2 more days. 3.  Rheumatoid arthritis: Continue methotrexate 4.  Fall: Secondary to generalized weakness following chemo for safety 5.  Left hemiparesis: Sequela of remote stroke 6.  COPD: Stable; continue Ellipta.    7.  DVT prophylaxis: Lovenox 8.  GI prophylaxis: None 9.  Prostate cancer-follows with Dr. [name] and on chemotherapy.         There is a new mass/nodularity found on chest x-ray-I advised to continue follow-up with oncology clinic as outpatient.  H/O urothelial carcinoma of bladder, suspected fistula.  Urology consult.  All the records are reviewed and case discussed with Care Management/Social Workerr. Management plans discussed with the patient, family and they are in agreement.  CODE STATUS: DNR  TOTAL TIME TAKING CARE OF THIS PATIENT: 35 minutes.   Need SNF placement as per PT. Pt agrees with that.  POSSIBLE D/C IN 1-2 DAYS, DEPENDING ON CLINICAL CONDITION.   Demetrios Loll M.D on 10/30/2018   Between 7am to 6pm - Pager - [phone]  After 6pm go to [email or web address] - password [email or web address] [place] [facility]  Office  [phone]  CC: Primary care physician; [name], MD  Note: This dictation was prepared with Dragon dictation along with smaller phrase technology. Any transcriptional errors that result from this process are unintentional.

## 2018-10-31 DIAGNOSIS — K219 Gastro-esophageal reflux disease without esophagitis: Secondary | ICD-10-CM | POA: Diagnosis not present

## 2018-10-31 DIAGNOSIS — R1312 Dysphagia, oropharyngeal phase: Secondary | ICD-10-CM | POA: Diagnosis not present

## 2018-10-31 DIAGNOSIS — R300 Dysuria: Secondary | ICD-10-CM | POA: Diagnosis not present

## 2018-10-31 DIAGNOSIS — Z7401 Bed confinement status: Secondary | ICD-10-CM | POA: Diagnosis not present

## 2018-10-31 DIAGNOSIS — Z5112 Encounter for antineoplastic immunotherapy: Secondary | ICD-10-CM | POA: Diagnosis not present

## 2018-10-31 DIAGNOSIS — M2550 Pain in unspecified joint: Secondary | ICD-10-CM | POA: Diagnosis not present

## 2018-10-31 DIAGNOSIS — E46 Unspecified protein-calorie malnutrition: Secondary | ICD-10-CM | POA: Diagnosis not present

## 2018-10-31 DIAGNOSIS — R262 Difficulty in walking, not elsewhere classified: Secondary | ICD-10-CM | POA: Diagnosis not present

## 2018-10-31 DIAGNOSIS — M6282 Rhabdomyolysis: Secondary | ICD-10-CM | POA: Diagnosis not present

## 2018-10-31 DIAGNOSIS — W19XXXA Unspecified fall, initial encounter: Secondary | ICD-10-CM | POA: Diagnosis not present

## 2018-10-31 DIAGNOSIS — Z9079 Acquired absence of other genital organ(s): Secondary | ICD-10-CM | POA: Diagnosis not present

## 2018-10-31 DIAGNOSIS — R498 Other voice and resonance disorders: Secondary | ICD-10-CM | POA: Diagnosis not present

## 2018-10-31 DIAGNOSIS — E785 Hyperlipidemia, unspecified: Secondary | ICD-10-CM | POA: Diagnosis not present

## 2018-10-31 DIAGNOSIS — G4700 Insomnia, unspecified: Secondary | ICD-10-CM | POA: Diagnosis not present

## 2018-10-31 DIAGNOSIS — M6281 Muscle weakness (generalized): Secondary | ICD-10-CM | POA: Diagnosis not present

## 2018-10-31 DIAGNOSIS — C679 Malignant neoplasm of bladder, unspecified: Secondary | ICD-10-CM | POA: Diagnosis not present

## 2018-10-31 DIAGNOSIS — I69398 Other sequelae of cerebral infarction: Secondary | ICD-10-CM | POA: Diagnosis not present

## 2018-10-31 DIAGNOSIS — N390 Urinary tract infection, site not specified: Secondary | ICD-10-CM | POA: Diagnosis not present

## 2018-10-31 DIAGNOSIS — J441 Chronic obstructive pulmonary disease with (acute) exacerbation: Secondary | ICD-10-CM | POA: Diagnosis not present

## 2018-10-31 DIAGNOSIS — J449 Chronic obstructive pulmonary disease, unspecified: Secondary | ICD-10-CM | POA: Diagnosis not present

## 2018-10-31 DIAGNOSIS — M059 Rheumatoid arthritis with rheumatoid factor, unspecified: Secondary | ICD-10-CM | POA: Diagnosis not present

## 2018-10-31 DIAGNOSIS — R339 Retention of urine, unspecified: Secondary | ICD-10-CM | POA: Diagnosis not present

## 2018-10-31 DIAGNOSIS — M069 Rheumatoid arthritis, unspecified: Secondary | ICD-10-CM | POA: Diagnosis not present

## 2018-10-31 DIAGNOSIS — M0580 Other rheumatoid arthritis with rheumatoid factor of unspecified site: Secondary | ICD-10-CM | POA: Diagnosis not present

## 2018-10-31 DIAGNOSIS — Z8546 Personal history of malignant neoplasm of prostate: Secondary | ICD-10-CM | POA: Diagnosis not present

## 2018-10-31 DIAGNOSIS — Z79899 Other long term (current) drug therapy: Secondary | ICD-10-CM | POA: Diagnosis not present

## 2018-10-31 DIAGNOSIS — R5381 Other malaise: Secondary | ICD-10-CM | POA: Diagnosis not present

## 2018-10-31 DIAGNOSIS — D649 Anemia, unspecified: Secondary | ICD-10-CM | POA: Diagnosis not present

## 2018-10-31 DIAGNOSIS — N189 Chronic kidney disease, unspecified: Secondary | ICD-10-CM | POA: Diagnosis not present

## 2018-10-31 DIAGNOSIS — I69354 Hemiplegia and hemiparesis following cerebral infarction affecting left non-dominant side: Secondary | ICD-10-CM | POA: Diagnosis not present

## 2018-10-31 DIAGNOSIS — F5101 Primary insomnia: Secondary | ICD-10-CM | POA: Diagnosis not present

## 2018-10-31 DIAGNOSIS — Z8673 Personal history of transient ischemic attack (TIA), and cerebral infarction without residual deficits: Secondary | ICD-10-CM | POA: Diagnosis not present

## 2018-10-31 DIAGNOSIS — Z1159 Encounter for screening for other viral diseases: Secondary | ICD-10-CM | POA: Diagnosis not present

## 2018-10-31 DIAGNOSIS — Z87891 Personal history of nicotine dependence: Secondary | ICD-10-CM | POA: Diagnosis not present

## 2018-10-31 LAB — SARS CORONAVIRUS 2 (TAT 6-24 HRS): SARS Coronavirus 2: NEGATIVE

## 2018-10-31 LAB — SARS CORONAVIRUS 2 BY RT PCR (HOSPITAL ORDER, PERFORMED IN ~~LOC~~ HOSPITAL LAB): SARS Coronavirus 2: NEGATIVE

## 2018-10-31 MED ORDER — BISACODYL 10 MG RE SUPP: 10.0000 mg | Freq: Every day | RECTAL | Status: DC

## 2018-10-31 NOTE — TOC Transition Note (Signed)
Transition of Care [facility]) - CM/SW Discharge Note   Patient Details  Name: Collin Gonzales MRN: [id number] Date of Birth: Jun 24, 1935  Transition of Care [facility]) CM/SW Contact:  Shelbie Hutching, RN Phone Number: 10/31/2018, 12:15 PM   Clinical Narrative:    Patient ready for discharge today and will go to [facility] as soon as Covid comes back negative.  Patient will go to room 706, bedside RN will call report to [phone].   [facility] will provide transport to facility.    Final next level of care: Skilled Nursing Facility Barriers to Discharge: Barriers Resolved   Patient Goals and CMS Choice   CMS Medicare.gov Compare Post Acute Care list provided to:: Patient Choice offered to / list presented to : Patient  Discharge Placement                Patient to be transferred to facility by: [facility] Name of family member notified: Daughter's will be notified before transport Patient and family notified of of transfer: 10/31/18  Discharge Plan and Services                                     Social Determinants of Health ([place]) Interventions     Readmission Risk Interventions Readmission Risk Prevention Plan 10/28/2018  Transportation Screening Complete  Social Work Consult for [place] Planning/Counseling [place] Not Applicable  Medication Review Press photographer) Complete  Some recent data might be hidden

## 2018-10-31 NOTE — Discharge Summary (Signed)
[name] NAME: Collin Gonzales    MR#:  [id number]  DATE OF BIRTH:  05-24-1935  DATE OF ADMISSION:  10/26/2018   ADMITTING PHYSICIAN: Collin Foreman, MD  DATE OF DISCHARGE: 10/31/2018  PRIMARY CARE PHYSICIAN: Collin Sizer, MD   ADMISSION DIAGNOSIS:  Dehydration [E86.0] Acute cystitis with hematuria [N30.01] Generalized weakness [R53.1] Near syncope [R55] Non-traumatic rhabdomyolysis [M62.82] DISCHARGE DIAGNOSIS:  Active Problems:   Rhabdomyolysis  SECONDARY DIAGNOSIS:   Past Medical History:  Diagnosis Date  . Arthritis   . Collagen vascular disease (HCC)    rheumatoid arthritis  . COPD (chronic obstructive pulmonary disease) ([place])   . GERD (gastroesophageal reflux disease)   . History of prostate cancer   . HLD (hyperlipidemia)   . HOH (hard of hearing)   . Insomnia   . Oxygen deficit    2L HS AND PRN  . Primary cancer of right lung ([place]) 2017   rad tx's.  . Prostate cancer ([place]) 2006   Rad seed tx's.  . Stroke ([place])    2000  . Urothelial carcinoma of bladder ([place]) 10/2017   TURP to Prostate and chemo tx's.   HOSPITAL COURSE:   Rhabdomyolysis  1. Rhabdomyolysis: No kidney injury as of yet. Potassium within normal range.  Improving with normal saline.  2. UTI: Present on admission. He was on ceftriaxone, follow urine culture- multiple species. Changed to Keflex. Discontinue abx per Dr. Dene Gonzales recommendation. 3. Rheumatoid arthritis: Continue methotrexate 4. Fall: Secondary to generalized weakness following chemo for safety 5. Left hemiparesis: Sequela of remote stroke 6. COPD: Stable; continue Ellipta.  7. DVT prophylaxis: Lovenox 8. GI prophylaxis: None 9.  Prostate cancer-follows with Dr. Grayland Gonzales and on chemotherapy.         There is a new mass/nodularity found on chest x-ray-patient was advised to continue follow-up with oncology clinic as outpatient.  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  Per Dr. Bernardo Gonzales, not a surgical candidate and conservative therapy recommended.  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  DISCHARGE CONDITIONS:   CONSULTS OBTAINED:  Treatment Team:  Collin Sons, MD DRUG ALLERGIES:   Allergies  Allergen Reactions  . Plaquenil [Hydroxychloroquine] Other (See Comments)    Bad dreams  . Simvastatin Other (See Comments)    Bad dreams   DISCHARGE MEDICATIONS:   Allergies as of 10/31/2018      Reactions   Plaquenil [hydroxychloroquine] Other (See Comments)   Bad dreams   Simvastatin Other (See Comments)   Bad dreams      Medication List    STOP taking these medications   Oxycodone HCl 10 MG Tabs     TAKE these medications   megestrol 20 MG tablet Commonly known as: MEGACE Take 1 tablet (20 mg total) by mouth 2 (two) times daily.   methotrexate 2.5 MG tablet Commonly known as: RHEUMATREX Take 4 tablets by mouth every Thursday.   OXYGEN Place 2 L into the nose daily as needed (shortness of breath).   QUEtiapine 50 MG tablet Commonly known as: SEROQUEL Take 1 tablet (50 mg total) by mouth at bedtime.   Trelegy Ellipta 100-62.5-25 MCG/INH Aepb Generic drug: Fluticasone-Umeclidin-Vilant Inhale 1 puff into the lungs daily.        DISCHARGE INSTRUCTIONS:  See AVS.  If you experience worsening of your admission symptoms, develop shortness of  breath, life threatening emergency, suicidal or homicidal thoughts you must seek medical attention immediately by calling 911 or calling your MD immediately  if symptoms less severe.  You Must read complete instructions/literature along with all the possible adverse reactions/side effects for all the Medicines you take and that have been prescribed to you. Take any new Medicines after you have completely understood and accpet all the possible  adverse reactions/side effects.   Please note  You were cared for by a hospitalist during your hospital stay. If you have any questions about your discharge medications or the care you received while you were in the hospital after you are discharged, you can call the unit and asked to speak with the hospitalist on call if the hospitalist that took care of you is not available. Once you are discharged, your primary care physician will handle any further medical issues. Please note that NO REFILLS for any discharge medications will be authorized once you are discharged, as it is imperative that you return to your primary care physician (or establish a relationship with a primary care physician if you do not have one) for your aftercare needs so that they can reassess your need for medications and monitor your lab values.    On the day of Discharge:  VITAL SIGNS:  Blood pressure 109/64, pulse 78, temperature 97.9 F (36.6 C), resp. rate 15, height 5\' 8"  (1.727 m), weight 50.5 kg, SpO2 95 %. PHYSICAL EXAMINATION:  GENERAL:  83 y.o.-year-old patient lying in the bed with no acute distress.  EYES: Pupils equal, round, reactive to light and accommodation. No scleral icterus. Extraocular muscles intact.  HEENT: Head atraumatic, normocephalic. Oropharynx and nasopharynx clear.  NECK:  Supple, no jugular venous distention. No thyroid enlargement, no tenderness.  LUNGS: Normal breath sounds bilaterally, no wheezing, rales,rhonchi or crepitation. No use of accessory muscles of respiration.  CARDIOVASCULAR: S1, S2 normal. No murmurs, rubs, or gallops.  ABDOMEN: Soft, non-tender, non-distended. Bowel sounds present. No organomegaly or mass.  EXTREMITIES: No pedal edema, cyanosis, or clubbing.  NEUROLOGIC: Cranial nerves II through XII are intact. Muscle strength 4/5 in all extremities. Sensation intact. Gait not checked.  PSYCHIATRIC: The patient is alert and oriented x 3.  SKIN: No obvious rash, lesion, or  ulcer.  DATA REVIEW:   CBC Recent Labs  Lab 10/26/18 2138  WBC 11.0*  HGB 11.7*  HCT 38.7*  PLT 449*    Chemistries  Recent Labs  Lab 10/24/18 1102  10/29/18 0624  NA 139   < > 137  K 4.8   < > 3.6  CL 106   < > 108  CO2 25   < > 23  GLUCOSE 127*   < > 81  BUN 28*   < > 19  CREATININE 1.15   < > 0.78  CALCIUM 9.0   < > 8.3*  AST 18  --   --   ALT 9  --   --   ALKPHOS 61  --   --   BILITOT 0.4  --   --    < > = values in this interval not displayed.     Microbiology Results  Results for orders placed or performed during the hospital encounter of 10/26/18  Urine Culture     Status: Abnormal   Collection Time: 10/26/18 11:10 PM   Specimen: Urine, Random  Result Value Ref Range Status   Specimen Description   Final    URINE, RANDOM Performed at [facility]  Lab, [address], [place], [place] [address]    Special Requests   Final    NONE Performed at [facility], [address] [place], [place] [address]    Culture (A)  Final    >=100,000 COLONIES/mL MULTIPLE SPECIES PRESENT, SUGGEST RECOLLECTION   Report Status 10/28/2018 FINAL  Final  SARS CORONAVIRUS 2 (TAT 6-24 HRS) Nasopharyngeal Nasopharyngeal Swab     Status: None   Collection Time: 10/27/18  1:33 AM   Specimen: Nasopharyngeal Swab  Result Value Ref Range Status   SARS Coronavirus 2 NEGATIVE NEGATIVE Final    Comment: (NOTE) SARS-CoV-2 target nucleic acids are NOT DETECTED. The SARS-CoV-2 RNA is generally detectable in upper and lower respiratory specimens during the acute phase of infection. Negative results do not preclude SARS-CoV-2 infection, do not rule out co-infections with other pathogens, and should not be used as the sole basis for treatment or other patient management decisions. Negative results must be combined with clinical observations, patient history, and epidemiological information. The expected result is Negative. Fact Sheet for Patients:  SugarRoll.be Fact Sheet for Healthcare Providers: https://www.woods-mathews.com/ This test is not yet approved or cleared by the [place] FDA and  has been authorized for detection and/or diagnosis of SARS-CoV-2 by FDA under an Emergency Use Authorization (EUA). This EUA will remain  in effect (meaning this test can be used) for the duration of the COVID-19 declaration under Section 56 4(b)(1) of the Act, 21 U.S.C. section 360bbb-3(b)(1), unless the authorization is terminated or revoked sooner. Performed at [facility], [place] [address], [place], [place] [address]     RADIOLOGY:  No results found.   Management plans discussed with the patient, family and they are in agreement.  CODE STATUS: DNR   TOTAL TIME TAKING CARE OF THIS PATIENT: 33 minutes.    Demetrios Loll M.D on 10/31/2018 at 7:26 AM  Between 7am to 6pm - Pager - [phone]  After 6pm go to [email or web address] - Technical brewer [facility]  Office  [phone]  CC: Primary care physician; Collin Sizer, MD   Note: This dictation was prepared with Dragon dictation along with smaller phrase technology. Any transcriptional errors that result from this process are unintentional.

## 2018-10-31 NOTE — Progress Notes (Signed)
PT Cancellation Note  Patient Details Name: Collin Gonzales MRN: [id number] DOB: 10-Nov-1935   Cancelled Treatment:    Reason Eval/Treat Not Completed: Other (comment); Pt currently on in-room restrictions secondary to recent covid 19 test and is scheduled for discharge to SNF this date.  Will see pt tomorrow if discharge is postponed.    Linus Salmons PT, DPT 10/31/18, 4:28 PM

## 2018-10-31 NOTE — Plan of Care (Addendum)
Pt is d/cing to [facility], Rm 706.  COVID test came back negative.  Called report to [facility] at [phone].  EMS called for transport.  Pt requested that his port remain accessed.  I called Altha Harm back to make sure that was ok and she was ok with that.

## 2018-11-03 DIAGNOSIS — M6281 Muscle weakness (generalized): Secondary | ICD-10-CM | POA: Diagnosis not present

## 2018-11-03 DIAGNOSIS — J449 Chronic obstructive pulmonary disease, unspecified: Secondary | ICD-10-CM | POA: Diagnosis not present

## 2018-11-03 DIAGNOSIS — M059 Rheumatoid arthritis with rheumatoid factor, unspecified: Secondary | ICD-10-CM | POA: Diagnosis not present

## 2018-11-03 DIAGNOSIS — M6282 Rhabdomyolysis: Secondary | ICD-10-CM | POA: Diagnosis not present

## 2018-11-08 DIAGNOSIS — J449 Chronic obstructive pulmonary disease, unspecified: Secondary | ICD-10-CM | POA: Diagnosis not present

## 2018-11-08 DIAGNOSIS — M6281 Muscle weakness (generalized): Secondary | ICD-10-CM | POA: Diagnosis not present

## 2018-11-08 DIAGNOSIS — G4700 Insomnia, unspecified: Secondary | ICD-10-CM | POA: Diagnosis not present

## 2018-11-08 DIAGNOSIS — M059 Rheumatoid arthritis with rheumatoid factor, unspecified: Secondary | ICD-10-CM | POA: Diagnosis not present

## 2018-11-08 NOTE — Progress Notes (Signed)
[facility]  [phone] [phone]  ID: Collin Gonzales OB: 10/03/1935  MR#: [id number]  KGU#:[id number]  Patient Care Team: [name], MD as PCP - General (Family Medicine) [name], MD as Referring Physician (Ophthalmology) [name], MD as Consulting Physician (Rheumatology) [name], MD as Referring Physician (Specialist) [name], MD as Consulting Physician (Urology) [name], MD as Referring Physician (Radiation Oncology)  CHIEF COMPLAINT: Locally advanced high-grade urothelial carcinoma.    INTERVAL HISTORY: Patient returns to clinic today for further evaluation and consideration of cycle 16 of Tecentriq.  He was recently in the hospital and was admitted to rehab for several weeks, but hopes to be discharged this coming Tuesday.  He continues to have pelvic pain, but does not wish a change in his narcotics.  He continues to have incontinence secondary to his known fistula.  He has no neurologic complaints.  He denies any recent fevers or illnesses.  He has chronic shortness of breath, but denies any chest pain, hemoptysis, or cough.  He has no nausea, vomiting, constipation, or diarrhea.  He denies any hematuria.  Patient offers no further specific complaints today.  REVIEW OF SYSTEMS:   Review of Systems  Constitutional: Negative.  Negative for fever, malaise/fatigue and weight loss.  Respiratory: Positive for shortness of breath. Negative for cough and hemoptysis.   Cardiovascular: Negative.  Negative for chest pain and leg swelling.  Gastrointestinal: Negative.  Negative for abdominal pain, blood in stool and melena.  Genitourinary: Positive for frequency and urgency. Negative for dysuria and hematuria.       Pelvic pain  Musculoskeletal: Negative.  Negative for back pain.  Skin: Negative.  Negative for rash.  Neurological: Negative.  Negative for sensory change, focal weakness,  weakness and headaches.  Psychiatric/Behavioral: Negative.  The patient is not nervous/anxious.     As per HPI. Otherwise, a complete review of systems is negative.  PAST MEDICAL HISTORY: Past Medical History:  Diagnosis Date  . Arthritis   . Collagen vascular disease (HCC)    rheumatoid arthritis  . COPD (chronic obstructive pulmonary disease) ([place])   . GERD (gastroesophageal reflux disease)   . History of prostate cancer   . HLD (hyperlipidemia)   . HOH (hard of hearing)   . Insomnia   . Oxygen deficit    2L HS AND PRN  . Primary cancer of right lung ([place]) 2017   rad tx's.  . Prostate cancer ([place]) 2006   Rad seed tx's.  . Stroke ([place])    2000  . Urothelial carcinoma of bladder ([place]) 10/2017   TURP to Prostate and chemo tx's.    PAST SURGICAL HISTORY: Past Surgical History:  Procedure Laterality Date  . APPENDECTOMY  1965  . CATARACT EXTRACTION W/PHACO Right 01/05/2016   Procedure: CATARACT EXTRACTION PHACO AND INTRAOCULAR LENS PLACEMENT (IOC);  Surgeon: [name], MD;  Location: [facility] ORS;  Service: Ophthalmology;  Laterality: Right;  [place] 1.14 AP% 18.1 CDE 13.44 FLUID PACK LOT # [id number] H  . CYSTOSCOPY W/ RETROGRADES Bilateral 11/01/2017   Procedure: CYSTOSCOPY WITH RETROGRADE PYELOGRAM;  Surgeon: Hollice Espy, MD;  Location: [facility] ORS;  Service: Urology;  Laterality: Bilateral;  . ENDOBRONCHIAL ULTRASOUND N/A 02/26/2015   Procedure: ENDOBRONCHIAL ULTRASOUND;  Surgeon: Flora Lipps, MD;  Location: [facility] ORS;  Service: Cardiopulmonary;  Laterality: N/A;  . [place]  . INSERTION PROSTATE RADIATION SEED  2002  . PORTA CATH INSERTION N/A 05/14/2018   Procedure: PORTA CATH  INSERTION;  Surgeon: Algernon Huxley, MD;  Location: [facility];  Service: Cardiovascular;  Laterality: N/A;  . TRANSURETHRAL RESECTION OF PROSTATE N/A 11/01/2017   Procedure: TRANSURETHRAL RESECTION OF THE PROSTATE (TURP) (channel TURP);  Surgeon: Hollice Espy, MD;  Location: [facility]  ORS;  Service: Urology;  Laterality: N/A;    FAMILY HISTORY: Family History  Problem Relation Age of Onset  . Cancer Mother   . Cancer Father        Lung Cancer  . Cancer Sister        breast  . Cancer Brother        stomach    ADVANCED DIRECTIVES (Y/N):  N  HEALTH MAINTENANCE: Social History   Tobacco Use  . Smoking status: Former Smoker    Packs/day: 0.50    Years: 50.00    Pack years: 25.00    Types: Cigarettes    Quit date: 02/21/2013    Years since quitting: 5.7  . Smokeless tobacco: Never Used  . Tobacco comment: smoking cessation materials not required  Substance Use Topics  . Alcohol use: No    Alcohol/week: 0.0 standard drinks  . Drug use: No     Colonoscopy:  PAP:  Bone density:  Lipid panel:  Allergies  Allergen Reactions  . Plaquenil [Hydroxychloroquine] Other (See Comments)    Bad dreams  . Simvastatin Other (See Comments)    Bad dreams    Current Outpatient Medications  Medication Sig Dispense Refill  . Fluticasone-Umeclidin-Vilant (TRELEGY ELLIPTA) 100-62.5-25 MCG/INH AEPB Inhale 1 puff into the lungs daily. 60 each 5  . megestrol (MEGACE) 20 MG tablet Take 1 tablet (20 mg total) by mouth 2 (two) times daily. 180 tablet 1  . methotrexate (RHEUMATREX) 2.5 MG tablet Take 4 tablets by mouth every Thursday.     . OXYGEN Place 2 L into the nose daily as needed (shortness of breath).     . QUEtiapine (SEROQUEL) 50 MG tablet Take 1 tablet (50 mg total) by mouth at bedtime. 90 tablet 0   No current facility-administered medications for this visit.    Facility-Administered Medications Ordered in Other Visits  Medication Dose Route Frequency Provider Last Rate Last Dose  . 0.9 %  sodium chloride infusion   Intravenous Once Lloyd Huger, MD      . [facility]) 1,200 mg in sodium chloride 0.9 % 250 mL chemo infusion  1,200 mg Intravenous Once Lloyd Huger, MD      . heparin lock flush 100 unit/mL  500 Units Intracatheter Once  PRN Lloyd Huger, MD        OBJECTIVE: Vitals:   11/15/18 1000  BP: 92/67  Pulse: 98  Resp: 16  Temp: (!) 96.4 F (35.8 C)     Body mass index is 15.46 kg/m.    ECOG FS:0 - Asymptomatic  General: Thin, no acute distress.  Sitting in a wheelchair. Eyes: Pink conjunctiva, anicteric sclera. HEENT: Normocephalic, moist mucous membranes. Lungs: Clear to auscultation bilaterally. Heart: Regular rate and rhythm. No rubs, murmurs, or gallops. Abdomen: Soft, nontender, nondistended. No organomegaly noted, normoactive bowel sounds. Musculoskeletal: No edema, cyanosis, or clubbing. Neuro: Alert, answering all questions appropriately. Cranial nerves grossly intact. Skin: No rashes or petechiae noted. Psych: Normal affect.  LAB RESULTS:  Lab Results  Component Value Date   NA 137 11/15/2018   K 4.3 11/15/2018   CL 105 11/15/2018   CO2 25 11/15/2018   GLUCOSE 114 (H) 11/15/2018   BUN 38 (  H) 11/15/2018   CREATININE 1.18 11/15/2018   CALCIUM 9.5 11/15/2018   PROT 9.3 (H) 11/15/2018   ALBUMIN 2.6 (L) 11/15/2018   AST 20 11/15/2018   ALT 22 11/15/2018   ALKPHOS 50 11/15/2018   BILITOT 0.3 11/15/2018   GFRNONAA 58 (L) 11/15/2018   GFRAA >60 11/15/2018    Lab Results  Component Value Date   WBC 8.4 11/15/2018   NEUTROABS 6.5 11/15/2018   HGB 9.2 (L) 11/15/2018   HCT 31.3 (L) 11/15/2018   MCV 94.8 11/15/2018   PLT 393 11/15/2018     STUDIES: Dg Chest 2 View  Result Date: 10/28/2018 CLINICAL DATA:  Hypoxia. Hx of urothelial carcinoma of bladder- 10/2017, stroke- 2000, prostate cancer- 2006, primary cancer of right lung- 2017, COPD, port a cath insertion- 04/2018. Former smoker. EXAM: CHEST - 2 VIEW COMPARISON:  10/26/2018 FINDINGS: Patient has a RIGHT-sided PowerPort. The heart size is normal accounting for AP position. They are streaky opacity at the LEFT lung base, consistent with subsegmental atelectasis. Focal opacity is identified in the MEDIAL RIGHT lung base,  stable in appearance and chronic. No new consolidation. Small pleural effusions. Lungs are hyperinflated. Perihilar peribronchial thickening. IMPRESSION: 1. COPD and bronchitic changes. 2. Bibasilar atelectasis. 3. Lungs otherwise clear. Electronically Signed   By: Nolon Nations M.D.   On: 10/28/2018 11:19   Dg Chest 2 View  Result Date: 10/26/2018 CLINICAL DATA:  Weakness EXAM: CHEST - 2 VIEW COMPARISON:  CT chest September 19, 2018 FINDINGS: Excessive centrilobular emphysematous changes seen throughout both lungs. Unchanged wedge-shaped area of scarring right midlung. There is a 1.7 cm nodular opacity projecting over the left mid lung which was not clearly identified on the prior exam. The cardiomediastinal silhouette is unchanged. No acute osseous abnormality. A right-sided MediPort catheter seen with tip at the superior cavoatrial junction. IMPRESSION: 1. 1.7 cm nodular opacity within the left mid lung which was not seen on the recent prior exam. If further evaluation is required, given the patient's history would recommend CT. 2. Extensive centrilobular emphysematous changes with scarring in the right midlung. Electronically Signed   By: Prudencio Pair M.D.   On: 10/26/2018 23:00    ASSESSMENT: Locally advanced high-grade urothelial carcinoma.  PLAN:    1. Locally advanced high-grade urothelial carcinoma: Case discussed with urology, radiation oncology, as well as multidisciplinary tumor board.  Patient is not a surgical candidate nor can he receive additional XRT given his history of prostate cancer and brachii therapy seed placement.  Though he has a decent performance status, patient is frail and likely would not tolerate cisplatin based therapy.  CT scan results from September 19, 2018 reviewed independently with no obvious evidence of recurrent or progressive disease outside patient's bladder. Previously, hospice and end-of-life were discussed but patient is not interested at this time.  Despite evidence  of fistula, patient is not a surgical candidate therefore will continue to monitor with routine CT scans.  Proceed with cycle 16 of Tecentriq today.  Return to clinic in 3 weeks for further evaluation and consideration of cycle 17.  Will reimage in mid October at the conclusion of cycle 17.  2.  Right upper lobe lung mass: Previously, although biopsy was negative, patient had a positive PET scan that was highly suspicious for underlying malignancy. He underwent SBRT in approximately May 2017.   3.  Shortness of breath: Chronic and unchanged.  Patient now requires oxygen 24 hours/day although is admittedly noncompliant. 4.  Anemia: Chronic and unchanged.  Patient's hemoglobin has decreased slightly to 9.2.   5.  Hematuria: Patient does not complain of this today.  Appreciate urology input. 6.  Renal insufficiency: Resolved. 7.  Leukocytosis: Resolved. 8.  Pain: Continue oxycodone 10 mg every 4 hours as needed.  Patient did not wish a change in his narcotic regimen at this time.  Patient expressed understanding and was in agreement with this plan. He also understands that He can call clinic at any time with any questions, concerns, or complaints.    Lloyd Huger, MD   11/15/2018 10:36 AM

## 2018-11-14 ENCOUNTER — Telehealth: Payer: Self-pay

## 2018-11-14 NOTE — Telephone Encounter (Signed)
Pre-visit assessment attempted prior to appointment on 11/15/2018 with Dr. [name]. Pt spoke with me briefly stating that he wasn't feeling too good recently. He said that he was at a rehab appointment and couldn't talk on the phone. Appointment time was confirmed.

## 2018-11-14 NOTE — Progress Notes (Signed)
Called patient here today for follow up.  Patient denies any nausea, vomiting, diarrhea, constipation, SOB or hematuria.   Patient c/o dysuria.

## 2018-11-15 ENCOUNTER — Other Ambulatory Visit: Payer: Self-pay

## 2018-11-15 ENCOUNTER — Inpatient Hospital Stay: Payer: Medicare Other | Admitting: *Deleted

## 2018-11-15 ENCOUNTER — Inpatient Hospital Stay: Payer: Medicare Other

## 2018-11-15 ENCOUNTER — Encounter: Payer: Self-pay | Admitting: Oncology

## 2018-11-15 ENCOUNTER — Inpatient Hospital Stay (HOSPITAL_BASED_OUTPATIENT_CLINIC_OR_DEPARTMENT_OTHER): Payer: Medicare Other | Admitting: Oncology

## 2018-11-15 VITALS — BP 92/67 | HR 98 | Temp 96.4°F | Resp 16 | Wt 101.7 lb

## 2018-11-15 DIAGNOSIS — C679 Malignant neoplasm of bladder, unspecified: Secondary | ICD-10-CM

## 2018-11-15 DIAGNOSIS — M069 Rheumatoid arthritis, unspecified: Secondary | ICD-10-CM | POA: Diagnosis not present

## 2018-11-15 DIAGNOSIS — Z79899 Other long term (current) drug therapy: Secondary | ICD-10-CM | POA: Diagnosis not present

## 2018-11-15 DIAGNOSIS — E785 Hyperlipidemia, unspecified: Secondary | ICD-10-CM | POA: Diagnosis not present

## 2018-11-15 DIAGNOSIS — Z87891 Personal history of nicotine dependence: Secondary | ICD-10-CM | POA: Diagnosis not present

## 2018-11-15 DIAGNOSIS — Z5112 Encounter for antineoplastic immunotherapy: Secondary | ICD-10-CM | POA: Diagnosis not present

## 2018-11-15 DIAGNOSIS — Z95828 Presence of other vascular implants and grafts: Secondary | ICD-10-CM

## 2018-11-15 DIAGNOSIS — Z8673 Personal history of transient ischemic attack (TIA), and cerebral infarction without residual deficits: Secondary | ICD-10-CM | POA: Diagnosis not present

## 2018-11-15 DIAGNOSIS — J449 Chronic obstructive pulmonary disease, unspecified: Secondary | ICD-10-CM | POA: Diagnosis not present

## 2018-11-15 DIAGNOSIS — R300 Dysuria: Secondary | ICD-10-CM | POA: Diagnosis not present

## 2018-11-15 DIAGNOSIS — Z9079 Acquired absence of other genital organ(s): Secondary | ICD-10-CM | POA: Diagnosis not present

## 2018-11-15 DIAGNOSIS — Z8546 Personal history of malignant neoplasm of prostate: Secondary | ICD-10-CM | POA: Diagnosis not present

## 2018-11-15 DIAGNOSIS — D649 Anemia, unspecified: Secondary | ICD-10-CM | POA: Diagnosis not present

## 2018-11-15 LAB — CBC WITH DIFFERENTIAL/PLATELET
Abs Immature Granulocytes: 0.05 10*3/uL (ref 0.00–0.07)
Basophils Absolute: 0 10*3/uL (ref 0.0–0.1)
Basophils Relative: 1 %
Eosinophils Absolute: 0.4 10*3/uL (ref 0.0–0.5)
Eosinophils Relative: 4 %
HCT: 31.3 % — ABNORMAL LOW (ref 39.0–52.0)
Hemoglobin: 9.2 g/dL — ABNORMAL LOW (ref 13.0–17.0)
Immature Granulocytes: 1 %
Lymphocytes Relative: 10 %
Lymphs Abs: 0.8 10*3/uL (ref 0.7–4.0)
MCH: 27.9 pg (ref 26.0–34.0)
MCHC: 29.4 g/dL — ABNORMAL LOW (ref 30.0–36.0)
MCV: 94.8 fL (ref 80.0–100.0)
Monocytes Absolute: 0.7 10*3/uL (ref 0.1–1.0)
Monocytes Relative: 8 %
Neutro Abs: 6.5 10*3/uL (ref 1.7–7.7)
Neutrophils Relative %: 76 %
Platelets: 393 10*3/uL (ref 150–400)
RBC: 3.3 MIL/uL — ABNORMAL LOW (ref 4.22–5.81)
RDW: 15.9 % — ABNORMAL HIGH (ref 11.5–15.5)
WBC: 8.4 10*3/uL (ref 4.0–10.5)
nRBC: 0 % (ref 0.0–0.2)

## 2018-11-15 LAB — COMPREHENSIVE METABOLIC PANEL
ALT: 22 U/L (ref 0–44)
AST: 20 U/L (ref 15–41)
Albumin: 2.6 g/dL — ABNORMAL LOW (ref 3.5–5.0)
Alkaline Phosphatase: 50 U/L (ref 38–126)
Anion gap: 7 (ref 5–15)
BUN: 38 mg/dL — ABNORMAL HIGH (ref 8–23)
CO2: 25 mmol/L (ref 22–32)
Calcium: 9.5 mg/dL (ref 8.9–10.3)
Chloride: 105 mmol/L (ref 98–111)
Creatinine, Ser: 1.18 mg/dL (ref 0.61–1.24)
GFR calc Af Amer: >60 mL/min (ref >60)
GFR calc non Af Amer: 58 mL/min — ABNORMAL LOW (ref >60)
Glucose, Bld: 114 mg/dL — ABNORMAL HIGH (ref 70–99)
Potassium: 4.3 mmol/L (ref 3.5–5.1)
Sodium: 137 mmol/L (ref 135–145)
Total Bilirubin: 0.3 mg/dL (ref 0.3–1.2)
Total Protein: 9.3 g/dL — ABNORMAL HIGH (ref 6.5–8.1)

## 2018-11-15 MED ORDER — HEPARIN SOD (PORK) LOCK FLUSH 100 UNIT/ML IV SOLN
500.0000 [IU] | Freq: Once | INTRAVENOUS | Status: AC | PRN
  Administered 2018-11-15: 500 [IU]
  Filled 2018-11-15: qty 5

## 2018-11-15 MED ORDER — SODIUM CHLORIDE 0.9 % IV SOLN
1200.0000 mg | Freq: Once | INTRAVENOUS | Status: AC
  Administered 2018-11-15: 1200 mg via INTRAVENOUS
  Filled 2018-11-15: qty 20

## 2018-11-15 MED ORDER — SODIUM CHLORIDE 0.9% FLUSH
10.0000 mL | Freq: Once | INTRAVENOUS | Status: AC
  Administered 2018-11-15: 10:00:00 10 mL via INTRAVENOUS
  Filled 2018-11-15: qty 10

## 2018-11-15 MED ORDER — SODIUM CHLORIDE 0.9 % IV SOLN
Freq: Once | INTRAVENOUS | Status: AC
  Administered 2018-11-15: 11:00:00 via INTRAVENOUS
  Filled 2018-11-15: qty 250

## 2018-11-15 NOTE — Progress Notes (Signed)
Nutrition Follow-up:  Patient with high grade urothelial carcinoma.  Patient receiving tecentriq.   Noted recent hospital admission and currently in rehab.    Spoke with patient during infusion today.  Patient reports that he is at [facility] and will plan to discharge on 9/29 to home.  He reports that the food is pretty good at rehab and has been eating about 90% of meals.  Had eggs, toast, milk and juice this am before coming to treatment.  Reports that he has not been receiving oral nutrition supplement at rehab.  Reports that his daughter will be helping him meals when he goes home.    Reports that he has been working with PT during his rehab stay and feels stronger.   Medications: reviewed  Labs: reviewed  Anthropometrics:   Weight has decreased to 101 lb 11.2 oz from 116 lb on 9/3   NUTRITION DIAGNOSIS: Inadequate oral intake continues   INTERVENTION:  Discussed with patient the importance of adding calories and protein into diet with weight loss and hospitalization.  Handout given from [facility] and encouraged him to show it to daughter.  My contact information was added to information and encouraged patient to have daughter call me with questions.  Encouraged oral nutrition supplement 2-3 times per day.  Offered another case and patient declined today.     MONITORING, EVALUATION, GOAL: Patient will consume adequate calories and protein to prevent further weight loss   NEXT VISIT: October 15 during infusion  Collin Anglin B. Collin Gonzales, [place], [place] Registered Dietitian [phone] (pager)

## 2018-11-16 LAB — THYROID PANEL WITH TSH
Free Thyroxine Index: 2.1 (ref 1.2–4.9)
T3 Uptake Ratio: 32 % (ref 24–39)
T4, Total: 6.6 ug/dL (ref 4.5–12.0)
TSH: 1.29 u[IU]/mL (ref 0.450–4.500)

## 2018-11-21 ENCOUNTER — Telehealth: Payer: Self-pay | Admitting: Family Medicine

## 2018-11-21 DIAGNOSIS — M0580 Other rheumatoid arthritis with rheumatoid factor of unspecified site: Secondary | ICD-10-CM | POA: Diagnosis not present

## 2018-11-21 DIAGNOSIS — D631 Anemia in chronic kidney disease: Secondary | ICD-10-CM | POA: Diagnosis not present

## 2018-11-21 DIAGNOSIS — M1990 Unspecified osteoarthritis, unspecified site: Secondary | ICD-10-CM | POA: Diagnosis not present

## 2018-11-21 DIAGNOSIS — D630 Anemia in neoplastic disease: Secondary | ICD-10-CM | POA: Diagnosis not present

## 2018-11-21 DIAGNOSIS — N390 Urinary tract infection, site not specified: Secondary | ICD-10-CM | POA: Diagnosis not present

## 2018-11-21 DIAGNOSIS — R1312 Dysphagia, oropharyngeal phase: Secondary | ICD-10-CM | POA: Diagnosis not present

## 2018-11-21 DIAGNOSIS — I129 Hypertensive chronic kidney disease with stage 1 through stage 4 chronic kidney disease, or unspecified chronic kidney disease: Secondary | ICD-10-CM | POA: Diagnosis not present

## 2018-11-21 DIAGNOSIS — J441 Chronic obstructive pulmonary disease with (acute) exacerbation: Secondary | ICD-10-CM | POA: Diagnosis not present

## 2018-11-21 DIAGNOSIS — N189 Chronic kidney disease, unspecified: Secondary | ICD-10-CM | POA: Diagnosis not present

## 2018-11-21 DIAGNOSIS — E46 Unspecified protein-calorie malnutrition: Secondary | ICD-10-CM | POA: Diagnosis not present

## 2018-11-21 DIAGNOSIS — I69354 Hemiplegia and hemiparesis following cerebral infarction affecting left non-dominant side: Secondary | ICD-10-CM | POA: Diagnosis not present

## 2018-11-21 NOTE — Telephone Encounter (Signed)
Copied from [place] [id number]. Topic: General - Other >> Nov 21, 2018  3:39 PM Keene Breath wrote: Reason for CRM: [facility] called to request verbal orders for PT - 2x wk for 2 wks, 1x wk for 2 wks.  CB# [phone]

## 2018-11-22 NOTE — Telephone Encounter (Signed)
Collin [facility] with [facility] to authorize his request for PT orders. No answer, LVM.

## 2018-11-23 ENCOUNTER — Telehealth: Payer: Self-pay | Admitting: Family Medicine

## 2018-11-23 ENCOUNTER — Other Ambulatory Visit: Payer: Self-pay

## 2018-11-23 ENCOUNTER — Ambulatory Visit (INDEPENDENT_AMBULATORY_CARE_PROVIDER_SITE_OTHER): Payer: Medicare Other | Admitting: Family Medicine

## 2018-11-23 ENCOUNTER — Encounter: Payer: Self-pay | Admitting: Family Medicine

## 2018-11-23 DIAGNOSIS — E440 Moderate protein-calorie malnutrition: Secondary | ICD-10-CM | POA: Diagnosis not present

## 2018-11-23 DIAGNOSIS — N390 Urinary tract infection, site not specified: Secondary | ICD-10-CM | POA: Diagnosis not present

## 2018-11-23 DIAGNOSIS — E46 Unspecified protein-calorie malnutrition: Secondary | ICD-10-CM | POA: Diagnosis not present

## 2018-11-23 DIAGNOSIS — C675 Malignant neoplasm of bladder neck: Secondary | ICD-10-CM

## 2018-11-23 DIAGNOSIS — Z87448 Personal history of other diseases of urinary system: Secondary | ICD-10-CM | POA: Diagnosis not present

## 2018-11-23 DIAGNOSIS — N189 Chronic kidney disease, unspecified: Secondary | ICD-10-CM | POA: Diagnosis not present

## 2018-11-23 DIAGNOSIS — D631 Anemia in chronic kidney disease: Secondary | ICD-10-CM | POA: Diagnosis not present

## 2018-11-23 DIAGNOSIS — I129 Hypertensive chronic kidney disease with stage 1 through stage 4 chronic kidney disease, or unspecified chronic kidney disease: Secondary | ICD-10-CM | POA: Diagnosis not present

## 2018-11-23 DIAGNOSIS — M0580 Other rheumatoid arthritis with rheumatoid factor of unspecified site: Secondary | ICD-10-CM | POA: Diagnosis not present

## 2018-11-23 DIAGNOSIS — M1990 Unspecified osteoarthritis, unspecified site: Secondary | ICD-10-CM | POA: Diagnosis not present

## 2018-11-23 DIAGNOSIS — J441 Chronic obstructive pulmonary disease with (acute) exacerbation: Secondary | ICD-10-CM | POA: Diagnosis not present

## 2018-11-23 DIAGNOSIS — I69354 Hemiplegia and hemiparesis following cerebral infarction affecting left non-dominant side: Secondary | ICD-10-CM | POA: Diagnosis not present

## 2018-11-23 DIAGNOSIS — D630 Anemia in neoplastic disease: Secondary | ICD-10-CM | POA: Diagnosis not present

## 2018-11-23 DIAGNOSIS — R1312 Dysphagia, oropharyngeal phase: Secondary | ICD-10-CM | POA: Diagnosis not present

## 2018-11-23 NOTE — Telephone Encounter (Signed)
Home Health Verbal Orders  Caller/Agency: Rowe Clack with [facility] Number: [phone] Requesting OT/PT/Skilled Nursing/Social Work/Speech Therapy: Requesting verbals for OT   Frequency: 1x1 2x2 1x1

## 2018-11-23 NOTE — Progress Notes (Signed)
Name: Collin Gonzales   MRN: [id number]    DOB: March 26, 1935   Date:11/23/2018       Progress Note  Subjective  Chief Complaint  Chief Complaint  Patient presents with  . Follow-up    Discharged from [facility]    I connected with  Collin Gonzales on 11/23/18 at  1:40 PM EDT by telephone and verified that I am speaking with the correct person using two identifiers.  I discussed the limitations, risks, security and privacy concerns of performing an evaluation and management service by telephone and the availability of in person appointments. Staff also discussed with the patient that there may be a patient responsible charge related to this service. Patient Location: at home  Provider Location: [facility] Follow up: he was admitted on 10/26/2018 for acute dehydration, cystitis , generalized weakness, near syncope and non traumatic rhabdomyolysis. He was given fluids. Dr. Bess Kinds did not recommend treatment for UTI since chronic colonizer. He was discharged to [facility] 11/20/2018. He had labs done on 11/15/2018 and is malnutrition with albumin down to 2.6, he has anemia and is feeling weak. White count was back to normal. He is having PT , but states still feels weak. He continues to have sob with activity and has urinary frequency, urgency and intermittent incontinence. He is drinking Ensure and is aware of his malnutrition, he states he is eating his meals also. Not interested in palliative care at this time. He will return for flu vaccine   Patient Active Problem List   Diagnosis Date Noted  . Rhabdomyolysis 10/27/2018  . Aortic atherosclerosis ([place]) 07/20/2018  . Thrombocytopenia, unspecified ([place]) 07/20/2018  . Urothelial carcinoma ([place]) 07/20/2018  . Urothelial carcinoma of bladder ([place]) 11/12/2017  . Urinary retention 11/01/2017  . Dependence on supplemental oxygen 04/24/2017  . Malnutrition ([place]) 04/24/2017  . Hemiparesis  affecting left side as late effect of cerebrovascular accident (CVA) ([place]) 04/24/2017  . Cataract 09/06/2016  . Drug-induced folate deficiency anemia 09/06/2016  . Esophagitis, reflux 09/06/2016  . Iron deficiency anemia due to chronic blood loss 08/23/2016  . Decrease in appetite 09/01/2015  . GERD (gastroesophageal reflux disease) 08/05/2015  . COPD (chronic obstructive pulmonary disease) ([place]) 08/20/2014  . Dyslipidemia 08/20/2014  . Chronic insomnia 08/20/2014  . Arthritis or polyarthritis, rheumatoid ([place]) 08/20/2014  . History of prostate cancer 11/01/2013  . Flutter-fibrillation ([place]) 03/18/2005    Past Surgical History:  Procedure Laterality Date  . APPENDECTOMY  1965  . CATARACT EXTRACTION W/PHACO Right 01/05/2016   Procedure: CATARACT EXTRACTION PHACO AND INTRAOCULAR LENS PLACEMENT (IOC);  Surgeon: [name], MD;  Location: [facility] ORS;  Service: Ophthalmology;  Laterality: Right;  [place] 1.14 AP% 18.1 CDE 13.44 FLUID PACK LOT # [id number] H  . CYSTOSCOPY W/ RETROGRADES Bilateral 11/01/2017   Procedure: CYSTOSCOPY WITH RETROGRADE PYELOGRAM;  Surgeon: Hollice Espy, MD;  Location: [facility] ORS;  Service: Urology;  Laterality: Bilateral;  . ENDOBRONCHIAL ULTRASOUND N/A 02/26/2015   Procedure: ENDOBRONCHIAL ULTRASOUND;  Surgeon: Flora Lipps, MD;  Location: [facility] ORS;  Service: Cardiopulmonary;  Laterality: N/A;  . [place]  . INSERTION PROSTATE RADIATION SEED  2002  . PORTA CATH INSERTION N/A 05/14/2018   Procedure: PORTA CATH INSERTION;  Surgeon: Algernon Huxley, MD;  Location: [place] CV [facility];  Service: Cardiovascular;  Laterality: N/A;  . TRANSURETHRAL RESECTION OF PROSTATE N/A 11/01/2017   Procedure: TRANSURETHRAL RESECTION OF THE PROSTATE (TURP) (channel TURP);  Surgeon: Hollice Espy,  MD;  Location: [facility];  Service: Urology;  Laterality: N/A;    Family History  Problem Relation Age of Onset  . Cancer Mother   . Cancer Father        Lung Cancer  . Cancer  Sister        breast  . Cancer Brother        stomach    Social History   Socioeconomic History  . Marital status: Divorced    Spouse name: Not on file  . Number of children: 5  . Years of education: Not on file  . Highest education level: 12th grade  Occupational History  . Occupation: Retired  Scientific laboratory technician  . Financial resource strain: Not hard at all  . Food insecurity    Worry: Never true    Inability: Never true  . Transportation needs    Medical: No    Non-medical: No  Tobacco Use  . Smoking status: Former Smoker    Packs/day: 0.50    Years: 50.00    Pack years: 25.00    Types: Cigarettes    Quit date: 02/21/2013    Years since quitting: 5.7  . Smokeless tobacco: Never Used  . Tobacco comment: smoking cessation materials not required  Substance and Sexual Activity  . Alcohol use: No    Alcohol/week: 0.0 standard drinks  . Drug use: No  . Sexual activity: Not Currently  Lifestyle  . Physical activity    Days per week: 0 days    Minutes per session: 0 min  . Stress: Not at all  Relationships  . Social connections    Talks on phone: More than three times a week    Gets together: More than three times a week    Attends religious service: More than 4 times per year    Active member of club or organization: No    Attends meetings of clubs or organizations: Never    Relationship status: Divorced  . Intimate partner violence    Fear of current or ex partner: No    Emotionally abused: No    Physically abused: No    Forced sexual activity: No  Other Topics Concern  . Not on file  Social History Narrative  . Not on file     Current Outpatient Medications:  .  Fluticasone-Umeclidin-Vilant (TRELEGY ELLIPTA) 100-62.5-25 MCG/INH AEPB, Inhale 1 puff into the lungs daily., Disp: 60 each, Rfl: 5 .  megestrol (MEGACE) 20 MG tablet, Take 1 tablet (20 mg total) by mouth 2 (two) times daily., Disp: 180 tablet, Rfl: 1 .  methotrexate (RHEUMATREX) 2.5 MG tablet, Take 4  tablets by mouth every Thursday. , Disp: , Rfl:  .  OXYGEN, Place 2 L into the nose daily as needed (shortness of breath). , Disp: , Rfl:  .  QUEtiapine (SEROQUEL) 50 MG tablet, Take 1 tablet (50 mg total) by mouth at bedtime., Disp: 90 tablet, Rfl: 0  Allergies  Allergen Reactions  . Plaquenil [Hydroxychloroquine] Other (See Comments)    Bad dreams  . Simvastatin Other (See Comments)    Bad dreams    I personally reviewed active problem list, medication list, allergies, family history, social history, health maintenance with the patient/caregiver today.   ROS  Ten systems reviewed and is negative except as mentioned in HPI   Objective  Virtual encounter, vitals not obtained.  There is no height or weight on file to calculate BMI.  Physical Exam  Awake, alert and oriented  PHQ2/9: Depression screen [facility] 2/9 11/23/2018 10/22/2018 08/14/2018 07/20/2018 11/24/2017  Decreased Interest 0 0 0 0 0  Down, Depressed, Hopeless 0 0 0 0 0  PHQ - 2 Score 0 0 0 0 0  Altered sleeping 0 0 - 0 3  Tired, decreased energy 0 0 - 0 3  Change in appetite 0 0 - 0 1  Feeling bad or failure about yourself  0 0 - 0 0  Trouble concentrating 0 0 - 0 0  Moving slowly or fidgety/restless 0 0 - 0 0  Suicidal thoughts 0 0 - 0 0  PHQ-9 Score 0 0 - 0 7  Difficult doing work/chores - Not difficult at all - - Not difficult at all  Some recent data might be hidden   PHQ-2/9 Result is negative.    Fall Risk: Fall Risk  11/23/2018 10/22/2018 09/13/2018 08/14/2018 07/20/2018  Falls in the past year? 0 0 0 0 0  Number falls in past yr: 0 0 - 0 0  Injury with Fall? 0 0 - 0 0  Risk for fall due to : - - - - -  Risk for fall due to: Comment - - - - -     Assessment & Plan  1. Cancer, bladder, neck ([place])  He is seeing Oncologist, he is not a surgical candidate and is still on Tecentriq - cycle 16 was this week. Discussed palliative care but he is not interested, he is happy with current management   2.  Moderate protein-calorie malnutrition ([place])  Taking Ensure three times a day and states good appetite   3. History of acute renal failure  Resolved, reviewed labs done by Dr. [name] last week   I discussed the assessment and treatment plan with the patient. The patient was provided an opportunity to ask questions and all were answered. The patient agreed with the plan and demonstrated an understanding of the instructions.   The patient was advised to call back or seek an in-person evaluation if the symptoms worsen or if the condition fails to improve as anticipated.  I provided 25  minutes of non-face-to-face time during this encounter.  Loistine Chance, MD

## 2018-11-26 DIAGNOSIS — N189 Chronic kidney disease, unspecified: Secondary | ICD-10-CM | POA: Diagnosis not present

## 2018-11-26 DIAGNOSIS — I129 Hypertensive chronic kidney disease with stage 1 through stage 4 chronic kidney disease, or unspecified chronic kidney disease: Secondary | ICD-10-CM | POA: Diagnosis not present

## 2018-11-26 DIAGNOSIS — D630 Anemia in neoplastic disease: Secondary | ICD-10-CM | POA: Diagnosis not present

## 2018-11-26 DIAGNOSIS — M1990 Unspecified osteoarthritis, unspecified site: Secondary | ICD-10-CM | POA: Diagnosis not present

## 2018-11-26 DIAGNOSIS — J441 Chronic obstructive pulmonary disease with (acute) exacerbation: Secondary | ICD-10-CM | POA: Diagnosis not present

## 2018-11-26 DIAGNOSIS — N390 Urinary tract infection, site not specified: Secondary | ICD-10-CM | POA: Diagnosis not present

## 2018-11-26 DIAGNOSIS — D631 Anemia in chronic kidney disease: Secondary | ICD-10-CM | POA: Diagnosis not present

## 2018-11-26 DIAGNOSIS — R1312 Dysphagia, oropharyngeal phase: Secondary | ICD-10-CM | POA: Diagnosis not present

## 2018-11-26 DIAGNOSIS — M0580 Other rheumatoid arthritis with rheumatoid factor of unspecified site: Secondary | ICD-10-CM | POA: Diagnosis not present

## 2018-11-26 DIAGNOSIS — I69354 Hemiplegia and hemiparesis following cerebral infarction affecting left non-dominant side: Secondary | ICD-10-CM | POA: Diagnosis not present

## 2018-11-26 DIAGNOSIS — E46 Unspecified protein-calorie malnutrition: Secondary | ICD-10-CM | POA: Diagnosis not present

## 2018-11-26 NOTE — Telephone Encounter (Signed)
Called Collin Gonzales about verbal orders. She is aware.

## 2018-11-28 ENCOUNTER — Telehealth: Payer: Self-pay | Admitting: Family Medicine

## 2018-11-28 DIAGNOSIS — M0580 Other rheumatoid arthritis with rheumatoid factor of unspecified site: Secondary | ICD-10-CM | POA: Diagnosis not present

## 2018-11-28 DIAGNOSIS — D630 Anemia in neoplastic disease: Secondary | ICD-10-CM | POA: Diagnosis not present

## 2018-11-28 DIAGNOSIS — I129 Hypertensive chronic kidney disease with stage 1 through stage 4 chronic kidney disease, or unspecified chronic kidney disease: Secondary | ICD-10-CM | POA: Diagnosis not present

## 2018-11-28 DIAGNOSIS — J441 Chronic obstructive pulmonary disease with (acute) exacerbation: Secondary | ICD-10-CM | POA: Diagnosis not present

## 2018-11-28 DIAGNOSIS — E46 Unspecified protein-calorie malnutrition: Secondary | ICD-10-CM | POA: Diagnosis not present

## 2018-11-28 DIAGNOSIS — I69354 Hemiplegia and hemiparesis following cerebral infarction affecting left non-dominant side: Secondary | ICD-10-CM | POA: Diagnosis not present

## 2018-11-28 DIAGNOSIS — N189 Chronic kidney disease, unspecified: Secondary | ICD-10-CM | POA: Diagnosis not present

## 2018-11-28 DIAGNOSIS — R1312 Dysphagia, oropharyngeal phase: Secondary | ICD-10-CM | POA: Diagnosis not present

## 2018-11-28 DIAGNOSIS — N390 Urinary tract infection, site not specified: Secondary | ICD-10-CM | POA: Diagnosis not present

## 2018-11-28 DIAGNOSIS — M1990 Unspecified osteoarthritis, unspecified site: Secondary | ICD-10-CM | POA: Diagnosis not present

## 2018-11-28 DIAGNOSIS — D631 Anemia in chronic kidney disease: Secondary | ICD-10-CM | POA: Diagnosis not present

## 2018-11-28 NOTE — Telephone Encounter (Signed)
Called Amy to give verbal order consent. No answer, LVM.

## 2018-11-28 NOTE — Telephone Encounter (Signed)
Home Health Verbal Orders - Caller/Agency: [facility] / [facility] Number: [phone] vm can be left  Requesting Skilled Nursing Frequency: needs approval for plan of care  2x a week for 1 week  1x a week for 2 weeks 1 x a week every other week for 3 weeks

## 2018-11-29 ENCOUNTER — Ambulatory Visit: Payer: Medicare Other

## 2018-11-29 DIAGNOSIS — I69354 Hemiplegia and hemiparesis following cerebral infarction affecting left non-dominant side: Secondary | ICD-10-CM | POA: Diagnosis not present

## 2018-11-29 DIAGNOSIS — D630 Anemia in neoplastic disease: Secondary | ICD-10-CM | POA: Diagnosis not present

## 2018-11-29 DIAGNOSIS — J441 Chronic obstructive pulmonary disease with (acute) exacerbation: Secondary | ICD-10-CM | POA: Diagnosis not present

## 2018-11-29 DIAGNOSIS — E46 Unspecified protein-calorie malnutrition: Secondary | ICD-10-CM | POA: Diagnosis not present

## 2018-11-29 DIAGNOSIS — I129 Hypertensive chronic kidney disease with stage 1 through stage 4 chronic kidney disease, or unspecified chronic kidney disease: Secondary | ICD-10-CM | POA: Diagnosis not present

## 2018-11-29 DIAGNOSIS — N390 Urinary tract infection, site not specified: Secondary | ICD-10-CM | POA: Diagnosis not present

## 2018-11-29 DIAGNOSIS — M1990 Unspecified osteoarthritis, unspecified site: Secondary | ICD-10-CM | POA: Diagnosis not present

## 2018-11-29 DIAGNOSIS — D631 Anemia in chronic kidney disease: Secondary | ICD-10-CM | POA: Diagnosis not present

## 2018-11-29 DIAGNOSIS — R1312 Dysphagia, oropharyngeal phase: Secondary | ICD-10-CM | POA: Diagnosis not present

## 2018-11-29 DIAGNOSIS — N189 Chronic kidney disease, unspecified: Secondary | ICD-10-CM | POA: Diagnosis not present

## 2018-11-29 DIAGNOSIS — M0580 Other rheumatoid arthritis with rheumatoid factor of unspecified site: Secondary | ICD-10-CM | POA: Diagnosis not present

## 2018-11-30 DIAGNOSIS — I129 Hypertensive chronic kidney disease with stage 1 through stage 4 chronic kidney disease, or unspecified chronic kidney disease: Secondary | ICD-10-CM | POA: Diagnosis not present

## 2018-11-30 DIAGNOSIS — D630 Anemia in neoplastic disease: Secondary | ICD-10-CM | POA: Diagnosis not present

## 2018-11-30 DIAGNOSIS — M1990 Unspecified osteoarthritis, unspecified site: Secondary | ICD-10-CM | POA: Diagnosis not present

## 2018-11-30 DIAGNOSIS — J441 Chronic obstructive pulmonary disease with (acute) exacerbation: Secondary | ICD-10-CM | POA: Diagnosis not present

## 2018-11-30 DIAGNOSIS — N390 Urinary tract infection, site not specified: Secondary | ICD-10-CM | POA: Diagnosis not present

## 2018-11-30 DIAGNOSIS — R1312 Dysphagia, oropharyngeal phase: Secondary | ICD-10-CM | POA: Diagnosis not present

## 2018-11-30 DIAGNOSIS — E46 Unspecified protein-calorie malnutrition: Secondary | ICD-10-CM | POA: Diagnosis not present

## 2018-11-30 DIAGNOSIS — I69354 Hemiplegia and hemiparesis following cerebral infarction affecting left non-dominant side: Secondary | ICD-10-CM | POA: Diagnosis not present

## 2018-11-30 DIAGNOSIS — N189 Chronic kidney disease, unspecified: Secondary | ICD-10-CM | POA: Diagnosis not present

## 2018-11-30 DIAGNOSIS — D631 Anemia in chronic kidney disease: Secondary | ICD-10-CM | POA: Diagnosis not present

## 2018-11-30 DIAGNOSIS — M0580 Other rheumatoid arthritis with rheumatoid factor of unspecified site: Secondary | ICD-10-CM | POA: Diagnosis not present

## 2018-12-03 NOTE — Progress Notes (Signed)
[facility]  [phone]) [phone] Fax:([phone]) [phone]  ID: [name]: 02/23/1935  MR#: [id number]  DXI#:[id number]  Patient Care Team: [name], MD as PCP - General (Family Medicine) [name], MD as Referring Physician (Ophthalmology) [name], MD as Consulting Physician (Rheumatology) [name], MD as Referring Physician (Specialist) [name], MD as Consulting Physician (Urology) [name], MD as Referring Physician (Radiation Oncology)  CHIEF COMPLAINT: Locally advanced high-grade urothelial carcinoma.    INTERVAL HISTORY: Patient returns to clinic today for further evaluation and consideration of cycle 17 of Tecentriq.  He has had significant pelvic/penile pain today and appears uncomfortable. He continues to have incontinence secondary to his known fistula.  He has no neurologic complaints.  He denies any recent fevers or illnesses.  He has chronic shortness of breath, but denies any chest pain, hemoptysis, or cough.  He has no nausea, vomiting, constipation, or diarrhea.  He denies any hematuria.  Patient offers no further specific complaints today.  REVIEW OF SYSTEMS:   Review of Systems  Constitutional: Negative.  Negative for fever, malaise/fatigue and weight loss.  Respiratory: Positive for shortness of breath. Negative for cough and hemoptysis.   Cardiovascular: Negative.  Negative for chest pain and leg swelling.  Gastrointestinal: Negative.  Negative for abdominal pain, blood in stool and melena.  Genitourinary: Positive for frequency and urgency. Negative for dysuria and hematuria.       Pelvic pain  Musculoskeletal: Negative.  Negative for back pain.  Skin: Negative.  Negative for rash.  Neurological: Negative.  Negative for sensory change, focal weakness, weakness and headaches.  Psychiatric/Behavioral: Negative.  The patient is not nervous/anxious.     As per HPI. Otherwise, a  complete review of systems is negative.  PAST MEDICAL HISTORY: Past Medical History:  Diagnosis Date   Arthritis    Collagen vascular disease (HCC)    rheumatoid arthritis   COPD (chronic obstructive pulmonary disease) (HCC)    GERD (gastroesophageal reflux disease)    History of prostate cancer    HLD (hyperlipidemia)    HOH (hard of hearing)    Insomnia    Oxygen deficit    2L HS AND PRN   Primary cancer of right lung ([place]) 2017   rad tx's.   Prostate cancer ([place]) 2006   Rad seed tx's.   Stroke [facility])    2000   Urothelial carcinoma of bladder ([place]) 10/2017   TURP to Prostate and chemo tx's.    PAST SURGICAL HISTORY: Past Surgical History:  Procedure Laterality Date   APPENDECTOMY  1965   CATARACT EXTRACTION W/PHACO Right 01/05/2016   Procedure: CATARACT EXTRACTION PHACO AND INTRAOCULAR LENS PLACEMENT ([place]);  Surgeon: [name], MD;  Location: [facility] ORS;  Service: Ophthalmology;  Laterality: Right;  [place] 1.14 AP% 18.1 CDE 13.44 FLUID PACK LOT # [id number] H   CYSTOSCOPY W/ RETROGRADES Bilateral 11/01/2017   Procedure: CYSTOSCOPY WITH RETROGRADE PYELOGRAM;  Surgeon: Hollice Espy, MD;  Location: [facility] OR[facility];  Service: Urology;  Laterality: Bilateral;   ENDOBRONCHIAL ULTRASOUND N/A 02/26/2015   Procedure: ENDOBRONCHIAL ULTRASOUND;  Surgeon: Flora Lipps, MD;  Location: [facility] ORS;  Service: Cardiopulmonary;  Laterality: N/A;   [place] RADIATION SEED  2002   PORTA CATH INSERTION N/A 05/14/2018   Procedure: PORTA CATH INSERTION;  Surgeon: Algernon Huxley, MD;  Location: [place] CV LAB;  Service: Cardiovascular;  Laterality: N/A;   TRANSURETHRAL RESECTION OF PROSTATE N/A 11/01/2017  Procedure: TRANSURETHRAL RESECTION OF THE PROSTATE (TURP) (channel TURP);  Surgeon: Hollice Espy, MD;  Location: [facility] ORS;  Service: Urology;  Laterality: N/A;    FAMILY HISTORY: Family History  Problem Relation Age of Onset   Cancer Mother     Cancer Father        Lung Cancer   Cancer Sister        breast   Cancer Brother        stomach    ADVANCED DIRECTIVES (Y/N):  N  HEALTH MAINTENANCE: Social History   Tobacco Use   Smoking status: Former Smoker    Packs/day: 0.50    Years: 50.00    Pack years: 25.00    Types: Cigarettes    Quit date: 02/21/2013    Years since quitting: 5.7   Smokeless tobacco: Never Used   Tobacco comment: smoking cessation materials not required  Substance Use Topics   Alcohol use: No    Alcohol/week: 0.0 standard drinks   Drug use: No     Colonoscopy:  PAP:  Bone density:  Lipid panel:  Allergies  Allergen Reactions   Plaquenil [Hydroxychloroquine] Other (See Comments)    Bad dreams   Simvastatin Other (See Comments)    Bad dreams    Current Outpatient Medications  Medication Sig Dispense Refill   Fluticasone-Umeclidin-Vilant (TRELEGY ELLIPTA) 100-62.5-25 MCG/INH AEPB Inhale 1 puff into the lungs daily. 60 each 5   megestrol (MEGACE) 20 MG tablet Take 1 tablet (20 mg total) by mouth 2 (two) times daily. 180 tablet 1   methotrexate (RHEUMATREX) 2.5 MG tablet Take 4 tablets by mouth every Thursday.      OXYGEN Place 4 L into the nose daily as needed (shortness of breath).      QUEtiapine (SEROQUEL) 50 MG tablet Take 1 tablet (50 mg total) by mouth at bedtime. 90 tablet 0   cephALEXin (KEFLEX) 500 MG capsule Take 1 capsule (500 mg total) by mouth 2 (two) times daily. 14 capsule 0   nystatin cream (MYCOSTATIN) Apply 1 application topically 2 (two) times daily. 30 g 0   oxyCODONE (OXY IR/ROXICODONE) 5 MG immediate release tablet Take 2 tablets (10 mg total) by mouth every 6 (six) hours as needed for severe pain. 30 tablet 0   No current facility-administered medications for this visit.     OBJECTIVE: Vitals:   12/06/18 1404  BP: 102/63  Pulse: 83  Temp: (!) 97.3 F (36.3 C)     Body mass index is 15.81 kg/m.    ECOG FS:0 - Asymptomatic  General: Thin,  mild distress secondary to pelvic/penile pain: Sitting in a wheelchair. Eyes: Pink conjunctiva, anicteric sclera. HEENT: Normocephalic, moist mucous membranes. Lungs: Clear to auscultation bilaterally. Heart: Regular rate and rhythm. No rubs, murmurs, or gallops. Abdomen: Soft, nontender, nondistended. No organomegaly noted, normoactive bowel sounds. Musculoskeletal: No edema, cyanosis, or clubbing. Neuro: Alert, answering all questions appropriately. Cranial nerves grossly intact. Skin: No rashes or petechiae noted. Psych: Normal affect.  LAB RESULTS:  Lab Results  Component Value Date   NA 136 12/06/2018   K 4.1 12/06/2018   CL 105 12/06/2018   CO2 26 12/06/2018   GLUCOSE 93 12/06/2018   BUN 28 (H) 12/06/2018   CREATININE 0.99 12/06/2018   CALCIUM 8.8 (L) 12/06/2018   PROT 8.4 (H) 12/06/2018   ALBUMIN 2.5 (L) 12/06/2018   AST 17 12/06/2018   ALT 14 12/06/2018   ALKPHOS 56 12/06/2018   BILITOT 0.4 12/06/2018   GFRNONAA >60 12/06/2018  GFRAA >60 12/06/2018    Lab Results  Component Value Date   WBC 7.6 12/06/2018   NEUTROABS 6.1 12/06/2018   HGB 8.9 (L) 12/06/2018   HCT 29.5 (L) 12/06/2018   MCV 92.5 12/06/2018   PLT 340 12/06/2018     STUDIES: No results found.  ASSESSMENT: Locally advanced high-grade urothelial carcinoma.  PLAN:    1. Locally advanced high-grade urothelial carcinoma: Case discussed with urology, radiation oncology, as well as multidisciplinary tumor board.  Patient is not a surgical candidate nor can he receive additional XRT given his history of prostate cancer and brachii therapy seed placement.  Though he has a decent performance status, patient is frail and likely would not tolerate cisplatin based therapy.  CT scan results from September 19, 2018 reviewed independently with no obvious evidence of recurrent or progressive disease outside patient's bladder. Previously, hospice and end-of-life were discussed but patient is not interested at this  time.  Despite evidence of fistula, patient is not a surgical candidate therefore will continue to monitor with routine CT scans.  Proceed with cycle 17 of Tecentriq today.  Return to clinic in 3 weeks for further evaluation and consideration of continuation of treatment.  Will get a CT scan prior to next visit.   2.  Right upper lobe lung mass: Previously, although biopsy was negative, patient had a positive PET scan that was highly suspicious for underlying malignancy. He underwent SBRT in approximately May 2017.   3.  Shortness of breath: Chronic and unchanged.  Patient now requires oxygen 24 hours/day although is admittedly noncompliant. 4.  Anemia: Chronic and unchanged.  Patient's hemoglobin has trended down to 8.9. 5.  Hematuria: Patient does not complain of this today.  Appreciate urology input. 6.  Renal insufficiency: Resolved. 7.  Leukocytosis: Resolved. 8.  Pain: Continue oxycodone 10 mg every 4 hours as needed.  Patient was given a refill today.  He also was given a stat referral back to urology for further evaluation.  Appreciate their input and accommodation.  Patient expressed understanding and was in agreement with this plan. He also understands that He can call clinic at any time with any questions, concerns, or complaints.    Lloyd Huger, MD   12/08/2018 7:27 AM

## 2018-12-04 DIAGNOSIS — R9389 Abnormal findings on diagnostic imaging of other specified body structures: Secondary | ICD-10-CM | POA: Diagnosis not present

## 2018-12-04 DIAGNOSIS — Z79899 Other long term (current) drug therapy: Secondary | ICD-10-CM | POA: Diagnosis not present

## 2018-12-04 DIAGNOSIS — M0579 Rheumatoid arthritis with rheumatoid factor of multiple sites without organ or systems involvement: Secondary | ICD-10-CM | POA: Diagnosis not present

## 2018-12-04 DIAGNOSIS — C679 Malignant neoplasm of bladder, unspecified: Secondary | ICD-10-CM | POA: Diagnosis not present

## 2018-12-04 DIAGNOSIS — D638 Anemia in other chronic diseases classified elsewhere: Secondary | ICD-10-CM | POA: Diagnosis not present

## 2018-12-05 ENCOUNTER — Other Ambulatory Visit: Payer: Self-pay

## 2018-12-05 ENCOUNTER — Encounter: Payer: Self-pay | Admitting: Oncology

## 2018-12-05 ENCOUNTER — Telehealth: Payer: Self-pay

## 2018-12-05 DIAGNOSIS — C689 Malignant neoplasm of urinary organ, unspecified: Secondary | ICD-10-CM

## 2018-12-05 NOTE — Progress Notes (Signed)
Patient stated that he had been doing well.

## 2018-12-05 NOTE — Telephone Encounter (Signed)
Nutrition  RD was planning on meeting with patient tomorrow, 10/15 during infusion but RD unable to meet with patient tomorrow due to unforeseen circumstances.  Called patient this am to let him know that RD would not be meeting with him tomorrow in infusion.  Patient verbalized understanding.  Will plan to follow-up with patient at later date.    Samil Mecham [name], [place], [place] Registered Dietitian [phone] (pager)

## 2018-12-06 ENCOUNTER — Other Ambulatory Visit: Payer: Self-pay

## 2018-12-06 ENCOUNTER — Ambulatory Visit (INDEPENDENT_AMBULATORY_CARE_PROVIDER_SITE_OTHER): Payer: Medicare Other | Admitting: Urology

## 2018-12-06 ENCOUNTER — Inpatient Hospital Stay: Payer: Medicare Other | Attending: Oncology | Admitting: *Deleted

## 2018-12-06 ENCOUNTER — Inpatient Hospital Stay: Payer: Medicare Other

## 2018-12-06 ENCOUNTER — Inpatient Hospital Stay (HOSPITAL_BASED_OUTPATIENT_CLINIC_OR_DEPARTMENT_OTHER): Payer: Medicare Other | Admitting: Oncology

## 2018-12-06 ENCOUNTER — Encounter: Payer: Self-pay | Admitting: Urology

## 2018-12-06 VITALS — BP 100/63 | HR 76 | Temp 97.3°F | Resp 20

## 2018-12-06 VITALS — BP 102/63 | HR 83 | Temp 97.3°F | Wt 104.0 lb

## 2018-12-06 VITALS — BP 100/61 | HR 84 | Ht 68.0 in | Wt 104.0 lb

## 2018-12-06 DIAGNOSIS — N471 Phimosis: Secondary | ICD-10-CM | POA: Diagnosis not present

## 2018-12-06 DIAGNOSIS — Z8546 Personal history of malignant neoplasm of prostate: Secondary | ICD-10-CM | POA: Diagnosis not present

## 2018-12-06 DIAGNOSIS — M069 Rheumatoid arthritis, unspecified: Secondary | ICD-10-CM | POA: Diagnosis not present

## 2018-12-06 DIAGNOSIS — Z5112 Encounter for antineoplastic immunotherapy: Secondary | ICD-10-CM | POA: Insufficient documentation

## 2018-12-06 DIAGNOSIS — Z79899 Other long term (current) drug therapy: Secondary | ICD-10-CM | POA: Insufficient documentation

## 2018-12-06 DIAGNOSIS — Z9079 Acquired absence of other genital organ(s): Secondary | ICD-10-CM | POA: Diagnosis not present

## 2018-12-06 DIAGNOSIS — C689 Malignant neoplasm of urinary organ, unspecified: Secondary | ICD-10-CM

## 2018-12-06 DIAGNOSIS — Z85118 Personal history of other malignant neoplasm of bronchus and lung: Secondary | ICD-10-CM | POA: Insufficient documentation

## 2018-12-06 DIAGNOSIS — E785 Hyperlipidemia, unspecified: Secondary | ICD-10-CM | POA: Insufficient documentation

## 2018-12-06 DIAGNOSIS — D649 Anemia, unspecified: Secondary | ICD-10-CM | POA: Diagnosis not present

## 2018-12-06 DIAGNOSIS — Z95828 Presence of other vascular implants and grafts: Secondary | ICD-10-CM

## 2018-12-06 DIAGNOSIS — J449 Chronic obstructive pulmonary disease, unspecified: Secondary | ICD-10-CM | POA: Insufficient documentation

## 2018-12-06 DIAGNOSIS — N476 Balanoposthitis: Secondary | ICD-10-CM

## 2018-12-06 DIAGNOSIS — C679 Malignant neoplasm of bladder, unspecified: Secondary | ICD-10-CM | POA: Insufficient documentation

## 2018-12-06 DIAGNOSIS — Z8673 Personal history of transient ischemic attack (TIA), and cerebral infarction without residual deficits: Secondary | ICD-10-CM | POA: Diagnosis not present

## 2018-12-06 DIAGNOSIS — Z87891 Personal history of nicotine dependence: Secondary | ICD-10-CM | POA: Diagnosis not present

## 2018-12-06 LAB — COMPREHENSIVE METABOLIC PANEL
ALT: 14 U/L (ref 0–44)
AST: 17 U/L (ref 15–41)
Albumin: 2.5 g/dL — ABNORMAL LOW (ref 3.5–5.0)
Alkaline Phosphatase: 56 U/L (ref 38–126)
Anion gap: 5 (ref 5–15)
BUN: 28 mg/dL — ABNORMAL HIGH (ref 8–23)
CO2: 26 mmol/L (ref 22–32)
Calcium: 8.8 mg/dL — ABNORMAL LOW (ref 8.9–10.3)
Chloride: 105 mmol/L (ref 98–111)
Creatinine, Ser: 0.99 mg/dL (ref 0.61–1.24)
GFR calc Af Amer: >60 mL/min (ref >60)
GFR calc non Af Amer: >60 mL/min (ref >60)
Glucose, Bld: 93 mg/dL (ref 70–99)
Potassium: 4.1 mmol/L (ref 3.5–5.1)
Sodium: 136 mmol/L (ref 135–145)
Total Bilirubin: 0.4 mg/dL (ref 0.3–1.2)
Total Protein: 8.4 g/dL — ABNORMAL HIGH (ref 6.5–8.1)

## 2018-12-06 LAB — CBC WITH DIFFERENTIAL/PLATELET
Abs Immature Granulocytes: 0.02 10*3/uL (ref 0.00–0.07)
Basophils Absolute: 0 10*3/uL (ref 0.0–0.1)
Basophils Relative: 0 %
Eosinophils Absolute: 0.3 10*3/uL (ref 0.0–0.5)
Eosinophils Relative: 3 %
HCT: 29.5 % — ABNORMAL LOW (ref 39.0–52.0)
Hemoglobin: 8.9 g/dL — ABNORMAL LOW (ref 13.0–17.0)
Immature Granulocytes: 0 %
Lymphocytes Relative: 11 %
Lymphs Abs: 0.8 10*3/uL (ref 0.7–4.0)
MCH: 27.9 pg (ref 26.0–34.0)
MCHC: 30.2 g/dL (ref 30.0–36.0)
MCV: 92.5 fL (ref 80.0–100.0)
Monocytes Absolute: 0.4 10*3/uL (ref 0.1–1.0)
Monocytes Relative: 6 %
Neutro Abs: 6.1 10*3/uL (ref 1.7–7.7)
Neutrophils Relative %: 80 %
Platelets: 340 10*3/uL (ref 150–400)
RBC: 3.19 MIL/uL — ABNORMAL LOW (ref 4.22–5.81)
RDW: 16.5 % — ABNORMAL HIGH (ref 11.5–15.5)
WBC: 7.6 10*3/uL (ref 4.0–10.5)
nRBC: 0 % (ref 0.0–0.2)

## 2018-12-06 MED ORDER — SODIUM CHLORIDE 0.9 % IV SOLN
1200.0000 mg | Freq: Once | INTRAVENOUS | Status: AC
  Administered 2018-12-06: 13:00:00 1200 mg via INTRAVENOUS
  Filled 2018-12-06: qty 20

## 2018-12-06 MED ORDER — HEPARIN SOD (PORK) LOCK FLUSH 100 UNIT/ML IV SOLN
500.0000 [IU] | Freq: Once | INTRAVENOUS | Status: AC | PRN
  Administered 2018-12-06: 14:00:00 500 [IU]
  Filled 2018-12-06: qty 5

## 2018-12-06 MED ORDER — OXYCODONE HCL 5 MG PO TABS
10.0000 mg | ORAL_TABLET | Freq: Once | ORAL | Status: AC
  Administered 2018-12-06: 13:00:00 10 mg via ORAL
  Filled 2018-12-06: qty 2

## 2018-12-06 MED ORDER — SODIUM CHLORIDE 0.9 % IV SOLN
Freq: Once | INTRAVENOUS | Status: AC
  Administered 2018-12-06: 13:00:00 via INTRAVENOUS
  Filled 2018-12-06: qty 250

## 2018-12-06 MED ORDER — NYSTATIN 100000 UNIT/GM EX CREA: 1.0000 "application " | TOPICAL_CREAM | Freq: Two times a day (BID) | CUTANEOUS | 0 refills | Status: AC

## 2018-12-06 MED ORDER — CEPHALEXIN 500 MG PO CAPS: 500.0000 mg | ORAL_CAPSULE | Freq: Two times a day (BID) | ORAL | 0 refills | Status: DC

## 2018-12-06 MED ORDER — SODIUM CHLORIDE 0.9% FLUSH
10.0000 mL | Freq: Once | INTRAVENOUS | Status: AC
  Administered 2018-12-06: 11:00:00 10 mL via INTRAVENOUS
  Filled 2018-12-06: qty 10

## 2018-12-06 NOTE — Progress Notes (Signed)
Patient here today for follow up.  Patient c/o swelling and pain in his penis.

## 2018-12-06 NOTE — Progress Notes (Signed)
12/06/2018 2:24 PM   Collin Gonzales 1935/10/12 [id number]  Referring provider: [name], MD [address] [place],  [place] [address]  No chief complaint on file.   HPI: Collin Gonzales is an 83 year old male with locally advanced high-grade urothelial carcinoma who presents today with the complaint of penile swelling.    He states he started to experience penile swelling and pain yesterday.  Nothing helps the pain.  Nothing makes the pain better.  He denies any trauma to the area.  He denies any sexual activity.  Patient denies any gross hematuria, dysuria or suprapubic/flank pain.  Patient denies any fevers, chills, nausea or vomiting.   PMH: Past Medical History:  Diagnosis Date  . Arthritis   . Collagen vascular disease (HCC)    rheumatoid arthritis  . COPD (chronic obstructive pulmonary disease) ([place])   . GERD (gastroesophageal reflux disease)   . History of prostate cancer   . HLD (hyperlipidemia)   . HOH (hard of hearing)   . Insomnia   . Oxygen deficit    2L HS AND PRN  . Primary cancer of right lung ([place]) 2017   rad tx's.  . Prostate cancer ([place]) 2006   Rad seed tx's.  . Stroke ([place])    2000  . Urothelial carcinoma of bladder ([place]) 10/2017   TURP to Prostate and chemo tx's.    Surgical History: Past Surgical History:  Procedure Laterality Date  . APPENDECTOMY  1965  . CATARACT EXTRACTION W/PHACO Right 01/05/2016   Procedure: CATARACT EXTRACTION PHACO AND INTRAOCULAR LENS PLACEMENT (IOC);  Surgeon: [name], MD;  Location: [facility];  Service: Ophthalmology;  Laterality: Right;  [place] 1.14 AP% 18.1 CDE 13.44 FLUID PACK LOT # [id number] H  . CYSTOSCOPY W/ RETROGRADES Bilateral 11/01/2017   Procedure: CYSTOSCOPY WITH RETROGRADE PYELOGRAM;  Surgeon: Hollice Espy, MD;  Location: [facility];  Service: Urology;  Laterality: Bilateral;  . ENDOBRONCHIAL ULTRASOUND N/A 02/26/2015   Procedure: ENDOBRONCHIAL ULTRASOUND;  Surgeon: Flora Lipps, MD;   Location: [facility];  Service: Cardiopulmonary;  Laterality: N/A;  . [place]  . INSERTION PROSTATE RADIATION SEED  2002  . PORTA CATH INSERTION N/A 05/14/2018   Procedure: PORTA CATH INSERTION;  Surgeon: Algernon Huxley, MD;  Location: [facility];  Service: Cardiovascular;  Laterality: N/A;  . TRANSURETHRAL RESECTION OF PROSTATE N/A 11/01/2017   Procedure: TRANSURETHRAL RESECTION OF THE PROSTATE (TURP) (channel TURP);  Surgeon: Hollice Espy, MD;  Location: [facility];  Service: Urology;  Laterality: N/A;    Home Medications:  Allergies as of 12/06/2018      Reactions   Plaquenil [hydroxychloroquine] Other (See Comments)   Bad dreams   Simvastatin Other (See Comments)   Bad dreams      Medication List       Accurate as of December 06, 2018  2:24 PM. If you have any questions, ask your nurse or doctor.        megestrol 20 MG tablet Commonly known as: MEGACE Take 1 tablet (20 mg total) by mouth 2 (two) times daily.   methotrexate 2.5 MG tablet Commonly known as: RHEUMATREX Take 4 tablets by mouth every Thursday.   nystatin cream Commonly known as: MYCOSTATIN Apply 1 application topically 2 (two) times daily. Started by: [name], PA-C   OXYGEN Place 4 L into the nose daily as needed (shortness of breath).   QUEtiapine 50 MG tablet Commonly known as: SEROQUEL Take 1 tablet (50 mg  total) by mouth at bedtime.   Trelegy Ellipta 100-62.5-25 MCG/INH Aepb Generic drug: Fluticasone-Umeclidin-Vilant Inhale 1 puff into the lungs daily.       Allergies:  Allergies  Allergen Reactions  . Plaquenil [Hydroxychloroquine] Other (See Comments)    Bad dreams  . Simvastatin Other (See Comments)    Bad dreams    Family History: Family History  Problem Relation Age of Onset  . Cancer Mother   . Cancer Father        Lung Cancer  . Cancer Sister        breast  . Cancer Brother        stomach    Social History:  reports that he quit smoking about  5 years ago. His smoking use included cigarettes. He has a 25.00 pack-year smoking history. He has never used smokeless tobacco. He reports that he does not drink alcohol or use drugs.  ROS: UROLOGY Frequent Urination?: No Hard to postpone urination?: Yes Burning/pain with urination?: Yes Get up at night to urinate?: No Leakage of urine?: Yes Urine stream starts and stops?: No Trouble starting stream?: No Do you have to strain to urinate?: No Blood in urine?: No Urinary tract infection?: No Sexually transmitted disease?: Yes Injury to kidneys or bladder?: No Painful intercourse?: No Weak stream?: No Erection problems?: No Penile pain?: Yes  Gastrointestinal Nausea?: No Vomiting?: No Indigestion/heartburn?: No Diarrhea?: No Constipation?: No  Constitutional Fever: No Night sweats?: No Weight loss?: Yes Fatigue?: No  Skin Skin rash/lesions?: No Itching?: No  Eyes Blurred vision?: No Double vision?: No  Ears/Nose/Throat Sore throat?: No Sinus problems?: No  Hematologic/Lymphatic Swollen glands?: No Easy bruising?: No  Cardiovascular Leg swelling?: No Chest pain?: No  Respiratory Cough?: No Shortness of breath?: Yes  Endocrine Excessive thirst?: No  Musculoskeletal Back pain?: No Joint pain?: No  Neurological Headaches?: No Dizziness?: No  Psychologic Depression?: No Anxiety?: No  Physical Exam: BP 100/61   Pulse 84   Ht 5\' 8"  (1.727 m)   Wt 104 lb (47.2 kg)   BMI 15.81 kg/m   Constitutional:  Well nourished. Alert and oriented, No acute distress. HEENT: [place] AT, moist mucus membranes.  Trachea midline, no masses. Cardiovascular: No clubbing, cyanosis, or edema. Respiratory: Normal respiratory effort, no increased work of breathing. GI: Abdomen is soft, non tender, non distended, no abdominal masses. Liver and spleen not palpable.  No hernias appreciated.  Stool sample for occult testing is not indicated.   GU: No CVA tenderness.  No  bladder fullness or masses.  Patient with uncircumcised phallus with phimosis.  Could not retract the foreskin.  Areas of cracked skin and lichenification on the tip of the foreskin and middle of the scrotum likely due to the irritation from wearing depends.  No crepitus, no fluctuation or erythema noted.  Scrotum without lesions, cysts, rashes and/or edema.  Right > Left hydrocele noted.  Testicles are located scrotally bilaterally. No masses are appreciated in the testicles. Left and right epididymis are normal. Neurologic: Grossly intact, no focal deficits, moving all 4 extremities. Psychiatric: Normal mood and affect.  Laboratory Data: Lab Results  Component Value Date   WBC 7.6 12/06/2018   HGB 8.9 (L) 12/06/2018   HCT 29.5 (L) 12/06/2018   MCV 92.5 12/06/2018   PLT 340 12/06/2018    Lab Results  Component Value Date   CREATININE 0.99 12/06/2018    No results found for: PSA  No results found for: TESTOSTERONE  Lab Results  Component Value  Date   HGBA1C 5.7 (H) 10/27/2018    Lab Results  Component Value Date   TSH 1.290 11/15/2018       Component Value Date/Time   CHOL 150 10/11/2017 1239   CHOL 152 07/08/2015 0846   HDL 51 10/11/2017 1239   HDL 43 07/08/2015 0846   CHOLHDL 2.9 10/11/2017 1239   VLDL 13 05/30/2016 1128   LDLCALC 85 10/11/2017 1239    Lab Results  Component Value Date   AST 17 12/06/2018   Lab Results  Component Value Date   ALT 14 12/06/2018   No components found for: ALKALINEPHOPHATASE No components found for: BILIRUBINTOTAL  No results found for: ESTRADIOL  Urinalysis    Component Value Date/Time   COLORURINE YELLOW (A) 10/26/2018 2310   APPEARANCEUR TURBID (A) 10/26/2018 2310   APPEARANCEUR Cloudy (A) 02/02/2018 1145   LABSPEC 1.023 10/26/2018 2310   PHURINE 7.0 10/26/2018 2310   GLUCOSEU NEGATIVE 10/26/2018 2310   HGBUR MODERATE (A) 10/26/2018 2310   BILIRUBINUR NEGATIVE 10/26/2018 2310   BILIRUBINUR Negative 02/02/2018 1145    KETONESUR 5 (A) 10/26/2018 2310   PROTEINUR 100 (A) 10/26/2018 2310   UROBILINOGEN 0.2 03/21/2017 1431   NITRITE NEGATIVE 10/26/2018 2310   LEUKOCYTESUR MODERATE (A) 10/26/2018 2310    I have reviewed the labs.   Assessment & Plan:    1. Phimosis Suspect that the penile swelling that he is referring to is urine getting trapped in the foreskin as no penile swelling was seen today during the exam  2. Balanoposthitis Prescribed Nystatin cream to apply BID RTC in one week for recheck   Return in about 1 week (around 12/13/2018) for recheck .  These notes generated with voice recognition software. I apologize for typographical errors.  [name], PA-C  [facility] [address]  [place], [place] [address] [phone]

## 2018-12-07 ENCOUNTER — Telehealth: Payer: Self-pay | Admitting: Family Medicine

## 2018-12-07 DIAGNOSIS — J449 Chronic obstructive pulmonary disease, unspecified: Secondary | ICD-10-CM | POA: Diagnosis not present

## 2018-12-07 LAB — THYROID PANEL WITH TSH
Free Thyroxine Index: 2.1 (ref 1.2–4.9)
T3 Uptake Ratio: 34 % (ref 24–39)
T4, Total: 6.3 ug/dL (ref 4.5–12.0)
TSH: 1.03 u[IU]/mL (ref 0.450–4.500)

## 2018-12-07 NOTE — Telephone Encounter (Signed)
Collin Gonzales, physical therapist with [facility] and he just wanted to keep Dr. Ancil Boozer in the loop since it was a concern. Patient did see [name] yesterday for this issue.

## 2018-12-07 NOTE — Telephone Encounter (Signed)
Merry Proud, physical therapist with [facility], calling and states that the patient has complained of Increase in penile pain. States he has been having pain but it has gotten worse and is 10/10. States that he had seen Dr Massie Maroon (his oncologist) and states that he wax given oxycodone, which only brings the pain down to 7-8. States that patient was unable to do much therapy today due to pain.   CB#: [phone] (voice mail)

## 2018-12-08 MED ORDER — OXYCODONE HCL 5 MG PO TABS: 10.0000 mg | ORAL_TABLET | Freq: Four times a day (QID) | ORAL | 0 refills | Status: AC | PRN

## 2018-12-11 ENCOUNTER — Emergency Department: Payer: Medicare Other

## 2018-12-11 ENCOUNTER — Other Ambulatory Visit: Payer: Self-pay

## 2018-12-11 ENCOUNTER — Inpatient Hospital Stay
Admission: EM | Admit: 2018-12-11 | Discharge: 2018-12-20 | DRG: 871 | Disposition: A | Discharge status: Skilled Nursing Facility | Payer: Medicare Other | Attending: Internal Medicine | Admitting: Internal Medicine

## 2018-12-11 ENCOUNTER — Telehealth: Payer: Self-pay | Admitting: *Deleted

## 2018-12-11 DIAGNOSIS — N390 Urinary tract infection, site not specified: Secondary | ICD-10-CM

## 2018-12-11 DIAGNOSIS — N179 Acute kidney failure, unspecified: Secondary | ICD-10-CM | POA: Diagnosis present

## 2018-12-11 DIAGNOSIS — A419 Sepsis, unspecified organism: Principal | ICD-10-CM | POA: Diagnosis present

## 2018-12-11 DIAGNOSIS — Y95 Nosocomial condition: Secondary | ICD-10-CM | POA: Diagnosis present

## 2018-12-11 DIAGNOSIS — Z8546 Personal history of malignant neoplasm of prostate: Secondary | ICD-10-CM

## 2018-12-11 DIAGNOSIS — N189 Chronic kidney disease, unspecified: Secondary | ICD-10-CM | POA: Diagnosis not present

## 2018-12-11 DIAGNOSIS — Z743 Need for continuous supervision: Secondary | ICD-10-CM | POA: Diagnosis not present

## 2018-12-11 DIAGNOSIS — G893 Neoplasm related pain (acute) (chronic): Secondary | ICD-10-CM | POA: Diagnosis not present

## 2018-12-11 DIAGNOSIS — J441 Chronic obstructive pulmonary disease with (acute) exacerbation: Secondary | ICD-10-CM | POA: Diagnosis not present

## 2018-12-11 DIAGNOSIS — N476 Balanoposthitis: Secondary | ICD-10-CM | POA: Diagnosis not present

## 2018-12-11 DIAGNOSIS — M2550 Pain in unspecified joint: Secondary | ICD-10-CM | POA: Diagnosis not present

## 2018-12-11 DIAGNOSIS — Z23 Encounter for immunization: Secondary | ICD-10-CM | POA: Diagnosis present

## 2018-12-11 DIAGNOSIS — E43 Unspecified severe protein-calorie malnutrition: Secondary | ICD-10-CM | POA: Diagnosis present

## 2018-12-11 DIAGNOSIS — Z888 Allergy status to other drugs, medicaments and biological substances status: Secondary | ICD-10-CM

## 2018-12-11 DIAGNOSIS — R5381 Other malaise: Secondary | ICD-10-CM | POA: Diagnosis not present

## 2018-12-11 DIAGNOSIS — Z681 Body mass index (BMI) 19 or less, adult: Secondary | ICD-10-CM

## 2018-12-11 DIAGNOSIS — Z66 Do not resuscitate: Secondary | ICD-10-CM | POA: Diagnosis present

## 2018-12-11 DIAGNOSIS — K219 Gastro-esophageal reflux disease without esophagitis: Secondary | ICD-10-CM | POA: Diagnosis present

## 2018-12-11 DIAGNOSIS — E785 Hyperlipidemia, unspecified: Secondary | ICD-10-CM | POA: Diagnosis present

## 2018-12-11 DIAGNOSIS — Z9079 Acquired absence of other genital organ(s): Secondary | ICD-10-CM | POA: Diagnosis not present

## 2018-12-11 DIAGNOSIS — C679 Malignant neoplasm of bladder, unspecified: Secondary | ICD-10-CM | POA: Diagnosis present

## 2018-12-11 DIAGNOSIS — N5082 Scrotal pain: Secondary | ICD-10-CM | POA: Diagnosis present

## 2018-12-11 DIAGNOSIS — M069 Rheumatoid arthritis, unspecified: Secondary | ICD-10-CM | POA: Diagnosis present

## 2018-12-11 DIAGNOSIS — Z7951 Long term (current) use of inhaled steroids: Secondary | ICD-10-CM

## 2018-12-11 DIAGNOSIS — Z85118 Personal history of other malignant neoplasm of bronchus and lung: Secondary | ICD-10-CM

## 2018-12-11 DIAGNOSIS — Z9981 Dependence on supplemental oxygen: Secondary | ICD-10-CM | POA: Diagnosis not present

## 2018-12-11 DIAGNOSIS — J9610 Chronic respiratory failure, unspecified whether with hypoxia or hypercapnia: Secondary | ICD-10-CM | POA: Diagnosis present

## 2018-12-11 DIAGNOSIS — M6281 Muscle weakness (generalized): Secondary | ICD-10-CM | POA: Diagnosis not present

## 2018-12-11 DIAGNOSIS — R52 Pain, unspecified: Secondary | ICD-10-CM | POA: Diagnosis not present

## 2018-12-11 DIAGNOSIS — M1990 Unspecified osteoarthritis, unspecified site: Secondary | ICD-10-CM | POA: Diagnosis not present

## 2018-12-11 DIAGNOSIS — Z515 Encounter for palliative care: Secondary | ICD-10-CM

## 2018-12-11 DIAGNOSIS — J449 Chronic obstructive pulmonary disease, unspecified: Secondary | ICD-10-CM | POA: Diagnosis not present

## 2018-12-11 DIAGNOSIS — Z87891 Personal history of nicotine dependence: Secondary | ICD-10-CM

## 2018-12-11 DIAGNOSIS — G4700 Insomnia, unspecified: Secondary | ICD-10-CM | POA: Diagnosis present

## 2018-12-11 DIAGNOSIS — R0689 Other abnormalities of breathing: Secondary | ICD-10-CM | POA: Diagnosis not present

## 2018-12-11 DIAGNOSIS — Z8673 Personal history of transient ischemic attack (TIA), and cerebral infarction without residual deficits: Secondary | ICD-10-CM

## 2018-12-11 DIAGNOSIS — D631 Anemia in chronic kidney disease: Secondary | ICD-10-CM | POA: Diagnosis not present

## 2018-12-11 DIAGNOSIS — R1312 Dysphagia, oropharyngeal phase: Secondary | ICD-10-CM | POA: Diagnosis not present

## 2018-12-11 DIAGNOSIS — E7849 Other hyperlipidemia: Secondary | ICD-10-CM | POA: Diagnosis not present

## 2018-12-11 DIAGNOSIS — H9190 Unspecified hearing loss, unspecified ear: Secondary | ICD-10-CM | POA: Diagnosis present

## 2018-12-11 DIAGNOSIS — I129 Hypertensive chronic kidney disease with stage 1 through stage 4 chronic kidney disease, or unspecified chronic kidney disease: Secondary | ICD-10-CM | POA: Diagnosis not present

## 2018-12-11 DIAGNOSIS — Z79899 Other long term (current) drug therapy: Secondary | ICD-10-CM

## 2018-12-11 DIAGNOSIS — J440 Chronic obstructive pulmonary disease with acute lower respiratory infection: Secondary | ICD-10-CM | POA: Diagnosis present

## 2018-12-11 DIAGNOSIS — M0580 Other rheumatoid arthritis with rheumatoid factor of unspecified site: Secondary | ICD-10-CM | POA: Diagnosis not present

## 2018-12-11 DIAGNOSIS — E46 Unspecified protein-calorie malnutrition: Secondary | ICD-10-CM | POA: Diagnosis not present

## 2018-12-11 DIAGNOSIS — R32 Unspecified urinary incontinence: Secondary | ICD-10-CM | POA: Diagnosis present

## 2018-12-11 DIAGNOSIS — J189 Pneumonia, unspecified organism: Secondary | ICD-10-CM | POA: Diagnosis not present

## 2018-12-11 DIAGNOSIS — Z20828 Contact with and (suspected) exposure to other viral communicable diseases: Secondary | ICD-10-CM | POA: Diagnosis present

## 2018-12-11 DIAGNOSIS — R509 Fever, unspecified: Secondary | ICD-10-CM | POA: Diagnosis not present

## 2018-12-11 DIAGNOSIS — D630 Anemia in neoplastic disease: Secondary | ICD-10-CM | POA: Diagnosis not present

## 2018-12-11 DIAGNOSIS — I959 Hypotension, unspecified: Secondary | ICD-10-CM | POA: Diagnosis not present

## 2018-12-11 DIAGNOSIS — I69354 Hemiplegia and hemiparesis following cerebral infarction affecting left non-dominant side: Secondary | ICD-10-CM | POA: Diagnosis not present

## 2018-12-11 DIAGNOSIS — Z801 Family history of malignant neoplasm of trachea, bronchus and lung: Secondary | ICD-10-CM

## 2018-12-11 DIAGNOSIS — N50819 Testicular pain, unspecified: Secondary | ICD-10-CM

## 2018-12-11 LAB — CBC WITH DIFFERENTIAL/PLATELET
Abs Immature Granulocytes: 0.08 10*3/uL — ABNORMAL HIGH (ref 0.00–0.07)
Basophils Absolute: 0 10*3/uL (ref 0.0–0.1)
Basophils Relative: 0 %
Eosinophils Absolute: 0.1 10*3/uL (ref 0.0–0.5)
Eosinophils Relative: 1 %
HCT: 27.7 % — ABNORMAL LOW (ref 39.0–52.0)
Hemoglobin: 8.4 g/dL — ABNORMAL LOW (ref 13.0–17.0)
Immature Granulocytes: 1 %
Lymphocytes Relative: 9 %
Lymphs Abs: 0.9 10*3/uL (ref 0.7–4.0)
MCH: 27.8 pg (ref 26.0–34.0)
MCHC: 30.3 g/dL (ref 30.0–36.0)
MCV: 91.7 fL (ref 80.0–100.0)
Monocytes Absolute: 0.3 10*3/uL (ref 0.1–1.0)
Monocytes Relative: 3 %
Neutro Abs: 7.9 10*3/uL — ABNORMAL HIGH (ref 1.7–7.7)
Neutrophils Relative %: 86 %
Platelets: 272 10*3/uL (ref 150–400)
RBC: 3.02 MIL/uL — ABNORMAL LOW (ref 4.22–5.81)
RDW: 17 % — ABNORMAL HIGH (ref 11.5–15.5)
Smear Review: NORMAL
WBC: 9.3 10*3/uL (ref 4.0–10.5)
nRBC: 0 % (ref 0.0–0.2)

## 2018-12-11 LAB — COMPREHENSIVE METABOLIC PANEL
ALT: 13 U/L (ref 0–44)
AST: 33 U/L (ref 15–41)
Albumin: 2.4 g/dL — ABNORMAL LOW (ref 3.5–5.0)
Alkaline Phosphatase: 49 U/L (ref 38–126)
Anion gap: 8 (ref 5–15)
BUN: 42 mg/dL — ABNORMAL HIGH (ref 8–23)
CO2: 26 mmol/L (ref 22–32)
Calcium: 8.9 mg/dL (ref 8.9–10.3)
Chloride: 107 mmol/L (ref 98–111)
Creatinine, Ser: 1.35 mg/dL — ABNORMAL HIGH (ref 0.61–1.24)
GFR calc Af Amer: 57 mL/min — ABNORMAL LOW (ref >60)
GFR calc non Af Amer: 49 mL/min — ABNORMAL LOW (ref >60)
Glucose, Bld: 122 mg/dL — ABNORMAL HIGH (ref 70–99)
Potassium: 4.5 mmol/L (ref 3.5–5.1)
Sodium: 141 mmol/L (ref 135–145)
Total Bilirubin: 0.4 mg/dL (ref 0.3–1.2)
Total Protein: 8 g/dL (ref 6.5–8.1)

## 2018-12-11 LAB — URINALYSIS, COMPLETE (UACMP) WITH MICROSCOPIC
Bilirubin Urine: NEGATIVE
Glucose, UA: NEGATIVE mg/dL
Ketones, ur: NEGATIVE mg/dL
Nitrite: NEGATIVE
Protein, ur: 100 mg/dL — AB
Specific Gravity, Urine: 1.018 (ref 1.005–1.030)
Squamous Epithelial / HPF: NONE SEEN (ref 0–5)
pH: 7 (ref 5.0–8.0)

## 2018-12-11 LAB — LACTIC ACID, PLASMA: Lactic Acid, Venous: 1.6 mmol/L (ref 0.5–1.9)

## 2018-12-11 MED ORDER — LIDOCAINE HCL URETHRAL/MUCOSAL 2 % EX GEL
1.0000 "application " | Freq: Once | CUTANEOUS | Status: AC
  Administered 2018-12-11: 1 via URETHRAL
  Filled 2018-12-11: qty 10

## 2018-12-11 MED ORDER — SODIUM CHLORIDE 0.9 % IV SOLN: 2.0000 g | INTRAVENOUS | Status: DC

## 2018-12-11 MED ORDER — SODIUM CHLORIDE 0.9 % IV SOLN: 2.0000 g | Freq: Two times a day (BID) | INTRAVENOUS | Status: DC

## 2018-12-11 MED ORDER — ACETAMINOPHEN 500 MG PO TABS
1000.0000 mg | ORAL_TABLET | Freq: Once | ORAL | Status: AC
  Administered 2018-12-11: 1000 mg via ORAL
  Filled 2018-12-11: qty 2

## 2018-12-11 MED ORDER — VANCOMYCIN HCL IN DEXTROSE 1-5 GM/200ML-% IV SOLN
1000.0000 mg | Freq: Once | INTRAVENOUS | Status: AC
  Administered 2018-12-11: 1000 mg via INTRAVENOUS
  Filled 2018-12-11: qty 200

## 2018-12-11 MED ORDER — SODIUM CHLORIDE 0.9 % IV BOLUS
500.0000 mL | Freq: Once | INTRAVENOUS | Status: AC
  Administered 2018-12-11: 500 mL via INTRAVENOUS

## 2018-12-11 MED ORDER — FENTANYL CITRATE (PF) 100 MCG/2ML IJ SOLN
50.0000 ug | Freq: Once | INTRAMUSCULAR | Status: AC
  Administered 2018-12-11: 50 ug via INTRAVENOUS
  Filled 2018-12-11: qty 2

## 2018-12-11 MED ORDER — SODIUM CHLORIDE 0.9 % IV SOLN
2.0000 g | Freq: Once | INTRAVENOUS | Status: AC
  Administered 2018-12-11: 2 g via INTRAVENOUS
  Filled 2018-12-11: qty 2

## 2018-12-11 NOTE — ED Triage Notes (Signed)
PT to ED via EMS from home. PT c/o penile pain for a few days that got worse today. HX of bladder cancer and wound to penile region. Has had UTI in past. PT alert and at baseline at this time.

## 2018-12-11 NOTE — ED Notes (Signed)
PT cleansed of incontinent urine and stool. PT had feces caked into foreskin and urethra, pt also has multiple sores on testicles, bottom and hip.

## 2018-12-11 NOTE — Progress Notes (Signed)
Pharmacy Antibiotic Note  Collin Gonzales is a 83 y.o. male admitted on 12/11/2018 with UTI w/ h/o high-grade urothelial carcinoma (bladder cancer) w/ c/o penile pain that got worse and previous UTIs in the past.  Pharmacy has been consulted for vanc/cefepime dosing.  Plan: patient received vanc 1g IV load and cefepime 2g IV x 1 in ED  Patient appears to be in AKI currently w/ baseline Scr 0.78 - 1.18 Will draw random vanc level w/ am labs. Goal random < 20 mcg/mL  Will continue cefepime 2g IV q24h per CrCl 11 - 29 ml/min. Will continue to monitor and will as needed.  Height: 5\' 7"  (170.2 cm) Weight: 102 lb (46.3 kg) IBW/kg (Calculated) : 66.1  Temp (24hrs), Avg:100.3 F (37.9 C), Min:99.8 F (37.7 C), Max:100.8 F (38.2 C)  Recent Labs  Lab 12/06/18 1047 12/11/18 1840 12/11/18 1954  WBC 7.6 9.3  --   CREATININE 0.99 1.35*  --   LATICACIDVEN  --   --  1.6    Estimated Creatinine Clearance: 28.6 mL/min (A) (by C-G formula based on SCr of 1.35 mg/dL (H)).    Allergies  Allergen Reactions  . Plaquenil [Hydroxychloroquine] Other (See Comments)    Bad dreams  . Simvastatin Other (See Comments)    Bad dreams    Thank you for allowing pharmacy to be a part of this patient's care.  Tobie Lords, PharmD, BCPS Clinical Pharmacist 12/11/2018 11:27 PM

## 2018-12-11 NOTE — H&P (Signed)
[facility] [place] NAME: Collin Gonzales    MR#:  [id number]  DATE OF BIRTH:  [birth year]  DATE OF ADMISSION:  12/11/2018  PRIMARY CARE PHYSICIAN: [name], MD   REQUESTING/REFERRING PHYSICIAN: Cherylann Banas, MD  CHIEF COMPLAINT:   Chief Complaint  Patient presents with  . Groin Pain    HISTORY OF PRESENT ILLNESS:  Collin Gonzales  is a 83 y.o. male who presents with chief complaint as above.  Patient presents to the ED with a complaint of groin pain.  He states that he was recently seen by urology and was diagnosed with balanitis, and given nystatin cream.  He says that he has been applying this cream, but that he has not had improvement in his symptoms, rather they have gotten worse.  On evaluation in the ED tonight he was febrile initially, and has significant erythema in his groin region and his penis.  On lab evaluation he has likely UTI.  And on imaging he also has likely pneumonia.  Hospitalist were called for admission  PAST MEDICAL HISTORY:   Past Medical History:  Diagnosis Date  . Arthritis   . Collagen vascular disease (HCC)    rheumatoid arthritis  . COPD (chronic obstructive pulmonary disease) ([place])   . GERD (gastroesophageal reflux disease)   . History of prostate cancer   . HLD (hyperlipidemia)   . HOH (hard of hearing)   . Insomnia   . Oxygen deficit    2L HS AND PRN  . Primary cancer of right lung ([place]) 2017   rad tx's.  . Prostate cancer ([place]) 2006   Rad seed tx's.  . Stroke ([place])    2000  . Urothelial carcinoma of bladder ([place]) 10/2017   TURP to Prostate and chemo tx's.     PAST SURGICAL HISTORY:   Past Surgical History:  Procedure Laterality Date  . APPENDECTOMY  1965  . CATARACT EXTRACTION W/PHACO Right 01/05/2016   Procedure: CATARACT EXTRACTION PHACO AND INTRAOCULAR LENS PLACEMENT (IOC);  Surgeon: [name], MD;  Location: [facility];  Service: Ophthalmology;  Laterality: Right;   [place] 1.14 AP% 18.1 CDE 13.44 FLUID PACK LOT # [id number] H  . CYSTOSCOPY W/ RETROGRADES Bilateral 11/01/2017   Procedure: CYSTOSCOPY WITH RETROGRADE PYELOGRAM;  Surgeon: Hollice Espy, MD;  Location: [facility];  Service: Urology;  Laterality: Bilateral;  . ENDOBRONCHIAL ULTRASOUND N/A 02/26/2015   Procedure: ENDOBRONCHIAL ULTRASOUND;  Surgeon: Flora Lipps, MD;  Location: [facility];  Service: Cardiopulmonary;  Laterality: N/A;  . [place]  . INSERTION PROSTATE RADIATION SEED  2002  . PORTA CATH INSERTION N/A 05/14/2018   Procedure: PORTA CATH INSERTION;  Surgeon: Algernon Huxley, MD;  Location: [facility];  Service: Cardiovascular;  Laterality: N/A;  . TRANSURETHRAL RESECTION OF PROSTATE N/A 11/01/2017   Procedure: TRANSURETHRAL RESECTION OF THE PROSTATE (TURP) (channel TURP);  Surgeon: Hollice Espy, MD;  Location: [facility];  Service: Urology;  Laterality: N/A;     SOCIAL HISTORY:   Social History   Tobacco Use  . Smoking status: Former Smoker    Packs/day: 0.50    Years: 50.00    Pack years: 25.00    Types: Cigarettes    Quit date: 02/21/2013    Years since quitting: 5.8  . Smokeless tobacco: Never Used  . Tobacco comment: smoking cessation materials not required  Substance Use Topics  . Alcohol use: No    Alcohol/week: 0.0 standard drinks  FAMILY HISTORY:   Family History  Problem Relation Age of Onset  . Cancer Mother   . Cancer Father        Lung Cancer  . Cancer Sister        breast  . Cancer Brother        stomach     DRUG ALLERGIES:   Allergies  Allergen Reactions  . Plaquenil [Hydroxychloroquine] Other (See Comments)    Bad dreams  . Simvastatin Other (See Comments)    Bad dreams    MEDICATIONS AT HOME:   Prior to Admission medications   Medication Sig Start Date End Date Taking? Authorizing Provider  cephALEXin (KEFLEX) 500 MG capsule Take 1 capsule (500 mg total) by mouth 2 (two) times daily. 12/06/18   McGowan, Hunt Oris, PA-C   Fluticasone-Umeclidin-Vilant (TRELEGY ELLIPTA) 100-62.5-25 MCG/INH AEPB Inhale 1 puff into the lungs daily. 10/22/18   [name], MD  megestrol (MEGACE) 20 MG tablet Take 1 tablet (20 mg total) by mouth 2 (two) times daily. 03/30/18   [name], MD  methotrexate (RHEUMATREX) 2.5 MG tablet Take 4 tablets by mouth every Thursday.  10/05/17   [name], MD  nystatin cream (MYCOSTATIN) Apply 1 application topically 2 (two) times daily. 12/06/18   [name] A, PA-C  oxyCODONE (OXY IR/ROXICODONE) 5 MG immediate release tablet Take 2 tablets (10 mg total) by mouth every 6 (six) hours as needed for severe pain. 12/08/18   Lloyd Huger, MD  OXYGEN Place 4 L into the nose daily as needed (shortness of breath).     [provider]  QUEtiapine (SEROQUEL) 50 MG tablet Take 1 tablet (50 mg total) by mouth at bedtime. 10/22/18   [name], MD    REVIEW OF SYSTEMS:  Review of Systems  Constitutional: Positive for fever and malaise/fatigue. Negative for chills and weight loss.  HENT: Negative for ear pain, hearing loss and tinnitus.   Eyes: Negative for blurred vision, double vision, pain and redness.  Respiratory: Negative for cough, hemoptysis and shortness of breath.   Cardiovascular: Negative for chest pain, palpitations, orthopnea and leg swelling.  Gastrointestinal: Negative for abdominal pain, constipation, diarrhea, nausea and vomiting.  Genitourinary: Positive for dysuria. Negative for frequency and hematuria.  Musculoskeletal: Negative for back pain, joint pain and neck pain.  Skin:       Erythema of the groin region  Neurological: Negative for dizziness, tremors, focal weakness and weakness.  Endo/Heme/Allergies: Negative for polydipsia. Does not bruise/bleed easily.  Psychiatric/Behavioral: Negative for depression. The patient is not nervous/anxious and does not have insomnia.      VITAL SIGNS:   Vitals:   12/11/18 1824 12/11/18 1831 12/11/18  2013 12/11/18 2129  BP:  (!) 98/59 111/63 111/64  Pulse:  96 93 84  Resp:  20 (!) 22 20  Temp:  (!) 100.8 F (38.2 C)    TempSrc:  Oral    SpO2:  100% 100% 94%  Weight: 46.3 kg     Height: 5\' 7"  (1.702 m)      Wt Readings from Last 3 Encounters:  12/11/18 46.3 kg  12/06/18 47.2 kg  12/06/18 47.2 kg    PHYSICAL EXAMINATION:  Physical Exam  Vitals reviewed. Constitutional: He is oriented to person, place, and time. He appears well-developed and well-nourished. No distress.  HENT:  Head: Normocephalic and atraumatic.  Mouth/Throat: Oropharynx is clear and moist.  Eyes: Pupils are equal, round, and reactive to light. Conjunctivae and EOM are normal.  No scleral icterus.  Neck: Normal range of motion. Neck supple. No JVD present. No thyromegaly present.  Cardiovascular: Normal rate, regular rhythm and intact distal pulses. Exam reveals no gallop and no friction rub.  No murmur heard. Respiratory: Effort normal. No respiratory distress. He has no wheezes. He has no rales.  Basilar coarse breath sounds  GI: Soft. Bowel sounds are normal. He exhibits no distension. There is no abdominal tenderness.  Musculoskeletal: Normal range of motion.        General: No edema.     Comments: No arthritis, no gout  Lymphadenopathy:    He has no cervical adenopathy.  Neurological: He is alert and oriented to person, place, and time. No cranial nerve deficit.  No dysarthria, no aphasia  Skin: Skin is warm and dry. No rash noted. There is erythema (Of the penis and groin).  Psychiatric: He has a normal mood and affect. His behavior is normal. Judgment and thought content normal.    LABORATORY PANEL:   CBC Recent Labs  Lab 12/11/18 1840  WBC 9.3  HGB 8.4*  HCT 27.7*  PLT 272   ------------------------------------------------------------------------------------------------------------------  Chemistries  Recent Labs  Lab 12/11/18 1840  NA 141  K 4.5  CL 107  CO2 26  GLUCOSE 122*   BUN 42*  CREATININE 1.35*  CALCIUM 8.9  AST 33  ALT 13  ALKPHOS 49  BILITOT 0.4   ------------------------------------------------------------------------------------------------------------------  Cardiac Enzymes No results for input(s): TROPONINI in the last 168 hours. ------------------------------------------------------------------------------------------------------------------  RADIOLOGY:  Dg Chest Port 1 View  Result Date: 12/11/2018 CLINICAL DATA:  Fever EXAM: PORTABLE CHEST 1 VIEW COMPARISON:  October 28, 2018 FINDINGS: There is underlying emphysematous change with fibrosis throughout the lower lobes. There is patchy infiltrate superimposed on fibrosis in the right base. The heart size is normal. The pulmonary vascularity reflects the underlying emphysematous change. No adenopathy. Port-A-Cath tip is in the superior vena cava. There is a skin fold on the left but no evident pneumothorax. No bone lesions. IMPRESSION: Airspace opacity consistent with pneumonia in the right base, superimposed on emphysematous change in fibrosis. Heart size within normal limits. Stable pulmonary vascularity. No adenopathy evident. Port-A-Cath tip in superior vena cava.  Skin fold on left. Followup PA and lateral chest radiographs recommended in 3-4 weeks following trial of antibiotic therapy to ensure resolution and exclude underlying malignancy. Electronically Signed   By: Lowella Grip III M.D.   On: 12/11/2018 19:55    EKG:   Orders placed or performed during the hospital encounter of 10/26/18  . ED EKG  . ED EKG    IMPRESSION AND PLAN:  Principal Problem:   Balanoposthitis -IV antibiotics, continue antifungal as well Active Problems:   UTI (urinary tract infection) -IV antibiotics, urine culture sent   HCAP (healthcare-associated pneumonia) -IV antibiotics, as needed supportive treatment   AKI (acute kidney injury) ([place]) -IV fluids, avoid nephrotoxins and monitor   COPD (chronic  obstructive pulmonary disease) (HCC) -home dose inhalers   Arthritis or polyarthritis, rheumatoid ([place]) -home dose methotrexate  Chart review performed and case discussed with ED provider. Labs, imaging and/or ECG reviewed by provider and discussed with patient/family. Management plans discussed with the patient and/or family.  COVID-19 status: Pending  DVT PROPHYLAXIS: SubQ heparin  GI PROPHYLAXIS:  None  ADMISSION STATUS: Inpatient     CODE STATUS: DNR Code Status History    Date Active Date Inactive Code Status Order ID Comments User Context   10/29/2018 6440 10/31/2018 2148 DNR [id number]  Harrie Foreman, MD Inpatient   10/27/2018 0603 10/29/2018 0243 Full Code [id number]  Harrie Foreman, MD Inpatient   11/01/2017 1548 11/02/2017 1728 Full Code [id number]  Hollice Espy, MD Inpatient   03/01/2017 2316 03/04/2017 2023 DNR [id number]  Vaughan Basta, MD Inpatient   08/18/2015 1312 08/20/2015 1437 DNR [id number]  Loletha Grayer, MD ED   Advance Care Planning Activity    Questions for Most Recent Historical Code Status (Order [id number])    Question Answer Comment   In the event of cardiac or respiratory ARREST Do not call a "code blue"    In the event of cardiac or respiratory ARREST Do not perform Intubation, CPR, defibrillation or ACLS    In the event of cardiac or respiratory ARREST Use medication by any route, position, wound care, and other measures to relive pain and suffering. May use oxygen, suction and manual treatment of airway obstruction as needed for comfort.       TOTAL TIME TAKING CARE OF THIS PATIENT: 45 minutes.   This patient was evaluated in the context of the global COVID-19 pandemic, which necessitated consideration that the patient might be at risk for infection with the SARS-CoV-2 virus that causes COVID-19. Institutional protocols and algorithms that pertain to the evaluation of patients at risk for COVID-19 are in a state of rapid change based on  information released by regulatory bodies including the CDC and federal and state organizations. These policies and algorithms were followed to the best of this provider's knowledge to date during the patient's care at this facility.  Ethlyn Daniels 12/11/2018, 10:48 PM  [facility]  Office  [phone]  CC: Primary care physician; [name], MD  Note:  This document was prepared using Dragon voice recognition software and may include unintentional dictation errors.

## 2018-12-11 NOTE — Progress Notes (Signed)
PHARMACY -  BRIEF ANTIBIOTIC NOTE   Pharmacy has received consult(s) for Vancomycin , Cefepime from an ED provider.  The patient's profile has been reviewed for ht/wt/allergies/indication/available labs.    One time order(s) placed for Cefepime 2 gm IV X 1 and Vancomycin 1 gm IV X 1   Further antibiotics/pharmacy consults should be ordered by admitting physician if indicated.                       Thank you, Marrietta Thunder D 12/11/2018  9:26 PM

## 2018-12-11 NOTE — ED Provider Notes (Signed)
[facility] Emergency Department Provider Note ____________________________________________   First MD Initiated Contact with Patient 12/11/18 1849     (approximate)  I have reviewed the triage vital signs and the nursing notes.   HISTORY  Chief Complaint Groin Pain    HPI Collin Gonzales is a 83 y.o. male with PMH as noted below including a history of bladder and prostate cancer who presents with penile pain which he states has been present for weeks, but has been acutely worse over the last couple of days.  The patient states that he was seen for this last week and given a cream to put on it, but it is not helping.  He states the pain is worse when he urinates, but it is present when he is not urinating.  He denies any difficulty urinating.  Past Medical History:  Diagnosis Date  . Arthritis   . Collagen vascular disease (HCC)    rheumatoid arthritis  . COPD (chronic obstructive pulmonary disease) ([place])   . GERD (gastroesophageal reflux disease)   . History of prostate cancer   . HLD (hyperlipidemia)   . HOH (hard of hearing)   . Insomnia   . Oxygen deficit    2L HS AND PRN  . Primary cancer of right lung ([place]) 2017   rad tx's.  . Prostate cancer ([place]) 2006   Rad seed tx's.  . Stroke ([place])    2000  . Urothelial carcinoma of bladder ([place]) 10/2017   TURP to Prostate and chemo tx's.    Patient Active Problem List   Diagnosis Date Noted  . Balanoposthitis 12/11/2018  . AKI (acute kidney injury) ([place]) 12/11/2018  . Rhabdomyolysis 10/27/2018  . Aortic atherosclerosis ([place]) 07/20/2018  . Thrombocytopenia, unspecified ([place]) 07/20/2018  . Urothelial carcinoma ([place]) 07/20/2018  . Urothelial carcinoma of bladder ([place]) 11/12/2017  . Urinary retention 11/01/2017  . Dependence on supplemental oxygen 04/24/2017  . Malnutrition ([place]) 04/24/2017  . Hemiparesis affecting left side as late effect of cerebrovascular accident (CVA) ([place]) 04/24/2017  .  HCAP (healthcare-associated pneumonia) 03/01/2017  . UTI (urinary tract infection) 09/24/2016  . Cataract 09/06/2016  . Drug-induced folate deficiency anemia 09/06/2016  . Esophagitis, reflux 09/06/2016  . Iron deficiency anemia due to chronic blood loss 08/23/2016  . Decrease in appetite 09/01/2015  . GERD (gastroesophageal reflux disease) 08/05/2015  . COPD (chronic obstructive pulmonary disease) ([place]) 08/20/2014  . Dyslipidemia 08/20/2014  . Chronic insomnia 08/20/2014  . Arthritis or polyarthritis, rheumatoid ([place]) 08/20/2014  . History of prostate cancer 11/01/2013  . Flutter-fibrillation ([place]) 03/18/2005    Past Surgical History:  Procedure Laterality Date  . APPENDECTOMY  1965  . CATARACT EXTRACTION W/PHACO Right 01/05/2016   Procedure: CATARACT EXTRACTION PHACO AND INTRAOCULAR LENS PLACEMENT (IOC);  Surgeon: [name], MD;  Location: [facility] ORS;  Service: Ophthalmology;  Laterality: Right;  [place] 1.14 AP% 18.1 CDE 13.44 FLUID PACK LOT # [id number]  . CYSTOSCOPY W/ RETROGRADES Bilateral 11/01/2017   Procedure: CYSTOSCOPY WITH RETROGRADE PYELOGRAM;  Surgeon: Hollice Espy, MD;  Location: [facility] ORS;  Service: Urology;  Laterality: Bilateral;  . ENDOBRONCHIAL ULTRASOUND N/A 02/26/2015   Procedure: ENDOBRONCHIAL ULTRASOUND;  Surgeon: Flora Lipps, MD;  Location: [facility] ORS;  Service: Cardiopulmonary;  Laterality: N/A;  . [place]  . INSERTION PROSTATE RADIATION SEED  2002  . PORTA CATH INSERTION N/A 05/14/2018   Procedure: PORTA CATH INSERTION;  Surgeon: Algernon Huxley, MD;  Location: [facility];  Service: Cardiovascular;  Laterality: N/A;  . TRANSURETHRAL RESECTION OF PROSTATE N/A 11/01/2017   Procedure: TRANSURETHRAL RESECTION OF THE PROSTATE (TURP) (channel TURP);  Surgeon: Hollice Espy, MD;  Location: [facility] ORS;  Service: Urology;  Laterality: N/A;    Prior to Admission medications   Medication Sig Start Date End Date Taking? Authorizing Provider   Fluticasone-Umeclidin-Vilant (TRELEGY ELLIPTA) 100-62.5-25 MCG/INH AEPB Inhale 1 puff into the lungs daily. 10/22/18  Yes Sowles, Drue Stager, MD  megestrol (MEGACE) 20 MG tablet Take 1 tablet (20 mg total) by mouth 2 (two) times daily. 03/30/18  Yes Sowles, Drue Stager, MD  methotrexate (RHEUMATREX) 2.5 MG tablet Take 4 tablets by mouth every Thursday.  10/05/17  Yes [name], MD  nystatin cream (MYCOSTATIN) Apply 1 application topically 2 (two) times daily. 12/06/18  Yes McGowan, Larene Beach A, PA-C  oxyCODONE (OXY IR/ROXICODONE) 5 MG immediate release tablet Take 2 tablets (10 mg total) by mouth every 6 (six) hours as needed for severe pain. 12/08/18  Yes Lloyd Huger, MD  QUEtiapine (SEROQUEL) 50 MG tablet Take 1 tablet (50 mg total) by mouth at bedtime. 10/22/18  Yes Sowles, Drue Stager, MD  cephALEXin (KEFLEX) 500 MG capsule Take 1 capsule (500 mg total) by mouth 2 (two) times daily. Patient not taking: Reported on 12/11/2018 12/06/18   [name] A, PA-C  OXYGEN Place 4 L into the nose daily as needed (shortness of breath).     [provider]    Allergies Plaquenil [hydroxychloroquine] and Simvastatin  Family History  Problem Relation Age of Onset  . Cancer Mother   . Cancer Father        Lung Cancer  . Cancer Sister        breast  . Cancer Brother        stomach    Social History Social History   Tobacco Use  . Smoking status: Former Smoker    Packs/day: 0.50    Years: 50.00    Pack years: 25.00    Types: Cigarettes    Quit date: 02/21/2013    Years since quitting: 5.8  . Smokeless tobacco: Never Used  . Tobacco comment: smoking cessation materials not required  Substance Use Topics  . Alcohol use: No    Alcohol/week: 0.0 standard drinks  . Drug use: No    Review of Systems  Constitutional: Positive for fever Eyes: No redness. ENT: No sore throat. Cardiovascular: Denies chest pain. Respiratory: Denies shortness of breath. Gastrointestinal: No  vomiting or diarrhea.  Genitourinary: Positive for dysuria.  Musculoskeletal: Negative for back pain. Skin: Negative for rash. Neurological: Negative for headache.   ____________________________________________   PHYSICAL EXAM:  VITAL SIGNS: ED Triage Vitals  Enc Vitals Group     BP 12/11/18 1831 (!) 98/59     Pulse Rate 12/11/18 1831 96     Resp 12/11/18 1831 20     Temp 12/11/18 1831 (!) 100.8 F (38.2 C)     Temp Source 12/11/18 1831 Oral     SpO2 12/11/18 1822 95 %     Weight 12/11/18 1824 102 lb (46.3 kg)     Height 12/11/18 1824 5\' 7"  (1.702 m)     Head Circumference --      [place] Flow --      Pain Score 12/11/18 1824 7     Pain Loc --      Pain Edu? --      Excl. in [place]? --     Constitutional: Alert and oriented.  Slightly uncomfortable  appearing but in no acute distress. Eyes: Conjunctivae are normal.  Head: Atraumatic. Nose: No congestion/rhinnorhea. Mouth/Throat: Mucous membranes are moist.   Neck: Normal range of motion.  Cardiovascular:  Good peripheral circulation. Respiratory: Normal respiratory effort.  No retractions.  Gastrointestinal: Soft and nontender. No distention.  Genitourinary: Foreskin appears slightly swollen and erythematous, with a few superficial erosions of the skin distally and some clear/yellow discharge.  There is no penile swelling.  The foreskin can be retracted normally.  No testicular swelling or tenderness.  No crepitus, bogginess, or surrounding soft tissue tenderness. Musculoskeletal: No lower extremity edema.  Extremities warm and well perfused.  Neurologic:  Normal speech and language. No gross focal neurologic deficits are appreciated.  Skin:  Skin is warm and dry. No rash noted. Psychiatric: Mood and affect are normal. Speech and behavior are normal.  ____________________________________________   LABS (all labs ordered are listed, but only abnormal results are displayed)  Labs Reviewed  COMPREHENSIVE METABOLIC PANEL -  Abnormal; Notable for the following components:      Result Value   Glucose, Bld 122 (*)    BUN 42 (*)    Creatinine, Ser 1.35 (*)    Albumin 2.4 (*)    GFR calc non Af Amer 49 (*)    GFR calc Af Amer 57 (*)    All other components within normal limits  CBC WITH DIFFERENTIAL/PLATELET - Abnormal; Notable for the following components:   RBC 3.02 (*)    Hemoglobin 8.4 (*)    HCT 27.7 (*)    RDW 17.0 (*)    Neutro Abs 7.9 (*)    Abs Immature Granulocytes 0.08 (*)    All other components within normal limits  URINALYSIS, COMPLETE (UACMP) WITH MICROSCOPIC - Abnormal; Notable for the following components:   Color, Urine YELLOW (*)    APPearance TURBID (*)    Hgb urine dipstick SMALL (*)    Protein, ur 100 (*)    Leukocytes,Ua MODERATE (*)    Bacteria, UA MANY (*)    All other components within normal limits  CULTURE, BLOOD (ROUTINE X 2)  CULTURE, BLOOD (ROUTINE X 2)  URINE CULTURE  SARS CORONAVIRUS 2 (TAT 6-24 HRS)  LACTIC ACID, PLASMA  LACTIC ACID, PLASMA  VANCOMYCIN, RANDOM   ____________________________________________  EKG   ____________________________________________  RADIOLOGY  CXR: Opacity consistent with pneumonia in the right base  ____________________________________________   PROCEDURES  Procedure(s) performed: No  Procedures  Critical Care performed: No ____________________________________________   INITIAL IMPRESSION / ASSESSMENT AND PLAN / ED COURSE  Pertinent labs & imaging results that were available during my care of the patient were reviewed by me and considered in my medical decision making (see chart for details).  83 year old male with PMH as noted above presents with penile pain and dysuria which has worsened over the last 2 days.  He denies any urinary retention.  I reviewed the past medical records in [place].  The patient was admitted last month with acute cystitis, rhabdomyolysis, dehydration.  He was seen at [facility] on 12/06/2018 for penile pain and swelling and was diagnosed with balanoposthitis at that time and started on nystatin cream.  On exam today, the patient has a low-grade fever with otherwise normal vital signs.  He has no abdominal or suprapubic tenderness.  When the RN initially evaluated the patient, he had urine and feces on the penile tip and foreskin and did not appear to be adequately cleaned.  On my exam there continued  to be urine and some discharge at the tip of the penis.  The foreskin and glans appear erythematous and inflamed although there is no significant penile swelling and the foreskin can be retracted.  Overall I suspect most likely recurrent UTI/cystitis with dysuria, versus possible worsening balanoposthitis and/or penile cellulitis.  We will obtain lab work-up including catheterized urine for a clean sample, chest x-ray to evaluate for other etiologies of fever, and reassess.  Anticipate likely admission.  ----------------------------------------- 9:31 PM on 12/11/2018 -----------------------------------------  The urinalysis is consistent with a UTI.  Chest x-ray also shows a new infiltrate.  Since the patient was admitted a month ago, I will order broad-spectrum antibiotics for HCAP which will also cover for UTI and possible cellulitis.   ----------------------------------------- 11:28 PM on 12/11/2018 -----------------------------------------  Patient signed out to the hospitalist Dr. Jannifer Franklin.  ___________________________  [name] was evaluated in Emergency Department on 12/11/2018 for the symptoms described in the history of present illness. He was evaluated in the context of the global COVID-19 pandemic, which necessitated consideration that the patient might be at risk for infection with the SARS-CoV-2 virus that causes COVID-19. Institutional protocols and algorithms that pertain to the evaluation of patients at risk for COVID-19 are in a state  of rapid change based on information released by regulatory bodies including the CDC and federal and state organizations. These policies and algorithms were followed during the patient's care in the ED. ____________________________________________   FINAL CLINICAL IMPRESSION(S) / ED DIAGNOSES  Final diagnoses:  Urinary tract infection without hematuria, site unspecified  Balanoposthitis      NEW MEDICATIONS STARTED DURING THIS VISIT:  New Prescriptions   No medications on file     Note:  This document was prepared using Dragon voice recognition software and may include unintentional dictation errors.    Arta Silence, MD 12/11/18 2328

## 2018-12-11 NOTE — Telephone Encounter (Signed)
Home Health nurse called reporting that patient is having lower abdominal and penile pain and that his Oxycodone is not relieving the pain and she is asking what else can be done for patient. Please advise

## 2018-12-11 NOTE — Telephone Encounter (Signed)
His penile pain was secondary to swelling which urology graciously saw him on the same day last week. Prior to changing meds, recommend calling them first to see if anything more can be done.

## 2018-12-11 NOTE — Telephone Encounter (Signed)
Left message on Trisha's voice mail to contact Urology regarding this

## 2018-12-12 DIAGNOSIS — Z515 Encounter for palliative care: Secondary | ICD-10-CM | POA: Diagnosis not present

## 2018-12-12 DIAGNOSIS — G893 Neoplasm related pain (acute) (chronic): Secondary | ICD-10-CM

## 2018-12-12 DIAGNOSIS — C679 Malignant neoplasm of bladder, unspecified: Secondary | ICD-10-CM

## 2018-12-12 DIAGNOSIS — Z87891 Personal history of nicotine dependence: Secondary | ICD-10-CM

## 2018-12-12 LAB — BASIC METABOLIC PANEL
Anion gap: 7 (ref 5–15)
BUN: 41 mg/dL — ABNORMAL HIGH (ref 8–23)
CO2: 25 mmol/L (ref 22–32)
Calcium: 8.8 mg/dL — ABNORMAL LOW (ref 8.9–10.3)
Chloride: 109 mmol/L (ref 98–111)
Creatinine, Ser: 1.16 mg/dL (ref 0.61–1.24)
GFR calc Af Amer: >60 mL/min (ref >60)
GFR calc non Af Amer: 59 mL/min — ABNORMAL LOW (ref >60)
Glucose, Bld: 96 mg/dL (ref 70–99)
Potassium: 4.7 mmol/L (ref 3.5–5.1)
Sodium: 141 mmol/L (ref 135–145)

## 2018-12-12 LAB — CBC
HCT: 28.6 % — ABNORMAL LOW (ref 39.0–52.0)
Hemoglobin: 8.6 g/dL — ABNORMAL LOW (ref 13.0–17.0)
MCH: 28 pg (ref 26.0–34.0)
MCHC: 30.1 g/dL (ref 30.0–36.0)
MCV: 93.2 fL (ref 80.0–100.0)
Platelets: 273 10*3/uL (ref 150–400)
RBC: 3.07 MIL/uL — ABNORMAL LOW (ref 4.22–5.81)
RDW: 17.1 % — ABNORMAL HIGH (ref 11.5–15.5)
WBC: 8.1 10*3/uL (ref 4.0–10.5)
nRBC: 0 % (ref 0.0–0.2)

## 2018-12-12 LAB — URINE CULTURE

## 2018-12-12 LAB — SARS CORONAVIRUS 2 (TAT 6-24 HRS): SARS Coronavirus 2: NEGATIVE

## 2018-12-12 LAB — MRSA PCR SCREENING: MRSA by PCR: NEGATIVE

## 2018-12-12 LAB — VANCOMYCIN, RANDOM: Vancomycin Rm: 11

## 2018-12-12 LAB — LACTIC ACID, PLASMA: Lactic Acid, Venous: 1.4 mmol/L (ref 0.5–1.9)

## 2018-12-12 MED ORDER — MORPHINE SULFATE (PF) 2 MG/ML IV SOLN
2.0000 mg | INTRAVENOUS | Status: DC | PRN
  Administered 2018-12-12 – 2018-12-19 (×13): 2 mg via INTRAVENOUS
  Filled 2018-12-12 (×15): qty 1

## 2018-12-12 MED ORDER — SODIUM CHLORIDE 0.9 % IV SOLN
INTRAVENOUS | Status: DC
  Administered 2018-12-12 – 2018-12-14 (×4): via INTRAVENOUS

## 2018-12-12 MED ORDER — SODIUM CHLORIDE 0.9 % IV SOLN
2.0000 g | Freq: Two times a day (BID) | INTRAVENOUS | Status: DC
  Administered 2018-12-12 – 2018-12-13 (×4): 2 g via INTRAVENOUS
  Filled 2018-12-12 (×8): qty 2

## 2018-12-12 MED ORDER — GERHARDT'S BUTT CREAM
TOPICAL_CREAM | Freq: Three times a day (TID) | CUTANEOUS | Status: DC
  Administered 2018-12-12 – 2018-12-20 (×23): via TOPICAL
  Filled 2018-12-12 (×2): qty 1

## 2018-12-12 MED ORDER — METHOTREXATE 2.5 MG PO TABS
10.0000 mg | ORAL_TABLET | ORAL | Status: DC
  Filled 2018-12-12: qty 4

## 2018-12-12 MED ORDER — ENSURE ENLIVE PO LIQD
237.0000 mL | Freq: Two times a day (BID) | ORAL | Status: DC
  Administered 2018-12-13 – 2018-12-20 (×14): 237 mL via ORAL

## 2018-12-12 MED ORDER — NYSTATIN 100000 UNIT/GM EX CREA
1.0000 "application " | TOPICAL_CREAM | Freq: Two times a day (BID) | CUTANEOUS | Status: DC
  Filled 2018-12-12: qty 15

## 2018-12-12 MED ORDER — SODIUM CHLORIDE 0.9 % IV SOLN
INTRAVENOUS | Status: AC
  Administered 2018-12-12: 06:00:00 via INTRAVENOUS

## 2018-12-12 MED ORDER — FLUTICASONE FUROATE-VILANTEROL 100-25 MCG/INH IN AEPB
1.0000 | INHALATION_SPRAY | Freq: Every day | RESPIRATORY_TRACT | Status: DC
  Administered 2018-12-12 – 2018-12-20 (×9): 1 via RESPIRATORY_TRACT
  Filled 2018-12-12 (×3): qty 28

## 2018-12-12 MED ORDER — OXYCODONE HCL 5 MG PO TABS
10.0000 mg | ORAL_TABLET | ORAL | Status: DC | PRN
  Administered 2018-12-13 – 2018-12-19 (×11): 10 mg via ORAL
  Filled 2018-12-12 (×12): qty 2

## 2018-12-12 MED ORDER — MEGESTROL ACETATE 20 MG PO TABS
20.0000 mg | ORAL_TABLET | Freq: Two times a day (BID) | ORAL | Status: DC
  Administered 2018-12-12 – 2018-12-20 (×17): 20 mg via ORAL
  Filled 2018-12-12 (×18): qty 1

## 2018-12-12 MED ORDER — ONDANSETRON HCL 4 MG/2ML IJ SOLN: 4.0000 mg | Freq: Four times a day (QID) | INTRAMUSCULAR | Status: DC | PRN

## 2018-12-12 MED ORDER — ACETAMINOPHEN 650 MG RE SUPP: 650.0000 mg | Freq: Four times a day (QID) | RECTAL | Status: DC | PRN

## 2018-12-12 MED ORDER — OXYCODONE HCL 5 MG PO TABS
5.0000 mg | ORAL_TABLET | Freq: Four times a day (QID) | ORAL | Status: DC | PRN
  Administered 2018-12-12 (×2): 5 mg via ORAL
  Filled 2018-12-12 (×2): qty 1

## 2018-12-12 MED ORDER — FLUTICASONE-UMECLIDIN-VILANT 100-62.5-25 MCG/INH IN AEPB: 1.0000 | INHALATION_SPRAY | Freq: Every day | RESPIRATORY_TRACT | Status: DC

## 2018-12-12 MED ORDER — ENOXAPARIN SODIUM 30 MG/0.3ML ~~LOC~~ SOLN
30.0000 mg | SUBCUTANEOUS | Status: DC
  Administered 2018-12-12 – 2018-12-16 (×5): 30 mg via SUBCUTANEOUS
  Filled 2018-12-12 (×5): qty 0.3

## 2018-12-12 MED ORDER — UMECLIDINIUM BROMIDE 62.5 MCG/INH IN AEPB
1.0000 | INHALATION_SPRAY | Freq: Every day | RESPIRATORY_TRACT | Status: DC
  Administered 2018-12-12 – 2018-12-20 (×9): 1 via RESPIRATORY_TRACT
  Filled 2018-12-12 (×2): qty 7

## 2018-12-12 MED ORDER — QUETIAPINE FUMARATE 25 MG PO TABS
50.0000 mg | ORAL_TABLET | Freq: Every day | ORAL | Status: DC
  Administered 2018-12-12 – 2018-12-19 (×8): 50 mg via ORAL
  Filled 2018-12-12 (×9): qty 2

## 2018-12-12 MED ORDER — VANCOMYCIN HCL IN DEXTROSE 750-5 MG/150ML-% IV SOLN
750.0000 mg | INTRAVENOUS | Status: DC
  Administered 2018-12-12: 750 mg via INTRAVENOUS
  Filled 2018-12-12 (×2): qty 150

## 2018-12-12 MED ORDER — ONDANSETRON HCL 4 MG PO TABS: 4.0000 mg | ORAL_TABLET | Freq: Four times a day (QID) | ORAL | Status: DC | PRN

## 2018-12-12 MED ORDER — ACETAMINOPHEN 325 MG PO TABS
650.0000 mg | ORAL_TABLET | Freq: Four times a day (QID) | ORAL | Status: DC | PRN
  Administered 2018-12-16: 650 mg via ORAL
  Filled 2018-12-12: qty 2

## 2018-12-12 NOTE — Progress Notes (Signed)
Initial Nutrition Assessment  DOCUMENTATION CODES:   Severe malnutrition in context of chronic illness, Underweight  INTERVENTION:  Recommend liberalizing diet to regular.  Provide Ensure Enlive po BID, each supplement provides 350 kcal and 20 grams of protein.  Provide Magic cup TID with meals, each supplement provides 290 kcal and 9 grams of protein.  NUTRITION DIAGNOSIS:   Severe Malnutrition related to chronic illness(COPD, urothelial carcinoma of bladder) as evidenced by severe fat depletion, severe muscle depletion.  GOAL:   Patient will meet greater than or equal to 90% of their needs  MONITOR:   PO intake, Supplement acceptance, Labs, Weight trends, Skin, I & O's  REASON FOR ASSESSMENT:   Malnutrition Screening Tool    ASSESSMENT:   83 year old male with PMHx of GERD, COPD, prostate cancer, hx CVA, HLD, arthritis, lung cancer s/p XRT, urothelial carcinoma of bladder admitted with sepsis, balanoposthitis, PNA, AKI.   Served as Producer, television/film/video with MD this AM for her exam. Returned later this afternoon for nutrition/weight history but patient was sleeping and unable to provide any history. Discussed with RN. Patient would benefit from liberalized diet and oral nutrition supplementation.  Medications reviewed and include: Megace 20 mg BID, methotrexate, Seroquel, NS at 75 mL/hr, cefepime, vancomycin.  Labs reviewed: BUN 41.  NUTRITION - FOCUSED PHYSICAL EXAM:    Most Recent Value  Orbital Region  Severe depletion  Upper Arm Region  Severe depletion  Thoracic and Lumbar Region  Severe depletion  Buccal Region  Severe depletion  Temple Region  Severe depletion  Clavicle Bone Region  Severe depletion  Clavicle and Acromion Bone Region  Severe depletion  Scapular Bone Region  Severe depletion  Dorsal Hand  Severe depletion  Patellar Region  Severe depletion  Anterior Thigh Region  Severe depletion  Posterior Calf Region  Severe depletion  Edema (RD Assessment)  None   Hair  Reviewed  Eyes  Reviewed  Mouth  Reviewed  Skin  Reviewed  Nails  Reviewed     Diet Order:   Diet Order            Diet Heart Room service appropriate? Yes; Fluid consistency: Thin  Diet effective now             EDUCATION NEEDS:   No education needs have been identified at this time  Skin:  Skin Assessment: Skin Integrity Issues:(MSAD to perineum, groin, scrotum, penis)  Last BM:  12/11/2018 per chart  Height:   Ht Readings from Last 1 Encounters:  12/11/18 5\' 7"  (1.702 m)   Weight:   Wt Readings from Last 1 Encounters:  12/11/18 46.3 kg   Ideal Body Weight:  67.3 kg  BMI:  Body mass index is 15.98 kg/m.  Estimated Nutritional Needs:   Kcal:  1500-1700  Protein:  75-85 grams  Fluid:  1.5-1.7 L/day  Willey Blade, MS, RD, LDN Office: [phone] Pager: [phone] After Hours/Weekend Pager: [phone]

## 2018-12-12 NOTE — TOC Initial Note (Signed)
Transition of Care [facility]) - Initial/Assessment Note    Patient Details  Name: Collin Gonzales MRN: [id number] Date of Birth: 03/27/1935  Transition of Care [facility]) CM/SW Contact:    Candie Chroman, LCSW Phone Number: 12/12/2018, 3:40 PM  Clinical Narrative: Readmission prevention screen complete. CSW met with patient. No supports at bedside. CSW introduced role and explained that discharge planning would be discussed. Patient confirmed he is active with [facility] for RN and aide. Per [facility] representative patient was recently discharged from PT and OT. He discharged to [facility] on 10/31/2018. Patient lives home alone and uses a wheelchair. Patient has a PCP and [facility] [place]. No issues affording medications. Patient still drives and his neighbor will drive him as needed. No further concerns. CSW encouraged patient to contact CSW as needed. CSW will continue to follow patient for support and facilitate return home when stable.         Expected Discharge Plan: [place]     Patient Goals and CMS Choice     Choice offered to / list presented to : NA  Expected Discharge Plan and Services Expected Discharge Plan: [place] arrangements for the past 2 months: [place] Arranged: RN, Nurse's Aide [place] Agency: [place] ([place]) Date [place]: 12/12/18   Representative spoke with at [facility]: Floydene Flock  Prior Living Arrangements/Services Living arrangements for the past 2 months: [place] Lives with:: Self Patient language and need for interpreter reviewed:: Yes Do you feel safe going back to the place where you live?: Yes      Need for Family Participation in Patient Care: Yes (Comment) Care giver support system in place?: Yes (comment) Current home services: DME, Home RN, Homehealth  aide Criminal Activity/Legal Involvement Pertinent to Current Situation/Hospitalization: No - Comment as needed  Activities of Daily Living Home Assistive Devices/Equipment: [place] Rile (specify type) ADL Screening (condition at time of admission) Patient's cognitive ability adequate to safely complete daily activities?: Yes Is the patient deaf or have difficulty hearing?: No Does the patient have difficulty seeing, even when wearing glasses/contacts?: No Does the patient have difficulty concentrating, remembering, or making decisions?: No Patient able to express need for assistance with ADLs?: Yes Does the patient have difficulty dressing or bathing?: No Independently performs ADLs?: Yes (appropriate for developmental age) Does the patient have difficulty walking or climbing stairs?: Yes Weakness of Legs: None Weakness of Arms/Hands: None  Permission Sought/Granted Permission sought to share information with : Facility Art therapist granted to share information with : Yes, Verbal Permission Granted     Permission granted to share info w AGENCY: [facility]        Emotional Assessment Appearance:: Appears stated age Attitude/Demeanor/Rapport: Engaged, Gracious Affect (typically observed): Accepting, Appropriate, Calm, Pleasant Orientation: : Oriented to Self, Oriented to Place, Oriented to  Time, Oriented to Situation Alcohol / Substance Use: Never Used Psych Involvement: No (comment)  Admission diagnosis:  Balanoposthitis [N47.6] Urinary tract infection without hematuria, site unspecified [N39.0] Patient Active Problem List   Diagnosis Date Noted  . Balanoposthitis 12/11/2018  . AKI (acute kidney injury) ([place]) 12/11/2018  . Rhabdomyolysis 10/27/2018  . Aortic atherosclerosis ([place]) 07/20/2018  . Thrombocytopenia,  unspecified ([place]) 07/20/2018  . Urothelial carcinoma ([place]) 07/20/2018  . Urothelial carcinoma of bladder ([place]) 11/12/2017  . Urinary  retention 11/01/2017  . Dependence on supplemental oxygen 04/24/2017  . Malnutrition ([place]) 04/24/2017  . Hemiparesis affecting left side as late effect of cerebrovascular accident (CVA) ([place]) 04/24/2017  . HCAP (healthcare-associated pneumonia) 03/01/2017  . UTI (urinary tract infection) 09/24/2016  . Cataract 09/06/2016  . Drug-induced folate deficiency anemia 09/06/2016  . Esophagitis, reflux 09/06/2016  . Iron deficiency anemia due to chronic blood loss 08/23/2016  . Decrease in appetite 09/01/2015  . GERD (gastroesophageal reflux disease) 08/05/2015  . COPD (chronic obstructive pulmonary disease) ([place]) 08/20/2014  . Dyslipidemia 08/20/2014  . Chronic insomnia 08/20/2014  . Arthritis or polyarthritis, rheumatoid ([place]) 08/20/2014  . History of prostate cancer 11/01/2013  . Flutter-fibrillation ([place]) 03/18/2005   PCP:  [name], MD Pharmacy:   [id number] [place] [place] - [id number]: [phone]: [phone]    Social Determinants of Health (SDOH) Interventions    Readmission Risk Interventions Readmission Risk Prevention Plan 12/12/2018 10/28/2018  Transportation Screening Complete Complete  PCP or Specialist Appt within 3-5 Days Complete -  HRI or Home Care Consult Complete -  Social Work Consult for [place]/Counseling Complete Complete  Palliative Care Screening Complete Not Applicable  Medication Review (Press photographer Complete Complete  Some recent data might be hidden

## 2018-12-12 NOTE — ED Notes (Signed)
Clean and dry for transport

## 2018-12-12 NOTE — Consult Note (Signed)
[place]  Telephone:([phone] [phone]   Name: Collin Gonzales Date: 12/12/2018 MRN: [id number]  DOB: August 31, 1935  Patient Care Team: [name], MD as PCP - General (Family Medicine) [name], MD as Referring Physician (Ophthalmology) [name], MD as Consulting Physician (Rheumatology) [name], MD as Referring Physician (Specialist) [name], MD as Consulting Physician (Urology) [name], MD as Referring Physician (Radiation Oncology)    REASON FOR CONSULTATION: Palliative Care consult requested for this 83 y.o. male with multiple medical problems including locally advanced high-grade urothelial carcinoma.  PMH also notable for O2 dependent COPD, PET positive lung mass status post SBRT, prostate cancer status post brachii therapy seed implant, history of CVA.  Patient is felt not to be a surgical candidate for his urothelial carcinoma and treatment options are felt to be somewhat limited based on frailty.    He has been receiving Tecentriq.  Patient has had worsening penile pain and was evaluated by urology on 12/06/2018 and diagnosed with balanoposthitis.  He was subsequently admitted to the hospital on 12/11/2018 with worsening groin pain.  He was admitted with possible sepsis from UTI and pneumonia.  He was referred to palliative care to help address goals and manage ongoing symptoms.   SOCIAL HISTORY:     reports that he quit smoking about 5 years ago. His smoking use included cigarettes. He has a 25.00 pack-year smoking history. He has never used smokeless tobacco. He reports that he does not drink alcohol or use drugs.   Patient is divorced.  He lives at home alone.  He has multiple children who live throughout the [place].  Patient used to work in [employer].  ADVANCE DIRECTIVES:  Does not have  CODE STATUS: DNR (Order signed on 09/11/18)  PAST MEDICAL  HISTORY: Past Medical History:  Diagnosis Date   Arthritis    Collagen vascular disease (HCC)    rheumatoid arthritis   COPD (chronic obstructive pulmonary disease) (HCC)    GERD (gastroesophageal reflux disease)    History of prostate cancer    HLD (hyperlipidemia)    HOH (hard of hearing)    Insomnia    Oxygen deficit    2L HS AND PRN   Primary cancer of right lung ([place]) 2017   rad tx's.   Prostate cancer ([place]) 2006   Rad seed tx's.   Stroke [facility])    2000   Urothelial carcinoma of bladder ([place]) 10/2017   TURP to Prostate and chemo tx's.    PAST SURGICAL HISTORY:  Past Surgical History:  Procedure Laterality Date   APPENDECTOMY  1965   CATARACT EXTRACTION W/PHACO Right 01/05/2016   Procedure: CATARACT EXTRACTION PHACO AND INTRAOCULAR LENS PLACEMENT ([place]);  Surgeon: [name], MD;  Location: [facility] ORS;  Service: Ophthalmology;  Laterality: Right;  [place] 1.14 AP% 18.1 CDE 13.44 FLUID PACK LOT # [id number] H   CYSTOSCOPY W/ RETROGRADES Bilateral 11/01/2017   Procedure: CYSTOSCOPY WITH RETROGRADE PYELOGRAM;  Surgeon: Hollice Espy, MD;  Location: [facility] ORS;  Service: Urology;  Laterality: Bilateral;   ENDOBRONCHIAL ULTRASOUND N/A 02/26/2015   Procedure: ENDOBRONCHIAL ULTRASOUND;  Surgeon: Flora Lipps, MD;  Location: [facility] ORS;  Service: Cardiopulmonary;  Laterality: N/A;   [place] RADIATION SEED  2002   PORTA CATH INSERTION N/A 05/14/2018   Procedure: PORTA CATH INSERTION;  Surgeon: Algernon Huxley, MD;  Location: [place] CV LAB;  Service: Cardiovascular;  Laterality: N/A;  TRANSURETHRAL RESECTION OF PROSTATE N/A 11/01/2017   Procedure: TRANSURETHRAL RESECTION OF THE PROSTATE (TURP) (channel TURP);  Surgeon: Hollice Espy, MD;  Location: [facility] ORS;  Service: Urology;  Laterality: N/A;    HEMATOLOGY/ONCOLOGY HISTORY:  Oncology History  Urothelial carcinoma of bladder ([place])  11/12/2017 Initial Diagnosis   Urothelial  carcinoma of bladder ([place])   11/23/2017 -  Chemotherapy   The patient had atezolizumab (TECENTRIQ) 1,200 mg in sodium chloride 0.9 % 250 mL chemo infusion, 1,200 mg, Intravenous, Once, 17 of 20 cycles Administration: 1,200 mg (11/23/2017), 1,200 mg (12/14/2017), 1,200 mg (01/04/2018), 1,200 mg (01/25/2018), 1,200 mg (03/22/2018), 1,200 mg (02/28/2018), 1,200 mg (04/12/2018), 1,200 mg (05/30/2018), 1,200 mg (06/20/2018), 1,200 mg (07/11/2018), 1,200 mg (08/01/2018), 1,200 mg (08/22/2018), 1,200 mg (09/13/2018), 1,200 mg (10/04/2018), 1,200 mg (10/25/2018), 1,200 mg (11/15/2018), 1,200 mg (12/06/2018)  for chemotherapy treatment.      ALLERGIES:  is allergic to plaquenil [hydroxychloroquine] and simvastatin.  MEDICATIONS:  Current Facility-Administered Medications  Medication Dose Route Frequency Provider Last Rate Last Dose   0.9 %  sodium chloride infusion   Intravenous Continuous Lance Coon, MD 75 mL/hr at 12/12/18 9326     acetaminophen (TYLENOL) tablet 650 mg  650 mg Oral Q6H PRN Lance Coon, MD       Or   acetaminophen (TYLENOL) suppository 650 mg  650 mg Rectal Q6H PRN Lance Coon, MD       ceFEPIme (MAXIPIME) 2 g in sodium chloride 0.9 % 100 mL IVPB  2 g Intravenous Q12H Gouru, Illene Silver, MD 200 mL/hr at 12/12/18 0927 2 g at 12/12/18 0927   enoxaparin (LOVENOX) injection 30 mg  30 mg Subcutaneous Q24H Lance Coon, MD   30 mg at 12/12/18 0619   [START ON 12/13/2018] feeding supplement (ENSURE ENLIVE) (ENSURE ENLIVE) liquid 237 mL  237 mL Oral BID BM Gouru, Aruna, MD       fluticasone furoate-vilanterol (BREO ELLIPTA) 100-25 MCG/INH 1 puff  1 puff Inhalation Daily Lance Coon, MD   1 puff at 12/12/18 0749   And   umeclidinium bromide (INCRUSE ELLIPTA) 62.5 MCG/INH 1 puff  1 puff Inhalation Daily Lance Coon, MD   1 puff at 12/12/18 0748   megestrol (MEGACE) tablet 20 mg  20 mg Oral BID Lance Coon, MD   20 mg at 12/12/18 0800   [START ON 12/13/2018] methotrexate (RHEUMATREX) tablet 10  mg  10 mg Oral Q Joaquim Nam, MD       morphine 2 MG/ML injection 2 mg  2 mg Intravenous Q4H PRN Gouru, Aruna, MD       nystatin cream (MYCOSTATIN) 1 application  1 application Topical BID Lance Coon, MD   Stopped at 12/12/18 1000   ondansetron (ZOFRAN) tablet 4 mg  4 mg Oral Q6H PRN Lance Coon, MD       Or   ondansetron [facility]) injection 4 mg  4 mg Intravenous Q6H PRN Lance Coon, MD       oxyCODONE (Oxy IR/ROXICODONE) immediate release tablet 10 mg  10 mg Oral Q4H PRN Gouru, Aruna, MD       QUEtiapine (SEROQUEL) tablet 50 mg  50 mg Oral Corwin Levins, MD       vancomycin (VANCOCIN) IVPB 750 mg/150 ml premix  750 mg Intravenous Q24H Lance Coon, MD 150 mL/hr at 12/12/18 0752 750 mg at 12/12/18 0752    VITAL SIGNS: BP 105/65 (BP Location: Right Arm)    Pulse 82    Temp 98 F (36.7 C) (Oral)  Resp (!) 21    Ht 5\' 7"  (1.702 m)    Wt 102 lb (46.3 kg)    SpO2 92%    BMI 15.98 kg/m  Filed Weights   12/11/18 1824  Weight: 102 lb (46.3 kg)    Estimated body mass index is 15.98 kg/m as calculated from the following:   Height as of this encounter: 5\' 7"  (1.702 m).   Weight as of this encounter: 102 lb (46.3 kg).  LABS: CBC:    Component Value Date/Time   WBC 8.1 12/12/2018 0555   HGB 8.6 (L) 12/12/2018 0555   HGB 12.3 (L) 11/21/2014 1010   HCT 28.6 (L) 12/12/2018 0555   HCT 39.3 11/21/2014 1010   PLT 273 12/12/2018 0555   PLT 387 (H) 11/21/2014 1010   MCV 93.2 12/12/2018 0555   MCV 95 11/21/2014 1010   MCV 95 01/05/2014 0429   NEUTROABS 7.9 (H) 12/11/2018 1840   NEUTROABS 3.2 11/21/2014 1010   NEUTROABS 5.3 01/05/2014 0429   LYMPHSABS 0.9 12/11/2018 1840   LYMPHSABS 1.7 11/21/2014 1010   LYMPHSABS 0.4 (L) 01/05/2014 0429   MONOABS 0.3 12/11/2018 1840   MONOABS 0.2 01/05/2014 0429   EOSABS 0.1 12/11/2018 1840   EOSABS 0.1 11/21/2014 1010   EOSABS 0.0 01/05/2014 0429   BASOSABS 0.0 12/11/2018 1840   BASOSABS 0.0 11/21/2014 1010   BASOSABS 0.0  01/05/2014 0429   Comprehensive Metabolic Panel:    Component Value Date/Time   NA 141 12/12/2018 0555   NA 139 12/08/2014 1004   NA 138 01/04/2014 0430   K 4.7 12/12/2018 0555   K 4.4 01/04/2014 0430   CL 109 12/12/2018 0555   CL 102 01/04/2014 0430   CO2 25 12/12/2018 0555   CO2 30 01/04/2014 0430   BUN 41 (H) 12/12/2018 0555   BUN 15 12/08/2014 1004   BUN 17 01/04/2014 0430   CREATININE 1.16 12/12/2018 0555   CREATININE 1.17 10/11/2017 1239   GLUCOSE 96 12/12/2018 0555   GLUCOSE 121 (H) 01/04/2014 0430   CALCIUM 8.8 (L) 12/12/2018 0555   CALCIUM 8.4 (L) 01/04/2014 0430   AST 33 12/11/2018 1840   ALT 13 12/11/2018 1840   ALKPHOS 49 12/11/2018 1840   BILITOT 0.4 12/11/2018 1840   BILITOT 0.3 12/08/2014 1004   PROT 8.0 12/11/2018 1840   PROT 8.6 (H) 12/08/2014 1004   ALBUMIN 2.4 (L) 12/11/2018 1840   ALBUMIN 3.7 12/08/2014 1004    RADIOGRAPHIC STUDIES: Dg Chest Port 1 View  Result Date: 12/11/2018 CLINICAL DATA:  Fever EXAM: PORTABLE CHEST 1 VIEW COMPARISON:  October 28, 2018 FINDINGS: There is underlying emphysematous change with fibrosis throughout the lower lobes. There is patchy infiltrate superimposed on fibrosis in the right base. The heart size is normal. The pulmonary vascularity reflects the underlying emphysematous change. No adenopathy. Port-A-Cath tip is in the superior vena cava. There is a skin fold on the left but no evident pneumothorax. No bone lesions. IMPRESSION: Airspace opacity consistent with pneumonia in the right base, superimposed on emphysematous change in fibrosis. Heart size within normal limits. Stable pulmonary vascularity. No adenopathy evident. Port-A-Cath tip in superior vena cava.  Skin fold on left. Followup PA and lateral chest radiographs recommended in 3-4 weeks following trial of antibiotic therapy to ensure resolution and exclude underlying malignancy. Electronically Signed   By: Lowella Grip III M.D.   On: 12/11/2018 19:55     PERFORMANCE STATUS (ECOG) : 2 - Symptomatic, <50% confined to bed  Review  of Systems Unless otherwise noted, a complete review of systems is negative.  Physical Exam General: NAD, frail appearing, thin Pulmonary: Unlabored Extremities: no edema, no joint deformities Skin: Penile wound noted but not visualized Neurological: Weakness but otherwise nonfocal  IMPRESSION: Patient known to me from the clinic.  He reports improved pain today as long as he does not move.  I encouraged utilization of as needed oxycodone.  Would have a low threshold for starting a long-acting opioid but hopefully pain will improve with treatment of his balanoposthitis. Urology consult is pending.   Patient reports worsening fatigue and weakness.  He will benefit from PT eval.  Patient says he may be interested in rehab following this hospitalization.  He is fearful that he will not be able to live alone in the future.  We did discuss option of hospice involvement.  For now, patient remains committed to continuing treatment with Tecentriq "if it will help."  We will continue to follow in the outpatient clinic and address goals and assist with management of ongoing symptoms.  PLAN: -Continue current prescription treatment -Liberalize dosing/frequency of oxycodone and consider adding long-acting opioid if necessary -DNR -We will benefit from PT consult when medically feasible -We will follow   Time Total: 60 minutes  Visit consisted of counseling and education dealing with the complex and emotionally intense issues of symptom management and palliative care in the setting of serious and potentially life-threatening illness.Greater than 50%  of this time was spent counseling and coordinating care related to the above assessment and plan.  Signed by: Altha Harm, PhD, NP-C [phone] (Work Cell)

## 2018-12-12 NOTE — Progress Notes (Signed)
Pharmacy Antibiotic Note  Collin Gonzales is a 83 y.o. male admitted on 12/11/2018 with UTI w/ h/o high-grade urothelial carcinoma (bladder cancer) w/ c/o penile pain that got worse and previous UTIs in the past.  Pharmacy has been consulted for vanc/cefepime dosing.  Plan: 10/21 @ 0500 VR 11 mcg/mL. Patient's renal function has improved to baseline therefore will start a scheduled regimen:  Vancomycin 750 mg IV Q 24 hrs. Goal AUC 400-550. Expected AUC: 513.4 SCr used: 1.16 Cssmin: 12.6  Will adjust cefepime to 2g IV q12h per CrCl 30 - 60 ml/min. Will continue to monitor and will as needed.  Height: 5\' 7"  (170.2 cm) Weight: 102 lb (46.3 kg) IBW/kg (Calculated) : 66.1  Temp (24hrs), Avg:99.5 F (37.5 C), Min:98 F (36.7 C), Max:100.8 F (38.2 C)  Recent Labs  Lab 12/06/18 1047 12/11/18 1840 12/11/18 1954 12/12/18 0555  WBC 7.6 9.3  --  8.1  CREATININE 0.99 1.35*  --  1.16  LATICACIDVEN  --   --  1.6 1.4  VANCORANDOM  --   --   --  11    Estimated Creatinine Clearance: 33.3 mL/min (by C-G formula based on SCr of 1.16 mg/dL).    Allergies  Allergen Reactions  . Plaquenil [Hydroxychloroquine] Other (See Comments)    Bad dreams  . Simvastatin Other (See Comments)    Bad dreams    Thank you for allowing pharmacy to be a part of this patient's care.  Tobie Lords, PharmD, BCPS Clinical Pharmacist 12/12/2018 6:48 AM

## 2018-12-12 NOTE — Progress Notes (Deleted)
12/13/2018 8:53 AM   Collin Gonzales October 06, 1935 [id number]  Referring provider: [name], MD [address] [place],  [place] [address]  No chief complaint on file.   HPI: Mr. Blank is an 83 year old male with locally advanced high-grade urothelial carcinoma who presents today with the complaint of penile swelling.    He states he started to experience penile swelling and pain yesterday.  Nothing helps the pain.  Nothing makes the pain better.  He denies any trauma to the area.  He denies any sexual activity.  Patient denies any gross hematuria, dysuria or suprapubic/flank pain.  Patient denies any fevers, chills, nausea or vomiting.   PMH: Past Medical History:  Diagnosis Date  . Arthritis   . Collagen vascular disease (HCC)    rheumatoid arthritis  . COPD (chronic obstructive pulmonary disease) ([place])   . GERD (gastroesophageal reflux disease)   . History of prostate cancer   . HLD (hyperlipidemia)   . HOH (hard of hearing)   . Insomnia   . Oxygen deficit    2L HS AND PRN  . Primary cancer of right lung ([place]) 2017   rad tx's.  . Prostate cancer ([place]) 2006   Rad seed tx's.  . Stroke ([place])    2000  . Urothelial carcinoma of bladder ([place]) 10/2017   TURP to Prostate and chemo tx's.    Surgical History: Past Surgical History:  Procedure Laterality Date  . APPENDECTOMY  1965  . CATARACT EXTRACTION W/PHACO Right 01/05/2016   Procedure: CATARACT EXTRACTION PHACO AND INTRAOCULAR LENS PLACEMENT ([facility]);  Surgeon: [name], MD;  Location: [facility] ORS;  Service: Ophthalmology;  Laterality: Right;  [place] 1.14 AP% 18.1 CDE 13.44 FLUID PACK LOT # [id number] H  . CYSTOSCOPY W/ RETROGRADES Bilateral 11/01/2017   Procedure: CYSTOSCOPY WITH RETROGRADE PYELOGRAM;  Surgeon: Hollice Espy, MD;  Location: [facility] ORS;  Service: Urology;  Laterality: Bilateral;  . ENDOBRONCHIAL ULTRASOUND N/A 02/26/2015   Procedure: ENDOBRONCHIAL ULTRASOUND;  Surgeon: Flora Lipps, MD;   Location: [facility] ORS;  Service: Cardiopulmonary;  Laterality: N/A;  . [place]  . INSERTION PROSTATE RADIATION SEED  2002  . PORTA CATH INSERTION N/A 05/14/2018   Procedure: PORTA CATH INSERTION;  Surgeon: Algernon Huxley, MD;  Location: [place] CV LAB;  Service: Cardiovascular;  Laterality: N/A;  . TRANSURETHRAL RESECTION OF PROSTATE N/A 11/01/2017   Procedure: TRANSURETHRAL RESECTION OF THE PROSTATE (TURP) (channel TURP);  Surgeon: Hollice Espy, MD;  Location: [facility] ORS;  Service: Urology;  Laterality: N/A;    Home Medications:  Allergies as of 12/13/2018      Reactions   Plaquenil [hydroxychloroquine] Other (See Comments)   Bad dreams   Simvastatin Other (See Comments)   Bad dreams      Medication List    Notice   This visit is during an admission. Changes to the med list made in this visit will be reflected in the After Visit Summary of the admission.     Allergies:  Allergies  Allergen Reactions  . Plaquenil [Hydroxychloroquine] Other (See Comments)    Bad dreams  . Simvastatin Other (See Comments)    Bad dreams    Family History: Family History  Problem Relation Age of Onset  . Cancer Mother   . Cancer Father        Lung Cancer  . Cancer Sister        breast  . Cancer Brother        stomach  Social History:  reports that he quit smoking about 5 years ago. His smoking use included cigarettes. He has a 25.00 pack-year smoking history. He has never used smokeless tobacco. He reports that he does not drink alcohol or use drugs.  ROS:                                        Physical Exam: There were no vitals taken for this visit.  Constitutional:  Well nourished. Alert and oriented, No acute distress. HEENT: [place] AT, moist mucus membranes.  Trachea midline, no masses. Cardiovascular: No clubbing, cyanosis, or edema. Respiratory: Normal respiratory effort, no increased work of breathing. GI: Abdomen is soft, non tender, non  distended, no abdominal masses. Liver and spleen not palpable.  No hernias appreciated.  Stool sample for occult testing is not indicated.   GU: No CVA tenderness.  No bladder fullness or masses.  Patient with uncircumcised phallus with phimosis.  Could not retract the foreskin.  Areas of cracked skin and lichenification on the tip of the foreskin and middle of the scrotum likely due to the irritation from wearing depends.  No crepitus, no fluctuation or erythema noted.  Scrotum without lesions, cysts, rashes and/or edema.  Right > Left hydrocele noted.  Testicles are located scrotally bilaterally. No masses are appreciated in the testicles. Left and right epididymis are normal. Neurologic: Grossly intact, no focal deficits, moving all 4 extremities. Psychiatric: Normal mood and affect.  Laboratory Data: Lab Results  Component Value Date   WBC 8.1 12/12/2018   HGB 8.6 (L) 12/12/2018   HCT 28.6 (L) 12/12/2018   MCV 93.2 12/12/2018   PLT 273 12/12/2018    Lab Results  Component Value Date   CREATININE 1.16 12/12/2018    No results found for: PSA  No results found for: TESTOSTERONE  Lab Results  Component Value Date   HGBA1C 5.7 (H) 10/27/2018    Lab Results  Component Value Date   TSH 1.030 12/06/2018       Component Value Date/Time   CHOL 150 10/11/2017 1239   CHOL 152 07/08/2015 0846   HDL 51 10/11/2017 1239   HDL 43 07/08/2015 0846   CHOLHDL 2.9 10/11/2017 1239   VLDL 13 05/30/2016 1128   LDLCALC 85 10/11/2017 1239    Lab Results  Component Value Date   AST 33 12/11/2018   Lab Results  Component Value Date   ALT 13 12/11/2018   No components found for: ALKALINEPHOPHATASE No components found for: BILIRUBINTOTAL  No results found for: ESTRADIOL  Urinalysis    Component Value Date/Time   COLORURINE YELLOW (A) 12/11/2018 1954   APPEARANCEUR TURBID (A) 12/11/2018 1954   APPEARANCEUR Cloudy (A) 02/02/2018 1145   LABSPEC 1.018 12/11/2018 1954   PHURINE 7.0  12/11/2018 [place] 12/11/2018 1954   HGBUR SMALL (A) 12/11/2018 [place] NEGATIVE 12/11/2018 1954   BILIRUBINUR Negative 02/02/2018 [place] 12/11/2018 1954   PROTEINUR 100 (A) 12/11/2018 1954   UROBILINOGEN 0.2 03/21/2017 1431   NITRITE NEGATIVE 12/11/2018 1954   LEUKOCYTESUR MODERATE (A) 12/11/2018 1954    I have reviewed the labs.   Assessment & Plan:    1. Phimosis Suspect that the penile swelling that he is referring to is urine getting trapped in the foreskin as no penile swelling was seen today during the exam  2. Balanoposthitis  Prescribed Nystatin cream to apply BID RTC in one week for recheck   No follow-ups on file.  These notes generated with voice recognition software. I apologize for typographical errors.  [name], PA-C  [facility] [address]  [place], [place] [address] [phone]

## 2018-12-12 NOTE — Progress Notes (Signed)
[facility] NAME: Collin Gonzales    MR#:  [id number]  DATE OF BIRTH:  Jan 24, 1936  SUBJECTIVE:  CHIEF COMPLAINT: Patient is complaining of pain and swelling the penile area, noticed some sanguinous discharge from the penis.  Patient is getting followed up by oncology and urology as an outpatient.   Collin Gonzales (dietitian) was my chaperone during my exam  REVIEW OF SYSTEMS:  CONSTITUTIONAL: No fever, fatigue or weakness.  EYES: No blurred or double vision.  EARS, NOSE, AND THROAT: No tinnitus or ear pain.  RESPIRATORY: No cough, shortness of breath, wheezing or hemoptysis.  CARDIOVASCULAR: No chest pain, orthopnea, edema.  GASTROINTESTINAL: No nausea, vomiting, diarrhea or abdominal pain.  GENITOURINARY: Reporting pain and swelling in the penile area with some discharge SKIN: No rash or lesion. MUSCULOSKELETAL: No joint pain or arthritis.   NEUROLOGIC: No tingling, numbness, weakness.  PSYCHIATRY: No anxiety or depression.   DRUG ALLERGIES:   Allergies  Allergen Reactions  . Plaquenil [Hydroxychloroquine] Other (See Comments)    Bad dreams  . Simvastatin Other (See Comments)    Bad dreams    VITALS:  Blood pressure 105/65, pulse 82, temperature 98 F (36.7 C), temperature source Oral, resp. rate (!) 21, height '5\' 7"'  (1.702 m), weight 46.3 kg, SpO2 92 %.  PHYSICAL EXAMINATION:  GENERAL:  83 y.o.-year-old patient lying in the bed with no acute distress.  EYES: Pupils equal, round, reactive to light and accommodation. No scleral icterus. Extraocular muscles intact.  HEENT: Head atraumatic, normocephalic. Oropharynx and nasopharynx clear.  NECK:  Supple, no jugular venous distention. No thyroid enlargement, no tenderness.  LUNGS: Normal breath sounds bilaterally, no wheezing, rales,rhonchi or crepitation. No use of accessory muscles of respiration.  CARDIOVASCULAR: S1, S2 normal. No murmurs, rubs, or gallops.  ABDOMEN:  Soft, nontender, nondistended. Bowel sounds present.  Genitourinary-penis is tender and edematous with some sanguinous discharge EXTREMITIES: No pedal edema, cyanosis, or clubbing.  NEUROLOGIC: Cranial nerves II through XII are intact. Muscle strength global weakness in all extremities. Sensation intact. Gait not checked.  PSYCHIATRIC: The patient is alert and oriented x 3.  SKIN: No obvious rash, lesion, or ulcer.    LABORATORY PANEL:   CBC Recent Labs  Lab 12/12/18 0555  WBC 8.1  HGB 8.6*  HCT 28.6*  PLT 273   ------------------------------------------------------------------------------------------------------------------  Chemistries  Recent Labs  Lab 12/11/18 1840 12/12/18 0555  NA 141 141  K 4.5 4.7  CL 107 109  CO2 26 25  GLUCOSE 122* 96  BUN 42* 41*  CREATININE 1.35* 1.16  CALCIUM 8.9 8.8*  AST 33  --   ALT 13  --   ALKPHOS 49  --   BILITOT 0.4  --    ------------------------------------------------------------------------------------------------------------------  Cardiac Enzymes No results for input(s): TROPONINI in the last 168 hours. ------------------------------------------------------------------------------------------------------------------  RADIOLOGY:  Dg Chest Port 1 View  Result Date: 12/11/2018 CLINICAL DATA:  Fever EXAM: PORTABLE CHEST 1 VIEW COMPARISON:  October 28, 2018 FINDINGS: There is underlying emphysematous change with fibrosis throughout the lower lobes. There is patchy infiltrate superimposed on fibrosis in the right base. The heart size is normal. The pulmonary vascularity reflects the underlying emphysematous change. No adenopathy. Port-A-Cath tip is in the superior vena cava. There is a skin fold on the left but no evident pneumothorax. No bone lesions. IMPRESSION: Airspace opacity consistent with pneumonia in the right base, superimposed on emphysematous change in fibrosis. Heart size within normal limits. Stable pulmonary  vascularity. No adenopathy evident. Port-A-Cath tip in superior vena cava.  Skin fold on left. Followup PA and lateral chest radiographs recommended in 3-4 weeks following trial of antibiotic therapy to ensure resolution and exclude underlying malignancy. Electronically Signed   By: Lowella Grip III M.D.   On: 12/11/2018 19:55    EKG:   Orders placed or performed during the hospital encounter of 10/26/18  . ED EKG  . ED EKG    ASSESSMENT AND PLAN:  #Sepsis-could be from UTI, healthcare associated pneumonia and balanoposthitis Patient met septic criteria at the time of admission with fever and hypotension Urine cultures are pending IV antibiotics for now and de-escalate depending on the culture results  # Balanoposthitis with locally advanced high-grade urothelial carcinoma Patient has been following up with outpatient urology and oncology Seen by palliative care in the past We will consult palliative care [name]  #Healthcare associated pneumonia We will give cefepime and vancomycin check MRSA PCR if it is negative will DC vancomycin  #AKI improving with IV fluids avoid nephrotoxins  #Chronic history of rheumatoid arthritis continue home medication methotrexate  #Chronic history of COPD no exacerbation at this time continue inhalers   All the records are reviewed and case discussed with Care Management/Social Workerr. Management plans discussed with the patient, daughter Collin Gonzales [phone] and they are in agreement.  CODE STATUS: DNR daughter Collin Gonzales is healthcare power of attorney  TOTAL TIME TAKING CARE OF THIS PATIENT: 35  minutes.   POSSIBLE D/C IN 2 DAYS, DEPENDING ON CLINICAL CONDITION.  Note: This dictation was prepared with Dragon dictation along with smaller phrase technology. Any transcriptional errors that result from this process are unintentional.   Nicholes Mango M.D on 12/12/2018 at 12:47 PM  Between 7am to 6pm - Pager - [phone] After  6pm go to [email or web address] - password [email or web address] [facility]  [facility]  Office  [phone]  CC: Primary care physician; [name], MD

## 2018-12-12 NOTE — Consult Note (Signed)
Urology Consult  I have been asked to see the patient by Dr. Margaretmary Eddy, for evaluation and management of balanoposthitis.  Chief Complaint: Penile pain and burning  History of Present Illness: Collin Gonzales is a 83 y.o. year old male who presented to the ED yesterday with complaints of worsening penile pain despite topical nystatin cream therapy.  He is well-known to [employer] [employer].  PMH significant for high-grade invasive urothelial carcinoma with rectoprostatic fistula.  He was previously managed with Foley catheter, however this was discontinued due to malpositioning in the setting of bladder neck contracture and fistula development.  He is believed to have a very poor prognosis and has been resistant to several efforts by his care team to enter hospice care.  He has also been managed by Dr. [name], whose most recent note from 12/06/2018 reiterates the patient is not a surgical candidate, nor is he a candidate for XRT.  He is believed to be a poor candidate for cisplatin based chemotherapy secondary to frailty.  He is currently being managed with Tecentriq and serial CT scans for monitoring of his progress.  WBC count 8.1 today.  Creatinine 1.16.  He is afebrile and normotensive today.  Today he reports continued, severe penile and scrotal pain.  He states that it is worse with manipulation of the tissue and incontinence, however there is a certain amount of baseline pain as well.  Past Medical History:  Diagnosis Date   Arthritis    Collagen vascular disease (HCC)    rheumatoid arthritis   COPD (chronic obstructive pulmonary disease) (HCC)    GERD (gastroesophageal reflux disease)    History of prostate cancer    HLD (hyperlipidemia)    HOH (hard of hearing)    Insomnia    Oxygen deficit    2L HS AND PRN   Primary cancer of right lung ([place]) 2017   rad tx's.   Prostate cancer ([place]) 2006   Rad seed tx's.   Stroke [facility])    2000   Urothelial  carcinoma of bladder ([place]) 10/2017   TURP to Prostate and chemo tx's.    Past Surgical History:  Procedure Laterality Date   APPENDECTOMY  1965   CATARACT EXTRACTION W/PHACO Right 01/05/2016   Procedure: CATARACT EXTRACTION PHACO AND INTRAOCULAR LENS PLACEMENT ([place]);  Surgeon: [name], MD;  Location: [facility] ORS;  Service: Ophthalmology;  Laterality: Right;  [place] 1.14 AP% 18.1 CDE 13.44 FLUID PACK LOT # [id number] H   CYSTOSCOPY W/ RETROGRADES Bilateral 11/01/2017   Procedure: CYSTOSCOPY WITH RETROGRADE PYELOGRAM;  Surgeon: Hollice Espy, MD;  Location: [facility] ORS;  Service: Urology;  Laterality: Bilateral;   ENDOBRONCHIAL ULTRASOUND N/A 02/26/2015   Procedure: ENDOBRONCHIAL ULTRASOUND;  Surgeon: Flora Lipps, MD;  Location: [facility] ORS;  Service: Cardiopulmonary;  Laterality: N/A;   [place] RADIATION SEED  2002   PORTA CATH INSERTION N/A 05/14/2018   Procedure: PORTA CATH INSERTION;  Surgeon: Algernon Huxley, MD;  Location: [facility];  Service: Cardiovascular;  Laterality: N/A;   TRANSURETHRAL RESECTION OF PROSTATE N/A 11/01/2017   Procedure: TRANSURETHRAL RESECTION OF THE PROSTATE (TURP) (channel TURP);  Surgeon: Hollice Espy, MD;  Location: [facility] ORS;  Service: Urology;  Laterality: N/A;    Home Medications:  Current Meds  Medication Sig   Fluticasone-Umeclidin-Vilant (TRELEGY ELLIPTA) 100-62.5-25 MCG/INH AEPB Inhale 1 puff into the lungs daily.   megestrol (MEGACE) 20 MG tablet Take 1 tablet (20 mg total) by mouth  2 (two) times daily.   methotrexate (RHEUMATREX) 2.5 MG tablet Take 4 tablets by mouth every Thursday.    nystatin cream (MYCOSTATIN) Apply 1 application topically 2 (two) times daily.   oxyCODONE (OXY IR/ROXICODONE) 5 MG immediate release tablet Take 2 tablets (10 mg total) by mouth every 6 (six) hours as needed for severe pain.   QUEtiapine (SEROQUEL) 50 MG tablet Take 1 tablet (50 mg total) by mouth at bedtime.     Allergies:  Allergies  Allergen Reactions   Plaquenil [Hydroxychloroquine] Other (See Comments)    Bad dreams   Simvastatin Other (See Comments)    Bad dreams    Family History  Problem Relation Age of Onset   Cancer Mother    Cancer Father        Lung Cancer   Cancer Sister        breast   Cancer Brother        stomach    Social History:  reports that he quit smoking about 5 years ago. His smoking use included cigarettes. He has a 25.00 pack-year smoking history. He has never used smokeless tobacco. He reports that he does not drink alcohol or use drugs.  ROS: A complete review of systems was performed.  All systems are negative except for pertinent findings as noted.  Physical Exam:  Vital signs in last 24 hours: Temp:  [98 F (36.7 C)-100.8 F (38.2 C)] 98 F (36.7 C) (10/21 0542) Pulse Rate:  [75-96] 82 (10/21 0542) Resp:  [16-22] 21 (10/21 0542) BP: (98-112)/(59-73) 105/65 (10/21 0542) SpO2:  [88 %-100 %] 92 % (10/21 0542) Weight:  [46.3 kg] 46.3 kg (10/20 1824) Constitutional:  Alert and oriented, cachectic HEENT: [place] AT, moist mucus membranes Cardiovascular: No clubbing, cyanosis, or edema. Respiratory: Normal respiratory effort GI: Abdomen is soft, nontender, nondistended GU: Uncircumcised phallus.  Phimosis noted.  Patient incontinent of urine mixed with stool.  Ulceration of the foreskin, penile tip, and midline and posterior scrotum Skin: Pressure injuries of the right hip and sacrum, bandaged Neurologic: Grossly intact, moving all 4 extremities Psychiatric: Normal mood and affect  Laboratory Data:  Recent Labs    12/11/18 1840 12/12/18 0555  WBC 9.3 8.1  HGB 8.4* 8.6*  HCT 27.7* 28.6*   Recent Labs    12/11/18 1840 12/12/18 0555  NA 141 141  K 4.5 4.7  CL 107 109  CO2 26 25  GLUCOSE 122* 96  BUN 42* 41*  CREATININE 1.35* 1.16  CALCIUM 8.9 8.8*   Results for orders placed or performed during the hospital encounter of 12/11/18   Blood Culture (routine x 2)     Status: None (Preliminary result)   Collection Time: 12/11/18  6:40 PM   Specimen: BLOOD  Result Value Ref Range Status   Specimen Description BLOOD BLOOD LEFT FOREARM  Final   Special Requests   Final    BOTTLES DRAWN AEROBIC AND ANAEROBIC Blood Culture adequate volume   Culture   Final    NO GROWTH < 12 HOURS Performed at [facility], [address] [place], [place] [address]    Report Status PENDING  Incomplete  Blood Culture (routine x 2)     Status: None (Preliminary result)   Collection Time: 12/11/18  6:40 PM   Specimen: BLOOD  Result Value Ref Range Status   Specimen Description BLOOD LEFT ANTECUBITAL  Final   Special Requests   Final    BOTTLES DRAWN AEROBIC AND ANAEROBIC Blood Culture adequate volume  Culture   Final    NO GROWTH < 12 HOURS Performed at [facility] [place], [place], [place] [address]    Report Status PENDING  Incomplete  SARS CORONAVIRUS 2 (TAT 6-24 HRS) Nasopharyngeal Nasopharyngeal Swab     Status: None   Collection Time: 12/11/18  9:27 PM   Specimen: Nasopharyngeal Swab  Result Value Ref Range Status   SARS Coronavirus 2 NEGATIVE NEGATIVE Final    Comment: (NOTE) SARS-CoV-2 target nucleic acids are NOT DETECTED. The SARS-CoV-2 RNA is generally detectable in upper and lower respiratory specimens during the acute phase of infection. Negative results do not preclude SARS-CoV-2 infection, do not rule out co-infections with other pathogens, and should not be used as the sole basis for treatment or other patient management decisions. Negative results must be combined with clinical observations, patient history, and epidemiological information. The expected result is Negative. Fact Sheet for Patients: SugarRoll.be Fact Sheet for Healthcare Providers: https://www.woods-mathews.com/ This test is not yet approved or cleared by the [place] FDA  and  has been authorized for detection and/or diagnosis of SARS-CoV-2 by FDA under an Emergency Use Authorization (EUA). This EUA will remain  in effect (meaning this test can be used) for the duration of the COVID-19 declaration under Section 56 4(b)(1) of the Act, 21 U.S.C. section 360bbb-3(b)(1), unless the authorization is terminated or revoked sooner. Performed at [facility], [place] [address], [place], [place] [address]    Assessment & Plan:  Patient with worsening pain and ulcerations of the distal penis, foreskin, and scrotum in the setting of advanced urothelial carcinoma with rectoprostatic fistula and poor prognosis.  Patient was prescribed topical nystatin cream for this 1 week ago, however he reports minimum symptom improvement in the interim.  It is unclear at this time if these ulcerations represent skin breakdown in the setting of his incontinence versus possible metastasis of his urothelial cancer.  Regardless, he is not a surgical candidate and I recommend his care be limited to palliative methods at this time.  I strongly encourage a palliative care consult for further discussion of this with the patient and his family members during this hospitalization.  Agree with wound and ostomy nursing consult.  Would recommend the continued use of barrier ointments to promote healing of his penile and scrotal ulcerations.  Given his known rectal prostatic fistula and the absence of leukocytosis or other irritative voiding symptoms, I am not concerned for urinary tract infection at this time. I would recommend holding antibiotics for urinary infections pending the development of systemic signs of illness, e.g. fever, leukocytosis.  Thank you for involving me in this patient's care, please page with any further questions or concerns.  Debroah Loop, PA-C 12/12/2018 3:39 PM

## 2018-12-12 NOTE — Consult Note (Addendum)
[facility] Nurse wound consult note Reason for Consult: Patient with erythematous and edematous perineal area.  With history of high-grade urothelial carcinoma, followed by oncology. Oncology referred to urology when symptoms presented last week on 10/15 and patient was seen by PA-C [name] who prescribed nystatin cream for balanoposthitis. Also noted on her examination was a penile shaft dermatitis from the body-worn absorptive product in use for incontinence, Depends.   Wound type: fungal, infectious Pressure Injury POA: N/A  This condition exceeds the scope of [facility] nursing practice.  Recommend consulting Urology to see in house and collaborating with ID if needed to determine the most appropriate systemic antibiotic approach. Suggest that MD consider continuing topical nystatin per Urology PA-C's note dated 12/06/18.  [facility] nursing team will not follow, but will remain available to this patient, the nursing and medical teams.  Please re-consult if needed. Thanks, Maudie Flakes, MSN, RN, [place], [facility]  Pager# [phone]

## 2018-12-12 NOTE — Plan of Care (Signed)

## 2018-12-12 NOTE — ED Notes (Signed)
ED TO INPATIENT HANDOFF REPORT  ED Nurse Name and Phone #:  Anson Crofts Name/Age/Gender Collin Gonzales 83 y.o. male Room/Bed: ED03A/ED03A  Code Status   Code Status: Prior  Home/SNF/Other Home Patient oriented to:  Is this baseline? Yes   Triage Complete: Triage complete  Chief Complaint Groin Pain  Triage Note PT to ED via EMS from home. PT c/o penile pain for a few days that got worse today. HX of bladder cancer and wound to penile region. Has had UTI in past. PT alert and at baseline at this time.    Allergies Allergies  Allergen Reactions  . Plaquenil [Hydroxychloroquine] Other (See Comments)    Bad dreams  . Simvastatin Other (See Comments)    Bad dreams    Level of Care/Admitting Diagnosis ED Disposition    ED Disposition Condition [facility] Area: [place] [[id number]]  Level of Care: Med-Surg [16]  Covid Evaluation: Asymptomatic Screening Protocol (No Symptoms)  Diagnosis: Balanoposthitis [160.1.ICD-9-CM]  Admitting Physician: Lance Coon [[id number]]  Attending Physician: Lance Coon [phone]  Estimated length of stay: past midnight tomorrow  Certification:: I certify this patient will need inpatient services for at least 2 midnights  PT Class (Do Not Modify): Inpatient [101]  PT Acc Code (Do Not Modify): Private [1]       B Medical/Surgery History Past Medical History:  Diagnosis Date  . Arthritis   . Collagen vascular disease (HCC)    rheumatoid arthritis  . COPD (chronic obstructive pulmonary disease) ([place])   . GERD (gastroesophageal reflux disease)   . History of prostate cancer   . HLD (hyperlipidemia)   . HOH (hard of hearing)   . Insomnia   . Oxygen deficit    2L HS AND PRN  . Primary cancer of right lung ([place]) 2017   rad tx's.  . Prostate cancer ([place]) 2006   Rad seed tx's.  . Stroke ([place])    2000  . Urothelial carcinoma of bladder ([place]) 10/2017   TURP to Prostate and chemo tx's.   Past  Surgical History:  Procedure Laterality Date  . APPENDECTOMY  1965  . CATARACT EXTRACTION W/PHACO Right 01/05/2016   Procedure: CATARACT EXTRACTION PHACO AND INTRAOCULAR LENS PLACEMENT (IOC);  Surgeon: [name], MD;  Location: [facility] ORS;  Service: Ophthalmology;  Laterality: Right;  [place] 1.14 AP% 18.1 CDE 13.44 FLUID PACK LOT # [id number] H  . CYSTOSCOPY W/ RETROGRADES Bilateral 11/01/2017   Procedure: CYSTOSCOPY WITH RETROGRADE PYELOGRAM;  Surgeon: Hollice Espy, MD;  Location: [facility] ORS;  Service: Urology;  Laterality: Bilateral;  . ENDOBRONCHIAL ULTRASOUND N/A 02/26/2015   Procedure: ENDOBRONCHIAL ULTRASOUND;  Surgeon: Flora Lipps, MD;  Location: [facility] ORS;  Service: Cardiopulmonary;  Laterality: N/A;  . [place]  . INSERTION PROSTATE RADIATION SEED  2002  . PORTA CATH INSERTION N/A 05/14/2018   Procedure: PORTA CATH INSERTION;  Surgeon: Algernon Huxley, MD;  Location: [facility];  Service: Cardiovascular;  Laterality: N/A;  . TRANSURETHRAL RESECTION OF PROSTATE N/A 11/01/2017   Procedure: TRANSURETHRAL RESECTION OF THE PROSTATE (TURP) (channel TURP);  Surgeon: Hollice Espy, MD;  Location: [facility] OR[facility];  Service: Urology;  Laterality: N/A;     A IV Location/Drains/Wounds Patient Lines/Drains/Airways Status   Active Line/Drains/Airways    Name:   Placement date:   Placement time:   Site:   Days:   Implanted Port 10/28/18 Right Chest   10/28/18    0300    Chest  45   Peripheral IV 12/11/18 Left Antecubital   12/11/18    1949    Antecubital   1   Peripheral IV 12/11/18 Left Antecubital   12/11/18    1800    Antecubital   1          Intake/Output Last 24 hours  Intake/Output Summary (Last 24 hours) at 12/12/2018 0450 Last data filed at 12/11/2018 2128 Gross per 24 hour  Intake 500 ml  Output -  Net 500 ml    Labs/Imaging Results for orders placed or performed during the hospital encounter of 12/11/18 (from the past 48 hour(s))  Comprehensive metabolic panel      Status: Abnormal   Collection Time: 12/11/18  6:40 PM  Result Value Ref Range   Sodium 141 135 - 145 mmol/L   Potassium 4.5 3.5 - 5.1 mmol/L   Chloride 107 98 - 111 mmol/L   CO2 26 22 - 32 mmol/L   Glucose, Bld 122 (H) 70 - 99 mg/dL   BUN 42 (H) 8 - 23 mg/dL   Creatinine, Ser 1.35 (H) 0.61 - 1.24 mg/dL   Calcium 8.9 8.9 - 10.3 mg/dL   Total Protein 8.0 6.5 - 8.1 g/dL   Albumin 2.4 (L) 3.5 - 5.0 g/dL   AST 33 15 - 41 U/L   ALT 13 0 - 44 U/L   Alkaline Phosphatase 49 38 - 126 U/L   Total Bilirubin 0.4 0.3 - 1.2 mg/dL   GFR calc non Af Amer 49 (L) >60 mL/min   GFR calc Af Amer 57 (L) >60 mL/min   Anion gap 8 5 - 15    Comment: Performed at [facility], [address] [place], [place] [address]  CBC WITH DIFFERENTIAL     Status: Abnormal   Collection Time: 12/11/18  6:40 PM  Result Value Ref Range   WBC 9.3 4.0 - 10.5 K/uL   RBC 3.02 (L) 4.22 - 5.81 MIL/uL   Hemoglobin 8.4 (L) 13.0 - 17.0 g/dL   HCT 27.7 (L) 39.0 - 52.0 %   MCV 91.7 80.0 - 100.0 fL   MCH 27.8 26.0 - 34.0 pg   MCHC 30.3 30.0 - 36.0 g/dL   RDW 17.0 (H) 11.5 - 15.5 %   Platelets 272 150 - 400 K/uL   nRBC 0.0 0.0 - 0.2 %   Neutrophils Relative % 86 %   Neutro Abs 7.9 (H) 1.7 - 7.7 K/uL   Lymphocytes Relative 9 %   Lymphs Abs 0.9 0.7 - 4.0 K/uL   Monocytes Relative 3 %   Monocytes Absolute 0.3 0.1 - 1.0 K/uL   Eosinophils Relative 1 %   Eosinophils Absolute 0.1 0.0 - 0.5 K/uL   Basophils Relative 0 %   Basophils Absolute 0.0 0.0 - 0.1 K/uL   RBC Morphology MORPHOLOGY UNREMARKABLE    Smear Review Normal platelet morphology    Immature Granulocytes 1 %   Abs Immature Granulocytes 0.08 (H) 0.00 - 0.07 K/uL    Comment: Performed at [facility], [address] [place], [place] [address]  Lactic acid, plasma     Status: None   Collection Time: 12/11/18  7:54 PM  Result Value Ref Range   Lactic Acid, Venous 1.6 0.5 - 1.9 mmol/L    Comment: Performed at [facility], [address] [place], [place] [address]  Urinalysis, Complete w Microscopic     Status: Abnormal   Collection Time: 12/11/18  7:54 PM  Result  Value Ref Range   Color, Urine YELLOW (A) YELLOW   APPearance TURBID (A) CLEAR   Specific Gravity, Urine 1.018 1.005 - 1.030   pH 7.0 5.0 - 8.0   Glucose, UA NEGATIVE NEGATIVE mg/dL   Hgb urine dipstick SMALL (A) NEGATIVE   Bilirubin Urine NEGATIVE NEGATIVE   Ketones, ur NEGATIVE NEGATIVE mg/dL   Protein, ur 100 (A) NEGATIVE mg/dL   Nitrite NEGATIVE NEGATIVE   Leukocytes,Ua MODERATE (A) NEGATIVE   RBC / HPF 6-10 0 - 5 RBC/hpf   WBC, UA 11-20 0 - 5 WBC/hpf   Bacteria, UA MANY (A) NONE SEEN   Squamous Epithelial / LPF NONE SEEN 0 - 5   WBC Clumps PRESENT     Comment: Performed at [facility], [address], [place], [place] [address]   Dg Chest Port 1 View  Result Date: 12/11/2018 CLINICAL DATA:  Fever EXAM: PORTABLE CHEST 1 VIEW COMPARISON:  October 28, 2018 FINDINGS: There is underlying emphysematous change with fibrosis throughout the lower lobes. There is patchy infiltrate superimposed on fibrosis in the right base. The heart size is normal. The pulmonary vascularity reflects the underlying emphysematous change. No adenopathy. Port-A-Cath tip is in the superior vena cava. There is a skin fold on the left but no evident pneumothorax. No bone lesions. IMPRESSION: Airspace opacity consistent with pneumonia in the right base, superimposed on emphysematous change in fibrosis. Heart size within normal limits. Stable pulmonary vascularity. No adenopathy evident. Port-A-Cath tip in superior vena cava.  Skin fold on left. Followup PA and lateral chest radiographs recommended in 3-4 weeks following trial of antibiotic therapy to ensure resolution and exclude underlying malignancy. Electronically Signed   By: Lowella Grip III M.D.   On: 12/11/2018 19:55    Pending Labs Unresulted Labs (From admission, onward)    Start     Ordered   12/12/18 0500   Vancomycin, random  Tomorrow morning,   STAT     12/11/18 2326   12/12/18 9485  Basic metabolic panel  Tomorrow morning,   STAT     12/12/18 0022   12/11/18 2115  SARS CORONAVIRUS 2 (TAT 6-24 HRS) Nasopharyngeal Nasopharyngeal Swab  (Asymptomatic/Tier 2 Patients Labs)  Once,   STAT    Question Answer Comment  Is this test for diagnosis or screening Screening   Symptomatic for COVID-19 as defined by CDC No   Hospitalized for COVID-19 No   Admitted to ICU for COVID-19 No   Previously tested for COVID-19 No   Resident in a congregate (group) care setting No   Employed in healthcare setting No      12/11/18 2114   12/11/18 1929  Lactic acid, plasma  Now then every 2 hours,   STAT     12/11/18 1930   12/11/18 1929  Blood Culture (routine x 2)  BLOOD CULTURE X 2,   STAT     12/11/18 1930   12/11/18 1929  Urine culture  ONCE - STAT,   STAT     12/11/18 1930   Signed and Held  CBC  (enoxaparin (LOVENOX)    CrCl >/= 30 ml/min)  Once,   R    Comments: Baseline for enoxaparin therapy IF NOT ALREADY DRAWN.  Notify MD if PLT < 100 K.    Signed and Held   Signed and Held  Creatinine, serum  (enoxaparin (LOVENOX)    CrCl >/= 30 ml/min)  Once,   R    Comments: Baseline for enoxaparin therapy IF  NOT ALREADY DRAWN.    Signed and Held   Signed and Held  Creatinine, serum  (enoxaparin (LOVENOX)    CrCl >/= 30 ml/min)  Weekly,   R    Comments: while on enoxaparin therapy    Signed and Held   Signed and Held  Basic metabolic panel  Tomorrow morning,   R     Signed and Held   Signed and Held  CBC  Tomorrow morning,   R     Signed and Held          Vitals/Pain Today's Vitals   12/12/18 0315 12/12/18 0400 12/12/18 0415 12/12/18 0445  BP:  104/65    Pulse: 78  76 82  Resp:      Temp:      TempSrc:      SpO2: 100% 91% 100% 95%  Weight:      Height:      PainSc:        Isolation Precautions No active isolations  Medications Medications  ceFEPIme (MAXIPIME) 2 g in sodium chloride 0.9  % 100 mL IVPB (has no administration in time range)  fentaNYL (SUBLIMAZE) injection 50 mcg (50 mcg Intravenous Given 12/11/18 1954)  sodium chloride 0.9 % bolus 500 mL (0 mLs Intravenous Stopped 12/11/18 2128)  acetaminophen (TYLENOL) tablet 1,000 mg (1,000 mg Oral Given 12/11/18 2009)  lidocaine (XYLOCAINE) 2 % jelly 1 application (1 application Urethral Given 12/11/18 1955)  vancomycin (VANCOCIN) IVPB 1000 mg/200 mL premix (0 mg Intravenous Stopped 12/11/18 2243)  ceFEPIme (MAXIPIME) 2 g in sodium chloride 0.9 % 100 mL IVPB (0 g Intravenous Stopped 12/11/18 2154)    Mobility With device, moderate risk   Focused Assessments Renal Assessment Handoff:           R Recommendations: See Admitting Provider Note  Report given to:   Additional Notes:   Lives alone and this doesn't appear to be safe

## 2018-12-13 ENCOUNTER — Ambulatory Visit: Payer: Medicare Other | Admitting: Urology

## 2018-12-13 DIAGNOSIS — Z515 Encounter for palliative care: Secondary | ICD-10-CM | POA: Diagnosis not present

## 2018-12-13 DIAGNOSIS — G893 Neoplasm related pain (acute) (chronic): Secondary | ICD-10-CM | POA: Diagnosis not present

## 2018-12-13 DIAGNOSIS — C679 Malignant neoplasm of bladder, unspecified: Secondary | ICD-10-CM | POA: Diagnosis not present

## 2018-12-13 DIAGNOSIS — E43 Unspecified severe protein-calorie malnutrition: Secondary | ICD-10-CM | POA: Insufficient documentation

## 2018-12-13 DIAGNOSIS — N476 Balanoposthitis: Secondary | ICD-10-CM | POA: Diagnosis not present

## 2018-12-13 DIAGNOSIS — Z87891 Personal history of nicotine dependence: Secondary | ICD-10-CM | POA: Diagnosis not present

## 2018-12-13 MED ORDER — SODIUM CHLORIDE 0.9% FLUSH
10.0000 mL | INTRAVENOUS | Status: DC | PRN
  Administered 2018-12-17 – 2018-12-19 (×3): 10 mL
  Filled 2018-12-13 (×3): qty 40

## 2018-12-13 MED ORDER — METHOTREXATE 2.5 MG PO TABS
10.0000 mg | ORAL_TABLET | ORAL | Status: DC
  Administered 2018-12-13: 10 mg via ORAL
  Filled 2018-12-13 (×2): qty 4

## 2018-12-13 MED ORDER — CHLORHEXIDINE GLUCONATE CLOTH 2 % EX PADS
6.0000 | MEDICATED_PAD | Freq: Every day | CUTANEOUS | Status: DC
  Administered 2018-12-14 – 2018-12-20 (×8): 6 via TOPICAL

## 2018-12-13 NOTE — TOC Progression Note (Signed)
Transition of Care [facility]) - Progression Note    Patient Details  Name: Collin Gonzales MRN: [id number] Date of Birth: 04/21/1935  Transition of [facility]) CM/SW [place], LCSW Phone Number: 12/13/2018, 1:44 PM  Clinical Narrative: PT evaluated patient this morning and recommended SNF. Patient was at [facility] 9/9-9/29 so he is in his copay days which would cost around $160 per day. Patient confirmed he is on a fixed income and cannot afford this. He is agreeable to adding PT, OT, SW to [facility] services if they are able to do so. He has a daughter that lives 5 mins away and a neighbor that assists as needed. His daughter cannot provide much assistance due to having a history of seizures.     Expected Discharge Plan: [place]    Expected Discharge Plan and Services Expected Discharge Plan: [place] Choice: [place] arrangements for the past 2 months: Single Family Home                           HH Arranged: RN, Nurse's Aide [facility] Agency: [place] ([place]) Date [place]: 12/12/18   Representative spoke with at [place]: Floydene Flock   Social Determinants of Health (SDOH) Interventions    Readmission Risk Interventions Readmission Risk Prevention Plan 12/12/2018 10/28/2018  Transportation Screening Complete Complete  PCP or Specialist Appt within 3-5 Days Complete -  HRI or Home Care Consult Complete -  Social Work Consult for [place] Planning/Counseling Complete Complete  Palliative Care Screening Complete Not Applicable  Medication Review Press photographer) Complete Complete  Some recent data might be hidden

## 2018-12-13 NOTE — Evaluation (Signed)
Physical Therapy Treatment Patient Details Name: Collin Gonzales MRN: [id number] DOB: [birth year] Today's Date: 12/13/2018    History of Present Illness 83 y.o. male who presents with chief complaint of worsening penile pain.  He states that he was recently seen by urology and was diagnosed with balanitis, and given nystatin cream.  He says that he has been applying this cream, but that he has not had improvement in his symptoms.      PT Comments    Pt was willing to try getting up and walking, but simply put had far too much pain with even very cautious, modest attempts at bed mobility.  Pt very limited secondary to penile pain and never did attain fully upright sitting at EOB even with heavy assistance and extra cuing.    Follow Up Recommendations  SNF;Supervision/Assistance - 24 hour     Equipment Recommendations  (TBD at rehab)    Recommendations for Other Services       Precautions / Restrictions Precautions Precautions: Fall Restrictions Weight Bearing Restrictions: No    Mobility  Bed Mobility Overal bed mobility: Needs Assistance Bed Mobility: Supine to Sit     Supine to sit: Max assist     General bed mobility comments: Attempted in multiple stages over extra time to get to sitting.  Pt c/o severe pain in penis with every attempt and ultimately he was unable to tolerate even getting to sitting position.  Overall he was unable to tolerate much of anything  Transfers                    Ambulation/Gait                 Stairs             Wheelchair Mobility    Modified Rankin (Stroke Patients Only)       Balance Overall balance assessment: (unable to truly assess as he could not attain sitting (pain))                                          Cognition Arousal/Alertness: Awake/alert Behavior During Therapy: WFL for tasks assessed/performed Overall Cognitive Status: Within Functional Limits for tasks assessed                                         Exercises      General Comments        Pertinent Vitals/Pain Pain Assessment: 0-10 Pain Score: 9  Pain Location: penis    Home Living Family/patient expects to be discharged to:: Unsure Living Arrangements: Alone             Additional Comments: Pt reports that he does have some assist but that family is unavailabe as much as he would currently need    Prior Function Level of Independence: Needs assistance  Gait / Transfers Assistance Needed: Pt states that he uses walker in the home, has not been out of the house much recently       PT Goals (current goals can now be found in the care plan section) Acute Rehab PT Goals Patient Stated Goal: wants to go to rehab PT Goal Formulation: With patient Time For Goal Achievement: 12/27/18 Potential to Achieve Goals: Fair    Frequency  Min 2X/week      PT Plan      Co-evaluation              AM-PAC PT "6 Clicks" Mobility   Outcome Measure  Help needed turning from your back to your side while in a flat bed without using bedrails?: Total Help needed moving from lying on your back to sitting on the side of a flat bed without using bedrails?: Total Help needed moving to and from a bed to a chair (including a wheelchair)?: Total Help needed standing up from a chair using your arms (e.g., wheelchair or bedside chair)?: Total Help needed to walk in hospital room?: Total Help needed climbing 3-5 steps with a railing? : Total 6 Click Score: 6    End of Session   Activity Tolerance: Patient limited by pain Patient left: with call bell/phone within reach;with bed alarm set Nurse Communication: Mobility status PT Visit Diagnosis: Difficulty in walking, not elsewhere classified (R26.2);Muscle weakness (generalized) (M62.81)     Time: 1610-9604 PT Time Calculation (min) (ACUTE ONLY): 19 min  Charges:                        Kreg Shropshire,  DPT 12/13/2018, 1:24 PM

## 2018-12-13 NOTE — Progress Notes (Signed)
[facility] [place] NAME: Collin Gonzales    MR#:  [id number]  DATE OF BIRTH:  1935/04/06  SUBJECTIVE:  Seen and evaluated today Generalized weakness No fever and chills Tolerating diet okay  REVIEW OF SYSTEMS:  CONSTITUTIONAL: No fever, has fatigue and weakness.  EYES: No blurred or double vision.  EARS, NOSE, AND THROAT: No tinnitus or ear pain.  RESPIRATORY: No cough, shortness of breath, wheezing or hemoptysis.  CARDIOVASCULAR: No chest pain, orthopnea, edema.  GASTROINTESTINAL: No nausea, vomiting, diarrhea or abdominal pain.  GENITOURINARY: Reporting pain and swelling in the penile area with some discharge SKIN: No rash or lesion. MUSCULOSKELETAL: No joint pain or arthritis.   NEUROLOGIC: No tingling, numbness, weakness.  PSYCHIATRY: No anxiety or depression.   DRUG ALLERGIES:   Allergies  Allergen Reactions  . Plaquenil [Hydroxychloroquine] Other (See Comments)    Bad dreams  . Simvastatin Other (See Comments)    Bad dreams    VITALS:  Blood pressure (!) 93/55, pulse 77, temperature 98.2 F (36.8 C), temperature source Oral, resp. rate 18, height '5\' 7"'  (1.702 m), weight 46.3 kg, SpO2 96 %.  PHYSICAL EXAMINATION:  GENERAL:  83 y.o.-year-old patient lying in the bed with no acute distress.  EYES: Pupils equal, round, reactive to light and accommodation. No scleral icterus. Extraocular muscles intact.  HEENT: Head atraumatic, normocephalic. Oropharynx and nasopharynx clear.  NECK:  Supple, no jugular venous distention. No thyroid enlargement, no tenderness.  LUNGS: Normal breath sounds bilaterally, no wheezing, rales,rhonchi or crepitation. No use of accessory muscles of respiration.  CARDIOVASCULAR: S1, S2 normal. No murmurs, rubs, or gallops.  ABDOMEN: Soft, nontender, nondistended. Bowel sounds present.  Genitourinary-penis is tender and edematous with some sanguinous discharge EXTREMITIES: No pedal edema,  cyanosis, or clubbing.  NEUROLOGIC: Cranial nerves II through XII are intact. Muscle strength global weakness in all extremities. Sensation intact. Gait not checked.  PSYCHIATRIC: The patient is alert and oriented x 3.  SKIN: Pressure injuries of the right hip and sacrum bandaged Genitourinary phimosis noted Uncircumcised phallus Ulceration of the foreskin, penile tip and midline scrotum   LABORATORY PANEL:   CBC Recent Labs  Lab 12/12/18 0555  WBC 8.1  HGB 8.6*  HCT 28.6*  PLT 273   ------------------------------------------------------------------------------------------------------------------  Chemistries  Recent Labs  Lab 12/11/18 1840 12/12/18 0555  NA 141 141  K 4.5 4.7  CL 107 109  CO2 26 25  GLUCOSE 122* 96  BUN 42* 41*  CREATININE 1.35* 1.16  CALCIUM 8.9 8.8*  AST 33  --   ALT 13  --   ALKPHOS 49  --   BILITOT 0.4  --    ------------------------------------------------------------------------------------------------------------------  Cardiac Enzymes No results for input(s): TROPONINI in the last 168 hours. ------------------------------------------------------------------------------------------------------------------  RADIOLOGY:  Dg Chest Port 1 View  Result Date: 12/11/2018 CLINICAL DATA:  Fever EXAM: PORTABLE CHEST 1 VIEW COMPARISON:  October 28, 2018 FINDINGS: There is underlying emphysematous change with fibrosis throughout the lower lobes. There is patchy infiltrate superimposed on fibrosis in the right base. The heart size is normal. The pulmonary vascularity reflects the underlying emphysematous change. No adenopathy. Port-A-Cath tip is in the superior vena cava. There is a skin fold on the left but no evident pneumothorax. No bone lesions. IMPRESSION: Airspace opacity consistent with pneumonia in the right base, superimposed on emphysematous change in fibrosis. Heart size within normal limits. Stable pulmonary vascularity. No adenopathy evident.  Port-A-Cath tip in superior vena cava.  Skin fold on left. Followup PA and lateral chest radiographs recommended in 3-4 weeks following trial of antibiotic therapy to ensure resolution and exclude underlying malignancy. Electronically Signed   By: Lowella Grip III M.D.   On: 12/11/2018 19:55    EKG:   Orders placed or performed during the hospital encounter of 10/26/18  . ED EKG  . ED EKG    ASSESSMENT AND PLAN:  83 year old male patient with history of collagen vascular disease, rheumatoid arthritis, COPD, GERD, prostate cancer, hyperlipidemia, right lung cancer, stroke, urothelial carcinoma bladder currently under hospitalist service  #Sepsis-could be from UTI, healthcare associated pneumonia and balanoposthitis Patient met septic criteria at the time of admission with fever and hypotension Urine cultures shows multiple species IV antibiotics for now and de-escalate depending on the culture results  # Balanoposthitis with locally advanced high-grade urothelial carcinoma Patient has been following up with  urology and oncology Seen by palliative care in the past Not a surgical candidate Palliative care team on board  #Healthcare associated pneumonia Vancomycin antibiotic discontinued Continue cefepime  #AKI improving with IV fluids avoid nephrotoxins  #Chronic history of rheumatoid arthritis continue home medication methotrexate  #Chronic history of COPD no exacerbation at this time continue inhalers  #Ambulatory dysfunction Physical therapy evaluation appreciated SNF placement versus home with home health services   All the records are reviewed and case discussed with Care Management/Social Workerr. Management plans discussed with the patient, daughter [name] [phone] and they are in agreement.  CODE STATUS: DNR daughter [name] is healthcare power of attorney  TOTAL TIME TAKING CARE OF THIS PATIENT: 38  minutes.   POSSIBLE D/C IN 2 DAYS, DEPENDING ON  CLINICAL CONDITION.  Note: This dictation was prepared with Dragon dictation along with smaller phrase technology. Any transcriptional errors that result from this process are unintentional.   Saundra Shelling M.D on 12/13/2018 at 2:21 PM  Between 7am to 6pm - Pager - [phone] After 6pm go to [email or web address] - password [email or web address] [facility]  [place] [facility]  Office  [phone]  CC: Primary care physician; [name], MD

## 2018-12-13 NOTE — Progress Notes (Signed)
[place]  Telephone:([phone] [phone]   Name: Collin Gonzales Date: 12/13/2018 MRN: [id number]  DOB: [birth year]  Patient Care Team: [name], MD as PCP - General (Family Medicine) [name], MD as Referring Physician (Ophthalmology) [name], MD as Consulting Physician (Rheumatology) [name], MD as Referring Physician (Specialist) [name], MD as Consulting Physician (Urology) [name], MD as Referring Physician (Radiation Oncology)    REASON FOR CONSULTATION: Palliative Care consult requested for [phone] y.o.malewith multiple medical problems including locally advanced high-grade urothelial carcinoma. PMH also notable for O2 dependent COPD, PET positive lung mass status post SBRT, prostate cancer status post brachii therapy seed implant, history of CVA. Patient is felt not to be a surgical candidate for his urothelial carcinoma and treatment options are felt to be somewhat limited based on frailty.   He has been receiving Tecentriq.  Patient has had worsening penile pain and was evaluated by urology on 12/06/2018 and diagnosed with balanoposthitis.  He was subsequently admitted to the hospital on 12/11/2018 with worsening groin pain.  He was admitted with possible sepsis from UTI and pneumonia.  He was referred to palliative care to help address goals and manage ongoing symptoms.   CODE STATUS: DNR  PAST MEDICAL HISTORY: Past Medical History:  Diagnosis Date  . Arthritis   . Collagen vascular disease (HCC)    rheumatoid arthritis  . COPD (chronic obstructive pulmonary disease) ([place])   . GERD (gastroesophageal reflux disease)   . History of prostate cancer   . HLD (hyperlipidemia)   . HOH (hard of hearing)   . Insomnia   . Oxygen deficit    2L HS AND PRN  . Primary cancer of right lung ([place]) 2017   rad tx's.  . Prostate cancer ([place]) 2006   Rad seed  tx's.  . Stroke ([place])    2000  . Urothelial carcinoma of bladder ([place]) 10/2017   TURP to Prostate and chemo tx's.    PAST SURGICAL HISTORY:  Past Surgical History:  Procedure Laterality Date  . APPENDECTOMY  1965  . CATARACT EXTRACTION W/PHACO Right 01/05/2016   Procedure: CATARACT EXTRACTION PHACO AND INTRAOCULAR LENS PLACEMENT (IOC);  Surgeon: [name], MD;  Location: [facility] ORS;  Service: Ophthalmology;  Laterality: Right;  [place] 1.14 AP% 18.1 CDE 13.44 FLUID PACK LOT # [id number] H  . CYSTOSCOPY W/ RETROGRADES Bilateral 11/01/2017   Procedure: CYSTOSCOPY WITH RETROGRADE PYELOGRAM;  Surgeon: Hollice Espy, MD;  Location: [facility] ORS;  Service: Urology;  Laterality: Bilateral;  . ENDOBRONCHIAL ULTRASOUND N/A 02/26/2015   Procedure: ENDOBRONCHIAL ULTRASOUND;  Surgeon: Flora Lipps, MD;  Location: [facility] ORS;  Service: Cardiopulmonary;  Laterality: N/A;  . [place]  . INSERTION PROSTATE RADIATION SEED  2002  . PORTA CATH INSERTION N/A 05/14/2018   Procedure: PORTA CATH INSERTION;  Surgeon: Algernon Huxley, MD;  Location: [facility];  Service: Cardiovascular;  Laterality: N/A;  . TRANSURETHRAL RESECTION OF PROSTATE N/A 11/01/2017   Procedure: TRANSURETHRAL RESECTION OF THE PROSTATE (TURP) (channel TURP);  Surgeon: Hollice Espy, MD;  Location: [facility] ORS;  Service: Urology;  Laterality: N/A;    HEMATOLOGY/ONCOLOGY HISTORY:  Oncology History  Urothelial carcinoma of bladder ([place])  11/12/2017 Initial Diagnosis   Urothelial carcinoma of bladder ([place])   11/23/2017 -  Chemotherapy   The patient had atezolizumab (TECENTRIQ) 1,200 mg in sodium chloride 0.9 % 250 mL chemo infusion, 1,200 mg, Intravenous, Once, 17 of 20  cycles Administration: 1,200 mg (11/23/2017), 1,200 mg (12/14/2017), 1,200 mg (01/04/2018), 1,200 mg (01/25/2018), 1,200 mg (03/22/2018), 1,200 mg (02/28/2018), 1,200 mg (04/12/2018), 1,200 mg (05/30/2018), 1,200 mg (06/20/2018), 1,200 mg (07/11/2018), 1,200 mg (08/01/2018),  1,200 mg (08/22/2018), 1,200 mg (09/13/2018), 1,200 mg (10/04/2018), 1,200 mg (10/25/2018), 1,200 mg (11/15/2018), 1,200 mg (12/06/2018)  for chemotherapy treatment.      ALLERGIES:  is allergic to plaquenil [hydroxychloroquine] and simvastatin.  MEDICATIONS:  Current Facility-Administered Medications  Medication Dose Route Frequency Provider Last Rate Last Dose  . 0.9 %  sodium chloride infusion   Intravenous Continuous Gouru, Aruna, MD 75 mL/hr at 12/13/18 1514    . acetaminophen (TYLENOL) tablet 650 mg  650 mg Oral Q6H PRN Lance Coon, MD       Or  . acetaminophen (TYLENOL) suppository 650 mg  650 mg Rectal Q6H PRN Lance Coon, MD      . ceFEPIme (MAXIPIME) 2 g in sodium chloride 0.9 % 100 mL IVPB  2 g Intravenous Q12H Gouru, Aruna, MD 200 mL/hr at 12/13/18 1235 2 g at 12/13/18 1235  . enoxaparin (LOVENOX) injection 30 mg  30 mg Subcutaneous Q24H Lance Coon, MD   30 mg at 12/13/18 1003  . feeding supplement (ENSURE ENLIVE) (ENSURE ENLIVE) liquid 237 mL  237 mL Oral BID BM Gouru, Aruna, MD   237 mL at 12/13/18 1011  . fluticasone furoate-vilanterol (BREO ELLIPTA) 100-25 MCG/INH 1 puff  1 puff Inhalation Daily Lance Coon, MD   1 puff at 12/13/18 1012   And  . umeclidinium bromide (INCRUSE ELLIPTA) 62.5 MCG/INH 1 puff  1 puff Inhalation Daily Lance Coon, MD   1 puff at 12/13/18 1011  . Gerhardt's butt cream   Topical TID Gouru, Aruna, MD      . megestrol (MEGACE) tablet 20 mg  20 mg Oral BID Lance Coon, MD   20 mg at 12/13/18 1001  . methotrexate (RHEUMATREX) tablet 10 mg  10 mg Oral Q Thu Nazari, Walid A, RPH      . morphine 2 MG/ML injection 2 mg  2 mg Intravenous Q4H PRN Gouru, Aruna, MD   2 mg at 12/13/18 1227  . ondansetron (ZOFRAN) tablet 4 mg  4 mg Oral Q6H PRN Lance Coon, MD       Or  . ondansetron [facility]) injection 4 mg  4 mg Intravenous Q6H PRN Lance Coon, MD      . oxyCODONE (Oxy IR/ROXICODONE) immediate release tablet 10 mg  10 mg Oral Q4H PRN Gouru, Aruna, MD    10 mg at 12/13/18 1514  . QUEtiapine (SEROQUEL) tablet 50 mg  50 mg Oral Corwin Levins, MD   50 mg at 12/12/18 2115    VITAL SIGNS: BP (!) 93/55 (BP Location: Right Arm)   Pulse 77   Temp 98.2 F (36.8 C) (Oral)   Resp 18   Ht 5\' 7"  (1.702 m)   Wt 102 lb (46.3 kg)   SpO2 96%   BMI 15.98 kg/m  Filed Weights   12/11/18 1824  Weight: 102 lb (46.3 kg)    Estimated body mass index is 15.98 kg/m as calculated from the following:   Height as of this encounter: 5\' 7"  (1.702 m).   Weight as of this encounter: 102 lb (46.3 kg).  LABS: CBC:    Component Value Date/Time   WBC 8.1 12/12/2018 0555   HGB 8.6 (L) 12/12/2018 0555   HGB 12.3 (L) 11/21/2014 1010   HCT 28.6 (L) 12/12/2018 2423  HCT 39.3 11/21/2014 1010   PLT 273 12/12/2018 0555   PLT 387 (H) 11/21/2014 1010   MCV 93.2 12/12/2018 0555   MCV 95 11/21/2014 1010   MCV 95 01/05/2014 0429   NEUTROABS 7.9 (H) 12/11/2018 1840   NEUTROABS 3.2 11/21/2014 1010   NEUTROABS 5.3 01/05/2014 0429   LYMPHSABS 0.9 12/11/2018 1840   LYMPHSABS 1.7 11/21/2014 1010   LYMPHSABS 0.4 (L) 01/05/2014 0429   MONOABS 0.3 12/11/2018 1840   MONOABS 0.2 01/05/2014 0429   EOSABS 0.1 12/11/2018 1840   EOSABS 0.1 11/21/2014 1010   EOSABS 0.0 01/05/2014 0429   BASOSABS 0.0 12/11/2018 1840   BASOSABS 0.0 11/21/2014 1010   BASOSABS 0.0 01/05/2014 0429   Comprehensive Metabolic Panel:    Component Value Date/Time   NA 141 12/12/2018 0555   NA 139 12/08/2014 1004   NA 138 01/04/2014 0430   K 4.7 12/12/2018 0555   K 4.4 01/04/2014 0430   CL 109 12/12/2018 0555   CL 102 01/04/2014 0430   CO2 25 12/12/2018 0555   CO2 30 01/04/2014 0430   BUN 41 (H) 12/12/2018 0555   BUN 15 12/08/2014 1004   BUN 17 01/04/2014 0430   CREATININE 1.16 12/12/2018 0555   CREATININE 1.17 10/11/2017 1239   GLUCOSE 96 12/12/2018 0555   GLUCOSE 121 (H) 01/04/2014 0430   CALCIUM 8.8 (L) 12/12/2018 0555   CALCIUM 8.4 (L) 01/04/2014 0430   AST 33 12/11/2018 1840    ALT 13 12/11/2018 1840   ALKPHOS 49 12/11/2018 1840   BILITOT 0.4 12/11/2018 1840   BILITOT 0.3 12/08/2014 1004   PROT 8.0 12/11/2018 1840   PROT 8.6 (H) 12/08/2014 1004   ALBUMIN 2.4 (L) 12/11/2018 1840   ALBUMIN 3.7 12/08/2014 1004    RADIOGRAPHIC STUDIES: Dg Chest Port 1 View  Result Date: 12/11/2018 CLINICAL DATA:  Fever EXAM: PORTABLE CHEST 1 VIEW COMPARISON:  October 28, 2018 FINDINGS: There is underlying emphysematous change with fibrosis throughout the lower lobes. There is patchy infiltrate superimposed on fibrosis in the right base. The heart size is normal. The pulmonary vascularity reflects the underlying emphysematous change. No adenopathy. Port-A-Cath tip is in the superior vena cava. There is a skin fold on the left but no evident pneumothorax. No bone lesions. IMPRESSION: Airspace opacity consistent with pneumonia in the right base, superimposed on emphysematous change in fibrosis. Heart size within normal limits. Stable pulmonary vascularity. No adenopathy evident. Port-A-Cath tip in superior vena cava.  Skin fold on left. Followup PA and lateral chest radiographs recommended in 3-4 weeks following trial of antibiotic therapy to ensure resolution and exclude underlying malignancy. Electronically Signed   By: Lowella Grip III M.D.   On: 12/11/2018 19:55    PERFORMANCE STATUS (ECOG) : 3 - Symptomatic, >50% confined to bed  Review of Systems Unless otherwise noted, a complete review of systems is negative.  Physical Exam General: NAD, frail appearing, thin Pulmonary: Unlabored Extremities: no edema, no joint deformities Skin: Penile wound noted but not visualized Neurological: Weakness but otherwise nonfocal  IMPRESSION: Patient reports feeling overall improved today.  He reports that pain is better.  Oxycodone was increased to 10 mg tablets to reduce pill burden.  In past 24 hours, patient has only received 30 mg of oxycodone.  If as needed burden is too high, may  consider adding a long-acting opioid.  Would likely recommend home health involvement at time of discharge.  He has scheduled follow-up with Dr. [name] on 12/27/2018 and I can  also see him at that time to further discussions regarding goals.  PLAN: -Continue current scope of treatment -Will schedule outpatient follow up in the palliative care clinic   Time Total: 15 minutes  Visit consisted of counseling and education dealing with the complex and emotionally intense issues of symptom management and palliative care in the setting of serious and potentially life-threatening illness.Greater than 50%  of this time was spent counseling and coordinating care related to the above assessment and plan.  Signed by: Altha Harm, PhD, NP-C [phone] (Work Cell)

## 2018-12-13 NOTE — Progress Notes (Signed)
Notified MD Pyreddy that  Collin Gonzales Daughter   [phone]  Would like update from MD

## 2018-12-14 MED ORDER — AMOXICILLIN-POT CLAVULANATE 875-125 MG PO TABS
1.0000 | ORAL_TABLET | Freq: Two times a day (BID) | ORAL | Status: DC
  Administered 2018-12-14 – 2018-12-20 (×13): 1 via ORAL
  Filled 2018-12-14 (×13): qty 1

## 2018-12-14 MED ORDER — DOCUSATE SODIUM 100 MG PO CAPS
100.0000 mg | ORAL_CAPSULE | Freq: Two times a day (BID) | ORAL | Status: DC
  Administered 2018-12-14 – 2018-12-19 (×9): 100 mg via ORAL
  Filled 2018-12-14 (×10): qty 1

## 2018-12-14 MED ORDER — POLYETHYLENE GLYCOL 3350 17 G PO PACK
17.0000 g | PACK | Freq: Every day | ORAL | Status: DC | PRN
  Administered 2018-12-17 – 2018-12-19 (×3): 17 g via ORAL
  Filled 2018-12-14 (×3): qty 1

## 2018-12-14 NOTE — Progress Notes (Signed)
[place] [facility] [place] NAME: Collin Gonzales    MR#:  [id number]  DATE OF BIRTH:  1935/12/02  SUBJECTIVE:  Seen and evaluated today Generalized weakness No fever and chills Tolerating diet okay Tolerating pain better  REVIEW OF SYSTEMS:  CONSTITUTIONAL: No fever, has fatigue and weakness.  EYES: No blurred or double vision.  EARS, NOSE, AND THROAT: No tinnitus or ear pain.  RESPIRATORY: No cough, shortness of breath, wheezing or hemoptysis.  CARDIOVASCULAR: No chest pain, orthopnea, edema.  GASTROINTESTINAL: No nausea, vomiting, diarrhea or abdominal pain.  GENITOURINARY: Reporting pain and swelling in the penile area with some discharge SKIN: No rash or lesion. MUSCULOSKELETAL: No joint pain or arthritis.   NEUROLOGIC: No tingling, numbness, weakness.  PSYCHIATRY: No anxiety or depression.   DRUG ALLERGIES:   Allergies  Allergen Reactions  . Plaquenil [Hydroxychloroquine] Other (See Comments)    Bad dreams  . Simvastatin Other (See Comments)    Bad dreams    VITALS:  Blood pressure 105/61, pulse 78, temperature 98.7 F (37.1 C), temperature source Oral, resp. rate 18, height '5\' 7"'  (1.702 m), weight 46.3 kg, SpO2 95 %.  PHYSICAL EXAMINATION:  GENERAL:  83 y.o.-year-old patient lying in the bed with no acute distress.  EYES: Pupils equal, round, reactive to light and accommodation. No scleral icterus. Extraocular muscles intact.  HEENT: Head atraumatic, normocephalic. Oropharynx and nasopharynx clear.  NECK:  Supple, no jugular venous distention. No thyroid enlargement, no tenderness.  LUNGS: Normal breath sounds bilaterally, no wheezing, rales,rhonchi or crepitation. No use of accessory muscles of respiration.  CARDIOVASCULAR: S1, S2 normal. No murmurs, rubs, or gallops.  ABDOMEN: Soft, nontender, nondistended. Bowel sounds present.  Genitourinary-penis is tender and edematous with some sanguinous discharge EXTREMITIES:  No pedal edema, cyanosis, or clubbing.  NEUROLOGIC: Cranial nerves II through XII are intact. Muscle strength global weakness in all extremities. Sensation intact. Gait not checked.  PSYCHIATRIC: The patient is alert and oriented x 3.  SKIN: Pressure injuries of the right hip and sacrum bandaged Genitourinary phimosis noted Uncircumcised phallus Ulceration of the foreskin, penile tip and midline scrotum   LABORATORY PANEL:   CBC Recent Labs  Lab 12/12/18 0555  WBC 8.1  HGB 8.6*  HCT 28.6*  PLT 273   ------------------------------------------------------------------------------------------------------------------  Chemistries  Recent Labs  Lab 12/11/18 1840 12/12/18 0555  NA 141 141  K 4.5 4.7  CL 107 109  CO2 26 25  GLUCOSE 122* 96  BUN 42* 41*  CREATININE 1.35* 1.16  CALCIUM 8.9 8.8*  AST 33  --   ALT 13  --   ALKPHOS 49  --   BILITOT 0.4  --    ------------------------------------------------------------------------------------------------------------------  Cardiac Enzymes No results for input(s): TROPONINI in the last 168 hours. ------------------------------------------------------------------------------------------------------------------  RADIOLOGY:  No results found.  EKG:   Orders placed or performed during the hospital encounter of 10/26/18  . ED EKG  . ED EKG    ASSESSMENT AND PLAN:  83 year old male patient with history of collagen vascular disease, rheumatoid arthritis, COPD, GERD, prostate cancer, hyperlipidemia, right lung cancer, stroke, urothelial carcinoma bladder currently under hospitalist service  #Sepsis-could be from UTI, healthcare associated pneumonia and balanoposthitis Patient met septic criteria at the time of admission with fever and hypotension Urine cultures shows multiple species Discontinue IV abx Start oral abx  # Balanoposthitis with locally advanced high-grade urothelial carcinoma Patient has been following up with   urology and oncology Seen by palliative care  in the past Not a surgical candidate Palliative care team on board  #Healthcare associated pneumonia Vancomycin antibiotic discontinued Discontinue cefepime Start oral augmentin  #AKI improving with IV fluids avoid nephrotoxins  #Chronic history of rheumatoid arthritis continue home medication methotrexate  #Chronic history of COPD no exacerbation at this time continue inhalers  #Ambulatory dysfunction Physical therapy evaluation appreciated SNF placement recommended Family unable to afford the cost Family unable to take of patient at home too Will discuss with SW regarding disposition   All the records are reviewed and case discussed with Care Management/Social Workerr. Management plans discussed with the patient, daughter [name] [phone] and they are in agreement.  CODE STATUS: DNR daughter [name] is healthcare power of attorney  TOTAL TIME TAKING CARE OF THIS PATIENT: 35  minutes.   POSSIBLE D/C IN 2 DAYS, DEPENDING ON CLINICAL CONDITION.  Note: This dictation was prepared with Dragon dictation along with smaller phrase technology. Any transcriptional errors that result from this process are unintentional.   Saundra Shelling M.D on 12/14/2018 at 10:32 AM  Between 7am to 6pm - Pager - [phone] After 6pm go to [email or web address] - password [email or web address] [facility]  [place] [facility]  Office  [phone]  CC: Primary care physician; [name], MD

## 2018-12-14 NOTE — Care Management Important Message (Signed)
Important Message  Patient Details  Name: Collin Gonzales MRN: [id number] Date of Birth: 15-Sep-1935   Medicare Important Message Given:  Yes     Juliann Pulse A Jamieson Lisa 12/14/2018, 11:00 AM

## 2018-12-14 NOTE — NC FL2 (Signed)
[place] LEVEL OF CARE SCREENING TOOL     IDENTIFICATION  Patient Name: Collin Gonzales Birthdate: 02/07/1936 Sex: male Admission Date (Current Location): 12/11/2018  [place] and [place] Number:  Engineering geologist and Address:  [facility], [address], [place], [place] [address]      Provider Number: [id number]  Attending Physician Name and Address:  Saundra Shelling, MD  Relative Name and Phone Number:  Tranell Wojtkiewicz  [phone]    Current Level of Care: Hospital Recommended Level of Care: [place] Prior Approval Number:    Date Approved/Denied:   PASRR Number: [id number]  Discharge Plan: SNF    Current Diagnoses: Patient Active Problem List   Diagnosis Date Noted  . Protein-calorie malnutrition, severe 12/13/2018  . Palliative care encounter   . Balanoposthitis 12/11/2018  . AKI (acute kidney injury) ([place]) 12/11/2018  . Rhabdomyolysis 10/27/2018  . Aortic atherosclerosis ([place]) 07/20/2018  . Thrombocytopenia, unspecified ([place]) 07/20/2018  . Urothelial carcinoma ([place]) 07/20/2018  . Urothelial carcinoma of bladder ([place]) 11/12/2017  . Urinary retention 11/01/2017  . Dependence on supplemental oxygen 04/24/2017  . Malnutrition ([place]) 04/24/2017  . Hemiparesis affecting left side as late effect of cerebrovascular accident (CVA) ([place]) 04/24/2017  . HCAP (healthcare-associated pneumonia) 03/01/2017  . UTI (urinary tract infection) 09/24/2016  . Cataract 09/06/2016  . Drug-induced folate deficiency anemia 09/06/2016  . Esophagitis, reflux 09/06/2016  . Iron deficiency anemia due to chronic blood loss 08/23/2016  . Decrease in appetite 09/01/2015  . GERD (gastroesophageal reflux disease) 08/05/2015  . COPD (chronic obstructive pulmonary disease) ([place]) 08/20/2014  . Dyslipidemia 08/20/2014  . Chronic insomnia 08/20/2014  . Arthritis or polyarthritis, rheumatoid ([place]) 08/20/2014  . History of  prostate cancer 11/01/2013  . Flutter-fibrillation ([place]) 03/18/2005    Orientation RESPIRATION BLADDER Height & Weight     Time, Self, Situation, Place  O2(Chronic O2 at 3L) Incontinent Weight: 46.3 kg Height:  5\' 7"  (170.2 cm)  BEHAVIORAL SYMPTOMS/MOOD NEUROLOGICAL BOWEL NUTRITION STATUS      Incontinent Diet(Regular)  AMBULATORY STATUS COMMUNICATION OF NEEDS Skin   Extensive Assist Verbally Other (Comment)(MASD, excoriation)                       Personal Care Assistance Level of Assistance              Functional Limitations Info             SPECIAL CARE FACTORS FREQUENCY  PT (By licensed PT), OT (By licensed OT)     PT Frequency: 5 times per week OT Frequency: 5 times per week            Contractures Contractures Info: Not present    Additional Factors Info  Code Status, Allergies Code Status Info: DNR Allergies Info: Plaquenil, simvastatin           Current Medications (12/14/2018):  This is the current hospital active medication list Current Facility-Administered Medications  Medication Dose Route Frequency Provider Last Rate Last Dose  . acetaminophen (TYLENOL) tablet 650 mg  650 mg Oral Q6H PRN Lance Coon, MD       Or  . acetaminophen (TYLENOL) suppository 650 mg  650 mg Rectal Q6H PRN Lance Coon, MD      . amoxicillin-clavulanate (AUGMENTIN) 875-125 MG per tablet 1 tablet  1 tablet Oral Q12H Saundra Shelling, MD   1 tablet at 12/14/18 1227  . Chlorhexidine Gluconate Cloth 2 % PADS 6 each  6 each Topical Daily Saundra Shelling, MD   6 each at 12/14/18 1034  . docusate sodium (COLACE) capsule 100 mg  100 mg Oral BID Saundra Shelling, MD   100 mg at 12/14/18 1227  . enoxaparin (LOVENOX) injection 30 mg  30 mg Subcutaneous Q24H Lance Coon, MD   30 mg at 12/14/18 5686  . feeding supplement (ENSURE ENLIVE) (ENSURE ENLIVE) liquid 237 mL  237 mL Oral BID BM Gouru, Aruna, MD   237 mL at 12/14/18 1400  . fluticasone furoate-vilanterol (BREO ELLIPTA)  100-25 MCG/INH 1 puff  1 puff Inhalation Daily Lance Coon, MD   1 puff at 12/14/18 1228   And  . umeclidinium bromide (INCRUSE ELLIPTA) 62.5 MCG/INH 1 puff  1 puff Inhalation Daily Lance Coon, MD   1 puff at 12/14/18 1028  . Gerhardt's butt cream   Topical TID Gouru, Aruna, MD      . megestrol (MEGACE) tablet 20 mg  20 mg Oral BID Lance Coon, MD   20 mg at 12/14/18 1027  . methotrexate (RHEUMATREX) tablet 10 mg  10 mg Oral Q Thu Nazari, Walid A, RPH   10 mg at 12/13/18 1945  . morphine 2 MG/ML injection 2 mg  2 mg Intravenous Q4H PRN Nicholes Mango, MD   2 mg at 12/14/18 1683  . ondansetron (ZOFRAN) tablet 4 mg  4 mg Oral Q6H PRN Lance Coon, MD       Or  . ondansetron [facility]) injection 4 mg  4 mg Intravenous Q6H PRN Lance Coon, MD      . oxyCODONE (Oxy IR/ROXICODONE) immediate release tablet 10 mg  10 mg Oral Q4H PRN Gouru, Aruna, MD   10 mg at 12/14/18 1027  . polyethylene glycol (MIRALAX / GLYCOLAX) packet 17 g  17 g Oral Daily PRN Pyreddy, Reatha Harps, MD      . QUEtiapine (SEROQUEL) tablet 50 mg  50 mg Oral Corwin Levins, MD   50 mg at 12/13/18 2147  . sodium chloride flush (NS) 0.9 % injection 10-40 mL  10-40 mL Intracatheter PRN Hollice Espy, MD         Discharge Medications: Please see discharge summary for a list of discharge medications.  Relevant Imaging Results:  Relevant Lab Results:   Additional Information [id number]  Shelbie Hutching, RN

## 2018-12-14 NOTE — TOC Progression Note (Signed)
Transition of Care [facility]) - Progression Note    Patient Details  Name: MAURION WALKOWIAK MRN: [id number] Date of Birth: [birth year]  [facility]) CM/SW Contact  Shelbie Hutching, RN Phone Number: 12/14/2018, 4:24 PM  Clinical Narrative:    Patient reports that he cannot go home, he will not be able to take care of himself and there is not anyone that will be able to care for him.  Patient wants to pursue SNF and then long term care.  RNCM will start bed search.     Expected Discharge Plan: [place] Barriers to Discharge: Continued Medical Work up  Expected Discharge Plan and Services Expected Discharge Plan: [place] Choice: [place] arrangements for the past 2 months: [place]: RN, Nurse's Aide [place] Agency: [place] ([facility]) Date [place]: 12/12/18   Representative spoke with at [place]: Floydene Flock   Social Determinants of Health (SDOH) Interventions    Readmission Risk Interventions Readmission Risk Prevention Plan 12/12/2018 10/28/2018  Transportation Screening Complete Complete  PCP or Specialist Appt within 3-5 Days Complete -  HRI or Home Care Consult Complete -  Social Work Consult for [place] Planning/Counseling Complete Complete  Palliative Care Screening Complete Not Applicable  Medication Review Press photographer) Complete Complete  Some recent data might be hidden

## 2018-12-14 NOTE — Progress Notes (Signed)
[facility] [place] NAME: Collin Gonzales    MR#:  [id number]  DATE OF BIRTH:  12/03/1935  SUBJECTIVE:  Seen and evaluated today Generalized weakness No fever and chills Tolerating diet okay Tolerating pain better  REVIEW OF SYSTEMS:  CONSTITUTIONAL: No fever, has fatigue and weakness.  EYES: No blurred or double vision.  EARS, NOSE, AND THROAT: No tinnitus or ear pain.  RESPIRATORY: No cough, shortness of breath, wheezing or hemoptysis.  CARDIOVASCULAR: No chest pain, orthopnea, edema.  GASTROINTESTINAL: No nausea, vomiting, diarrhea or abdominal pain.  GENITOURINARY: Reporting pain and swelling in the penile area with some discharge SKIN: No rash or lesion. MUSCULOSKELETAL: No joint pain or arthritis.   NEUROLOGIC: No tingling, numbness, weakness.  PSYCHIATRY: No anxiety or depression.   DRUG ALLERGIES:   Allergies  Allergen Reactions  . Plaquenil [Hydroxychloroquine] Other (See Comments)    Bad dreams  . Simvastatin Other (See Comments)    Bad dreams    VITALS:  Blood pressure 105/61, pulse 78, temperature 98.7 F (37.1 C), temperature source Oral, resp. rate 18, height '5\' 7"'  (1.702 m), weight 46.3 kg, SpO2 95 %.  PHYSICAL EXAMINATION:  GENERAL:  83 y.o.-year-old patient lying in the bed with no acute distress.  EYES: Pupils equal, round, reactive to light and accommodation. No scleral icterus. Extraocular muscles intact.  HEENT: Head atraumatic, normocephalic. Oropharynx and nasopharynx clear.  NECK:  Supple, no jugular venous distention. No thyroid enlargement, no tenderness.  LUNGS: Normal breath sounds bilaterally, no wheezing, rales,rhonchi or crepitation. No use of accessory muscles of respiration.  CARDIOVASCULAR: S1, S2 normal. No murmurs, rubs, or gallops.  ABDOMEN: Soft, nontender, nondistended. Bowel sounds present.  Genitourinary-penis is tender and edematous with some sanguinous discharge EXTREMITIES:  No pedal edema, cyanosis, or clubbing.  NEUROLOGIC: Cranial nerves II through XII are intact. Muscle strength global weakness in all extremities. Sensation intact. Gait not checked.  PSYCHIATRIC: The patient is alert and oriented x 3.  SKIN: Pressure injuries of the right hip and sacrum bandaged Genitourinary phimosis noted Uncircumcised phallus Ulceration of the foreskin, penile tip and midline scrotum   LABORATORY PANEL:   CBC Recent Labs  Lab 12/12/18 0555  WBC 8.1  HGB 8.6*  HCT 28.6*  PLT 273   ------------------------------------------------------------------------------------------------------------------  Chemistries  Recent Labs  Lab 12/11/18 1840 12/12/18 0555  NA 141 141  K 4.5 4.7  CL 107 109  CO2 26 25  GLUCOSE 122* 96  BUN 42* 41*  CREATININE 1.35* 1.16  CALCIUM 8.9 8.8*  AST 33  --   ALT 13  --   ALKPHOS 49  --   BILITOT 0.4  --    ------------------------------------------------------------------------------------------------------------------  Cardiac Enzymes No results for input(s): TROPONINI in the last 168 hours. ------------------------------------------------------------------------------------------------------------------  RADIOLOGY:  No results found.  EKG:   Orders placed or performed during the hospital encounter of 10/26/18  . ED EKG  . ED EKG    ASSESSMENT AND PLAN:  83 year old male patient with history of collagen vascular disease, rheumatoid arthritis, COPD, GERD, prostate cancer, hyperlipidemia, right lung cancer, stroke, urothelial carcinoma bladder currently under hospitalist service  #Sepsis-could be from UTI, healthcare associated pneumonia and balanoposthitis Patient met septic criteria at the time of admission with fever and hypotension Urine cultures shows multiple species Discontinue IV abx Start oral abx  # Balanoposthitis with locally advanced high-grade urothelial carcinoma Patient has been following up with   urology and oncology Seen by palliative care  in the past Not a surgical candidate Palliative care team on board  #Healthcare associated pneumonia Vancomycin antibiotic discontinued Discontinue cefepime Start oral augmentin  #AKI improving with IV fluids avoid nephrotoxins  #Chronic history of rheumatoid arthritis continue home medication methotrexate  #Chronic history of COPD no exacerbation at this time continue inhalers  #Ambulatory dysfunction Physical therapy evaluation appreciated SNF placement recommended Family unable to afford the cost Will discuss with social worker for rehab placement and then transitioning to long-term care along with financial discussions with patient's family Collin Gonzales Will discuss with SW regarding disposition   All the records are reviewed and case discussed with Care Management/Social Workerr. Management plans discussed with the patient, daughter Collin Gonzales [phone] and they are in agreement.  CODE STATUS: DNR daughter Collin Gonzales is healthcare power of attorney  TOTAL TIME TAKING CARE OF THIS PATIENT: 34  minutes.   POSSIBLE D/C IN 2 DAYS, DEPENDING ON CLINICAL CONDITION.  Note: This dictation was prepared with Dragon dictation along with smaller phrase technology. Any transcriptional errors that result from this process are unintentional.   Saundra Shelling M.D on 12/14/2018 at 1:35 PM  Between 7am to 6pm - Pager - [phone] After 6pm go to [email or web address] - password [email or web address] [facility]  [place] [facility]  Office  [phone]  CC: Primary care physician; [name], MD

## 2018-12-14 NOTE — Progress Notes (Signed)
Physical Therapy Treatment Patient Details Name: Collin Gonzales MRN: [id number] DOB: 02/20/1936 Today's Date: 12/14/2018    History of Present Illness 83 y.o. male who presents with chief complaint of worsening penile pain.  He states that he was recently seen by urology and was diagnosed with balanitis, and given nystatin cream.  He says that he has been applying this cream, but that he has not had improvement in his symptoms.      PT Comments    Pt indicated that he is highly motivated to get up and try today however he needed excruciatingly long time and needed excessive cuing and encouragement just to get to sitting (which he did mostly w/o phyiscal assist but took ~20 minutes to slowly edge to the EOB).  He was able to ambulate better than expected per his struggles with transfer to sitting, however he was still unsafe, walker reliant and did have an issues with bowel and bladder and the need to get back to bed to clean up.  Apparently payor for STR may be an issue, however at this time pt is completely unsafe to go home and the amount of assist that family, friends, etc will be able to provide likely will not be sufficient for his safety.  PT is still recommending rehab from a clinical stand-point, if pt's pain was to improve significantly it is likely that his mobility and safety would also improve as well.   Follow Up Recommendations  SNF;Supervision/Assistance - 24 hour     Equipment Recommendations  (if pt is to go home may need hospital bed)    Recommendations for Other Services       Precautions / Restrictions Precautions Precautions: Fall Restrictions Weight Bearing Restrictions: No    Mobility  Bed Mobility Overal bed mobility: Needs Assistance Bed Mobility: Supine to Sit     Supine to sit: Min assist     General bed mobility comments: Pt unable to move more than 1-2" at a time and very, very, very slowly was able to edge himself toward EOB and attain sitting  with only rare light assist.    Transfers Overall transfer level: Needs assistance   Transfers: Sit to/from Stand Sit to Stand: Min assist         General transfer comment: Pt was able to rise to standing with only cues for set up/sequencing and light assist, though he clearly was uncomfortable and highly guarded  Ambulation/Gait Ambulation/Gait assistance: Min guard Gait Distance (Feet): 35 Feet Assistive device: Rolling walker (2 wheeled)       General Gait Details: Pt with uncoordinated gait with heavy reliance on walker and inconsistent pattern.  During ambulation pt lost control of bladder and bowels and needed to return to bed for clean up.  Pt did not have any overt LOBs and considering the difficulty with getting to bed did okay with ambulation, but ultimately he is unsafe on many levels with ambulation.   Stairs             Wheelchair Mobility    Modified Rankin (Stroke Patients Only)       Balance Overall balance assessment: Needs assistance   Sitting balance-Leahy Scale: Poor Sitting balance - Comments: Pt struggles to tolerate sitting position well secondary to penile pain     Standing balance-Leahy Scale: Fair Standing balance comment: relaint on walker, poor foot placement awareness with ambulation, poor standing tolerance secondary to pain  Cognition Arousal/Alertness: Awake/alert Behavior During Therapy: Anxious Overall Cognitive Status: Within Functional Limits for tasks assessed                                        Exercises      General Comments        Pertinent Vitals/Pain Pain Assessment: 0-10 Pain Score: 10-Worst pain ever Pain Location: penis    Home Living                      Prior Function            PT Goals (current goals can now be found in the care plan section) Progress towards PT goals: Progressing toward goals    Frequency    Min  2X/week      PT Plan Current plan remains appropriate    Co-evaluation              AM-PAC PT "6 Clicks" Mobility   Outcome Measure  Help needed turning from your back to your side while in a flat bed without using bedrails?: A Little Help needed moving from lying on your back to sitting on the side of a flat bed without using bedrails?: A Lot Help needed moving to and from a bed to a chair (including a wheelchair)?: A Little Help needed standing up from a chair using your arms (e.g., wheelchair or bedside chair)?: A Little Help needed to walk in hospital room?: A Lot Help needed climbing 3-5 steps with a railing? : A Lot 6 Click Score: 15    End of Session Equipment Utilized During Treatment: Gait belt;Oxygen Activity Tolerance: Patient limited by pain Patient left: with bed alarm set;with call bell/phone within reach   PT Visit Diagnosis: Difficulty in walking, not elsewhere classified (R26.2);Muscle weakness (generalized) (M62.81)     Time: 0300-9233 PT Time Calculation (min) (ACUTE ONLY): 44 min  Charges:  $Gait Training: 8-22 mins $Therapeutic Activity: 23-37 mins                     Kreg Shropshire, DPT 12/14/2018, 1:56 PM

## 2018-12-15 NOTE — Progress Notes (Signed)
[facility] [place] NAME: Collin Gonzales    MR#:  [id number]  DATE OF BIRTH:  Jul 12, 1935  SUBJECTIVE:  Seen and evaluated today Generalized weakness No fever and chills Tolerating diet okay Tolerating pain better  REVIEW OF SYSTEMS:  CONSTITUTIONAL: No fever, has fatigue and weakness.  EYES: No blurred or double vision.  EARS, NOSE, AND THROAT: No tinnitus or ear pain.  RESPIRATORY: No cough, shortness of breath, wheezing or hemoptysis.  CARDIOVASCULAR: No chest pain, orthopnea, edema.  GASTROINTESTINAL: No nausea, vomiting, diarrhea or abdominal pain.  GENITOURINARY: Reporting pain and swelling in the penile area with some discharge SKIN: No rash or lesion. MUSCULOSKELETAL: No joint pain or arthritis.   NEUROLOGIC: No tingling, numbness, weakness.  PSYCHIATRY: No anxiety or depression.   DRUG ALLERGIES:   Allergies  Allergen Reactions  . Plaquenil [Hydroxychloroquine] Other (See Comments)    Bad dreams  . Simvastatin Other (See Comments)    Bad dreams    VITALS:  Blood pressure 109/66, pulse 78, temperature 98.5 F (36.9 C), temperature source Oral, resp. rate 18, height '5\' 7"'  (1.702 m), weight 46.3 kg, SpO2 94 %.  PHYSICAL EXAMINATION:  GENERAL:  83 y.o.-year-old patient lying in the bed with no acute distress.  EYES: Pupils equal, round, reactive to light and accommodation. No scleral icterus. Extraocular muscles intact.  HEENT: Head atraumatic, normocephalic. Oropharynx and nasopharynx clear.  NECK:  Supple, no jugular venous distention. No thyroid enlargement, no tenderness.  LUNGS: Normal breath sounds bilaterally, no wheezing, rales,rhonchi or crepitation. No use of accessory muscles of respiration.  CARDIOVASCULAR: S1, S2 normal. No murmurs, rubs, or gallops.  ABDOMEN: Soft, nontender, nondistended. Bowel sounds present.  Genitourinary-penis is tender and edematous with some sanguinous discharge EXTREMITIES:  No pedal edema, cyanosis, or clubbing.  NEUROLOGIC: Cranial nerves II through XII are intact. Muscle strength global weakness in all extremities. Sensation intact. Gait not checked.  PSYCHIATRIC: The patient is alert and oriented x 3.  SKIN: Pressure injuries of the right hip and sacrum bandaged Genitourinary phimosis noted Uncircumcised phallus Ulceration of the foreskin, penile tip and midline scrotum   LABORATORY PANEL:   CBC Recent Labs  Lab 12/12/18 0555  WBC 8.1  HGB 8.6*  HCT 28.6*  PLT 273   ------------------------------------------------------------------------------------------------------------------  Chemistries  Recent Labs  Lab 12/11/18 1840 12/12/18 0555  NA 141 141  K 4.5 4.7  CL 107 109  CO2 26 25  GLUCOSE 122* 96  BUN 42* 41*  CREATININE 1.35* 1.16  CALCIUM 8.9 8.8*  AST 33  --   ALT 13  --   ALKPHOS 49  --   BILITOT 0.4  --    ------------------------------------------------------------------------------------------------------------------  Cardiac Enzymes No results for input(s): TROPONINI in the last 168 hours. ------------------------------------------------------------------------------------------------------------------  RADIOLOGY:  No results found.  EKG:   Orders placed or performed during the hospital encounter of 10/26/18  . ED EKG  . ED EKG    ASSESSMENT AND PLAN:  83 year old male patient with history of collagen vascular disease, rheumatoid arthritis, COPD, GERD, prostate cancer, hyperlipidemia, right lung cancer, stroke, urothelial carcinoma bladder currently under hospitalist service  #Sepsis-could be from UTI, healthcare associated pneumonia and balanoposthitis -Patient met septic criteria at the time of admission with fever and hypotension Urine cultures shows multiple species -Discontinue IV abx now on oral abx  # Balanoposthitis with locally advanced high-grade urothelial carcinoma Patient has been following up  with  urology and oncology -Not a surgical  candidate -Palliative care team on board  #Healthcare associated pneumonia -Vancomycin antibiotic discontinued -Discontinue cefepime -now on oral augmentin  #AKI improving with IV fluids avoid nephrotoxins  #Chronic history of rheumatoid arthritis continue home medication methotrexate  #Chronic history of COPD no exacerbation at this time continue inhalers  #Ambulatory dysfunction Physical therapy evaluation appreciated SNF placement recommended Family unable to afford the cost Family unable to take of patient at home too Will discuss with SW regarding disposition   All the records are reviewed and case discussed with Care Management/Social Workerr.  patient, daughter Collin Gonzales [phone] and they are in agreement.  CODE STATUS: DNR daughter Collin Gonzales is healthcare power of attorney  TOTAL TIME TAKING CARE OF THIS PATIENT: 25  minutes.   POSSIBLE D/C IN 2 DAYS, DEPENDING ON CLINICAL CONDITION.  Note: This dictation was prepared with Dragon dictation along with smaller phrase technology. Any transcriptional errors that result from this process are unintentional.   Fritzi Mandes M.D on 12/15/2018 at 2:06 PM  Between 7am to 6pm - Pager - [phone] After 6pm go to [email or web address] - password [email or web address] [facility]  [place] [facility]  Office  [phone]  CC: Primary care physician; [name], MD

## 2018-12-15 NOTE — Plan of Care (Signed)

## 2018-12-16 LAB — CULTURE, BLOOD (ROUTINE X 2)
Culture: NO GROWTH
Culture: NO GROWTH
Special Requests: ADEQUATE
Special Requests: ADEQUATE

## 2018-12-16 MED ORDER — ENOXAPARIN SODIUM 40 MG/0.4ML ~~LOC~~ SOLN
40.0000 mg | SUBCUTANEOUS | Status: DC
  Administered 2018-12-17 – 2018-12-19 (×3): 40 mg via SUBCUTANEOUS
  Filled 2018-12-16 (×4): qty 0.4

## 2018-12-16 MED ORDER — INFLUENZA VAC A&B SA ADJ QUAD 0.5 ML IM PRSY
0.5000 mL | PREFILLED_SYRINGE | INTRAMUSCULAR | Status: AC
  Administered 2018-12-17: 09:00:00 0.5 mL via INTRAMUSCULAR
  Filled 2018-12-16: qty 0.5

## 2018-12-16 NOTE — Progress Notes (Signed)
[place] [facility] [place] NAME: Collin Gonzales    MR#:  [id number]  DATE OF BIRTH:  [birth year]  SUBJECTIVE:   Generalized weakness No fever and chills Tolerating diet okay No issues per RN  REVIEW OF SYSTEMS:  CONSTITUTIONAL: No fever, has fatigue and weakness.  EYES: No blurred or double vision.  EARS, NOSE, AND THROAT: No tinnitus or ear pain.  RESPIRATORY: No cough, shortness of breath, wheezing or hemoptysis.  CARDIOVASCULAR: No chest pain, orthopnea, edema.  GASTROINTESTINAL: No nausea, vomiting, diarrhea or abdominal pain.  GENITOURINARY: Reporting pain and swelling in the penile area with some discharge SKIN: No rash or lesion. MUSCULOSKELETAL: No joint pain or arthritis.   NEUROLOGIC: No tingling, numbness, weakness.  PSYCHIATRY: No anxiety or depression.   DRUG ALLERGIES:   Allergies  Allergen Reactions  . Plaquenil [Hydroxychloroquine] Other (See Comments)    Bad dreams  . Simvastatin Other (See Comments)    Bad dreams    VITALS:  Blood pressure 98/69, pulse 79, temperature 98.4 F (36.9 C), temperature source Oral, resp. rate 18, height _0  (1.702 m), weight 46.3 kg, SpO2 96 %.  PHYSICAL EXAMINATION:  GENERAL:  83 y.o.-year-old patient lying in the bed with no acute distress.  EYES: Pupils equal, round, reactive to light and accommodation. No scleral icterus. Extraocular muscles intact.  HEENT: Head atraumatic, normocephalic. Oropharynx and nasopharynx clear.  NECK:  Supple, no jugular venous distention. No thyroid enlargement, no tenderness.  LUNGS: Normal breath sounds bilaterally, no wheezing, rales,rhonchi or crepitation. No use of accessory muscles of respiration.  CARDIOVASCULAR: S1, S2 normal. No murmurs, rubs, or gallops.  ABDOMEN: Soft, nontender, nondistended. Bowel sounds present.  Genitourinary-penis is tender and edematous with some sanguinous discharge EXTREMITIES: No pedal edema, cyanosis, or  clubbing.  NEUROLOGIC: Cranial nerves II through XII are intact. Muscle strength global weakness in all extremities. Sensation intact. Gait not checked.  PSYCHIATRIC: The patient is alert and oriented x 3.  SKIN: Pressure injuries of the right hip and sacrum bandaged Genitourinary phimosis noted Uncircumcised phallus Ulceration of the foreskin, penile tip and midline scrotum   LABORATORY PANEL:   CBC Recent Labs  Lab 12/12/18 0555  WBC 8.1  HGB 8.6*  HCT 28.6*  PLT 273   ------------------------------------------------------------------------------------------------------------------  Chemistries  Recent Labs  Lab 12/11/18 1840 12/12/18 0555  NA 141 141  K 4.5 4.7  CL 107 109  CO2 26 25  GLUCOSE 122* 96  BUN 42* 41*  CREATININE 1.35* 1.16  CALCIUM 8.9 8.8*  AST 33  --   ALT 13  --   ALKPHOS 49  --   BILITOT 0.4  --    ------------------------------------------------------------------------------------------------------------------  Cardiac Enzymes No results for input(s): TROPONINI in the last 168 hours. ------------------------------------------------------------------------------------------------------------------  RADIOLOGY:  No results found.  EKG:   Orders placed or performed during the hospital encounter of 10/26/18  . ED EKG  . ED EKG    ASSESSMENT AND PLAN:  83 year old male patient with history of collagen vascular disease, rheumatoid arthritis, COPD, GERD, prostate cancer, hyperlipidemia, right lung cancer, stroke, urothelial carcinoma bladder currently under hospitalist service  #Sepsis-could be from UTI, healthcare associated pneumonia and balanoposthitis -Patient met septic criteria at the time of admission with fever and hypotension Urine cultures shows multiple species -Discontinue IV abx now on oral abx with augmentin(for total10 days)  # Balanoposthitis with locally advanced high-grade urothelial carcinoma Patient has been following  up with  urology and oncology -Not  a surgical candidate -Palliative care team on board  #Healthcare associated pneumonia -Vancomycin antibiotic discontinued -Discontinue cefepime -now on oral augmentin  #AKI improving with IV fluids avoid nephrotoxins  #Chronic history of rheumatoid arthritis continue home medication methotrexate  #Chronic history of COPD no exacerbation at this time continue inhalers  #Ambulatory dysfunction Physical therapy evaluation appreciated SNF placement recommended Family unable to take of patient at home too Will discuss with SW regarding disposition--awaiting rehab bed  All the records are reviewed and case discussed with Care Management/Social Workerr.  patient, daughter [name] [phone] is aware of the plan  CODE STATUS: DNR daughter [name] is healthcare power of attorney  TOTAL TIME TAKING CARE OF THIS PATIENT: 25  minutes.   POSSIBLE D/C IN1-2 2 DAYS, DEPENDING ON CLINICAL CONDITION.  Note: This dictation was prepared with Dragon dictation along with smaller phrase technology. Any transcriptional errors that result from this process are unintentional.   Fritzi Mandes M.D on 12/16/2018 at 12:23 PM  Between 7am to 6pm - Pager - [phone] After 6pm go to [email or web address] - password [email or web address] [facility]  [place] [facility]  Office  [phone]  CC: Primary care physician; [name], MD

## 2018-12-16 NOTE — TOC Progression Note (Addendum)
Transition of Care (TOC) - Progression Note    Patient Details  Name: Collin Gonzales MRN: [id number] Date of Birth: 03/29/1935  Transition of Care (TOC) CM/SW Contact  ,  Michelle, LCSW Phone Number: 12/16/2018, 1:37 PM  Clinical Narrative:    This social worker met with patient at bedside for status update on bed offers. Both bed offers are in [place]. Patient hoping for something closer to home. Patient requested that this social worker speak with his daughter Veronica [phone]. Update on bed offers provided. Patient's daughter would prefer [facility] if available and ultimately long term care. Patient's daughter discussed having a history of seizures and is unable to drive at this time or provide 24 hour care for patient.  Social Work to continue to follow to assist with discharge planning.    , LCSW Clinical Social Work [phone]     Expected Discharge Plan: Skilled Nursing Facility Barriers to Discharge: Continued Medical Work up  Expected Discharge Plan and Services Expected Discharge Plan: Skilled Nursing Facility     Post Acute Care Choice: Home Health Living arrangements for the past 2 months: Single Family Home                           HH Arranged: RN, Nurse's Aide HH Agency: [facility] Date HH Agency Contacted: 12/12/18   Representative spoke with at HH Agency: Jason Hinton   Social Determinants of Health (SDOH) Interventions    Readmission Risk Interventions Readmission Risk Prevention Plan 12/12/2018 10/28/2018  Transportation Screening Complete Complete  PCP or Specialist Appt within 3-5 Days Complete -  HRI or Home Care Consult Complete -  Social Work Consult for Recovery Care Planning/Counseling Complete Complete  Palliative Care Screening Complete Not Applicable  Medication Review (RN Care Manager) Complete Complete  Some recent data might be hidden   

## 2018-12-16 NOTE — Progress Notes (Signed)
Anticoagulation monitoring(Lovenox):  83 yo  male ordered Lovenox 30 mg Q24h  Filed Weights   12/11/18 1824  Weight: 102 lb (46.3 kg)   BMI 16   Lab Results  Component Value Date   CREATININE 1.16 12/12/2018   CREATININE 1.35 (H) 12/11/2018   CREATININE 0.99 12/06/2018   Estimated Creatinine Clearance: 33.3 mL/min (by C-G formula based on SCr of 1.16 mg/dL). Hemoglobin & Hematocrit     Component Value Date/Time   HGB 8.6 (L) 12/12/2018 0555   HGB 12.3 (L) 11/21/2014 1010   HCT 28.6 (L) 12/12/2018 0555   HCT 39.3 11/21/2014 1010     Per Protocol for Patient with estCrcl > 30 ml/min and BMI < 40, will transition to Lovenox 40 mg Q24h.

## 2018-12-16 NOTE — Plan of Care (Signed)

## 2018-12-17 ENCOUNTER — Ambulatory Visit: Admission: RE | Admit: 2018-12-17 | Payer: Medicare Other | Source: Ambulatory Visit

## 2018-12-17 ENCOUNTER — Telehealth: Payer: Self-pay | Admitting: Family Medicine

## 2018-12-17 NOTE — Progress Notes (Signed)
[place] [facility] [place] NAME: Collin Gonzales    MR#:  [id number]  DATE OF BIRTH:  06-20-1935  SUBJECTIVE:   Patient denies any concerns this morning.  He states he is having a lot of pain in his genital area, but the pain meds are helping.  He denies any nausea or vomiting.  REVIEW OF SYSTEMS:  CONSTITUTIONAL: No fever, has fatigue and weakness.  EYES: No blurred or double vision.  EARS, NOSE, AND THROAT: No tinnitus or ear pain.  RESPIRATORY: No cough, shortness of breath, wheezing or hemoptysis.  CARDIOVASCULAR: No chest pain, orthopnea, edema.  GASTROINTESTINAL: No nausea, vomiting, diarrhea or abdominal pain.  GENITOURINARY: Reporting pain and swelling in the penile area with some discharge SKIN: No rash or lesion. MUSCULOSKELETAL: No joint pain or arthritis.   NEUROLOGIC: No tingling, numbness, weakness.  PSYCHIATRY: No anxiety or depression.   DRUG ALLERGIES:   Allergies  Allergen Reactions  . Plaquenil [Hydroxychloroquine] Other (See Comments)    Bad dreams  . Simvastatin Other (See Comments)    Bad dreams    VITALS:  Blood pressure (!) 92/54, pulse 82, temperature 98 F (36.7 C), temperature source Oral, resp. rate 19, height 5\' 7"  (1.702 m), weight 46.3 kg, SpO2 95 %.  PHYSICAL EXAMINATION:  GENERAL:  83 y.o.-year-old patient lying in the bed with no acute distress.  EYES: Pupils equal, round, reactive to light and accommodation. No scleral icterus. Extraocular muscles intact.  HEENT: Head atraumatic, normocephalic. Oropharynx and nasopharynx clear.  NECK:  Supple, no jugular venous distention. No thyroid enlargement, no tenderness.  LUNGS: Normal breath sounds bilaterally, no wheezing, rales,rhonchi or crepitation. No use of accessory muscles of respiration.  CARDIOVASCULAR: RRR, S1, S2 normal. No murmurs, rubs, or gallops.  ABDOMEN: Soft, nontender, nondistended. Bowel sounds present.  GENITOURINARY- penis is  tender and edematous with ulcerations present EXTREMITIES: No pedal edema, cyanosis, or clubbing.  NEUROLOGIC: Cranial nerves II through XII are intact. Muscle strength global weakness in all extremities. Sensation intact. Gait not checked.  PSYCHIATRIC: The patient is alert and oriented x 3.  SKIN: No rash  LABORATORY PANEL:   CBC Recent Labs  Lab 12/12/18 0555  WBC 8.1  HGB 8.6*  HCT 28.6*  PLT 273   ------------------------------------------------------------------------------------------------------------------  Chemistries  Recent Labs  Lab 12/11/18 1840 12/12/18 0555  NA 141 141  K 4.5 4.7  CL 107 109  CO2 26 25  GLUCOSE 122* 96  BUN 42* 41*  CREATININE 1.35* 1.16  CALCIUM 8.9 8.8*  AST 33  --   ALT 13  --   ALKPHOS 49  --   BILITOT 0.4  --    ------------------------------------------------------------------------------------------------------------------  Cardiac Enzymes No results for input(s): TROPONINI in the last 168 hours. ------------------------------------------------------------------------------------------------------------------  RADIOLOGY:  No results found.  EKG:   Orders placed or performed during the hospital encounter of 10/26/18  . ED EKG  . ED EKG    ASSESSMENT AND PLAN:   Sepsis- meeting sepsis criteria on admission with fever and tachypnea.  Sepsis has been ruled out with negative cultures. -Blood cultures with no growth -Urine culture with multiple species -Now on Augmentin, plan to treat for a total 10 days  Balanoposthitis with locally advanced high-grade urothelial carcinoma -Not a surgical candidate -Palliative care following  Healthcare associated pneumonia-improved -Continue Augmentin for a total 10-day course of antibiotics  Rheumatoid arthritis -Continue home medication methotrexate  Chronic respiratory failure secondary to COPD- patient is stable on  his home 2 L O2. -Continue home inhalers  Patient is  medically cleared for discharge to SNF.  Awaiting bed placement.  Daughter, Collin Gonzales, updated via the phone.  All the records are reviewed and case discussed with Care Management/Social Workerr.  patient, daughter Collin Gonzales [phone] is aware of the plan  CODE STATUS: DNR daughter Collin Gonzales is healthcare power of attorney  TOTAL TIME TAKING CARE OF THIS PATIENT: 35 minutes.   POSSIBLE D/C IN 1-2 DAYS, DEPENDING ON CLINICAL CONDITION.  Note: This dictation was prepared with Dragon dictation along with smaller phrase technology. Any transcriptional errors that result from this process are unintentional.   Evette Doffing M.D on 12/17/2018 at 1:22 PM  Between 7am to 6pm - Pager - [phone]  After 6pm go to [email or web address] - password [email or web address] [facility]  [facility]  Office  [phone]  CC: Primary care physician; [name], MD

## 2018-12-17 NOTE — Chronic Care Management (AMB) (Signed)
°  Chronic Care Management   Outreach Note  12/17/2018 Name: Collin Gonzales MRN: [id number] DOB: Mar 31, 1935  Referred by: [name], MD Reason for referral : Chronic Care Management (Initial CCM outreach was unsuccessful. )   An unsuccessful telephone outreach was attempted today. The patient was referred to the case management team by for assistance with care management and care coordination.   Follow Up Plan: A HIPPA compliant phone message was left for the patient providing contact information and requesting a return call.  The care management team will reach out to the patient again over the next 7 days.  If patient returns call to provider office, please advise to call [place] [facility] [place]  [email or web address]   ??[phone]

## 2018-12-17 NOTE — Care Management Important Message (Signed)
Important Message  Patient Details  Name: Collin Gonzales MRN: [id number] Date of Birth: 05-Jun-1935   Medicare Important Message Given:  Yes     Juliann Pulse A Doratha Mcswain 12/17/2018, 12:12 PM

## 2018-12-18 ENCOUNTER — Telehealth: Payer: Self-pay | Admitting: *Deleted

## 2018-12-18 DIAGNOSIS — C679 Malignant neoplasm of bladder, unspecified: Secondary | ICD-10-CM | POA: Diagnosis not present

## 2018-12-18 DIAGNOSIS — G893 Neoplasm related pain (acute) (chronic): Secondary | ICD-10-CM | POA: Diagnosis not present

## 2018-12-18 DIAGNOSIS — Z87891 Personal history of nicotine dependence: Secondary | ICD-10-CM | POA: Diagnosis not present

## 2018-12-18 DIAGNOSIS — Z515 Encounter for palliative care: Secondary | ICD-10-CM | POA: Diagnosis not present

## 2018-12-18 LAB — CREATININE, SERUM
Creatinine, Ser: 0.95 mg/dL (ref 0.61–1.24)
GFR calc Af Amer: >60 mL/min (ref >60)
GFR calc non Af Amer: >60 mL/min (ref >60)

## 2018-12-18 MED ORDER — OXYCODONE HCL ER 10 MG PO T12A
10.0000 mg | EXTENDED_RELEASE_TABLET | Freq: Two times a day (BID) | ORAL | Status: DC
  Administered 2018-12-18 – 2018-12-20 (×5): 10 mg via ORAL
  Filled 2018-12-18 (×5): qty 1

## 2018-12-18 NOTE — Progress Notes (Signed)
[place]  Telephone:([phone] [phone]   Name: Collin Gonzales Date: 12/18/2018 MRN: [id number]  DOB: February 06, 1936  Patient Care Team: [name], MD as PCP - General (Family Medicine) [name], MD as Referring Physician (Ophthalmology) [name], MD as Consulting Physician (Rheumatology) [name], MD as Referring Physician (Specialist) [name], MD as Consulting Physician (Urology) [name], MD as Referring Physician (Radiation Oncology)    REASON FOR CONSULTATION: Palliative Care consult requested for [phone] y.o.malewith multiple medical problems including locally advanced high-grade urothelial carcinoma. PMH also notable for O2 dependent COPD, PET positive lung mass status post SBRT, prostate cancer status post brachii therapy seed implant, history of CVA. Patient is felt not to be a surgical candidate for his urothelial carcinoma and treatment options are felt to be somewhat limited based on frailty.   He has been receiving Tecentriq.  Patient has had worsening penile pain and was evaluated by urology on 12/06/2018 and diagnosed with balanoposthitis.  He was subsequently admitted to the hospital on 12/11/2018 with worsening groin pain.  He was admitted with possible sepsis from UTI and pneumonia.  He was referred to palliative care to help address goals and manage ongoing symptoms.   CODE STATUS: DNR  PAST MEDICAL HISTORY: Past Medical History:  Diagnosis Date   Arthritis    Collagen vascular disease (HCC)    rheumatoid arthritis   COPD (chronic obstructive pulmonary disease) (HCC)    GERD (gastroesophageal reflux disease)    History of prostate cancer    HLD (hyperlipidemia)    HOH (hard of hearing)    Insomnia    Oxygen deficit    2L HS AND PRN   Primary cancer of right lung ([place]) 2017   rad tx's.   Prostate cancer ([place]) 2006   Rad seed  tx's.   Stroke [facility])    2000   Urothelial carcinoma of bladder ([place]) 10/2017   TURP to Prostate and chemo tx's.    PAST SURGICAL HISTORY:  Past Surgical History:  Procedure Laterality Date   APPENDECTOMY  1965   CATARACT EXTRACTION W/PHACO Right 01/05/2016   Procedure: CATARACT EXTRACTION PHACO AND INTRAOCULAR LENS PLACEMENT ([place]);  Surgeon: [name], MD;  Location: [facility] ORS;  Service: Ophthalmology;  Laterality: Right;  [place] 1.14 AP% 18.1 CDE 13.44 FLUID PACK LOT # [id number] H   CYSTOSCOPY W/ RETROGRADES Bilateral 11/01/2017   Procedure: CYSTOSCOPY WITH RETROGRADE PYELOGRAM;  Surgeon: Hollice Espy, MD;  Location: [facility] ORS;  Service: Urology;  Laterality: Bilateral;   ENDOBRONCHIAL ULTRASOUND N/A 02/26/2015   Procedure: ENDOBRONCHIAL ULTRASOUND;  Surgeon: Flora Lipps, MD;  Location: [facility] ORS;  Service: Cardiopulmonary;  Laterality: N/A;   [place] RADIATION SEED  2002   PORTA CATH INSERTION N/A 05/14/2018   Procedure: PORTA CATH INSERTION;  Surgeon: Algernon Huxley, MD;  Location: [facility] CV LAB;  Service: Cardiovascular;  Laterality: N/A;   TRANSURETHRAL RESECTION OF PROSTATE N/A 11/01/2017   Procedure: TRANSURETHRAL RESECTION OF THE PROSTATE (TURP) (channel TURP);  Surgeon: Hollice Espy, MD;  Location: [facility] ORS;  Service: Urology;  Laterality: N/A;    HEMATOLOGY/ONCOLOGY HISTORY:  Oncology History  Urothelial carcinoma of bladder ([place])  11/12/2017 Initial Diagnosis   Urothelial carcinoma of bladder ([place])   11/23/2017 -  Chemotherapy   The patient had atezolizumab (TECENTRIQ) 1,200 mg in sodium chloride 0.9 % 250 mL chemo infusion, 1,200 mg, Intravenous, Once, 17 of 20  cycles Administration: 1,200 mg (11/23/2017), 1,200 mg (12/14/2017), 1,200 mg (01/04/2018), 1,200 mg (01/25/2018), 1,200 mg (03/22/2018), 1,200 mg (02/28/2018), 1,200 mg (04/12/2018), 1,200 mg (05/30/2018), 1,200 mg (06/20/2018), 1,200 mg (07/11/2018), 1,200 mg (08/01/2018),  1,200 mg (08/22/2018), 1,200 mg (09/13/2018), 1,200 mg (10/04/2018), 1,200 mg (10/25/2018), 1,200 mg (11/15/2018), 1,200 mg (12/06/2018)  for chemotherapy treatment.      ALLERGIES:  is allergic to plaquenil [hydroxychloroquine] and simvastatin.  MEDICATIONS:  Current Facility-Administered Medications  Medication Dose Route Frequency Provider Last Rate Last Dose   acetaminophen (TYLENOL) tablet 650 mg  650 mg Oral Q6H PRN Lance Coon, MD   650 mg at 12/16/18 0007   Or   acetaminophen (TYLENOL) suppository 650 mg  650 mg Rectal Q6H PRN Lance Coon, MD       amoxicillin-clavulanate (AUGMENTIN) 875-125 MG per tablet 1 tablet  1 tablet Oral Q12H Saundra Shelling, MD   1 tablet at 12/18/18 0915   Chlorhexidine Gluconate Cloth 2 % PADS 6 each  6 each Topical Daily Saundra Shelling, MD   6 each at 12/18/18 0917   docusate sodium (COLACE) capsule 100 mg  100 mg Oral BID Saundra Shelling, MD   100 mg at 12/18/18 0915   enoxaparin (LOVENOX) injection 40 mg  40 mg Subcutaneous Q24H Lance Coon, MD   40 mg at 12/18/18 0914   feeding supplement (ENSURE ENLIVE) (ENSURE ENLIVE) liquid 237 mL  237 mL Oral BID BM Gouru, Aruna, MD   237 mL at 12/18/18 0917   fluticasone furoate-vilanterol (BREO ELLIPTA) 100-25 MCG/INH 1 puff  1 puff Inhalation Daily Lance Coon, MD   1 puff at 12/18/18 0914   And   umeclidinium bromide (INCRUSE ELLIPTA) 62.5 MCG/INH 1 puff  1 puff Inhalation Daily Lance Coon, MD   1 puff at 12/18/18 9628   Gerhardt's butt cream   Topical TID Nicholes Mango, MD       megestrol (MEGACE) tablet 20 mg  20 mg Oral BID Lance Coon, MD   20 mg at 12/18/18 0915   methotrexate (RHEUMATREX) tablet 10 mg  10 mg Oral Q Thu Nazari, Walid A, RPH   10 mg at 12/13/18 1945   morphine 2 MG/ML injection 2 mg  2 mg Intravenous Q4H PRN Gouru, Illene Silver, MD   2 mg at 12/18/18 0913   ondansetron (ZOFRAN) tablet 4 mg  4 mg Oral Q6H PRN Lance Coon, MD       Or   ondansetron [facility]) injection 4 mg  4 mg  Intravenous Q6H PRN Lance Coon, MD       oxyCODONE (Oxy IR/ROXICODONE) immediate release tablet 10 mg  10 mg Oral Q4H PRN Gouru, Aruna, MD   10 mg at 12/17/18 0955   oxyCODONE (OXYCONTIN) 12 hr tablet 10 mg  10 mg Oral Q12H Betty Brooks, Kirt Boys, NP       polyethylene glycol (MIRALAX / GLYCOLAX) packet 17 g  17 g Oral Daily PRN Saundra Shelling, MD   17 g at 12/18/18 0913   QUEtiapine (SEROQUEL) tablet 50 mg  50 mg Oral Corwin Levins, MD   50 mg at 12/17/18 2240   sodium chloride flush (NS) 0.9 % injection 10-40 mL  10-40 mL Intracatheter PRN Hollice Espy, MD   10 mL at 12/18/18 0914    VITAL SIGNS: BP 95/63 (BP Location: Right Arm)    Pulse 93    Temp 98 F (36.7 C) (Oral)    Resp 17    Ht 5\' 7"  (1.702 m)  Wt 102 lb (46.3 kg)    SpO2 92%    BMI 15.98 kg/m  Filed Weights   12/11/18 1824  Weight: 102 lb (46.3 kg)    Estimated body mass index is 15.98 kg/m as calculated from the following:   Height as of this encounter: 5\' 7"  (1.702 m).   Weight as of this encounter: 102 lb (46.3 kg).  LABS: CBC:    Component Value Date/Time   WBC 8.1 12/12/2018 0555   HGB 8.6 (L) 12/12/2018 0555   HGB 12.3 (L) 11/21/2014 1010   HCT 28.6 (L) 12/12/2018 0555   HCT 39.3 11/21/2014 1010   PLT 273 12/12/2018 0555   PLT 387 (H) 11/21/2014 1010   MCV 93.2 12/12/2018 0555   MCV 95 11/21/2014 1010   MCV 95 01/05/2014 0429   NEUTROABS 7.9 (H) 12/11/2018 1840   NEUTROABS 3.2 11/21/2014 1010   NEUTROABS 5.3 01/05/2014 0429   LYMPHSABS 0.9 12/11/2018 1840   LYMPHSABS 1.7 11/21/2014 1010   LYMPHSABS 0.4 (L) 01/05/2014 0429   MONOABS 0.3 12/11/2018 1840   MONOABS 0.2 01/05/2014 0429   EOSABS 0.1 12/11/2018 1840   EOSABS 0.1 11/21/2014 1010   EOSABS 0.0 01/05/2014 0429   BASOSABS 0.0 12/11/2018 1840   BASOSABS 0.0 11/21/2014 1010   BASOSABS 0.0 01/05/2014 0429   Comprehensive Metabolic Panel:    Component Value Date/Time   NA 141 12/12/2018 0555   NA 139 12/08/2014 1004   NA 138  01/04/2014 0430   K 4.7 12/12/2018 0555   K 4.4 01/04/2014 0430   CL 109 12/12/2018 0555   CL 102 01/04/2014 0430   CO2 25 12/12/2018 0555   CO2 30 01/04/2014 0430   BUN 41 (H) 12/12/2018 0555   BUN 15 12/08/2014 1004   BUN 17 01/04/2014 0430   CREATININE 0.95 12/18/2018 0542   CREATININE 1.17 10/11/2017 1239   GLUCOSE 96 12/12/2018 0555   GLUCOSE 121 (H) 01/04/2014 0430   CALCIUM 8.8 (L) 12/12/2018 0555   CALCIUM 8.4 (L) 01/04/2014 0430   AST 33 12/11/2018 1840   ALT 13 12/11/2018 1840   ALKPHOS 49 12/11/2018 1840   BILITOT 0.4 12/11/2018 1840   BILITOT 0.3 12/08/2014 1004   PROT 8.0 12/11/2018 1840   PROT 8.6 (H) 12/08/2014 1004   ALBUMIN 2.4 (L) 12/11/2018 1840   ALBUMIN 3.7 12/08/2014 1004    RADIOGRAPHIC STUDIES: Dg Chest Port 1 View  Result Date: 12/11/2018 CLINICAL DATA:  Fever EXAM: PORTABLE CHEST 1 VIEW COMPARISON:  October 28, 2018 FINDINGS: There is underlying emphysematous change with fibrosis throughout the lower lobes. There is patchy infiltrate superimposed on fibrosis in the right base. The heart size is normal. The pulmonary vascularity reflects the underlying emphysematous change. No adenopathy. Port-A-Cath tip is in the superior vena cava. There is a skin fold on the left but no evident pneumothorax. No bone lesions. IMPRESSION: Airspace opacity consistent with pneumonia in the right base, superimposed on emphysematous change in fibrosis. Heart size within normal limits. Stable pulmonary vascularity. No adenopathy evident. Port-A-Cath tip in superior vena cava.  Skin fold on left. Followup PA and lateral chest radiographs recommended in 3-4 weeks following trial of antibiotic therapy to ensure resolution and exclude underlying malignancy. Electronically Signed   By: Lowella Grip III M.D.   On: 12/11/2018 19:55    PERFORMANCE STATUS (ECOG) : 3 - Symptomatic, >50% confined to bed  Review of Systems Unless otherwise noted, a complete review of systems is  negative.  Physical  Exam General: NAD, frail appearing, thin Pulmonary: Unlabored Extremities: no edema, no joint deformities Skin: Penile wound noted but not visualized Neurological: Weakness but otherwise nonfocal  IMPRESSION: Patient reports persistent pain over the past day or two.  He has been receiving sporadic dosing of morphine and oxycodone and reports that pain medications help but are short-lived.  He is in agreement with trial of a long-acting opioid.  We will start low-dose OxyContin.  Plan is for rehab after hospitalization.  Awaiting bed placement.  We will have patient followed by palliative care at [facility].  PLAN: -Continue current scope of treatment -Start OxyContin 10 mg every 12 hours -Continue oxycodone IR 10 mg every 4 hours as needed for breakthrough pain -Referral for community-based palliative care to follow at SNF -We will also plan outpatient follow-up in the palliative care clinic   Time Total: 15 minutes  Visit consisted of counseling and education dealing with the complex and emotionally intense issues of symptom management and palliative care in the setting of serious and potentially life-threatening illness.Greater than 50%  of this time was spent counseling and coordinating care related to the above assessment and plan.  Signed by: Altha Harm, PhD, NP-C [phone] (Work Cell)

## 2018-12-18 NOTE — TOC Progression Note (Signed)
Transition of Care [facility]) - Progression Note    Patient Details  Name: Collin Gonzales MRN: [id number] Date of Birth: 01-25-1936  Transition of [facility]) CM/SW Contact  Shelbie Hutching, RN Phone Number: 12/18/2018, 2:51 PM  Clinical Narrative:    Patient's daughter [name] would like for [facility] to check with [employer].  [name] went down to DSS this morning and filled out the application for Medicaid for her father.  After rehab the patient will need long term care.    Expected Discharge Plan: [place] Barriers to Discharge: Continued Medical Work up  Expected Discharge Plan and Services Expected Discharge Plan: [place] Choice: [place] arrangements for the past 2 months: [place]: RN, Nurse's Aide [place] Agency: [place] ([facility]) Date [place]: 12/12/18   Representative spoke with at [place]: Floydene Flock   Social Determinants of Health (SDOH) Interventions    Readmission Risk Interventions Readmission Risk Prevention Plan 12/12/2018 10/28/2018  Transportation Screening Complete Complete  PCP or Specialist Appt within 3-5 Days Complete -  HRI or Home Care Consult Complete -  Social Work Consult for [place] Planning/Counseling Complete Complete  Palliative Care Screening Complete Not Applicable  Medication Review Press photographer) Complete Complete  Some recent data might be hidden

## 2018-12-18 NOTE — Telephone Encounter (Signed)
All home health orders have been signed by PCP.

## 2018-12-18 NOTE — TOC Progression Note (Signed)
Transition of Care [facility]) - Progression Note    Patient Details  Name: Collin Gonzales MRN: [id number] Date of Birth: 04-28-1935  Transition of Care [facility]) CM/SW Contact  Shelbie Hutching, RN Phone Number: 12/18/2018, 2:41 PM  Clinical Narrative:    RNCM spoke with patient's daughter via phone.  Daughter expresses that she and the patient would like for rehab to be at [facility] in [place].  [place] has made a bed offer but the patient is in his copay days.  RNCM explained to patient's daughter that patient will have to pay a copay for every day in SNF during this time- Co pay is $168/ day.  RNCM will reach out to [facility]. Daughter reports that she is going to contact DSS about long term Medicaid, the patient has expressed that he will need long term care and will not be able to pay out of pocket.   RNCM will follow up with patient's daughter tomorrow.    Expected Discharge Plan: [place] Barriers to Discharge: Continued Medical Work up  Expected Discharge Plan and Services Expected Discharge Plan: [place] Choice: [place] arrangements for the past 2 months: [place]: RN, Nurse's Aide [place] Agency: [place] ([facility]) Date [place]: 12/12/18   Representative spoke with at [place]: Floydene Flock   Social Determinants of Health (SDOH) Interventions    Readmission Risk Interventions Readmission Risk Prevention Plan 12/12/2018 10/28/2018  Transportation Screening Complete Complete  PCP or Specialist Appt within 3-5 Days Complete -  HRI or Home Care Consult Complete -  Social Work Consult for [place] Planning/Counseling Complete Complete  Palliative Care Screening Complete Not Applicable  Medication Review Press photographer) Complete Complete  Some recent data might be hidden

## 2018-12-18 NOTE — Progress Notes (Signed)
Physical Therapy Treatment Patient Details Name: Collin Gonzales MRN: [id number] DOB: 08-28-1935 Today's Date: 12/18/2018    History of Present Illness 83 y.o. male who presents with chief complaint of worsening penile pain.  He states that he was recently seen by urology and was diagnosed with balanitis, and given nystatin cream.  He says that he has been applying this cream, but that he has not had improvement in his symptoms.      PT Comments    Pt presented with deficits in strength, transfers, mobility, gait, balance, and activity tolerance. Pt's pain was 5/10 at rest, and increased to 8/10 with functional movement. Pt required mod A to manage BLE to rise to EOB 2/2 pain. Pt sat at EOB with heavy, kyphotic lean into RW for support, despite cuing to support self on mattress. Pt completed sit<>stand transfer with min A with fair concentric and eccentric control, but experienced stress incontinence upon fully standing, and was returned to bed. Vitals were monitored throughout and were [facility]. Pt will benefit from PT services in a SNF setting upon discharge to safely address above deficits for decreased caregiver assistance and eventual return to PLOF.    Follow Up Recommendations  SNF;Supervision/Assistance - 24 hour     Equipment Recommendations       Recommendations for Other Services       Precautions / Restrictions Precautions Precautions: Fall Restrictions Weight Bearing Restrictions: No    Mobility  Bed Mobility Overal bed mobility: Needs Assistance Bed Mobility: Supine to Sit;Sit to Supine     Supine to sit: Mod assist Sit to supine: Mod assist   General bed mobility comments: Pt required mod A to and from EOB using chuck pad to mobilize.  Transfers Overall transfer level: Needs assistance Equipment used: Rolling walker (2 wheeled) Transfers: Sit to/from Stand Sit to Stand: Min assist         General transfer comment: Pt was able to rise to standing with  cues for set up/sequencing and min a, pt clearly uncomfortable and highly guarded, and experienced stress incontenience once fully standing.  Ambulation/Gait             General Gait Details: N/A   Stairs             Wheelchair Mobility    Modified Rankin (Stroke Patients Only)       Balance Overall balance assessment: Needs assistance Sitting-balance support: Bilateral upper extremity supported;Feet supported Sitting balance-Leahy Scale: Poor Sitting balance - Comments: Pt struggles to tolerate sitting position 2/2 penile pain     Standing balance-Leahy Scale: Poor Standing balance comment: Heavy lean into RW, poor BLE placement awareness.                            Cognition Arousal/Alertness: Awake/alert Behavior During Therapy: Anxious Overall Cognitive Status: Difficult to assess                                        Exercises Total Joint Exercises Ankle Circles/Pumps: AROM;Strengthening;Both;10 reps Quad Sets: AROM;Strengthening;Both;10 reps Heel Slides: AROM;Strengthening;Both;10 reps Hip ABduction/ADduction: AROM;Right;Both;5 reps Straight Leg Raises: AROM;Strengthening;Both;5 reps Long Arc Quad: AROM;Strengthening;Both;5 reps Bridges: Strengthening;5 reps;Both Other Exercises Other Exercises: Rolling from left to right with cuing for good coordinated movement of BUE and BLE.    General Comments        Pertinent  Vitals/Pain Pain Score: 8  Pain Location: Penis    Home Living                      Prior Function            PT Goals (current goals can now be found in the care plan section) Progress towards PT goals: Not progressing toward goals - comment(Functional mobility limited 2/2 pain)    Frequency    Min 2X/week      PT Plan Current plan remains appropriate    Co-evaluation              AM-PAC PT "6 Clicks" Mobility   Outcome Measure  Help needed turning from your back to  your side while in a flat bed without using bedrails?: A Lot Help needed moving from lying on your back to sitting on the side of a flat bed without using bedrails?: A Little Help needed moving to and from a bed to a chair (including a wheelchair)?: A Lot Help needed standing up from a chair using your arms (e.g., wheelchair or bedside chair)?: A Lot Help needed to walk in hospital room?: A Lot Help needed climbing 3-5 steps with a railing? : A Lot 6 Click Score: 13    End of Session Equipment Utilized During Treatment: Gait belt;Oxygen Activity Tolerance: Patient limited by pain;Other (comment)(Stress incontinence) Patient left: in bed;with call bell/phone within reach;with bed alarm set Nurse Communication: Mobility status PT Visit Diagnosis: Difficulty in walking, not elsewhere classified (R26.2);Muscle weakness (generalized) (M62.81)     Time: 0981-1914 PT Time Calculation (min) (ACUTE ONLY): 44 min  Charges:                        Juanda Crumble "Gus" Jeramine Delis, SPT  12/18/18, 1:29 PM

## 2018-12-18 NOTE — TOC Progression Note (Addendum)
Transition of Care [facility]) - Progression Note    Patient Details  Name: Collin Gonzales MRN: [id number] Date of Birth: 04-12-1935  Transition of Care [facility]) CM/SW Contact  Shelbie Hutching, RN Phone Number: 12/18/2018, 3:35 PM  Clinical Narrative:    [name] has accepted the patient for rehab.  [facility] authorization started.  Patient does not have to pay copay upfront to be admitted.  RNCM left a message with [name] for return call.     Expected Discharge Plan: [place] Barriers to Discharge: Continued Medical Work up  Expected Discharge Plan and Services Expected Discharge Plan: [place] Choice: [place] arrangements for the past 2 months: [place]: RN, Nurse's Aide [place] Agency: [place] ([facility]) Date [place]: 12/12/18   Representative spoke with at [place]: Floydene Flock   Social Determinants of Health (SDOH) Interventions    Readmission Risk Interventions Readmission Risk Prevention Plan 12/12/2018 10/28/2018  Transportation Screening Complete Complete  PCP or Specialist Appt within 3-5 Days Complete -  HRI or Home Care Consult Complete -  Social Work Consult for [place] Planning/Counseling Complete Complete  Palliative Care Screening Complete Not Applicable  Medication Review Press photographer) Complete Complete  Some recent data might be hidden

## 2018-12-18 NOTE — Progress Notes (Signed)
Nutrition Follow Up Note   DOCUMENTATION CODES:   Severe malnutrition in context of chronic illness, Underweight  INTERVENTION:   Provide Ensure Enlive po BID, each supplement provides 350 kcal and 20 grams of protein.  Provide Magic cup TID with meals, each supplement provides 290 kcal and 9 grams of protein.  NUTRITION DIAGNOSIS:   Severe Malnutrition related to chronic illness(COPD, urothelial carcinoma of bladder) as evidenced by severe fat depletion, severe muscle depletion.  GOAL:   Patient will meet greater than or equal to 90% of their needs  -progressing   MONITOR:   PO intake, Supplement acceptance, Labs, Weight trends, Skin, I & O's  ASSESSMENT:   83 year old male with PMHx of GERD, COPD, prostate cancer, hx CVA, HLD, arthritis, lung cancer s/p XRT, urothelial carcinoma of bladder admitted with sepsis, balanoposthitis, PNA, AKI.   Pt continues to have good appetite and oral intake; pt eating 20-100% of meals and drinking Ensure. No new weight since admit; will request weekly weights. Pt awaiting SNF bed. Palliative care following.   Medications reviewed and include: augmentin, colace, lovenox, megace  Labs reviewed: BUN 41(H) Hgb 8.6(L), Hct 28.6(L)  Diet Order:   Diet Order            Diet regular Room service appropriate? Yes with Assist; Fluid consistency: Thin  Diet effective now             EDUCATION NEEDS:   No education needs have been identified at this time  Skin:  Skin Assessment: Skin Integrity Issues:(MSAD to perineum, groin, scrotum, penis)  Last BM:  10/26- type 6  Height:   Ht Readings from Last 1 Encounters:  12/11/18 5\' 7"  (1.702 m)   Weight:   Wt Readings from Last 1 Encounters:  12/11/18 46.3 kg   Ideal Body Weight:  67.3 kg  BMI:  Body mass index is 15.98 kg/m.  Estimated Nutritional Needs:   Kcal:  1500-1700  Protein:  75-85 grams  Fluid:  1.5-1.7 L/day  Koleen Distance MS, RD, LDN Pager #-  [phone] Office#- [phone] After Hours Pager: [phone]

## 2018-12-18 NOTE — Progress Notes (Signed)
New referral for [facility] community Palliative program to follow post discharge received from Palliative NP [place]. CMRN Doran Clay made aware. Plan is for discharge to SNF. Flo Shanks BSN, RN, [place] [phone]

## 2018-12-18 NOTE — Telephone Encounter (Signed)
Collin Gonzales called wanting the oxygen picked up from her home. It looks like he will be going to SNF on discharge from hospital

## 2018-12-18 NOTE — Telephone Encounter (Signed)
That's fine.  Did we order the home health and what is his agency?

## 2018-12-18 NOTE — Progress Notes (Signed)
[place] at [place] NAME: Collin Gonzales    MR#:  [id number]  DATE OF BIRTH:  1935/12/13  SUBJECTIVE:   Patient states he is having a lot of pain today. He is asking for pain medicines. He has no other concerns today. He states he is ready to leave the hospital.  REVIEW OF SYSTEMS:  CONSTITUTIONAL: No fever, has fatigue and weakness.  EYES: No blurred or double vision.  EARS, NOSE, AND THROAT: No tinnitus or ear pain.  RESPIRATORY: No cough, shortness of breath, wheezing or hemoptysis.  CARDIOVASCULAR: No chest pain, orthopnea, edema.  GASTROINTESTINAL: No nausea, vomiting, diarrhea or abdominal pain.  GENITOURINARY: +genital pain SKIN: No rash or lesion. MUSCULOSKELETAL: No joint pain or arthritis.   NEUROLOGIC: No tingling, numbness, weakness.  PSYCHIATRY: No anxiety or depression.   DRUG ALLERGIES:   Allergies  Allergen Reactions  . Plaquenil [Hydroxychloroquine] Other (See Comments)    Bad dreams  . Simvastatin Other (See Comments)    Bad dreams    VITALS:  Blood pressure 95/63, pulse 93, temperature 98 F (36.7 C), temperature source Oral, resp. rate 17, height 5\' 7"  (1.702 m), weight 46.3 kg, SpO2 92 %.  PHYSICAL EXAMINATION:  GENERAL:  83 y.o.-year-old patient lying in the bed with no acute distress.  EYES: Pupils equal, round, reactive to light and accommodation. No scleral icterus. Extraocular muscles intact.  HEENT: Head atraumatic, normocephalic. Oropharynx and nasopharynx clear.  NECK:  Supple, no jugular venous distention. No thyroid enlargement, no tenderness.  LUNGS: Normal breath sounds bilaterally, no wheezing, rales,rhonchi or crepitation. No use of accessory muscles of respiration.  CARDIOVASCULAR: RRR, S1, S2 normal. No murmurs, rubs, or gallops.  ABDOMEN: Soft, nontender, nondistended. Bowel sounds present.  GENITOURINARY: penis is tender and edematous with ulcerations present EXTREMITIES: No pedal  edema, cyanosis, or clubbing.  NEUROLOGIC: Cranial nerves II through XII are intact. Muscle strength global weakness in all extremities. Sensation intact. Gait not checked.  PSYCHIATRIC: The patient is alert and oriented x 3.  SKIN: No rash  LABORATORY PANEL:   CBC Recent Labs  Lab 12/12/18 0555  WBC 8.1  HGB 8.6*  HCT 28.6*  PLT 273   ------------------------------------------------------------------------------------------------------------------  Chemistries  Recent Labs  Lab 12/11/18 1840 12/12/18 0555 12/18/18 0542  NA 141 141  --   K 4.5 4.7  --   CL 107 109  --   CO2 26 25  --   GLUCOSE 122* 96  --   BUN 42* 41*  --   CREATININE 1.35* 1.16 0.95  CALCIUM 8.9 8.8*  --   AST 33  --   --   ALT 13  --   --   ALKPHOS 49  --   --   BILITOT 0.4  --   --    ------------------------------------------------------------------------------------------------------------------  Cardiac Enzymes No results for input(s): TROPONINI in the last 168 hours. ------------------------------------------------------------------------------------------------------------------  RADIOLOGY:  No results found.  EKG:   Orders placed or performed during the hospital encounter of 10/26/18  . ED EKG  . ED EKG    ASSESSMENT AND PLAN:   Balanoposthitis with locally advanced high-grade urothelial carcinoma -Not a surgical candidate -Pain control -Palliative care following  Healthcare associated pneumonia-improved.  Initially meeting sepsis criteria on admission with fever and tachypnea, but sepsis was ruled out with negative cultures -Continue Augmentin for a total 10-day course of antibiotics  Rheumatoid arthritis -Continue home medication methotrexate  Chronic respiratory failure secondary to COPD-  patient is stable on his home 2 L O2. -Continue home inhalers  Patient is medically cleared for discharge to SNF.  Awaiting bed placement. Received two bed offers for facilities in  [facility], but patient's daughter felt that this was too far away.  Daughter, [name], updated via the phone.  All the records are reviewed and case discussed with Care Management/Social Workerr.  patient.  CODE STATUS: DNR daughter [name] is healthcare power of attorney  TOTAL TIME TAKING CARE OF THIS PATIENT: 35 minutes.   POSSIBLE D/C IN 1-2 DAYS, DEPENDING ON CLINICAL CONDITION.  Note: This dictation was prepared with Dragon dictation along with smaller phrase technology. Any transcriptional errors that result from this process are unintentional.   Evette Doffing M.D on 12/18/2018 at 12:25 PM  Between 7am to 6pm - Pager - [phone]  After 6pm go to [email or web address] - password [email or web address] [facility]  [place] [facility]  Office  [phone]  CC: Primary care physician; [name], MD

## 2018-12-19 LAB — SARS CORONAVIRUS 2 (TAT 6-24 HRS): SARS Coronavirus 2: NEGATIVE

## 2018-12-19 MED ORDER — SENNOSIDES-DOCUSATE SODIUM 8.6-50 MG PO TABS
2.0000 | ORAL_TABLET | Freq: Two times a day (BID) | ORAL | Status: DC
  Administered 2018-12-19 – 2018-12-20 (×3): 2 via ORAL
  Filled 2018-12-19 (×3): qty 2

## 2018-12-19 NOTE — Plan of Care (Signed)
Patient with general understanding of care

## 2018-12-19 NOTE — TOC Progression Note (Signed)
Transition of Care [facility]) - Progression Note    Patient Details  Name: BRICK KETCHER MRN: [id number] Date of Birth: 04-Jul-1935  Transition of Care [facility]) CM/SW Contact  Shelbie Hutching, RN Phone Number: 12/19/2018, 4:10 PM  Clinical Narrative:    Daughter has been updated on plan of care, patient will discharge tomorrow to [facility].  Authorization approved from [facility]     Expected Discharge Plan: [place] Barriers to Discharge: Continued Medical Work up  Expected Discharge Plan and Services Expected Discharge Plan: [place] Choice: [place] arrangements for the past 2 months: [place]: RN, Nurse's Aide [place] Agency: [place] ([facility]) Date [employer]: 12/12/18   Representative spoke with at [place]: Floydene Flock   Social Determinants of Health (SDOH) Interventions    Readmission Risk Interventions Readmission Risk Prevention Plan 12/12/2018 10/28/2018  Transportation Screening Complete Complete  PCP or Specialist Appt within 3-5 Days Complete -  HRI or Home Care Consult Complete -  Social Work Consult for [place] Planning/Counseling Complete Complete  Palliative Care Screening Complete Not Applicable  Medication Review Press photographer) Complete Complete  Some recent data might be hidden

## 2018-12-19 NOTE — Care Management Important Message (Signed)
Important Message  Patient Details  Name: Collin Gonzales MRN: [id number] Date of Birth: [birth year]   Medicare Important Message Given:  Yes     Juliann Pulse A Nichol Ator 12/19/2018, 10:26 AM

## 2018-12-19 NOTE — Progress Notes (Signed)
[place] at [place] NAME: Collin Gonzales    MR#:  [id number]  DATE OF BIRTH:  03/08/1935  SUBJECTIVE:   Patient states he is feeling fine today. He is looking forward to leaving the hospital. No concerns today.  REVIEW OF SYSTEMS:  CONSTITUTIONAL: No fever, has fatigue and weakness.  EYES: No blurred or double vision.  EARS, NOSE, AND THROAT: No tinnitus or ear pain.  RESPIRATORY: No cough, shortness of breath, wheezing or hemoptysis.  CARDIOVASCULAR: No chest pain, orthopnea, edema.  GASTROINTESTINAL: No nausea, vomiting, diarrhea or abdominal pain.  GENITOURINARY: +genital pain SKIN: No rash or lesion. MUSCULOSKELETAL: No joint pain or arthritis.   NEUROLOGIC: No tingling, numbness, weakness.  PSYCHIATRY: No anxiety or depression.   DRUG ALLERGIES:   Allergies  Allergen Reactions  . Plaquenil [Hydroxychloroquine] Other (See Comments)    Bad dreams  . Simvastatin Other (See Comments)    Bad dreams    VITALS:  Blood pressure (!) 97/59, pulse 81, temperature 98.3 F (36.8 C), temperature source Oral, resp. rate 18, height 5\' 7"  (1.702 m), weight 46.7 kg, SpO2 98 %.  PHYSICAL EXAMINATION:  GENERAL:  83 y.o.-year-old patient lying in the bed with no acute distress.  EYES: Pupils equal, round, reactive to light and accommodation. No scleral icterus. Extraocular muscles intact.  HEENT: Head atraumatic, normocephalic. Oropharynx and nasopharynx clear.  NECK:  Supple, no jugular venous distention. No thyroid enlargement, no tenderness.  LUNGS: Normal breath sounds bilaterally, no wheezing, rales,rhonchi or crepitation. No use of accessory muscles of respiration.  CARDIOVASCULAR: RRR, S1, S2 normal. No murmurs, rubs, or gallops.  ABDOMEN: Soft, nontender, nondistended. Bowel sounds present.  GENITOURINARY: penile ulcerations present EXTREMITIES: No pedal edema, cyanosis, or clubbing.  NEUROLOGIC: Cranial nerves II through XII  are intact. Muscle strength global weakness in all extremities. Sensation intact. Gait not checked.  PSYCHIATRIC: The patient is alert and oriented x 3.  SKIN: No rash  LABORATORY PANEL:   CBC No results for input(s): WBC, HGB, HCT, PLT in the last 168 hours. ------------------------------------------------------------------------------------------------------------------  Chemistries  Recent Labs  Lab 12/18/18 0542  CREATININE 0.95   ------------------------------------------------------------------------------------------------------------------  Cardiac Enzymes No results for input(s): TROPONINI in the last 168 hours. ------------------------------------------------------------------------------------------------------------------  RADIOLOGY:  No results found.  EKG:   Orders placed or performed during the hospital encounter of 10/26/18  . ED EKG  . ED EKG    ASSESSMENT AND PLAN:   Balanoposthitis with locally advanced high-grade urothelial carcinoma -Not a surgical candidate -Pain control -Palliative care following  Healthcare associated pneumonia-improved.  Initially meeting sepsis criteria on admission with fever and tachypnea, but sepsis was ruled out with negative cultures -Continue Augmentin for a total 10-day course of antibiotics  Rheumatoid arthritis -Continue home medication methotrexate  Chronic respiratory failure secondary to COPD- patient is stable on his home 2 L O2. -Continue home inhalers  Patient is medically cleared for discharge to SNF.  He has received an offer at [facility]. Awaiting insurance authorization.  Daughter, [name], updated via the phone.  All the records are reviewed and case discussed with Care Management/Social Workerr.  patient.  CODE STATUS: DNR daughter [name] is healthcare power of attorney  TOTAL TIME TAKING CARE OF THIS PATIENT: 33 minutes.   POSSIBLE D/C IN 1-2 DAYS, DEPENDING ON CLINICAL  CONDITION.  Note: This dictation was prepared with Dragon dictation along with smaller phrase technology. Any transcriptional errors that result from this process are unintentional.  Berna Spare Yulisa Chirico M.D on 12/19/2018 at 11:25 AM  Between 7am to 6pm - Pager - [phone]  After 6pm go to [email or web address] - password [email or web address] [facility]  [place] [facility]  Office  [phone]  CC: Primary care physician; [name], MD

## 2018-12-19 NOTE — TOC Progression Note (Signed)
Transition of Care [facility]) - Progression Note    Patient Details  Name: Collin Gonzales MRN: [id number] Date of Birth: 14-Sep-1935  Transition of Care [facility]) CM/SW Contact  Shelbie Hutching, RN Phone Number: 12/19/2018, 12:35 PM  Clinical Narrative:     Authorization received for SNF.  Reference # [id number].  Auth # [id number], approved for 3 days.  Start date 12/20/18, next review 12/22/18.  Care Coordinator [facility].  Authorization information reported to [facility].   Expected Discharge Plan: [place] Barriers to Discharge: Continued Medical Work up  Expected Discharge Plan and Services Expected Discharge Plan: [place] Choice: [place] arrangements for the past 2 months: [place]: RN, Nurse's Aide [place] Agency: [place] ([facility]) Date [place]: 12/12/18   Representative spoke with at [place]: Floydene Flock   Social Determinants of Health (SDOH) Interventions    Readmission Risk Interventions Readmission Risk Prevention Plan 12/12/2018 10/28/2018  Transportation Screening Complete Complete  PCP or Specialist Appt within 3-5 Days Complete -  HRI or Home Care Consult Complete -  Social Work Consult for [place] Planning/Counseling Complete Complete  Palliative Care Screening Complete Not Applicable  Medication Review Press photographer) Complete Complete  Some recent data might be hidden

## 2018-12-19 NOTE — Chronic Care Management (AMB) (Signed)
°  Chronic Care Management   Outreach Note  12/19/2018 Name: Collin Gonzales MRN: [id number] DOB: Oct 30, 1935  Referred by: [name], MD Reason for referral : Chronic Care Management (Initial CCM outreach was unsuccessful. ) and Chronic Care Management (Second CCM outreach was unsuccessful.)   A second unsuccessful telephone outreach was attempted today. The patient was referred to the case management team for assistance with care management and care coordination.   Follow Up Plan: A HIPPA compliant phone message was left for the patient providing contact information and requesting a return call.  The care management team will reach out to the patient again over the next 7 days.  If patient returns call to provider office, please advise to call [place] at [place]  [email or web address]   ??[phone]

## 2018-12-20 DIAGNOSIS — Z23 Encounter for immunization: Secondary | ICD-10-CM | POA: Diagnosis not present

## 2018-12-20 DIAGNOSIS — C689 Malignant neoplasm of urinary organ, unspecified: Secondary | ICD-10-CM | POA: Diagnosis not present

## 2018-12-20 DIAGNOSIS — I69354 Hemiplegia and hemiparesis following cerebral infarction affecting left non-dominant side: Secondary | ICD-10-CM | POA: Diagnosis not present

## 2018-12-20 DIAGNOSIS — M6281 Muscle weakness (generalized): Secondary | ICD-10-CM | POA: Diagnosis not present

## 2018-12-20 DIAGNOSIS — Z87891 Personal history of nicotine dependence: Secondary | ICD-10-CM | POA: Diagnosis not present

## 2018-12-20 DIAGNOSIS — N179 Acute kidney failure, unspecified: Secondary | ICD-10-CM | POA: Diagnosis not present

## 2018-12-20 DIAGNOSIS — N476 Balanoposthitis: Secondary | ICD-10-CM | POA: Diagnosis not present

## 2018-12-20 DIAGNOSIS — D649 Anemia, unspecified: Secondary | ICD-10-CM | POA: Diagnosis not present

## 2018-12-20 DIAGNOSIS — G893 Neoplasm related pain (acute) (chronic): Secondary | ICD-10-CM | POA: Diagnosis not present

## 2018-12-20 DIAGNOSIS — Z515 Encounter for palliative care: Secondary | ICD-10-CM | POA: Diagnosis not present

## 2018-12-20 DIAGNOSIS — E785 Hyperlipidemia, unspecified: Secondary | ICD-10-CM | POA: Diagnosis not present

## 2018-12-20 DIAGNOSIS — C679 Malignant neoplasm of bladder, unspecified: Secondary | ICD-10-CM | POA: Diagnosis not present

## 2018-12-20 DIAGNOSIS — R5381 Other malaise: Secondary | ICD-10-CM | POA: Diagnosis not present

## 2018-12-20 DIAGNOSIS — Y95 Nosocomial condition: Secondary | ICD-10-CM | POA: Diagnosis not present

## 2018-12-20 DIAGNOSIS — J189 Pneumonia, unspecified organism: Secondary | ICD-10-CM | POA: Diagnosis not present

## 2018-12-20 DIAGNOSIS — E43 Unspecified severe protein-calorie malnutrition: Secondary | ICD-10-CM | POA: Diagnosis not present

## 2018-12-20 DIAGNOSIS — Z79899 Other long term (current) drug therapy: Secondary | ICD-10-CM | POA: Diagnosis not present

## 2018-12-20 DIAGNOSIS — Z743 Need for continuous supervision: Secondary | ICD-10-CM | POA: Diagnosis not present

## 2018-12-20 DIAGNOSIS — R918 Other nonspecific abnormal finding of lung field: Secondary | ICD-10-CM | POA: Diagnosis not present

## 2018-12-20 DIAGNOSIS — G4700 Insomnia, unspecified: Secondary | ICD-10-CM | POA: Diagnosis not present

## 2018-12-20 DIAGNOSIS — K219 Gastro-esophageal reflux disease without esophagitis: Secondary | ICD-10-CM | POA: Diagnosis not present

## 2018-12-20 DIAGNOSIS — Z8673 Personal history of transient ischemic attack (TIA), and cerebral infarction without residual deficits: Secondary | ICD-10-CM | POA: Diagnosis not present

## 2018-12-20 DIAGNOSIS — Z9079 Acquired absence of other genital organ(s): Secondary | ICD-10-CM | POA: Diagnosis not present

## 2018-12-20 DIAGNOSIS — Z85118 Personal history of other malignant neoplasm of bronchus and lung: Secondary | ICD-10-CM | POA: Diagnosis not present

## 2018-12-20 DIAGNOSIS — J449 Chronic obstructive pulmonary disease, unspecified: Secondary | ICD-10-CM | POA: Diagnosis not present

## 2018-12-20 DIAGNOSIS — Z801 Family history of malignant neoplasm of trachea, bronchus and lung: Secondary | ICD-10-CM | POA: Diagnosis not present

## 2018-12-20 DIAGNOSIS — J9610 Chronic respiratory failure, unspecified whether with hypoxia or hypercapnia: Secondary | ICD-10-CM | POA: Diagnosis not present

## 2018-12-20 DIAGNOSIS — E7849 Other hyperlipidemia: Secondary | ICD-10-CM | POA: Diagnosis not present

## 2018-12-20 DIAGNOSIS — C3490 Malignant neoplasm of unspecified part of unspecified bronchus or lung: Secondary | ICD-10-CM | POA: Diagnosis not present

## 2018-12-20 DIAGNOSIS — M069 Rheumatoid arthritis, unspecified: Secondary | ICD-10-CM | POA: Diagnosis not present

## 2018-12-20 DIAGNOSIS — M2550 Pain in unspecified joint: Secondary | ICD-10-CM | POA: Diagnosis not present

## 2018-12-20 DIAGNOSIS — Z8546 Personal history of malignant neoplasm of prostate: Secondary | ICD-10-CM | POA: Diagnosis not present

## 2018-12-20 DIAGNOSIS — N390 Urinary tract infection, site not specified: Secondary | ICD-10-CM | POA: Diagnosis not present

## 2018-12-20 MED ORDER — OXYCODONE HCL ER 10 MG PO T12A: 10.0000 mg | EXTENDED_RELEASE_TABLET | Freq: Two times a day (BID) | ORAL | 0 refills | Status: AC

## 2018-12-20 MED ORDER — OXYCODONE HCL 10 MG PO TABS: 10.0000 mg | ORAL_TABLET | ORAL | 0 refills | Status: AC | PRN

## 2018-12-20 MED ORDER — GERHARDT'S BUTT CREAM: 1.0000 "application " | TOPICAL_CREAM | Freq: Three times a day (TID) | CUTANEOUS | 0 refills | Status: AC

## 2018-12-20 MED ORDER — ENSURE ENLIVE PO LIQD: 237.0000 mL | Freq: Two times a day (BID) | ORAL | 12 refills | Status: AC

## 2018-12-20 MED ORDER — POLYETHYLENE GLYCOL 3350 17 G PO PACK: 17.0000 g | PACK | Freq: Every day | ORAL | 0 refills | Status: AC | PRN

## 2018-12-20 NOTE — TOC Transition Note (Signed)
Transition of Care [facility]) - CM/SW Discharge Note   Patient Details  Name: Collin Gonzales MRN: [id number] Date of Birth: 15-Sep-1935  Transition of Care [facility]) CM/SW Contact:  Shelbie Hutching, RN Phone Number: 12/20/2018, 10:44 AM   Clinical Narrative:     Patient will discharge to [facility] today, [facility] will provide transportation.  Bedside RN will call report to [phone].    Final next level of care: [place] Barriers to Discharge: Barriers Resolved   Patient Goals and CMS Choice     Choice offered to / list presented to : NA  Discharge Placement              Patient [facility] bed at: [facility] Patient to be transferred to facility by: [facility] Name of family member notified: [name]- daughter Patient and family notified of of transfer: 12/20/18  Discharge Plan and Services     Post Acute Care Choice: [place]: RN, Nurse's Aide [facility] Agency: [place] ([place]) Date [facility] Agency Contacted: 12/12/18   Representative spoke with at [place]: Floydene Flock  Social Determinants of Health ([place]) Interventions     Readmission Risk Interventions Readmission Risk Prevention Plan 12/12/2018 10/28/2018  Transportation Screening Complete Complete  PCP or Specialist Appt within 3-5 Days Complete -  HRI or Home Care Consult Complete -  Social Work Consult for [place] Planning/Counseling Complete Complete  Palliative Care Screening Complete Not Applicable  Medication Review Press photographer) Complete Complete  Some recent data might be hidden

## 2018-12-20 NOTE — Discharge Summary (Addendum)
Collin Gonzales at [place] NAME: Collin Gonzales    MR#:  [id number]  DATE OF BIRTH:  Jul 12, 1935  DATE OF ADMISSION:  12/11/2018   ADMITTING PHYSICIAN: Lance Coon, MD  DATE OF DISCHARGE: 12/20/18  PRIMARY CARE PHYSICIAN: [name], MD   ADMISSION DIAGNOSIS:  Balanoposthitis [N47.6] Urinary tract infection without hematuria, site unspecified [N39.0] DISCHARGE DIAGNOSIS:  Principal Problem:   Balanoposthitis Active Problems:   COPD (chronic obstructive pulmonary disease) (HCC)   Arthritis or polyarthritis, rheumatoid (HCC)   UTI (urinary tract infection)   HCAP (healthcare-associated pneumonia)   AKI (acute kidney injury) ([place])   Palliative care encounter   Protein-calorie malnutrition, severe  SECONDARY DIAGNOSIS:   Past Medical History:  Diagnosis Date  . Arthritis   . Collagen vascular disease (HCC)    rheumatoid arthritis  . COPD (chronic obstructive pulmonary disease) ([place])   . GERD (gastroesophageal reflux disease)   . History of prostate cancer   . HLD (hyperlipidemia)   . HOH (hard of hearing)   . Insomnia   . Oxygen deficit    2L HS AND PRN  . Primary cancer of right lung ([place]) 2017   rad tx's.  . Prostate cancer ([place]) 2006   Rad seed tx's.  . Stroke ([place])    2000  . Urothelial carcinoma of bladder ([place]) 10/2017   TURP to Prostate and chemo tx's.   HOSPITAL COURSE:   Collin Gonzales is an 83 year old male who presented to the ED with genital pain. In the ED, he was found to have a possible UTI and pneumonia. He was noted to have significant erythema and pain in the genital area. He was admitted for further management.  Balanoposthitis with locally advanced high-grade urothelial carcinoma -Not a surgical candidate -Pain control -Palliative care to follow on discharge -Wound care instructions: apply Gerhardt's butt cream to genital area three times daily. Apply nystatin cream to penis twice daily. -Needs to  follow-up with oncology and urology as an outpatient  Healthcare associated pneumonia-improved.  Initially meeting sepsis criteria on admission with fever and tachypnea, but sepsis was ruled out with negative cultures -Completed course of antibiotics while in the hospital  Rheumatoid arthritis -Continued home methotrexate  Chronic respiratory failure secondary to COPD- patient is stable on his home 2 L O2. -Continued home inhalers -Continue supplemental oxygen  DISCHARGE CONDITIONS:  Locally advanced high-grade urothelial carcinoma HCAP Rheumatoid arthritis Chronic respiratory failure due to COPD on 2L O2 at baseline CONSULTS OBTAINED:  Treatment Team:  Hollice Espy, MD DRUG ALLERGIES:   Allergies  Allergen Reactions  . Plaquenil [Hydroxychloroquine] Other (See Comments)    Bad dreams  . Simvastatin Other (See Comments)    Bad dreams   DISCHARGE MEDICATIONS:   Allergies as of 12/20/2018      Reactions   Plaquenil [hydroxychloroquine] Other (See Comments)   Bad dreams   Simvastatin Other (See Comments)   Bad dreams      Medication List    STOP taking these medications   cephALEXin 500 MG capsule Commonly known as: Keflex     TAKE these medications   feeding supplement (ENSURE ENLIVE) Liqd Take 237 mLs by mouth 2 (two) times daily between meals.   Gerhardt's butt cream Crea Apply 1 application topically 3 (three) times daily.   megestrol 20 MG tablet Commonly known as: MEGACE Take 1 tablet (20 mg total) by mouth 2 (two) times daily.   methotrexate 2.5 MG tablet Commonly known  asRhett Bannister Take 4 tablets by mouth every Thursday.   nystatin cream Commonly known as: MYCOSTATIN Apply 1 application topically 2 (two) times daily.   oxyCODONE 5 MG immediate release tablet Commonly known as: Oxy IR/ROXICODONE Take 2 tablets (10 mg total) by mouth every 6 (six) hours as needed for severe pain. What changed: Another medication with the same name was  added. Make sure you understand how and when to take each.   Oxycodone HCl 10 MG Tabs Take 1 tablet (10 mg total) by mouth every 4 (four) hours as needed for moderate pain or breakthrough pain (5 mg for moderate pain, 10 mg for severe pain). What changed: You were already taking a medication with the same name, and this prescription was added. Make sure you understand how and when to take each.   oxyCODONE 10 mg 12 hr tablet Commonly known as: OXYCONTIN Take 1 tablet (10 mg total) by mouth every 12 (twelve) hours. What changed: You were already taking a medication with the same name, and this prescription was added. Make sure you understand how and when to take each.   OXYGEN Place 4 L into the nose daily as needed (shortness of breath).   polyethylene glycol 17 g packet Commonly known as: MIRALAX / GLYCOLAX Take 17 g by mouth daily as needed for mild constipation or moderate constipation.   QUEtiapine 50 MG tablet Commonly known as: SEROQUEL Take 1 tablet (50 mg total) by mouth at bedtime.   Trelegy Ellipta 100-62.5-25 MCG/INH Aepb Generic drug: Fluticasone-Umeclidin-Vilant Inhale 1 puff into the lungs daily.        DISCHARGE INSTRUCTIONS:  1. F/u with PCP in 5 days 2. F/u with urology in 1-2 weeks 3. F/u with oncology in 2 weeks DIET:  Regular diet with ensure bid DISCHARGE CONDITION:  Stable ACTIVITY:  Activity as tolerated OXYGEN:  Home Oxygen: Yes.    Oxygen Delivery: 2 liters/min via Patient connected to nasal cannula oxygen DISCHARGE LOCATION:  nursing home   If you experience worsening of your admission symptoms, develop shortness of breath, life threatening emergency, suicidal or homicidal thoughts you must seek medical attention immediately by calling 911 or calling your MD immediately  if symptoms less severe.  You Must read complete instructions/literature along with all the possible adverse reactions/side effects for all the Medicines you take and that  have been prescribed to you. Take any new Medicines after you have completely understood and accpet all the possible adverse reactions/side effects.   Please note  You were cared for by a hospitalist during your hospital stay. If you have any questions about your discharge medications or the care you received while you were in the hospital after you are discharged, you can call the unit and asked to speak with the hospitalist on call if the hospitalist that took care of you is not available. Once you are discharged, your primary care physician will handle any further medical issues. Please note that NO REFILLS for any discharge medications will be authorized once you are discharged, as it is imperative that you return to your primary care physician (or establish a relationship with a primary care physician if you do not have one) for your aftercare needs so that they can reassess your need for medications and monitor your lab values.    On the day of Discharge:  VITAL SIGNS:  Blood pressure (!) 95/51, pulse 75, temperature 98.4 F (36.9 C), temperature source Oral, resp. rate 16, height 5\' 7"  (1.702 m),  weight 46.7 kg, SpO2 99 %. PHYSICAL EXAMINATION:  GENERAL:  83 y.o.-year-old patient lying in the bed with no acute distress.  EYES: Pupils equal, round, reactive to light and accommodation. No scleral icterus. Extraocular muscles intact.  HEENT: Head atraumatic, normocephalic. Oropharynx and nasopharynx clear.  NECK:  Supple, no jugular venous distention. No thyroid enlargement, no tenderness.  LUNGS: Normal breath sounds bilaterally, no wheezing, rales,rhonchi or crepitation. No use of accessory muscles of respiration.  CARDIOVASCULAR: RRR, S1, S2 normal. No murmurs, rubs, or gallops.  ABDOMEN: Soft, nontender, nondistended. Bowel sounds present.   GENITOURINARY: penile ulcerations and erythema present EXTREMITIES: No pedal edema, cyanosis, or clubbing.  NEUROLOGIC: Cranial nerves II through  XII are intact. Muscle strength global weakness in all extremities. Sensation intact. Gait not checked.  PSYCHIATRIC: The patient is alert and oriented x 3.  SKIN: No rash DATA REVIEW:   CBC No results for input(s): WBC, HGB, HCT, PLT in the last 168 hours.  Chemistries  Recent Labs  Lab 12/18/18 0542  CREATININE 0.95     Microbiology Results  Results for orders placed or performed during the hospital encounter of 12/11/18  Blood Culture (routine x 2)     Status: None   Collection Time: 12/11/18  6:40 PM   Specimen: BLOOD  Result Value Ref Range Status   Specimen Description BLOOD BLOOD LEFT FOREARM  Final   Special Requests   Final    BOTTLES DRAWN AEROBIC AND ANAEROBIC Blood Culture adequate volume   Culture   Final    NO GROWTH 5 DAYS Performed at [facility], [address] [place], [place] [address]    Report Status 12/16/2018 FINAL  Final  Blood Culture (routine x 2)     Status: None   Collection Time: 12/11/18  6:40 PM   Specimen: BLOOD  Result Value Ref Range Status   Specimen Description BLOOD LEFT ANTECUBITAL  Final   Special Requests   Final    BOTTLES DRAWN AEROBIC AND ANAEROBIC Blood Culture adequate volume   Culture   Final    NO GROWTH 5 DAYS Performed at [facility], [address] [place], [place] [address]    Report Status 12/16/2018 FINAL  Final  Urine culture     Status: Abnormal   Collection Time: 12/11/18  7:54 PM   Specimen: In/Out Cath Urine  Result Value Ref Range Status   Specimen Description   Final    IN/OUT CATH URINE Performed at [facility], [address], [place], [place] [address]    Special Requests   Final    NONE Performed at [facility], [address] [place],  [address]    Culture MULTIPLE SPECIES PRESENT, SUGGEST RECOLLECTION (A)  Final   Report Status 12/12/2018 FINAL  Final  SARS CORONAVIRUS 2 (TAT 6-24 HRS) Nasopharyngeal Nasopharyngeal Swab     Status: None    Collection Time: 12/11/18  9:27 PM   Specimen: Nasopharyngeal Swab  Result Value Ref Range Status   SARS Coronavirus 2 NEGATIVE NEGATIVE Final    Comment: (NOTE) SARS-CoV-2 target nucleic acids are NOT DETECTED. The SARS-CoV-2 RNA is generally detectable in upper and lower respiratory specimens during the acute phase of infection. Negative results do not preclude SARS-CoV-2 infection, do not rule out co-infections with other pathogens, and should not be used as the sole basis for treatment or other patient management decisions. Negative results must be combined with clinical observations, patient history, and epidemiological information. The expected  result is Negative. Fact Sheet for Patients: SugarRoll.be Fact Sheet for Healthcare Providers: https://www.woods-mathews.com/ This test is not yet approved or cleared by the [place] FDA and  has been authorized for detection and/or diagnosis of SARS-CoV-2 by FDA under an Emergency Use Authorization (EUA). This EUA will remain  in effect (meaning this test can be used) for the duration of the COVID-19 declaration under Section 56 4(b)(1) of the Act, 21 U.S.C. section 360bbb-3(b)(1), unless the authorization is terminated or revoked sooner. Performed at [facility], [place] [address], [place], [place] [address]   MRSA PCR Screening     Status: None   Collection Time: 12/12/18  5:44 PM   Specimen: Nasal Mucosa; Nasopharyngeal  Result Value Ref Range Status   MRSA by PCR NEGATIVE NEGATIVE Final    Comment:        The GeneXpert MRSA Assay (FDA approved for NASAL specimens only), is one component of a comprehensive MRSA colonization surveillance program. It is not intended to diagnose MRSA infection nor to guide or monitor treatment for MRSA infections. Performed at [facility], [place], [place] [address]   SARS CORONAVIRUS 2 (TAT 6-24 HRS) Nasopharyngeal  Nasopharyngeal Swab     Status: None   Collection Time: 12/19/18 12:30 AM   Specimen: Nasopharyngeal Swab  Result Value Ref Range Status   SARS Coronavirus 2 NEGATIVE NEGATIVE Final    Comment: (NOTE) SARS-CoV-2 target nucleic acids are NOT DETECTED. The SARS-CoV-2 RNA is generally detectable in upper and lower respiratory specimens during the acute phase of infection. Negative results do not preclude SARS-CoV-2 infection, do not rule out co-infections with other pathogens, and should not be used as the sole basis for treatment or other patient management decisions. Negative results must be combined with clinical observations, patient history, and epidemiological information. The expected result is Negative. Fact Sheet for Patients: SugarRoll.be Fact Sheet for Healthcare Providers: https://www.woods-mathews.com/ This test is not yet approved or cleared by the [place] FDA and  has been authorized for detection and/or diagnosis of SARS-CoV-2 by FDA under an Emergency Use Authorization (EUA). This EUA will remain  in effect (meaning this test can be used) for the duration of the COVID-19 declaration under Section 56 4(b)(1) of the Act, 21 U.S.C. section 360bbb-3(b)(1), unless the authorization is terminated or revoked sooner. Performed at [facility], [place] [address], [place], [place] [address]     RADIOLOGY:  No results found.   Management plans discussed with the patient, family and they are in agreement.  CODE STATUS: DNR   TOTAL TIME TAKING CARE OF THIS PATIENT: 40 minutes.    Berna Spare Mayo M.D on 12/20/2018 at 10:00 AM  Between 7am to 6pm - Pager - [phone]  After 6pm go to [email or web address] - [employer] [facility]  Office  [phone]  CC: Primary care physician; [name], MD   Note: This dictation was prepared with Dragon dictation along with smaller phrase  technology. Any transcriptional errors that result from this process are unintentional.

## 2018-12-20 NOTE — Progress Notes (Signed)
[facility] and spoke to nurse receiving patient Collin Gonzales and gave her report.

## 2018-12-21 DIAGNOSIS — R5381 Other malaise: Secondary | ICD-10-CM | POA: Diagnosis not present

## 2018-12-21 DIAGNOSIS — C689 Malignant neoplasm of urinary organ, unspecified: Secondary | ICD-10-CM | POA: Diagnosis not present

## 2018-12-21 DIAGNOSIS — N476 Balanoposthitis: Secondary | ICD-10-CM | POA: Diagnosis not present

## 2018-12-23 NOTE — Progress Notes (Deleted)
[place]  [phone] Fax:(336) [phone]  ID: Collin Gonzales OB: 06-12-1935  MR#: [id number]  OZD#:[id number]  Patient Care Team: [name], MD as PCP - General (Family Medicine) [name], MD as Referring Physician (Ophthalmology) [name], MD as Consulting Physician (Rheumatology) [name], MD as Referring Physician (Specialist) [name], MD as Consulting Physician (Urology) [name], MD as Referring Physician (Radiation Oncology)  CHIEF COMPLAINT: Locally advanced high-grade urothelial carcinoma.    INTERVAL HISTORY: Patient returns to clinic today for further evaluation and consideration of cycle 17 of Tecentriq.  He has had significant pelvic/penile pain today and appears uncomfortable. He continues to have incontinence secondary to his known fistula.  He has no neurologic complaints.  He denies any recent fevers or illnesses.  He has chronic shortness of breath, but denies any chest pain, hemoptysis, or cough.  He has no nausea, vomiting, constipation, or diarrhea.  He denies any hematuria.  Patient offers no further specific complaints today.  REVIEW OF SYSTEMS:   Review of Systems  Constitutional: Negative.  Negative for fever, malaise/fatigue and weight loss.  Respiratory: Positive for shortness of breath. Negative for cough and hemoptysis.   Cardiovascular: Negative.  Negative for chest pain and leg swelling.  Gastrointestinal: Negative.  Negative for abdominal pain, blood in stool and melena.  Genitourinary: Positive for frequency and urgency. Negative for dysuria and hematuria.       Pelvic pain  Musculoskeletal: Negative.  Negative for back pain.  Skin: Negative.  Negative for rash.  Neurological: Negative.  Negative for sensory change, focal weakness, weakness and headaches.  Psychiatric/Behavioral: Negative.  The patient is not nervous/anxious.     As per HPI. Otherwise, a  complete review of systems is negative.  PAST MEDICAL HISTORY: Past Medical History:  Diagnosis Date   Arthritis    Collagen vascular disease (HCC)    rheumatoid arthritis   COPD (chronic obstructive pulmonary disease) (HCC)    GERD (gastroesophageal reflux disease)    History of prostate cancer    HLD (hyperlipidemia)    HOH (hard of hearing)    Insomnia    Oxygen deficit    2L HS AND PRN   Primary cancer of right lung ([place]) 2017   rad tx's.   Prostate cancer ([place]) 2006   Rad seed tx's.   Stroke [facility])    2000   Urothelial carcinoma of bladder ([place]) 10/2017   TURP to Prostate and chemo tx's.    PAST SURGICAL HISTORY: Past Surgical History:  Procedure Laterality Date   APPENDECTOMY  1965   CATARACT EXTRACTION W/PHACO Right 01/05/2016   Procedure: CATARACT EXTRACTION PHACO AND INTRAOCULAR LENS PLACEMENT ([place]);  Surgeon: [name], MD;  Location: [facility] ORS;  Service: Ophthalmology;  Laterality: Right;  [place] 1.14 AP% 18.1 CDE 13.44 FLUID PACK LOT # [id number]   CYSTOSCOPY W/ RETROGRADES Bilateral 11/01/2017   Procedure: CYSTOSCOPY WITH RETROGRADE PYELOGRAM;  Surgeon: Hollice Espy, MD;  Location: [facility] ORS;  Service: Urology;  Laterality: Bilateral;   ENDOBRONCHIAL ULTRASOUND N/A 02/26/2015   Procedure: ENDOBRONCHIAL ULTRASOUND;  Surgeon: Flora Lipps, MD;  Location: [facility] ORS;  Service: Cardiopulmonary;  Laterality: N/A;   [place] RADIATION SEED  2002   PORTA CATH INSERTION N/A 05/14/2018   Procedure: PORTA CATH INSERTION;  Surgeon: Algernon Huxley, MD;  Location: [place] CV LAB;  Service: Cardiovascular;  Laterality: N/A;   TRANSURETHRAL RESECTION OF PROSTATE N/A 11/01/2017  Procedure: TRANSURETHRAL RESECTION OF THE PROSTATE (TURP) (channel TURP);  Surgeon: Hollice Espy, MD;  Location: [facility] ORS;  Service: Urology;  Laterality: N/A;    FAMILY HISTORY: Family History  Problem Relation Age of Onset   Cancer Mother     Cancer Father        Lung Cancer   Cancer Sister        breast   Cancer Brother        stomach    ADVANCED DIRECTIVES (Y/N):  N  HEALTH MAINTENANCE: Social History   Tobacco Use   Smoking status: Former Smoker    Packs/day: 0.50    Years: 50.00    Pack years: 25.00    Types: Cigarettes    Quit date: 02/21/2013    Years since quitting: 5.8   Smokeless tobacco: Never Used   Tobacco comment: smoking cessation materials not required  Substance Use Topics   Alcohol use: No    Alcohol/week: 0.0 standard drinks   Drug use: No     Colonoscopy:  PAP:  Bone density:  Lipid panel:  Allergies  Allergen Reactions   Plaquenil [Hydroxychloroquine] Other (See Comments)    Bad dreams   Simvastatin Other (See Comments)    Bad dreams    Current Outpatient Medications  Medication Sig Dispense Refill   feeding supplement, ENSURE ENLIVE, (ENSURE ENLIVE) LIQD Take 237 mLs by mouth 2 (two) times daily between meals. 237 mL 12   Fluticasone-Umeclidin-Vilant (TRELEGY ELLIPTA) 100-62.5-25 MCG/INH AEPB Inhale 1 puff into the lungs daily. 60 each 5   Hydrocortisone (GERHARDT'S BUTT CREAM) CREA Apply 1 application topically 3 (three) times daily. 1 each 0   megestrol (MEGACE) 20 MG tablet Take 1 tablet (20 mg total) by mouth 2 (two) times daily. 180 tablet 1   methotrexate (RHEUMATREX) 2.5 MG tablet Take 4 tablets by mouth every Thursday.      nystatin cream (MYCOSTATIN) Apply 1 application topically 2 (two) times daily. 30 g 0   oxyCODONE (OXY IR/ROXICODONE) 5 MG immediate release tablet Take 2 tablets (10 mg total) by mouth every 6 (six) hours as needed for severe pain. 30 tablet 0   oxyCODONE (OXYCONTIN) 10 mg 12 hr tablet Take 1 tablet (10 mg total) by mouth every 12 (twelve) hours. 10 tablet 0   oxyCODONE 10 MG TABS Take 1 tablet (10 mg total) by mouth every 4 (four) hours as needed for moderate pain or breakthrough pain (5 mg for moderate pain, 10 mg for severe pain).  20 tablet 0   OXYGEN Place 4 L into the nose daily as needed (shortness of breath).      polyethylene glycol (MIRALAX / GLYCOLAX) 17 g packet Take 17 g by mouth daily as needed for mild constipation or moderate constipation. 14 each 0   QUEtiapine (SEROQUEL) 50 MG tablet Take 1 tablet (50 mg total) by mouth at bedtime. 90 tablet 0   No current facility-administered medications for this visit.     OBJECTIVE: There were no vitals filed for this visit.   There is no height or weight on file to calculate BMI.    ECOG FS:0 - Asymptomatic  General: Thin, mild distress secondary to pelvic/penile pain: Sitting in a wheelchair. Eyes: Pink conjunctiva, anicteric sclera. HEENT: Normocephalic, moist mucous membranes. Lungs: Clear to auscultation bilaterally. Heart: Regular rate and rhythm. No rubs, murmurs, or gallops. Abdomen: Soft, nontender, nondistended. No organomegaly noted, normoactive bowel sounds. Musculoskeletal: No edema, cyanosis, or clubbing. Neuro: Alert, answering all questions appropriately.  Cranial nerves grossly intact. Skin: No rashes or petechiae noted. Psych: Normal affect.  LAB RESULTS:  Lab Results  Component Value Date   NA 141 12/12/2018   K 4.7 12/12/2018   CL 109 12/12/2018   CO2 25 12/12/2018   GLUCOSE 96 12/12/2018   BUN 41 (H) 12/12/2018   CREATININE 0.95 12/18/2018   CALCIUM 8.8 (L) 12/12/2018   PROT 8.0 12/11/2018   ALBUMIN 2.4 (L) 12/11/2018   AST 33 12/11/2018   ALT 13 12/11/2018   ALKPHOS 49 12/11/2018   BILITOT 0.4 12/11/2018   GFRNONAA >60 12/18/2018   GFRAA >60 12/18/2018    Lab Results  Component Value Date   WBC 8.1 12/12/2018   NEUTROABS 7.9 (H) 12/11/2018   HGB 8.6 (L) 12/12/2018   HCT 28.6 (L) 12/12/2018   MCV 93.2 12/12/2018   PLT 273 12/12/2018     STUDIES: Dg Chest Port 1 View  Result Date: 12/11/2018 CLINICAL DATA:  Fever EXAM: PORTABLE CHEST 1 VIEW COMPARISON:  October 28, 2018 FINDINGS: There is underlying  emphysematous change with fibrosis throughout the lower lobes. There is patchy infiltrate superimposed on fibrosis in the right base. The heart size is normal. The pulmonary vascularity reflects the underlying emphysematous change. No adenopathy. Port-A-Cath tip is in the superior vena cava. There is a skin fold on the left but no evident pneumothorax. No bone lesions. IMPRESSION: Airspace opacity consistent with pneumonia in the right base, superimposed on emphysematous change in fibrosis. Heart size within normal limits. Stable pulmonary vascularity. No adenopathy evident. Port-A-Cath tip in superior vena cava.  Skin fold on left. Followup PA and lateral chest radiographs recommended in 3-4 weeks following trial of antibiotic therapy to ensure resolution and exclude underlying malignancy. Electronically Signed   By: Lowella Grip III M.D.   On: 12/11/2018 19:55    ASSESSMENT: Locally advanced high-grade urothelial carcinoma.  PLAN:    1. Locally advanced high-grade urothelial carcinoma: Case discussed with urology, radiation oncology, as well as multidisciplinary tumor board.  Patient is not a surgical candidate nor can he receive additional XRT given his history of prostate cancer and brachii therapy seed placement.  Though he has a decent performance status, patient is frail and likely would not tolerate cisplatin based therapy.  CT scan results from September 19, 2018 reviewed independently with no obvious evidence of recurrent or progressive disease outside patient's bladder. Previously, hospice and end-of-life were discussed but patient is not interested at this time.  Despite evidence of fistula, patient is not a surgical candidate therefore will continue to monitor with routine CT scans.  Proceed with cycle 17 of Tecentriq today.  Return to clinic in 3 weeks for further evaluation and consideration of continuation of treatment.  Will get a CT scan prior to next visit.   2.  Right upper lobe lung  mass: Previously, although biopsy was negative, patient had a positive PET scan that was highly suspicious for underlying malignancy. He underwent SBRT in approximately May 2017.   3.  Shortness of breath: Chronic and unchanged.  Patient now requires oxygen 24 hours/day although is admittedly noncompliant. 4.  Anemia: Chronic and unchanged.  Patient's hemoglobin has trended down to 8.9. 5.  Hematuria: Patient does not complain of this today.  Appreciate urology input. 6.  Renal insufficiency: Resolved. 7.  Leukocytosis: Resolved. 8.  Pain: Continue oxycodone 10 mg every 4 hours as needed.  Patient was given a refill today.  He also was given a stat referral back to  urology for further evaluation.  Appreciate their input and accommodation.  Patient expressed understanding and was in agreement with this plan. He also understands that He can call clinic at any time with any questions, concerns, or complaints.    Lloyd Huger, MD   12/23/2018 7:59 AM

## 2018-12-24 NOTE — Chronic Care Management (AMB) (Signed)
Chronic Care Management   Note  12/24/2018 Name: [name] MRN: [id number] DOB: Dec 31, 1935  Collin Gonzales is a 83 y.o. year old male who is a primary care patient of [name], MD. I reached out to [name] by phone today in response to a referral sent by Mr. Noam Karaffa Bonfanti's health plan.     Mr. Visser was given information about Chronic Care Management services today including:  1. CCM service includes personalized support from designated clinical staff supervised by his physician, including individualized plan of care and coordination with other care providers 2. 24/7 contact phone numbers for assistance for urgent and routine care needs. 3. Service will only be billed when office clinical staff spend 20 minutes or more in a month to coordinate care. 4. Only one practitioner may furnish and bill the service in a calendar month. 5. The patient may stop CCM services at any time (effective at the end of the month) by phone call to the office staff. 6. The patient will be responsible for cost sharing (co-pay) of up to 20% of the service fee (after annual deductible is met).  Patient did not agree to enrollment in care management services and does not wish to consider at this time.  Follow up plan: The patient has been provided with contact information for the chronic care management team and has been advised to call with any health related questions or concerns.   [place]  [email or web address]   ??[phone]

## 2018-12-25 DIAGNOSIS — N476 Balanoposthitis: Secondary | ICD-10-CM | POA: Diagnosis not present

## 2018-12-25 DIAGNOSIS — C689 Malignant neoplasm of urinary organ, unspecified: Secondary | ICD-10-CM | POA: Diagnosis not present

## 2018-12-25 DIAGNOSIS — R5381 Other malaise: Secondary | ICD-10-CM | POA: Diagnosis not present

## 2018-12-25 DIAGNOSIS — J9610 Chronic respiratory failure, unspecified whether with hypoxia or hypercapnia: Secondary | ICD-10-CM | POA: Diagnosis not present

## 2018-12-27 ENCOUNTER — Inpatient Hospital Stay: Payer: Medicare Other | Admitting: Oncology

## 2018-12-27 ENCOUNTER — Inpatient Hospital Stay: Payer: Medicare Other

## 2018-12-27 ENCOUNTER — Inpatient Hospital Stay: Payer: Medicare Other | Admitting: Hospice and Palliative Medicine

## 2018-12-27 DIAGNOSIS — C679 Malignant neoplasm of bladder, unspecified: Secondary | ICD-10-CM

## 2018-12-27 NOTE — Progress Notes (Signed)
Nutrition  Appointments including nutrition appointment cancelled for today as patient in SNF.  Collin Gonzales [name], [place], [place] Registered Dietitian [phone] (pager)

## 2019-01-02 ENCOUNTER — Encounter: Payer: Self-pay | Admitting: Nurse Practitioner

## 2019-01-02 ENCOUNTER — Non-Acute Institutional Stay: Payer: Medicare Other | Admitting: Nurse Practitioner

## 2019-01-02 VITALS — BP 90/56 | HR 73 | Temp 97.7°F | Resp 18 | Wt 94.4 lb

## 2019-01-02 DIAGNOSIS — Z515 Encounter for palliative care: Secondary | ICD-10-CM

## 2019-01-02 DIAGNOSIS — C3490 Malignant neoplasm of unspecified part of unspecified bronchus or lung: Secondary | ICD-10-CM

## 2019-01-02 NOTE — Progress Notes (Signed)
[facility] Palliative Care Consult Note Telephone: [phone]  Fax: [phone]  PATIENT NAME: Collin Gonzales DOB: September 11, 1935 MRN: [id number]  PRIMARY CARE PROVIDER:   [name], MD  REFERRING PROVIDER:  [name], MD [address] [place],   [address]  RESPONSIBLE PARTY:   Self I was asked to see Collin Gonzales by Dr Nyra Capes for Palliative care consult for [place] and PLAN:  1. ACP : DNR; continue with oncology  2. Anorexia appetite remaining declined. Continue to encourage supplements and comfort feedings. Discuss nutrition  3.Palliative care encounter Palliative medicine team will continue to support patient, patient's family, and medical team. Visit consisted of counseling and education dealing with the complex and emotionally intense issues of symptom management and palliative care in the setting of serious and potentially life-threatening illness  I spent 65 minutes providing this consultation,  from 1:00pm to 2:05pm. More than 50% of the time in this consultation was spent coordinating communication.   HISTORY OF PRESENT ILLNESS:  Collin Gonzales is a 83 y.o. year old male with multiple medical problems including .Lung cancer s/p radiation (2017), Urothelial carcinoma bladder/prostate cancer s/p turp with rad seed to prostate in chemotherapy, stroke with hemiparesis(2000), COPD, O2 dependent, atrial fibrillation, aortic atherosclerosis, collagen vascular disease, rheumatory arthritis, gerd, hyperlipidemia, hard of hearing, insomnia, thrombocytopenia, iron deficiency anemia, protein calorie malnutrition, porta cath insertion, hernia repair, appendectomy and, cataracts extraction with/phaco. Hospitalize 9 / 4 / 2020 to 9 / 9 / 2020 for secondary to a fall,  UTI require antibiotic therapy, rheumatory arthritis continue methotrexate. Hospitalized 10 / 20 / 2020 to 10 / 29 / 2020 for Balanoposthitis with  locally high-grade urothelial carcinoma not a surgical candidate with healthcare-associated pneumonia, chronic respiratory failure second to COPD. He is a DNR. He was seen by palliative care during hospitalization. He was discharged to Short-Term Rehab at [facility] where he currently resides. He is able to ambulate and transfer with therapy. He does require assistance with ADLs. He is able to feed himself an appetite has been poor. He has had a 20 pound weight loss in 6 months. He has stage 2 to lower back, Open Stage 2 to left buttocks, right hip stage 2 and areas of excoriation to penis and scrotum. He does get Oxycodone 5 mg q 4 hours as needed for pain. He is able to verbalize his needs. I visited and observed Collin Gonzales. We talked about purpose for palliative care visit and he was an agreement. We talked about how he was feeling today. Collin Gonzales endorses he is doing okay. Pain is being managed with oxycodone. We talked about his appetite which has been improving. He currently is eating ribs for lunch. We talked about past medical history in the city of chronic disease with progression including his cancer. We talked about his oncology treatments with radiation and chemotherapy in the past. We talked talked about Life review as he worked in Tourist information centre manager. He is divorced and has a daughter who does help him. He does live independently by himself. We talked about medical goals of care for which he wishes to treat what is treatable. DNR is in place. We talked about role of palliative care and plan of care. Collin Gonzales wants to continue to be followed at the [facility] for aggressive interventions. His hope is to continue to work with therapy and be able to be discharged home. Discuss that will follow them one  week or sooner should he declined. Collin Gonzales in agreement. He is his own Media planner. I have updated nursing staff and any changes to current goals or plan of care.  Oncology plan:   1. Locally advanced high-grade urothelial carcinoma: Case discussed with urology, radiation oncology, as well as multidisciplinary tumor board.  Patient is not a surgical candidate nor can he receive additional XRT given his history of prostate cancer and brachii therapy seed placement.  Though he has a decent performance status, patient is frail and likely would not tolerate cisplatin based therapy.  CT scan results from September 19, 2018 reviewed independently with no obvious evidence of recurrent or progressive disease outside patient's bladder. Previously, hospice and end-of-life were discussed but patient is not interested at this time.  Despite evidence of fistula, patient is not a surgical candidate therefore will continue to monitor with routine CT scans.  Proceed with cycle 17 of Tecentriq today.  Return to clinic in 3 weeks for further evaluation and consideration of continuation of treatment.  Will get a CT scan prior to next visit.   2.  Right upper lobe lung mass: Previously, although biopsy was negative, patient had a positive PET scan that was highly suspicious for underlying malignancy. He underwent SBRT in approximately May 2017.    Palliative Care was asked to help address goals of care.   CODE STATUS: DNR  PPS: 40% HOSPICE ELIGIBILITY/DIAGNOSIS: TBD  PAST MEDICAL HISTORY:  Past Medical History:  Diagnosis Date  . Arthritis   . Collagen vascular disease (HCC)    rheumatoid arthritis  . COPD (chronic obstructive pulmonary disease) ([place])   . GERD (gastroesophageal reflux disease)   . History of prostate cancer   . HLD (hyperlipidemia)   . HOH (hard of hearing)   . Insomnia   . Oxygen deficit    2L HS AND PRN  . Primary cancer of right lung ([place]) 2017   rad tx's.  . Prostate cancer ([place]) 2006   Rad seed tx's.  . Stroke ([place])    2000  . Urothelial carcinoma of bladder ([place]) 10/2017   TURP to Prostate and chemo tx's.    SOCIAL HX:  Social History   Tobacco Use  .  Smoking status: Former Smoker    Packs/day: 0.50    Years: 50.00    Pack years: 25.00    Types: Cigarettes    Quit date: 02/21/2013    Years since quitting: 5.8  . Smokeless tobacco: Never Used  . Tobacco comment: smoking cessation materials not required  Substance Use Topics  . Alcohol use: No    Alcohol/week: 0.0 standard drinks    ALLERGIES:  Allergies  Allergen Reactions  . Plaquenil [Hydroxychloroquine] Other (See Comments)    Bad dreams  . Simvastatin Other (See Comments)    Bad dreams     PERTINENT MEDICATIONS:  Outpatient Encounter Medications as of 01/02/2019  Medication Sig  . feeding supplement, ENSURE ENLIVE, (ENSURE ENLIVE) LIQD Take 237 mLs by mouth 2 (two) times daily between meals.  . Fluticasone-Umeclidin-Vilant (TRELEGY ELLIPTA) 100-62.5-25 MCG/INH AEPB Inhale 1 puff into the lungs daily.  . Hydrocortisone (GERHARDT'S BUTT CREAM) CREA Apply 1 application topically 3 (three) times daily.  . megestrol (MEGACE) 20 MG tablet Take 1 tablet (20 mg total) by mouth 2 (two) times daily.  . methotrexate (RHEUMATREX) 2.5 MG tablet Take 4 tablets by mouth every Thursday.   . nystatin cream (MYCOSTATIN) Apply 1 application topically 2 (two) times daily.  Marland Kitchen  oxyCODONE (OXY IR/ROXICODONE) 5 MG immediate release tablet Take 2 tablets (10 mg total) by mouth every 6 (six) hours as needed for severe pain.  Marland Kitchen oxyCODONE (OXYCONTIN) 10 mg 12 hr tablet Take 1 tablet (10 mg total) by mouth every 12 (twelve) hours.  Marland Kitchen oxyCODONE 10 MG TABS Take 1 tablet (10 mg total) by mouth every 4 (four) hours as needed for moderate pain or breakthrough pain (5 mg for moderate pain, 10 mg for severe pain).  . OXYGEN Place 4 L into the nose daily as needed (shortness of breath).   . polyethylene glycol (MIRALAX / GLYCOLAX) 17 g packet Take 17 g by mouth daily as needed for mild constipation or moderate constipation.  . QUEtiapine (SEROQUEL) 50 MG tablet Take 1 tablet (50 mg total) by mouth at bedtime.    No facility-administered encounter medications on file as of 01/02/2019.     PHYSICAL EXAM:   General: NAD, frail appearing, thin, pleasant male Cardiovascular: regular rate and rhythm Pulmonary: clear ant fields Abdomen: soft, nontender, + bowel sounds Extremities: no edema, no joint deformities Skin: stage 2 to lower back, Open Stage 2 to left buttocks, right hip stage 2 and areas of excoriation to penis and scrotum Neurological: Weakness but otherwise nonfocal  Regie Bunner Ihor Gully, NP

## 2019-01-03 ENCOUNTER — Other Ambulatory Visit: Payer: Self-pay

## 2019-01-05 NOTE — Progress Notes (Signed)
[place]  [phone] [phone]) [phone]  ID: Collin Gonzales OB: 01-29-1936  MR#: [id number]  QAS#:[id number]  Patient Care Team: [name], MD as PCP - General (Family Medicine) [name], MD as Referring Physician (Ophthalmology) [name], MD as Consulting Physician (Rheumatology) [name], MD as Referring Physician (Specialist) [name], MD as Consulting Physician (Urology) [name], MD as Referring Physician (Radiation Oncology)  CHIEF COMPLAINT: Locally advanced high-grade urothelial carcinoma.    INTERVAL HISTORY: Patient returns to clinic today for further evaluation and continuation of Tecentriq.  His performance status has significantly declined and he now resides at [facility].  He has had several hospital admissions in the interim.  He continues to have incontinence secondary to his known fistula.  He does not complain of pain today.  He has no neurologic complaints.  He denies any recent fevers or illnesses.  He has chronic shortness of breath, but denies any chest pain, hemoptysis, or cough.  He has no nausea, vomiting, constipation, or diarrhea.  He denies any hematuria.  Patient offers no further specific complaints today.  REVIEW OF SYSTEMS:   Review of Systems  Constitutional: Negative.  Negative for fever, malaise/fatigue and weight loss.  Respiratory: Positive for shortness of breath. Negative for cough and hemoptysis.   Cardiovascular: Negative.  Negative for chest pain and leg swelling.  Gastrointestinal: Negative.  Negative for abdominal pain, blood in stool and melena.  Genitourinary: Positive for frequency and urgency. Negative for dysuria and hematuria.  Musculoskeletal: Negative.  Negative for back pain.  Skin: Negative.  Negative for rash.  Neurological: Negative.  Negative for sensory change, focal weakness, weakness and headaches.  Psychiatric/Behavioral:  Negative.  The patient is not nervous/anxious.     As per HPI. Otherwise, a complete review of systems is negative.  PAST MEDICAL HISTORY: Past Medical History:  Diagnosis Date   Arthritis    Collagen vascular disease (HCC)    rheumatoid arthritis   COPD (chronic obstructive pulmonary disease) (HCC)    GERD (gastroesophageal reflux disease)    History of prostate cancer    HLD (hyperlipidemia)    HOH (hard of hearing)    Insomnia    Oxygen deficit    2L HS AND PRN   Primary cancer of right lung ([place]) 2017   rad tx's.   Prostate cancer ([place]) 2006   Rad seed tx's.   Stroke [facility])    2000   Urothelial carcinoma of bladder ([place]) 10/2017   TURP to Prostate and chemo tx's.    PAST SURGICAL HISTORY: Past Surgical History:  Procedure Laterality Date   APPENDECTOMY  1965   CATARACT EXTRACTION W/PHACO Right 01/05/2016   Procedure: CATARACT EXTRACTION PHACO AND INTRAOCULAR LENS PLACEMENT ([place]);  Surgeon: [name], MD;  Location: [facility] ORS;  Service: Ophthalmology;  Laterality: Right;  [place] 1.14 AP% 18.1 CDE 13.44 FLUID PACK LOT # [id number]   CYSTOSCOPY W/ RETROGRADES Bilateral 11/01/2017   Procedure: CYSTOSCOPY WITH RETROGRADE PYELOGRAM;  Surgeon: Hollice Espy, MD;  Location: [facility] ORS;  Service: Urology;  Laterality: Bilateral;   ENDOBRONCHIAL ULTRASOUND N/A 02/26/2015   Procedure: ENDOBRONCHIAL ULTRASOUND;  Surgeon: Flora Lipps, MD;  Location: [facility] ORS;  Service: Cardiopulmonary;  Laterality: N/A;   [place] RADIATION SEED  2002   PORTA CATH INSERTION N/A 05/14/2018   Procedure: PORTA CATH INSERTION;  Surgeon: Algernon Huxley, MD;  Location: [place] [facility];  Service: Cardiovascular;  Laterality:  N/A;   TRANSURETHRAL RESECTION OF PROSTATE N/A 11/01/2017   Procedure: TRANSURETHRAL RESECTION OF THE PROSTATE (TURP) (channel TURP);  Surgeon: Hollice Espy, MD;  Location: [facility] ORS;  Service: Urology;  Laterality: N/A;     FAMILY HISTORY: Family History  Problem Relation Age of Onset   Cancer Mother    Cancer Father        Lung Cancer   Cancer Sister        breast   Cancer Brother        stomach    ADVANCED DIRECTIVES (Y/N):  N  HEALTH MAINTENANCE: Social History   Tobacco Use   Smoking status: Former Smoker    Packs/day: 0.50    Years: 50.00    Pack years: 25.00    Types: Cigarettes    Quit date: 02/21/2013    Years since quitting: 5.8   Smokeless tobacco: Never Used   Tobacco comment: smoking cessation materials not required  Substance Use Topics   Alcohol use: No    Alcohol/week: 0.0 standard drinks   Drug use: No     Colonoscopy:  PAP:  Bone density:  Lipid panel:  Allergies  Allergen Reactions   Plaquenil [Hydroxychloroquine] Other (See Comments)    Bad dreams   Simvastatin Other (See Comments)    Bad dreams    Current Outpatient Medications  Medication Sig Dispense Refill   feeding supplement, ENSURE ENLIVE, (ENSURE ENLIVE) LIQD Take 237 mLs by mouth 2 (two) times daily between meals. 237 mL 12   Fluticasone-Umeclidin-Vilant (TRELEGY ELLIPTA) 100-62.5-25 MCG/INH AEPB Inhale 1 puff into the lungs daily. 60 each 5   Hydrocortisone (GERHARDT'S BUTT CREAM) CREA Apply 1 application topically 3 (three) times daily. 1 each 0   megestrol (MEGACE) 20 MG tablet Take 1 tablet (20 mg total) by mouth 2 (two) times daily. 180 tablet 1   methotrexate (RHEUMATREX) 2.5 MG tablet Take 4 tablets by mouth every Thursday.      nystatin cream (MYCOSTATIN) Apply 1 application topically 2 (two) times daily. 30 g 0   oxyCODONE (OXY IR/ROXICODONE) 5 MG immediate release tablet Take 2 tablets (10 mg total) by mouth every 6 (six) hours as needed for severe pain. 30 tablet 0   oxyCODONE (OXYCONTIN) 10 mg 12 hr tablet Take 1 tablet (10 mg total) by mouth every 12 (twelve) hours. 10 tablet 0   oxyCODONE 10 MG TABS Take 1 tablet (10 mg total) by mouth every 4 (four) hours as needed  for moderate pain or breakthrough pain (5 mg for moderate pain, 10 mg for severe pain). 20 tablet 0   OXYGEN Place 4 L into the nose daily as needed (shortness of breath).      polyethylene glycol (MIRALAX / GLYCOLAX) 17 g packet Take 17 g by mouth daily as needed for mild constipation or moderate constipation. 14 each 0   QUEtiapine (SEROQUEL) 50 MG tablet Take 1 tablet (50 mg total) by mouth at bedtime. 90 tablet 0   Current Facility-Administered Medications  Medication Dose Route Frequency Provider Last Rate Last Dose   heparin lock flush 100 unit/mL  500 Units Intracatheter Once PRN Lloyd Huger, MD       Facility-Administered Medications Ordered in Other Visits  Medication Dose Route Frequency Provider Last Rate Last Dose   heparin lock flush 100 unit/mL  500 Units Intravenous Once Lloyd Huger, MD       sodium chloride flush (NS) 0.9 % injection 10 mL  10 mL Intravenous PRN [name],  Kathlene November, MD   10 mL at 01/10/19 0842    OBJECTIVE: Vitals:   01/10/19 0953  BP: (!) 140/127  Pulse: 82  Resp: 18  Temp: (!) 96.3 F (35.7 C)     There is no height or weight on file to calculate BMI.    ECOG FS:0 - Asymptomatic  General: Thin, no acute distress.  Sitting in a wheelchair. Eyes: Pink conjunctiva, anicteric sclera. HEENT: Normocephalic, moist mucous membranes, clear oropharnyx. Lungs: Clear to auscultation bilaterally. Heart: Regular rate and rhythm. No rubs, murmurs, or gallops. Abdomen: Soft, nontender, nondistended. No organomegaly noted. Musculoskeletal: No edema, cyanosis, or clubbing. Neuro: Alert, answering all questions appropriately. Cranial nerves grossly intact. Skin: No rashes or petechiae noted. Psych: Normal affect.  LAB RESULTS:  Lab Results  Component Value Date   NA 141 12/12/2018   K 4.7 12/12/2018   CL 109 12/12/2018   CO2 25 12/12/2018   GLUCOSE 96 12/12/2018   BUN 41 (H) 12/12/2018   CREATININE 0.95 12/18/2018   CALCIUM 8.8 (L)  12/12/2018   PROT 8.0 12/11/2018   ALBUMIN 2.4 (L) 12/11/2018   AST 33 12/11/2018   ALT 13 12/11/2018   ALKPHOS 49 12/11/2018   BILITOT 0.4 12/11/2018   GFRNONAA >60 12/18/2018   GFRAA >60 12/18/2018    Lab Results  Component Value Date   WBC 8.1 12/12/2018   NEUTROABS 7.9 (H) 12/11/2018   HGB 8.6 (L) 12/12/2018   HCT 28.6 (L) 12/12/2018   MCV 93.2 12/12/2018   PLT 273 12/12/2018     STUDIES: Dg Chest Port 1 View  Result Date: 12/11/2018 CLINICAL DATA:  Fever EXAM: PORTABLE CHEST 1 VIEW COMPARISON:  October 28, 2018 FINDINGS: There is underlying emphysematous change with fibrosis throughout the lower lobes. There is patchy infiltrate superimposed on fibrosis in the right base. The heart size is normal. The pulmonary vascularity reflects the underlying emphysematous change. No adenopathy. Port-A-Cath tip is in the superior vena cava. There is a skin fold on the left but no evident pneumothorax. No bone lesions. IMPRESSION: Airspace opacity consistent with pneumonia in the right base, superimposed on emphysematous change in fibrosis. Heart size within normal limits. Stable pulmonary vascularity. No adenopathy evident. Port-A-Cath tip in superior vena cava.  Skin fold on left. Followup PA and lateral chest radiographs recommended in 3-4 weeks following trial of antibiotic therapy to ensure resolution and exclude underlying malignancy. Electronically Signed   By: Lowella Grip III M.D.   On: 12/11/2018 19:55    ASSESSMENT: Locally advanced high-grade urothelial carcinoma.  PLAN:    1. Locally advanced high-grade urothelial carcinoma: Case discussed with urology, radiation oncology, as well as multidisciplinary tumor board.  Patient is not a surgical candidate nor can he receive additional XRT given his history of prostate cancer and brachii therapy seed placement.  Though he has a decent performance status, patient is frail and likely would not tolerate cisplatin based therapy.  CT  scan results from September 19, 2018 reviewed independently with no obvious evidence of recurrent or progressive disease outside patient's bladder. Previously, hospice and end-of-life were discussed but patient is not interested at this time.  Despite evidence of fistula, patient is not a surgical candidate therefore will continue to monitor with routine CT scans.  Plan was to reimage prior to today's appointment, but patient was unable to make his CT scan.  We will discontinue treatment at this time given his declining performance status.  Reschedule CT scan in the next 1 to  2 weeks and will have video assisted telemedicine visit 1 to 2 days later.   2.  Right upper lobe lung mass: Previously, although biopsy was negative, patient had a positive PET scan that was highly suspicious for underlying malignancy. He underwent SBRT in approximately May 2017.   3.  Shortness of breath: Chronic and unchanged.  Patient now requires oxygen 24 hours/day although is admittedly noncompliant. 4.  Anemia: Chronic and unchanged.  Patient's most recent hemoglobin is 8.6, unable to get laboratory work today.  Monitor. 5.  Hematuria: Patient does not complain of this today.  Appreciate urology input. 6.  Renal insufficiency: Resolved. 7.  Leukocytosis: Resolved. 8.  Pain: Continue oxycodone 10 mg every 4 hours as needed.    Patient expressed understanding and was in agreement with this plan. He also understands that He can call clinic at any time with any questions, concerns, or complaints.    Lloyd Huger, MD   01/10/2019 11:59 AM

## 2019-01-08 ENCOUNTER — Other Ambulatory Visit: Payer: Self-pay

## 2019-01-08 DIAGNOSIS — C679 Malignant neoplasm of bladder, unspecified: Secondary | ICD-10-CM

## 2019-01-09 ENCOUNTER — Non-Acute Institutional Stay: Payer: Medicare Other | Admitting: Nurse Practitioner

## 2019-01-09 ENCOUNTER — Other Ambulatory Visit: Payer: Self-pay

## 2019-01-09 ENCOUNTER — Encounter: Payer: Self-pay | Admitting: Nurse Practitioner

## 2019-01-09 VITALS — BP 102/58 | HR 75 | Temp 98.2°F | Resp 18 | Wt 94.4 lb

## 2019-01-09 DIAGNOSIS — Z515 Encounter for palliative care: Secondary | ICD-10-CM

## 2019-01-09 DIAGNOSIS — C3490 Malignant neoplasm of unspecified part of unspecified bronchus or lung: Secondary | ICD-10-CM

## 2019-01-09 NOTE — Progress Notes (Signed)
[place] Consult Note Telephone: [phone]  Fax: [phone]  PATIENT NAME: Collin Gonzales DOB: 1935-11-12 MRN: [id number]  PRIMARY CARE PROVIDER:   [name], MD  Dr Hodges/[place]:   Self  RECOMMENDATIONS and PLAN:  1. ACP : DNR; continue with oncology  2. Anorexia appetite remaining declined. Continue to encourage supplements and comfort feedings. Discuss nutrition  3.Palliative care encounter Palliative medicine team will continue to support patient, patient's family, and medical team. Visit consisted of counseling and education dealing with the complex and emotionally intense issues of symptom management and palliative care in the setting of serious and potentially life-threatening illness  I spent 45 minutes providing this consultation,  from 1:45pm to 2:30pm. More than 50% of the time in this consultation was spent coordinating communication.   HISTORY OF PRESENT ILLNESS:  Collin Gonzales is a 83 y.o. year old male with multiple medical problems including Lung cancer s/p radiation (2017), Urothelial carcinoma bladder/prostate cancer s/p turp with rad seed to prostate in chemotherapy, stroke with hemiparesis(2000), COPD, O2 dependent, atrial fibrillation, aortic atherosclerosis, collagen vascular disease, rheumatory arthritis, gerd, hyperlipidemia, hard of hearing, insomnia, thrombocytopenia, iron deficiency anemia, protein calorie malnutrition, porta cath insertion, hernia repair, appendectomy and, cataracts extraction with/phaco. Collin Gonzales continues to reside at [facility] at [facility]. Collin Gonzales continues to attempt to work with therapy though increase in weakness. Collin. Gonzales does require assistance for adl's. Collin. Gonzales is able to feed himself after tray setup. Appetite continues to be declined. He does remain on oxygen continuous. He is able to  verbalize his needs. Collin. Gonzales is a DNR. I visited and observed Collin. Gonzales. We talked about purpose of palliative care visit and Collin Gonzales in agreement. We talked about how he was feeling today. Collin Gonzales endorses that he is weak. We talked about symptoms of pain when she denies. We talked about shortness of breath which he shares that comes and goes. Collin. Gonzales endorses that the oxygen does help. We talked about his goals with short-term rehab and therapy. He showed that it's more difficult for him to walk because of the shortness of breath in the weakness. We talked about realistic goals and expectations. We talked about medical goals of care. Discuss the role of palliative care and plan of care. We talked about continuing to follow during short-term rehab with ongoing discussions of medical goals of care, discharge planning. I have attempted to contact his daughter, will continue. DNR does remain in place. Therapeutic listening and emotional support provided. Contact information provided. Questions answered to satisfaction. I updated nursing staff  Palliative Care was asked to help to continue to address goals of care.   CODE STATUS: DNR  PPS: 5 HOSPICE ELIGIBILITY/DIAGNOSIS: TBD  PAST MEDICAL HISTORY:  Past Medical History:  Diagnosis Date  . Arthritis   . Collagen vascular disease (HCC)    rheumatoid arthritis  . COPD (chronic obstructive pulmonary disease) ([place])   . GERD (gastroesophageal reflux disease)   . History of prostate cancer   . HLD (hyperlipidemia)   . HOH (hard of hearing)   . Insomnia   . Oxygen deficit    2L HS AND PRN  . Primary cancer of right lung ([place]) 2017   rad tx's.  . Prostate cancer ([place]) 2006   Rad seed tx's.  . Stroke ([place])    2000  . Urothelial carcinoma of bladder ([place]) 10/2017  TURP to Prostate and chemo tx's.    SOCIAL HX:  Social History   Tobacco Use  . Smoking status: Former Smoker    Packs/day: 0.50    Years: 50.00    Pack  years: 25.00    Types: Cigarettes    Quit date: 02/21/2013    Years since quitting: 5.8  . Smokeless tobacco: Never Used  . Tobacco comment: smoking cessation materials not required  Substance Use Topics  . Alcohol use: No    Alcohol/week: 0.0 standard drinks    ALLERGIES:  Allergies  Allergen Reactions  . Plaquenil [Hydroxychloroquine] Other (See Comments)    Bad dreams  . Simvastatin Other (See Comments)    Bad dreams     PERTINENT MEDICATIONS:  Outpatient Encounter Medications as of 01/09/2019  Medication Sig  . feeding supplement, ENSURE ENLIVE, (ENSURE ENLIVE) LIQD Take 237 mLs by mouth 2 (two) times daily between meals.  . Fluticasone-Umeclidin-Vilant (TRELEGY ELLIPTA) 100-62.5-25 MCG/INH AEPB Inhale 1 puff into the lungs daily.  . Hydrocortisone (GERHARDT'S BUTT CREAM) CREA Apply 1 application topically 3 (three) times daily.  . megestrol (MEGACE) 20 MG tablet Take 1 tablet (20 mg total) by mouth 2 (two) times daily.  . methotrexate (RHEUMATREX) 2.5 MG tablet Take 4 tablets by mouth every Thursday.   . nystatin cream (MYCOSTATIN) Apply 1 application topically 2 (two) times daily.  Marland Kitchen oxyCODONE (OXY IR/ROXICODONE) 5 MG immediate release tablet Take 2 tablets (10 mg total) by mouth every 6 (six) hours as needed for severe pain.  Marland Kitchen oxyCODONE (OXYCONTIN) 10 mg 12 hr tablet Take 1 tablet (10 mg total) by mouth every 12 (twelve) hours.  Marland Kitchen oxyCODONE 10 MG TABS Take 1 tablet (10 mg total) by mouth every 4 (four) hours as needed for moderate pain or breakthrough pain (5 mg for moderate pain, 10 mg for severe pain).  . OXYGEN Place 4 L into the nose daily as needed (shortness of breath).   . polyethylene glycol (MIRALAX / GLYCOLAX) 17 g packet Take 17 g by mouth daily as needed for mild constipation or moderate constipation.  . QUEtiapine (SEROQUEL) 50 MG tablet Take 1 tablet (50 mg total) by mouth at bedtime.   No facility-administered encounter medications on file as of 01/09/2019.      PHYSICAL EXAM:   General: NAD, frail appearing, thin elderly male Cardiovascular: regular rate and rhythm Pulmonary: clear ant fields Abdomen: soft, nontender, + bowel sounds Extremities: no edema, no joint deformities Neurological: generalized weakness   Ihor Gully, NP

## 2019-01-10 ENCOUNTER — Inpatient Hospital Stay: Payer: Medicare Other | Admitting: Hospice and Palliative Medicine

## 2019-01-10 ENCOUNTER — Other Ambulatory Visit: Payer: Self-pay

## 2019-01-10 ENCOUNTER — Inpatient Hospital Stay (HOSPITAL_BASED_OUTPATIENT_CLINIC_OR_DEPARTMENT_OTHER): Payer: Medicare Other | Admitting: Oncology

## 2019-01-10 ENCOUNTER — Inpatient Hospital Stay: Payer: Medicare Other

## 2019-01-10 ENCOUNTER — Inpatient Hospital Stay: Payer: Medicare Other | Attending: Oncology

## 2019-01-10 ENCOUNTER — Encounter: Payer: Self-pay | Admitting: Oncology

## 2019-01-10 VITALS — BP 140/127 | HR 82 | Temp 96.3°F | Resp 18

## 2019-01-10 DIAGNOSIS — Z8673 Personal history of transient ischemic attack (TIA), and cerebral infarction without residual deficits: Secondary | ICD-10-CM | POA: Diagnosis not present

## 2019-01-10 DIAGNOSIS — E785 Hyperlipidemia, unspecified: Secondary | ICD-10-CM | POA: Diagnosis not present

## 2019-01-10 DIAGNOSIS — Z9079 Acquired absence of other genital organ(s): Secondary | ICD-10-CM | POA: Insufficient documentation

## 2019-01-10 DIAGNOSIS — J449 Chronic obstructive pulmonary disease, unspecified: Secondary | ICD-10-CM | POA: Diagnosis not present

## 2019-01-10 DIAGNOSIS — Z801 Family history of malignant neoplasm of trachea, bronchus and lung: Secondary | ICD-10-CM | POA: Diagnosis not present

## 2019-01-10 DIAGNOSIS — Z8546 Personal history of malignant neoplasm of prostate: Secondary | ICD-10-CM | POA: Diagnosis not present

## 2019-01-10 DIAGNOSIS — R918 Other nonspecific abnormal finding of lung field: Secondary | ICD-10-CM | POA: Diagnosis not present

## 2019-01-10 DIAGNOSIS — M069 Rheumatoid arthritis, unspecified: Secondary | ICD-10-CM | POA: Diagnosis not present

## 2019-01-10 DIAGNOSIS — D649 Anemia, unspecified: Secondary | ICD-10-CM | POA: Insufficient documentation

## 2019-01-10 DIAGNOSIS — C679 Malignant neoplasm of bladder, unspecified: Secondary | ICD-10-CM

## 2019-01-10 DIAGNOSIS — Z87891 Personal history of nicotine dependence: Secondary | ICD-10-CM | POA: Diagnosis not present

## 2019-01-10 DIAGNOSIS — Z79899 Other long term (current) drug therapy: Secondary | ICD-10-CM | POA: Diagnosis not present

## 2019-01-10 MED ORDER — SODIUM CHLORIDE 0.9% FLUSH
10.0000 mL | INTRAVENOUS | Status: AC | PRN
  Administered 2019-01-10: 10 mL via INTRAVENOUS
  Filled 2019-01-10: qty 10

## 2019-01-10 MED ORDER — HEPARIN SOD (PORK) LOCK FLUSH 100 UNIT/ML IV SOLN: 500.0000 [IU] | Freq: Once | INTRAVENOUS | Status: DC | PRN

## 2019-01-10 MED ORDER — HEPARIN SOD (PORK) LOCK FLUSH 100 UNIT/ML IV SOLN
500.0000 [IU] | Freq: Once | INTRAVENOUS | Status: AC
  Filled 2019-01-10: qty 5

## 2019-01-11 IMAGING — DX DG CHEST 1V PORT
1 series · 2 of 2 positions shown · non-contrast
Comparison: CT 07/25/2016.  Radiographs 04/08/2016.

CLINICAL DATA: Generalized weakness. Cough and runny nose. On home
oxygen.

EXAM:
PORTABLE CHEST 1 VIEW

[Series 2: chest ap · 0.14mm/px · 2 of 2 slices shown]
[im 1/2]
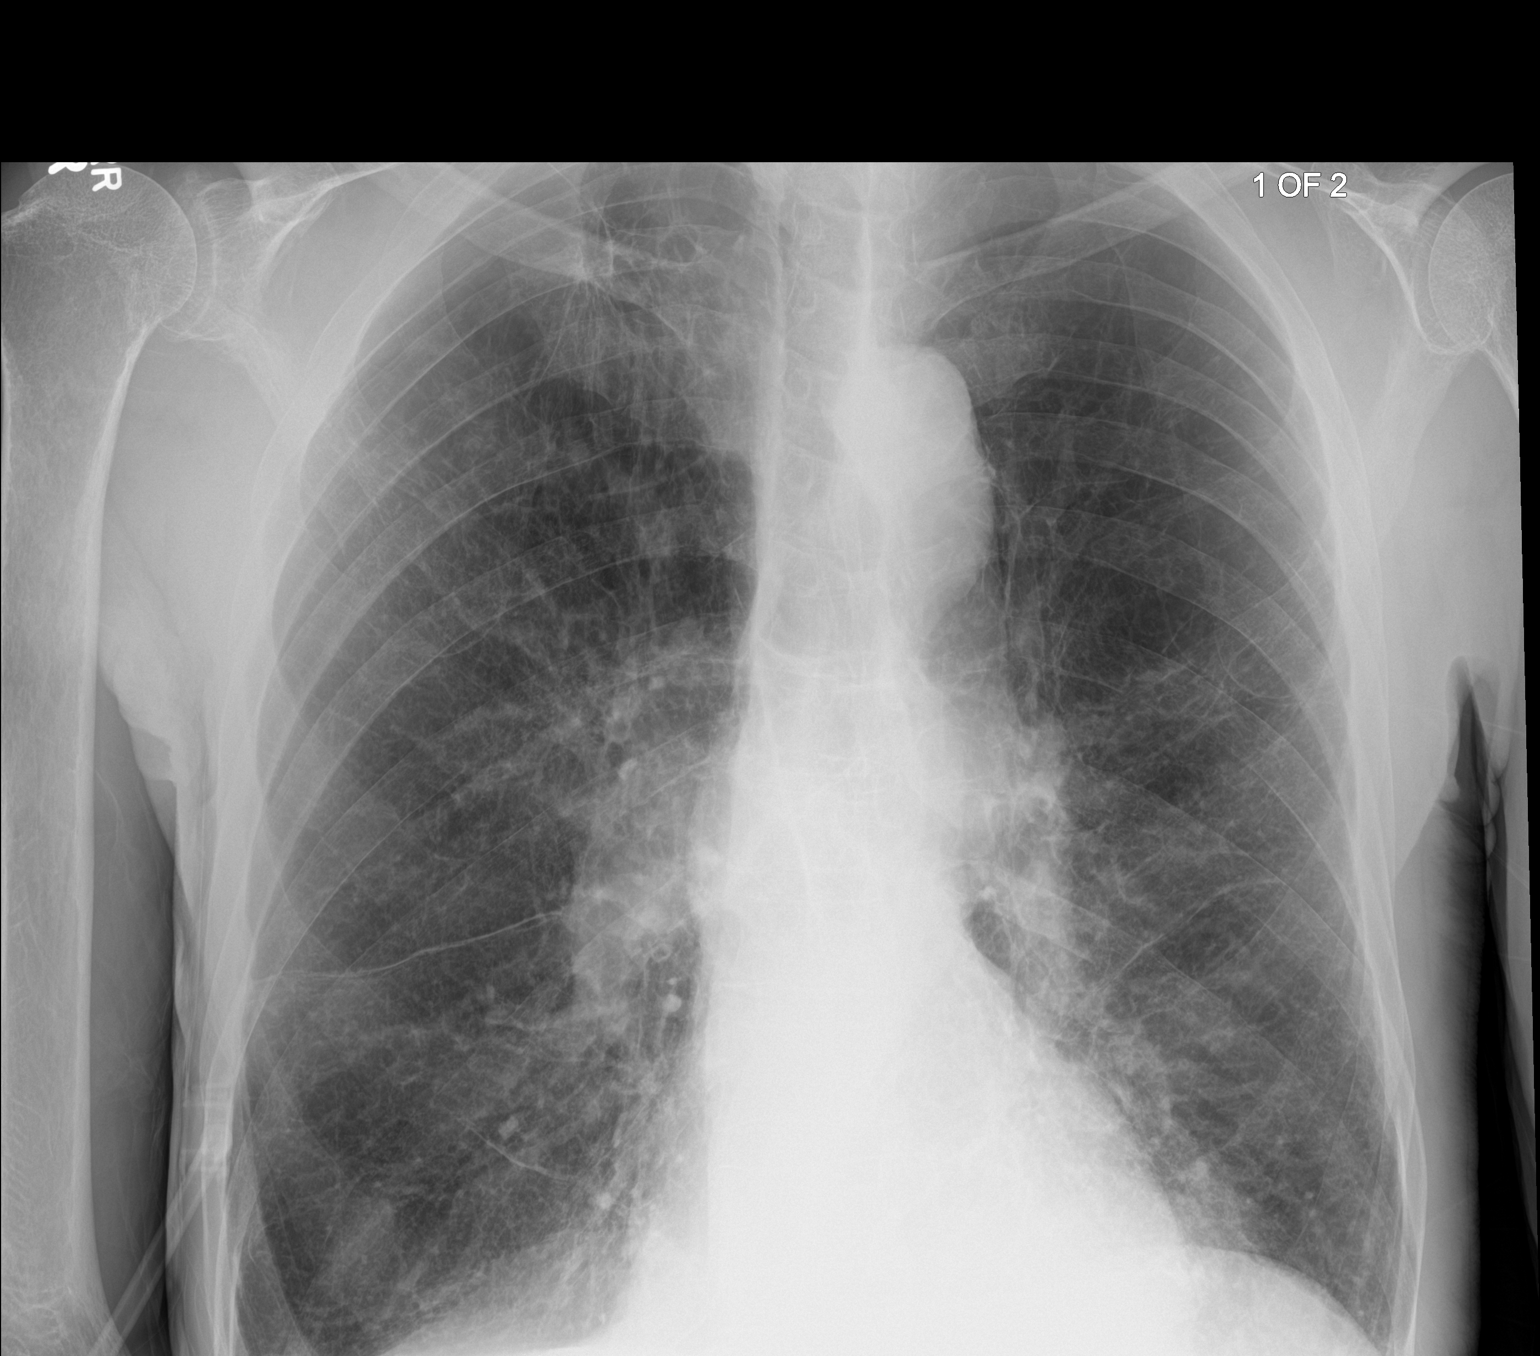
[im 2/2]
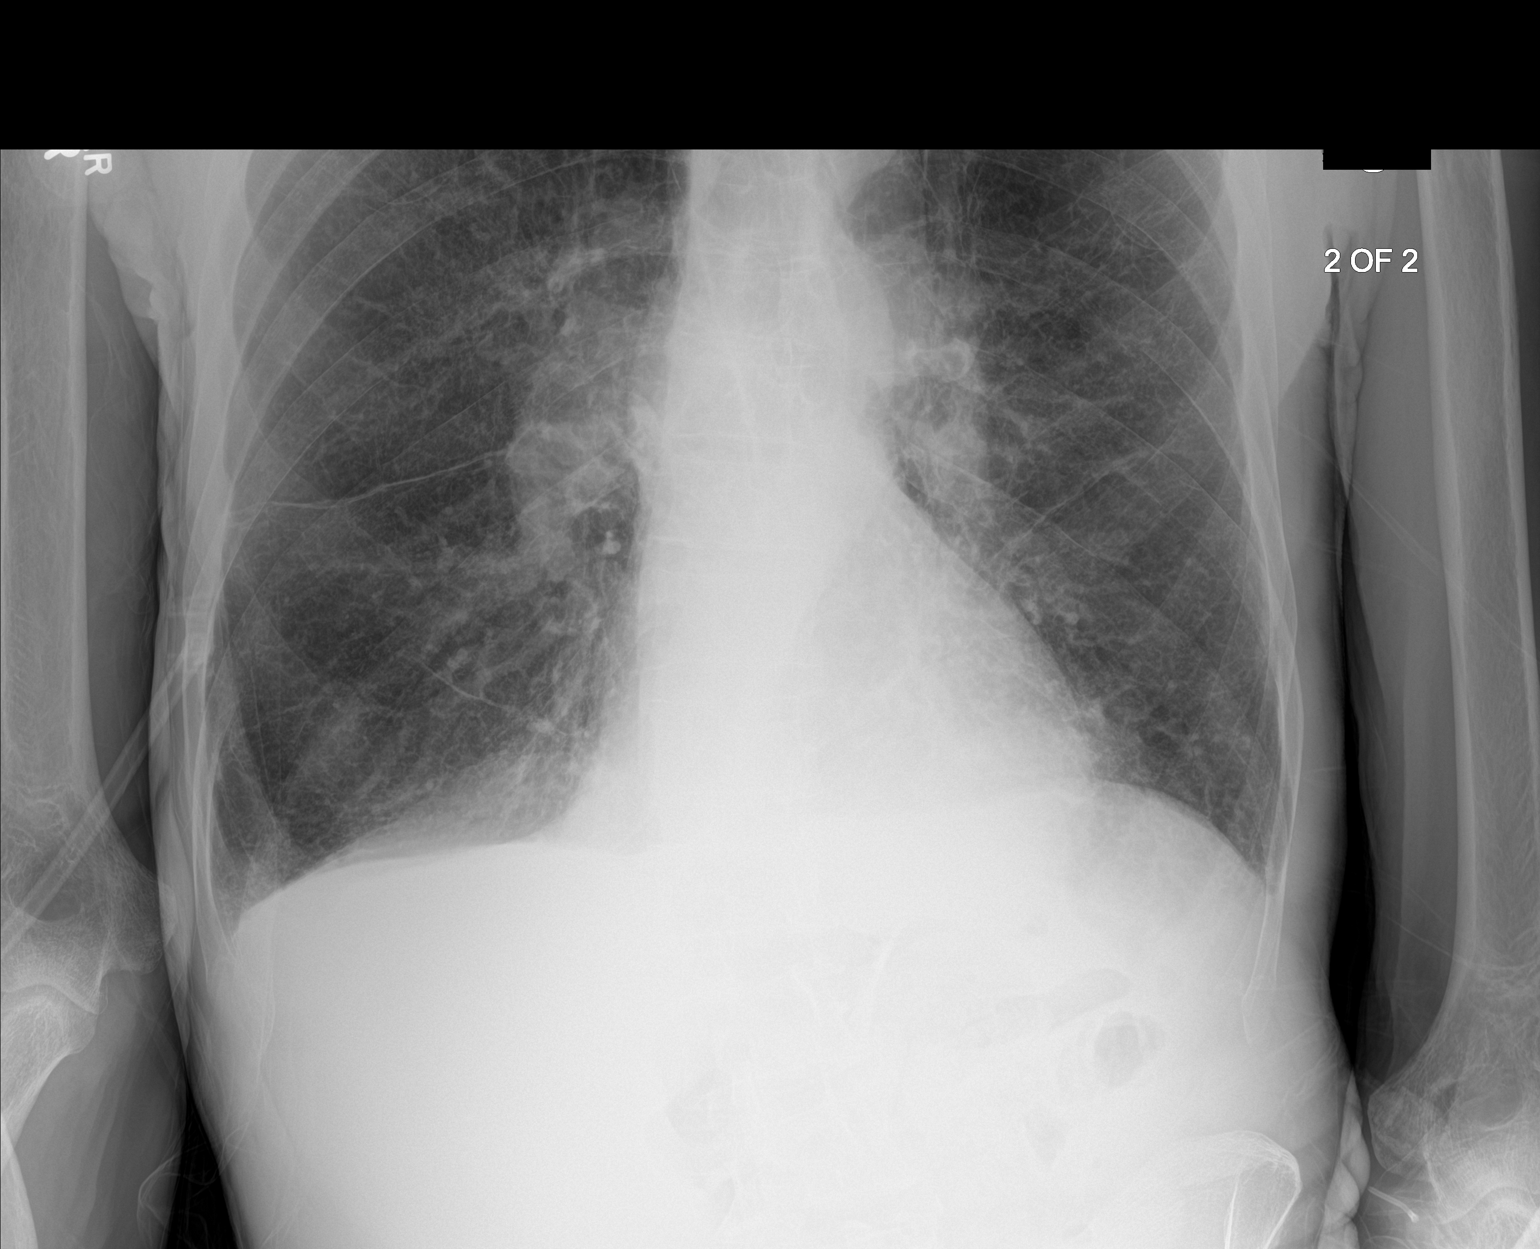

[2 of 2 positions shown; findings below may reference images not displayed]

FINDINGS: 6858 hr. Two views obtained. The heart size and mediastinal contours
are stable. There is severe emphysema with stable asymmetric right
apical scarring. There is increased density in the retrocardiac left
lower lobe which could reflect pneumonia. No evidence of
pneumothorax or significant pleural effusion. The bones appear
unchanged.
IMPRESSION: Increased retrocardiac left lower lobe opacity compared with prior
studies, suspicious for pneumonia, possibly on the basis of
aspiration. Severe emphysema.

## 2019-01-13 IMAGING — CR DG CHEST 2V
1 series · 2 of 2 positions shown · non-contrast
Comparison: 03/01/2017

CLINICAL DATA: Hypoxia.  Pneumonia.  COPD.  Lung cancer.

EXAM:
CHEST  2 VIEW

[Series 1: dg chest 2 view · 0.14mm/px · 2 of 2 slices shown]
[im 1/2]
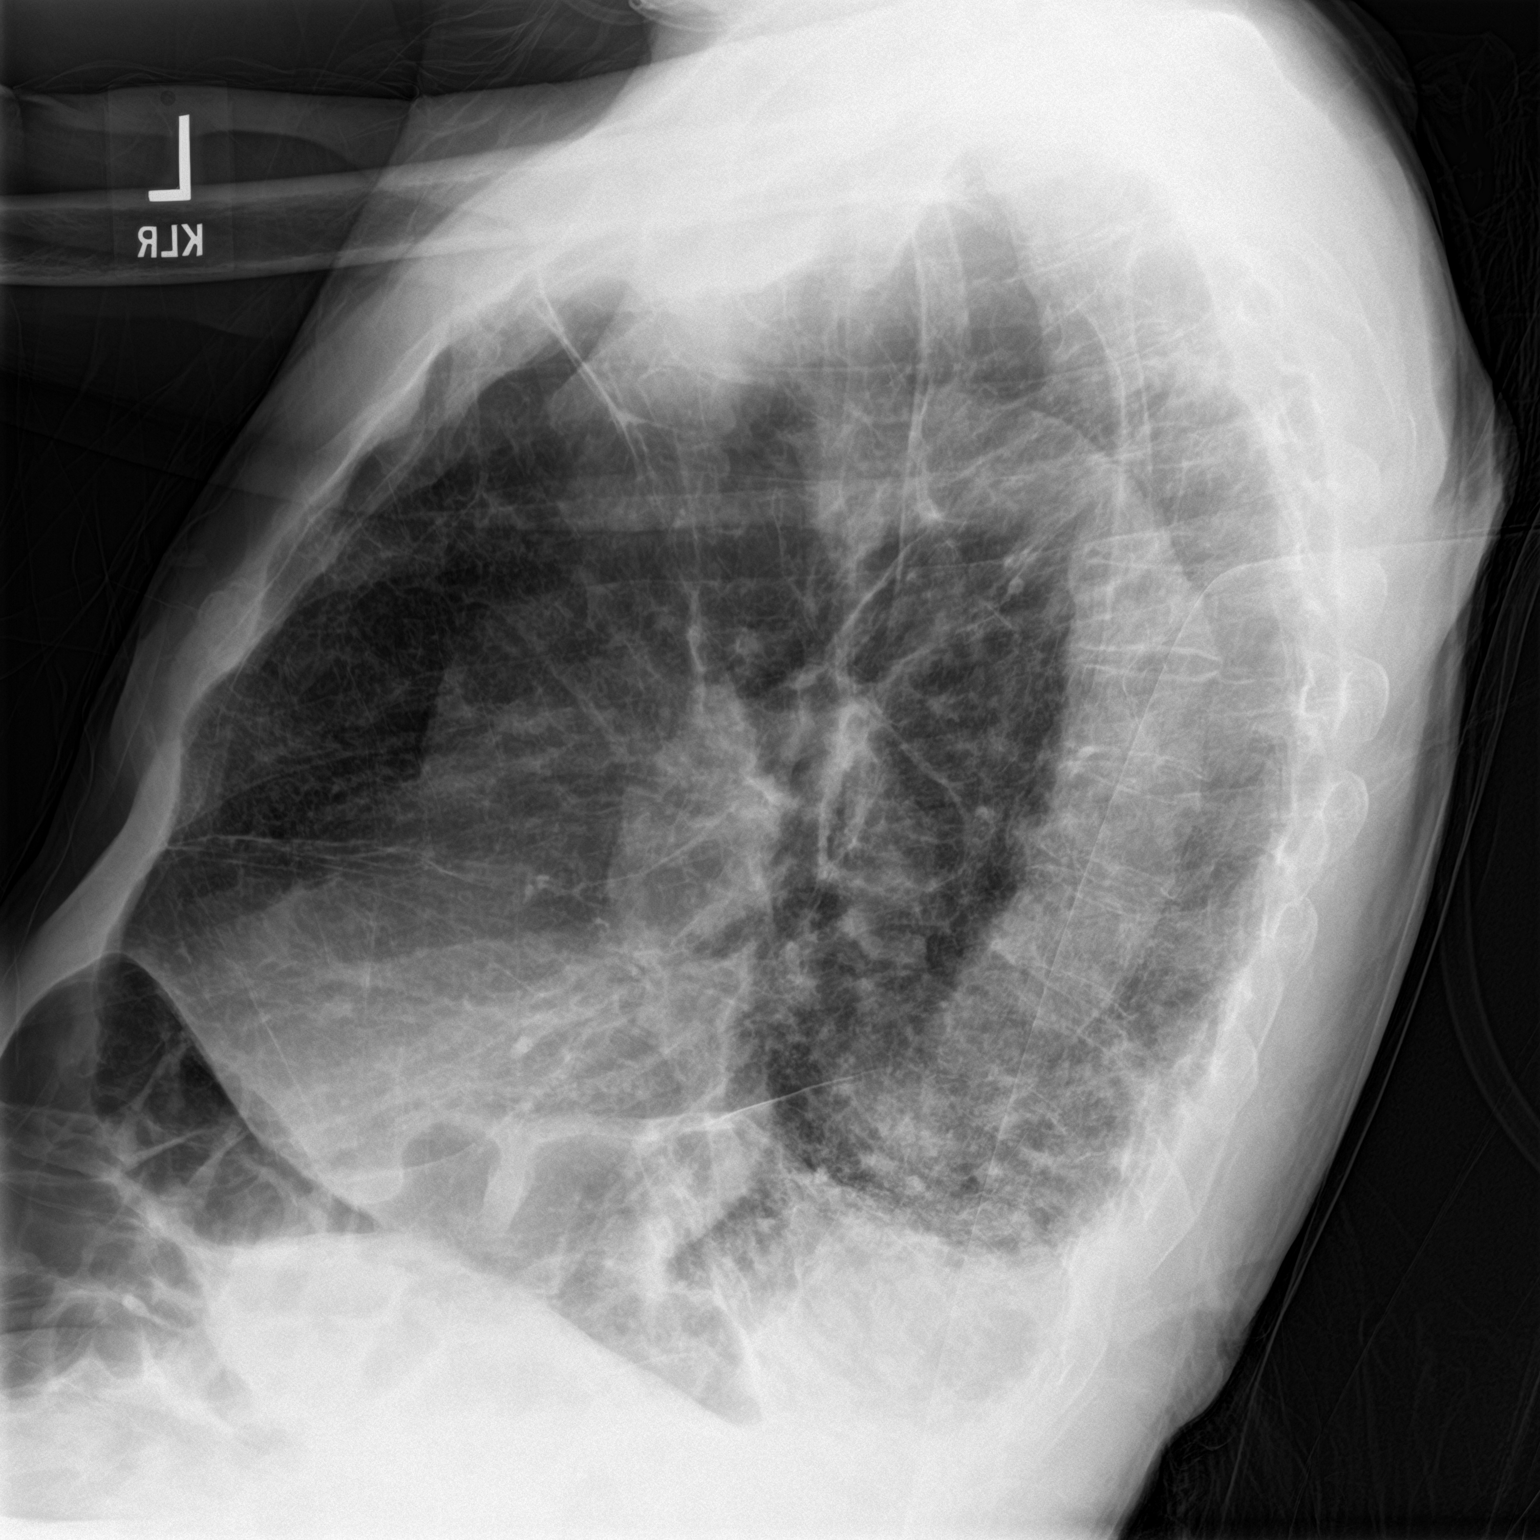
[im 2/2]
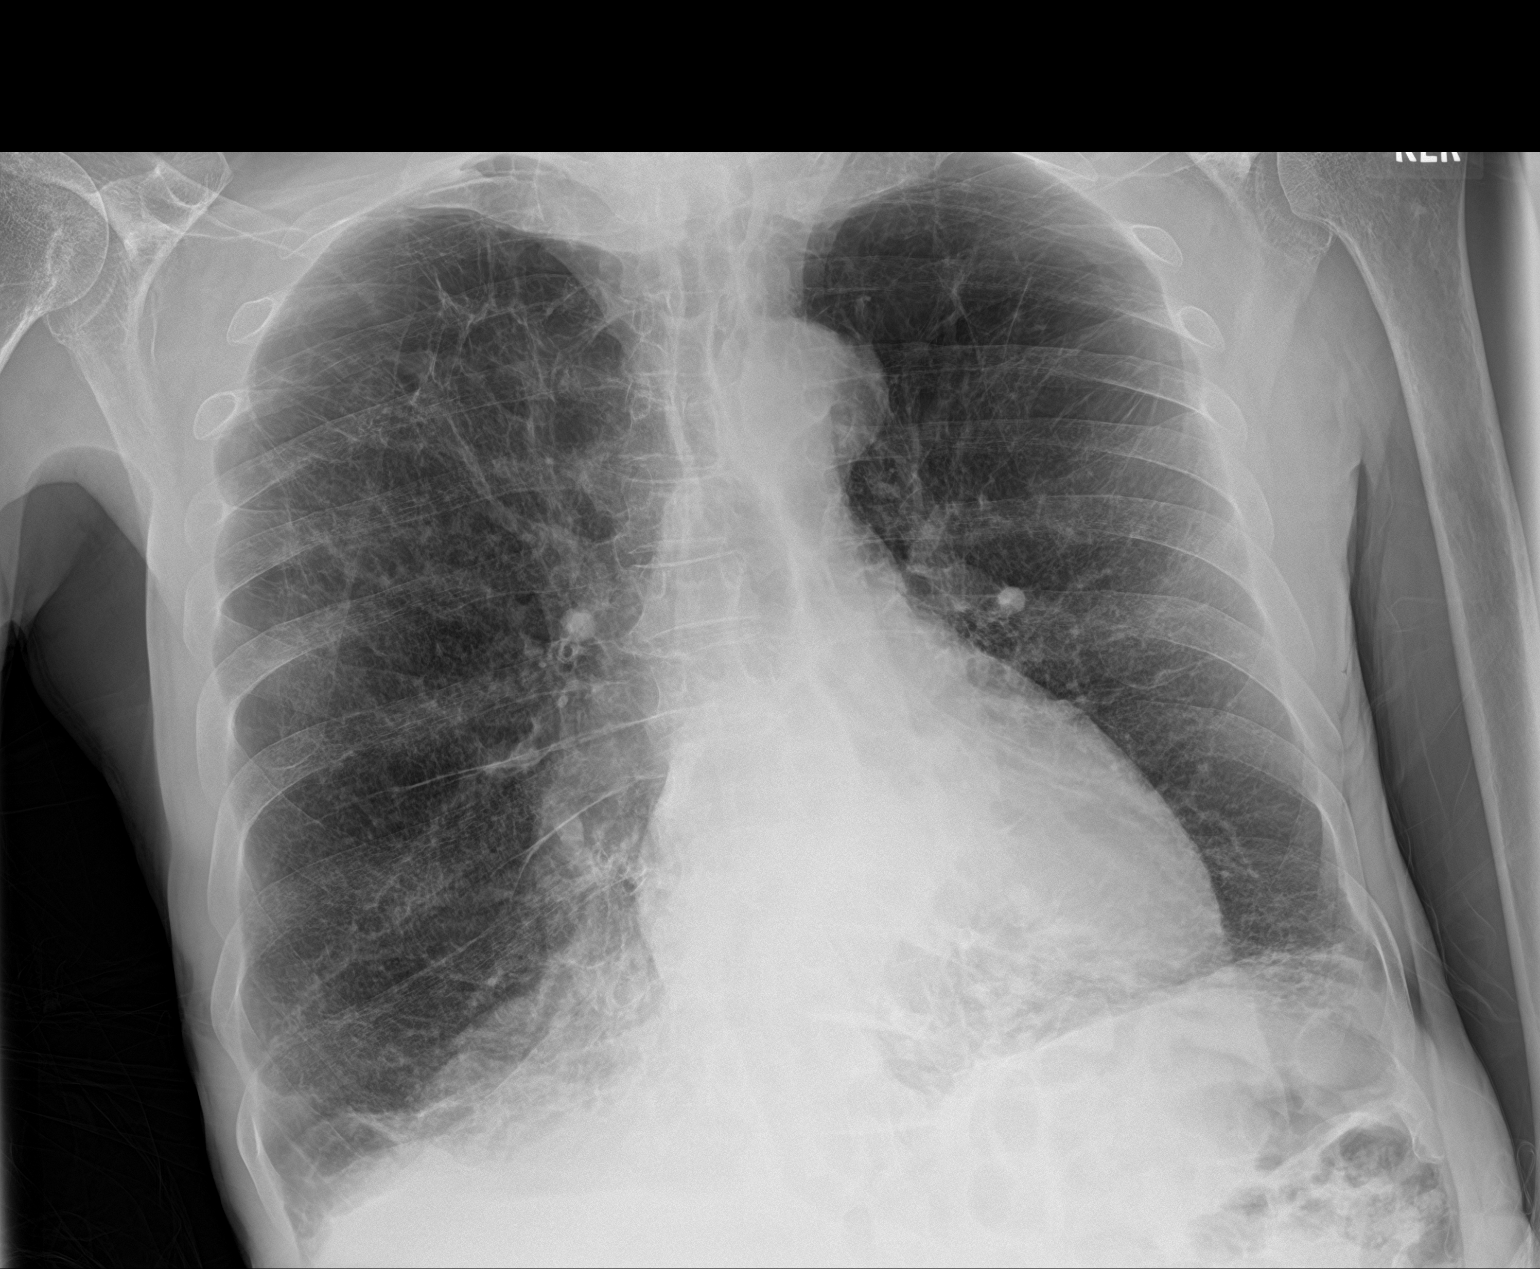

[2 of 2 positions shown; findings below may reference images not displayed]

FINDINGS: Artifact degraded lateral view, primarily posteriorly.
Hyperinflation. Midline trachea. Normal heart size. Tortuous
thoracic aorta. No pleural effusion or pneumothorax. Biapical
scarring. Persistent left lower lobe airspace disease which may be
slightly improved. Worsened medial right lower lobe airspace
disease, especially when compared to the remote exam of 04/08/2016.
IMPRESSION: Persistent (possibly improved) left and progressive right base
airspace disease, suspicious for pneumonia or aspiration.

Underlying hyperinflation, consistent with COPD.

## 2019-01-14 DIAGNOSIS — C689 Malignant neoplasm of urinary organ, unspecified: Secondary | ICD-10-CM | POA: Diagnosis not present

## 2019-01-14 IMAGING — DX DG CHEST 1V PORT
1 series · 1 of 1 positions shown · non-contrast
Comparison: 03/03/2017

CLINICAL DATA: Encounter for SOB. Patient denies SOB, CP, cough or
fever at this time. Hx stroke, lung ca, COPD. Former smoker.

EXAM:
PORTABLE CHEST 1 VIEW

[chest ap]
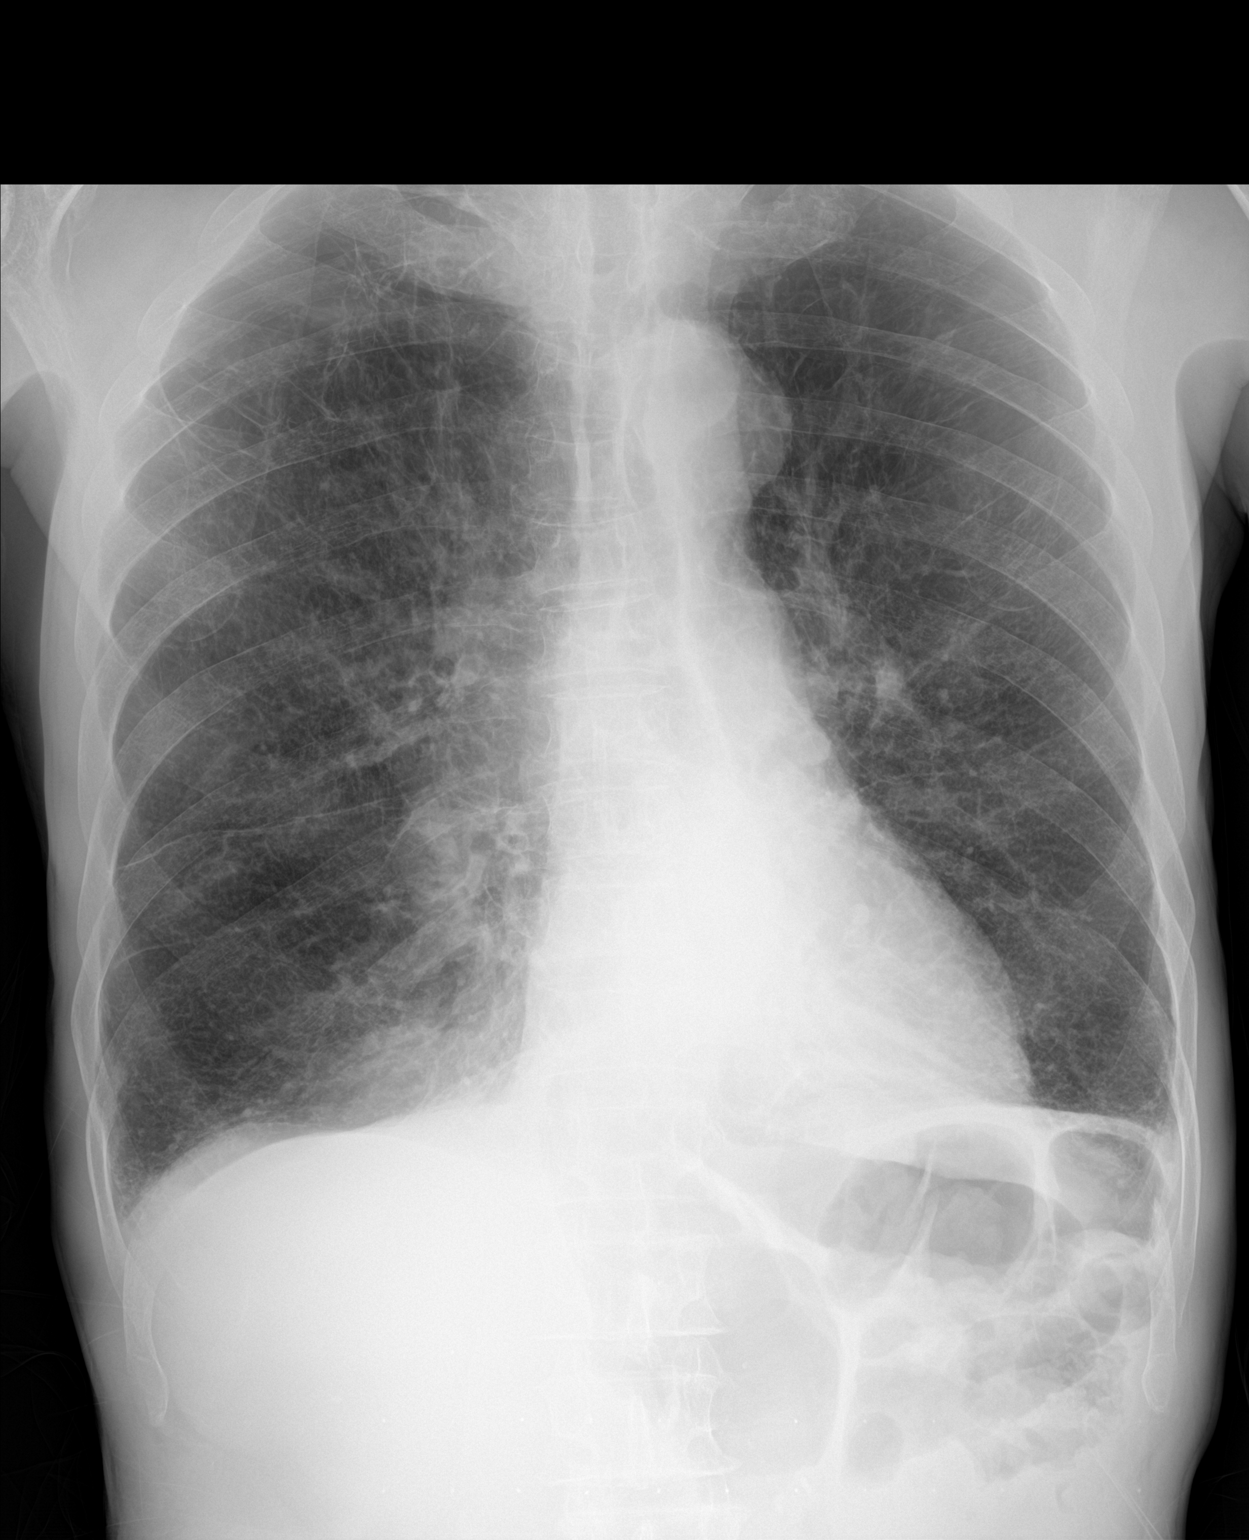

[1 of 1 positions shown; findings below may reference images not displayed]

FINDINGS: Hyperinflation. Midline trachea. Normal heart size. No pleural
effusion or pneumothorax. Right greater than left apical scarring.
Persistent medial lower lobe airspace disease bilaterally. Felt to
be minimally improved.
IMPRESSION: Minimal improvement in bibasilar airspace disease, likely due to
decreased pneumonia.

Underlying hyperinflation consistent with COPD.

## 2019-01-16 ENCOUNTER — Ambulatory Visit: Payer: Medicare Other | Admitting: Urology

## 2019-01-22 NOTE — Progress Notes (Deleted)
01/16/2019 9:32 PM   Collin Gonzales 1935/07/21 [id number]  Referring provider: [name], MD [address] [place],  [place] [address]  No chief complaint on file.   HPI: Collin Gonzales is an 83 year old male with locally advanced high-grade urothelial carcinoma who presents today for follow up from the hospital.    He states he started to experience penile swelling and pain yesterday.  Nothing helps the pain.  Nothing makes the pain better.  He denies any trauma to the area.  He denies any sexual activity.  Patient denies any gross hematuria, dysuria or suprapubic/flank pain.  Patient denies any fevers, chills, nausea or vomiting.   PMH: Past Medical History:  Diagnosis Date  . Arthritis   . Collagen vascular disease (HCC)    rheumatoid arthritis  . COPD (chronic obstructive pulmonary disease) ([place])   . GERD (gastroesophageal reflux disease)   . History of prostate cancer   . HLD (hyperlipidemia)   . HOH (hard of hearing)   . Insomnia   . Oxygen deficit    2L HS AND PRN  . Primary cancer of right lung ([place]) 2017   rad tx's.  . Prostate cancer ([place]) 2006   Rad seed tx's.  . Stroke ([place])    2000  . Urothelial carcinoma of bladder ([place]) 10/2017   TURP to Prostate and chemo tx's.    Surgical History: Past Surgical History:  Procedure Laterality Date  . APPENDECTOMY  1965  . CATARACT EXTRACTION W/PHACO Right 01/05/2016   Procedure: CATARACT EXTRACTION PHACO AND INTRAOCULAR LENS PLACEMENT (IOC);  Surgeon: [name], MD;  Location: [facility];  Service: Ophthalmology;  Laterality: Right;  [place] 1.14 AP% 18.1 CDE 13.44 FLUID PACK LOT # [id number] H  . CYSTOSCOPY W/ RETROGRADES Bilateral 11/01/2017   Procedure: CYSTOSCOPY WITH RETROGRADE PYELOGRAM;  Surgeon: Hollice Espy, MD;  Location: [facility];  Service: Urology;  Laterality: Bilateral;  . ENDOBRONCHIAL ULTRASOUND N/A 02/26/2015   Procedure: ENDOBRONCHIAL ULTRASOUND;  Surgeon: Flora Lipps, MD;   Location: [facility];  Service: Cardiopulmonary;  Laterality: N/A;  . [place]  . INSERTION PROSTATE RADIATION SEED  2002  . PORTA CATH INSERTION N/A 05/14/2018   Procedure: PORTA CATH INSERTION;  Surgeon: Algernon Huxley, MD;  Location: [facility];  Service: Cardiovascular;  Laterality: N/A;  . TRANSURETHRAL RESECTION OF PROSTATE N/A 11/01/2017   Procedure: TRANSURETHRAL RESECTION OF THE PROSTATE (TURP) (channel TURP);  Surgeon: Hollice Espy, MD;  Location: [facility];  Service: Urology;  Laterality: N/A;    Home Medications:  Allergies as of 01/16/2019      Reactions   Plaquenil [hydroxychloroquine] Other (See Comments)   Bad dreams   Simvastatin Other (See Comments)   Bad dreams      Medication List       Accurate as of 02/14/19  9:32 PM. If you have any questions, ask your nurse or doctor.        feeding supplement (ENSURE ENLIVE) Liqd Take 237 mLs by mouth 2 (two) times daily between meals.   Gerhardt's butt cream Crea Apply 1 application topically 3 (three) times daily.   megestrol 20 MG tablet Commonly known as: MEGACE Take 1 tablet (20 mg total) by mouth 2 (two) times daily.   methotrexate 2.5 MG tablet Commonly known as: RHEUMATREX Take 4 tablets by mouth every Thursday.   nystatin cream Commonly known as: MYCOSTATIN Apply 1 application topically 2 (two) times daily.   oxyCODONE  5 MG immediate release tablet Commonly known as: Oxy IR/ROXICODONE Take 2 tablets (10 mg total) by mouth every 6 (six) hours as needed for severe pain.   Oxycodone HCl 10 MG Tabs Take 1 tablet (10 mg total) by mouth every 4 (four) hours as needed for moderate pain or breakthrough pain (5 mg for moderate pain, 10 mg for severe pain).   oxyCODONE 10 mg 12 hr tablet Commonly known as: OXYCONTIN Take 1 tablet (10 mg total) by mouth every 12 (twelve) hours.   OXYGEN Place 4 L into the nose daily as needed (shortness of breath).   polyethylene glycol 17 g  packet Commonly known as: MIRALAX / GLYCOLAX Take 17 g by mouth daily as needed for mild constipation or moderate constipation.   QUEtiapine 50 MG tablet Commonly known as: SEROQUEL Take 1 tablet (50 mg total) by mouth at bedtime.   Trelegy Ellipta 100-62.5-25 MCG/INH Aepb Generic drug: Fluticasone-Umeclidin-Vilant Inhale 1 puff into the lungs daily.       Allergies:  Allergies  Allergen Reactions  . Plaquenil [Hydroxychloroquine] Other (See Comments)    Bad dreams  . Simvastatin Other (See Comments)    Bad dreams    Family History: Family History  Problem Relation Age of Onset  . Cancer Mother   . Cancer Father        Lung Cancer  . Cancer Sister        breast  . Cancer Brother        stomach    Social History:  reports that he quit smoking about 5 years ago. His smoking use included cigarettes. He has a 25.00 pack-year smoking history. He has never used smokeless tobacco. He reports that he does not drink alcohol or use drugs.  ROS:                                        Physical Exam: There were no vitals taken for this visit.  Constitutional:  Well nourished. Alert and oriented, No acute distress. HEENT: [place] AT, moist mucus membranes.  Trachea midline, no masses. Cardiovascular: No clubbing, cyanosis, or edema. Respiratory: Normal respiratory effort, no increased work of breathing. GI: Abdomen is soft, non tender, non distended, no abdominal masses. Liver and spleen not palpable.  No hernias appreciated.  Stool sample for occult testing is not indicated.   GU: No CVA tenderness.  No bladder fullness or masses.  Patient with uncircumcised phallus with phimosis.  Could not retract the foreskin.  Areas of cracked skin and lichenification on the tip of the foreskin and middle of the scrotum likely due to the irritation from wearing depends.  No crepitus, no fluctuation or erythema noted.  Scrotum without lesions, cysts, rashes and/or edema.   Right > Left hydrocele noted.  Testicles are located scrotally bilaterally. No masses are appreciated in the testicles. Left and right epididymis are normal. Neurologic: Grossly intact, no focal deficits, moving all 4 extremities. Psychiatric: Normal mood and affect.  Laboratory Data: Lab Results  Component Value Date   WBC 8.1 12/12/2018   HGB 8.6 (L) 12/12/2018   HCT 28.6 (L) 12/12/2018   MCV 93.2 12/12/2018   PLT 273 12/12/2018    Lab Results  Component Value Date   CREATININE 0.95 12/18/2018    No results found for: PSA  No results found for: TESTOSTERONE  Lab Results  Component Value Date  HGBA1C 5.7 (H) 10/27/2018    Lab Results  Component Value Date   TSH 1.030 12/06/2018       Component Value Date/Time   CHOL 150 10/11/2017 1239   CHOL 152 07/08/2015 0846   HDL 51 10/11/2017 1239   HDL 43 07/08/2015 0846   CHOLHDL 2.9 10/11/2017 1239   VLDL 13 05/30/2016 1128   LDLCALC 85 10/11/2017 1239    Lab Results  Component Value Date   AST 33 12/11/2018   Lab Results  Component Value Date   ALT 13 12/11/2018   No components found for: ALKALINEPHOPHATASE No components found for: BILIRUBINTOTAL  No results found for: ESTRADIOL  Urinalysis    Component Value Date/Time   COLORURINE YELLOW (A) 12/11/2018 1954   APPEARANCEUR TURBID (A) 12/11/2018 1954   APPEARANCEUR Cloudy (A) 02/02/2018 1145   LABSPEC 1.018 12/11/2018 1954   PHURINE 7.0 12/11/2018 [place] 12/11/2018 1954   HGBUR SMALL (A) 12/11/2018 [place] NEGATIVE 12/11/2018 1954   BILIRUBINUR Negative 02/02/2018 [place] 12/11/2018 1954   PROTEINUR 100 (A) 12/11/2018 1954   UROBILINOGEN 0.2 03/21/2017 1431   NITRITE NEGATIVE 12/11/2018 1954   LEUKOCYTESUR MODERATE (A) 12/11/2018 1954    I have reviewed the labs.   Assessment & Plan:    1. Phimosis Suspect that the penile swelling that he is referring to is urine getting trapped in the foreskin  as no penile swelling was seen today during the exam  2. Balanoposthitis Prescribed Nystatin cream to apply BID RTC in one week for recheck   No follow-ups on file.  These notes generated with voice recognition software. I apologize for typographical errors.  [name], PA-C  [facility] [address]  [place], [place] [address] [phone]

## 2019-01-22 DEATH — deceased

## 2019-01-25 ENCOUNTER — Ambulatory Visit: Admission: RE | Admit: 2019-01-25 | Payer: Medicare Other | Source: Ambulatory Visit

## 2019-01-27 NOTE — Progress Notes (Signed)
  [place]  [phone]) [phone] Fax:(336) [phone]  ID: [name]: 10-09-1935  MR#: [id number]  OMB#:[id number]    Lloyd Huger, MD   01/27/2019 7:24 AM     This encounter was created in error - please disregard.

## 2019-01-29 ENCOUNTER — Inpatient Hospital Stay: Payer: Medicare Other | Admitting: Oncology

## 2019-01-29 ENCOUNTER — Inpatient Hospital Stay: Payer: Medicare Other | Admitting: Hospice and Palliative Medicine

## 2019-01-29 ENCOUNTER — Encounter: Payer: Self-pay | Admitting: Oncology

## 2019-02-12 ENCOUNTER — Other Ambulatory Visit: Payer: Self-pay | Admitting: Family Medicine

## 2019-02-12 DIAGNOSIS — G4709 Other insomnia: Secondary | ICD-10-CM

## 2019-02-27 ENCOUNTER — Ambulatory Visit: Payer: Medicare Other | Admitting: Family Medicine

## 2019-08-15 ENCOUNTER — Ambulatory Visit: Payer: Medicare Other | Admitting: Radiation Oncology
